# Patient Record
Sex: Female | Born: 1990 | Race: Black or African American | Hispanic: No | Marital: Single | Smoking: Never smoker
Health system: Southern US, Community
[De-identification: ages and names within clinical notes are randomized; demographics above are authoritative.]

## PROBLEM LIST (undated history)

## (undated) ENCOUNTER — Ambulatory Visit

## (undated) DIAGNOSIS — Z5189 Encounter for other specified aftercare: Secondary | ICD-10-CM

## (undated) DIAGNOSIS — R112 Nausea with vomiting, unspecified: Secondary | ICD-10-CM

## (undated) DIAGNOSIS — R251 Tremor, unspecified: Secondary | ICD-10-CM

## (undated) DIAGNOSIS — K56609 Unspecified intestinal obstruction, unspecified as to partial versus complete obstruction: Secondary | ICD-10-CM

## (undated) DIAGNOSIS — B3781 Candidal esophagitis: Secondary | ICD-10-CM

## (undated) DIAGNOSIS — I10 Essential (primary) hypertension: Secondary | ICD-10-CM

## (undated) DIAGNOSIS — N179 Acute kidney failure, unspecified: Secondary | ICD-10-CM

## (undated) DIAGNOSIS — B009 Herpesviral infection, unspecified: Secondary | ICD-10-CM

## (undated) DIAGNOSIS — K219 Gastro-esophageal reflux disease without esophagitis: Secondary | ICD-10-CM

## (undated) DIAGNOSIS — K3184 Gastroparesis: Secondary | ICD-10-CM

## (undated) DIAGNOSIS — D649 Anemia, unspecified: Secondary | ICD-10-CM

## (undated) DIAGNOSIS — Z9889 Other specified postprocedural states: Secondary | ICD-10-CM

## (undated) DIAGNOSIS — K5090 Crohn's disease, unspecified, without complications: Secondary | ICD-10-CM

## (undated) DIAGNOSIS — K611 Rectal abscess: Secondary | ICD-10-CM

## (undated) DIAGNOSIS — I471 Supraventricular tachycardia: Secondary | ICD-10-CM

## (undated) DIAGNOSIS — F419 Anxiety disorder, unspecified: Secondary | ICD-10-CM

## (undated) DIAGNOSIS — K603 Anal fistula, unspecified: Secondary | ICD-10-CM

## (undated) DIAGNOSIS — Z9289 Personal history of other medical treatment: Secondary | ICD-10-CM

## (undated) DIAGNOSIS — A4902 Methicillin resistant Staphylococcus aureus infection, unspecified site: Secondary | ICD-10-CM

## (undated) DIAGNOSIS — F329 Major depressive disorder, single episode, unspecified: Secondary | ICD-10-CM

## (undated) DIAGNOSIS — N83202 Unspecified ovarian cyst, left side: Secondary | ICD-10-CM

## (undated) DIAGNOSIS — T7840XA Allergy, unspecified, initial encounter: Secondary | ICD-10-CM

## (undated) DIAGNOSIS — D689 Coagulation defect, unspecified: Secondary | ICD-10-CM

## (undated) DIAGNOSIS — IMO0001 Reserved for inherently not codable concepts without codable children: Secondary | ICD-10-CM

## (undated) DIAGNOSIS — I82409 Acute embolism and thrombosis of unspecified deep veins of unspecified lower extremity: Secondary | ICD-10-CM

## (undated) HISTORY — DX: Encounter for other specified aftercare: Z51.89

## (undated) HISTORY — DX: Gastroparesis: K31.84

## (undated) HISTORY — DX: Anal fistula, unspecified: K60.30

## (undated) HISTORY — DX: Unspecified ovarian cyst, left side: N83.202

## (undated) HISTORY — DX: Coagulation defect, unspecified: D68.9

## (undated) HISTORY — DX: Anal fistula: K60.3

## (undated) HISTORY — DX: Unspecified intestinal obstruction, unspecified as to partial versus complete obstruction: K56.609

## (undated) HISTORY — DX: Allergy, unspecified, initial encounter: T78.40XA

## (undated) HISTORY — DX: Supraventricular tachycardia: I47.1

## (undated) HISTORY — DX: Candidal esophagitis: B37.81

## (undated) HISTORY — DX: Gastro-esophageal reflux disease without esophagitis: K21.9

## (undated) HISTORY — DX: Major depressive disorder, single episode, unspecified: F32.9

## (undated) HISTORY — DX: Reserved for inherently not codable concepts without codable children: IMO0001

## (undated) HISTORY — DX: Acute embolism and thrombosis of unspecified deep veins of unspecified lower extremity: I82.409

## (undated) HISTORY — PX: APPENDECTOMY: SHX54

## (undated) HISTORY — DX: Acute kidney failure, unspecified: N17.9

## (undated) HISTORY — DX: Anxiety disorder, unspecified: F41.9

## (undated) HISTORY — DX: Essential (primary) hypertension: I10

## (undated) HISTORY — DX: Herpesviral infection, unspecified: B00.9

## (undated) HISTORY — PX: UPPER GASTROINTESTINAL ENDOSCOPY: SHX188

## (undated) HISTORY — DX: Methicillin resistant Staphylococcus aureus infection, unspecified site: A49.02

## (undated) HISTORY — DX: Tremor, unspecified: R25.1

## (undated) HISTORY — DX: Anemia, unspecified: D64.9

---
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
## 2008-01-05 ENCOUNTER — Encounter: Payer: Self-pay | Admitting: Internal Medicine

## 2008-01-17 ENCOUNTER — Encounter: Payer: Self-pay | Admitting: Internal Medicine

## 2008-01-17 ENCOUNTER — Emergency Department (HOSPITAL_COMMUNITY): Admission: EM | Admit: 2008-01-17 | Discharge: 2008-01-17 | Payer: Self-pay | Admitting: Emergency Medicine

## 2008-01-17 ENCOUNTER — Telehealth: Payer: Self-pay | Admitting: Internal Medicine

## 2008-01-18 ENCOUNTER — Ambulatory Visit: Payer: Self-pay | Admitting: Cardiovascular Disease

## 2008-01-19 ENCOUNTER — Telehealth: Payer: Self-pay | Admitting: Internal Medicine

## 2008-01-25 DIAGNOSIS — N390 Urinary tract infection, site not specified: Secondary | ICD-10-CM

## 2008-01-26 ENCOUNTER — Ambulatory Visit: Payer: Self-pay | Admitting: Internal Medicine

## 2008-01-27 ENCOUNTER — Telehealth: Payer: Self-pay | Admitting: Internal Medicine

## 2008-01-27 ENCOUNTER — Encounter: Payer: Self-pay | Admitting: Internal Medicine

## 2008-01-31 ENCOUNTER — Encounter: Payer: Self-pay | Admitting: Internal Medicine

## 2008-01-31 ENCOUNTER — Ambulatory Visit: Payer: Self-pay | Admitting: Internal Medicine

## 2008-02-02 ENCOUNTER — Encounter: Payer: Self-pay | Admitting: Internal Medicine

## 2008-02-09 ENCOUNTER — Encounter (INDEPENDENT_AMBULATORY_CARE_PROVIDER_SITE_OTHER): Payer: Self-pay | Admitting: *Deleted

## 2008-02-09 ENCOUNTER — Encounter: Payer: Self-pay | Admitting: Internal Medicine

## 2008-02-09 ENCOUNTER — Inpatient Hospital Stay (HOSPITAL_COMMUNITY): Admission: EM | Admit: 2008-02-09 | Discharge: 2008-02-11 | Payer: Self-pay | Admitting: Emergency Medicine

## 2008-02-10 ENCOUNTER — Encounter: Payer: Self-pay | Admitting: Internal Medicine

## 2008-02-10 ENCOUNTER — Encounter (INDEPENDENT_AMBULATORY_CARE_PROVIDER_SITE_OTHER): Payer: Self-pay | Admitting: General Surgery

## 2008-02-11 ENCOUNTER — Encounter (INDEPENDENT_AMBULATORY_CARE_PROVIDER_SITE_OTHER): Payer: Self-pay | Admitting: *Deleted

## 2008-02-15 ENCOUNTER — Ambulatory Visit: Payer: Self-pay | Admitting: Internal Medicine

## 2008-02-21 ENCOUNTER — Telehealth: Payer: Self-pay | Admitting: Internal Medicine

## 2008-03-15 ENCOUNTER — Encounter: Payer: Self-pay | Admitting: Internal Medicine

## 2008-03-17 ENCOUNTER — Ambulatory Visit: Payer: Self-pay | Admitting: Internal Medicine

## 2008-03-27 ENCOUNTER — Ambulatory Visit: Payer: Self-pay | Admitting: Internal Medicine

## 2008-03-31 ENCOUNTER — Telehealth: Payer: Self-pay | Admitting: Internal Medicine

## 2008-04-13 ENCOUNTER — Telehealth: Payer: Self-pay | Admitting: Internal Medicine

## 2008-04-18 ENCOUNTER — Telehealth: Payer: Self-pay | Admitting: Internal Medicine

## 2008-05-18 ENCOUNTER — Telehealth: Payer: Self-pay | Admitting: Internal Medicine

## 2008-05-22 ENCOUNTER — Telehealth: Payer: Self-pay | Admitting: Internal Medicine

## 2008-05-22 ENCOUNTER — Ambulatory Visit: Payer: Self-pay | Admitting: Internal Medicine

## 2008-05-23 ENCOUNTER — Telehealth: Payer: Self-pay | Admitting: Internal Medicine

## 2008-05-23 LAB — CONVERTED CEMR LAB
Albumin: 3.9 g/dL (ref 3.5–5.2)
Alkaline Phosphatase: 51 units/L (ref 39–117)
BUN: 6 mg/dL (ref 6–23)
CO2: 27 meq/L (ref 19–32)
Eosinophils Relative: 0.9 % (ref 0.0–5.0)
GFR calc Af Amer: 139 mL/min
GFR calc non Af Amer: 115 mL/min
Glucose, Bld: 92 mg/dL (ref 70–99)
HCT: 36.7 % (ref 36.0–46.0)
Hemoglobin: 12.4 g/dL (ref 12.0–15.0)
Lymphocytes Relative: 39 % (ref 12.0–46.0)
Monocytes Absolute: 0.3 10*3/uL (ref 0.1–1.0)
Monocytes Relative: 6.2 % (ref 3.0–12.0)
Neutro Abs: 3 10*3/uL (ref 1.4–7.7)
Platelets: 287 10*3/uL (ref 150–400)
Potassium: 4.4 meq/L (ref 3.5–5.1)
Sed Rate: 29 mm/hr — ABNORMAL HIGH (ref 0–22)
Sodium: 145 meq/L (ref 135–145)
Total Protein: 7.1 g/dL (ref 6.0–8.3)
WBC: 5.4 10*3/uL (ref 4.5–10.5)

## 2008-05-31 ENCOUNTER — Telehealth: Payer: Self-pay | Admitting: Internal Medicine

## 2008-06-01 ENCOUNTER — Encounter (INDEPENDENT_AMBULATORY_CARE_PROVIDER_SITE_OTHER): Payer: Self-pay | Admitting: *Deleted

## 2008-06-01 ENCOUNTER — Emergency Department (HOSPITAL_COMMUNITY): Admission: EM | Admit: 2008-06-01 | Discharge: 2008-06-01 | Payer: Self-pay | Admitting: Emergency Medicine

## 2008-06-23 ENCOUNTER — Telehealth: Payer: Self-pay | Admitting: Internal Medicine

## 2008-06-23 ENCOUNTER — Encounter (INDEPENDENT_AMBULATORY_CARE_PROVIDER_SITE_OTHER): Payer: Self-pay | Admitting: *Deleted

## 2008-06-26 ENCOUNTER — Ambulatory Visit: Payer: Self-pay | Admitting: Internal Medicine

## 2008-08-07 ENCOUNTER — Telehealth: Payer: Self-pay | Admitting: Internal Medicine

## 2008-08-25 ENCOUNTER — Telehealth: Payer: Self-pay | Admitting: Internal Medicine

## 2008-09-01 ENCOUNTER — Ambulatory Visit: Payer: Self-pay | Admitting: Internal Medicine

## 2008-09-20 ENCOUNTER — Telehealth: Payer: Self-pay | Admitting: Internal Medicine

## 2008-09-20 ENCOUNTER — Encounter: Payer: Self-pay | Admitting: Internal Medicine

## 2008-10-15 ENCOUNTER — Emergency Department (HOSPITAL_COMMUNITY): Admission: EM | Admit: 2008-10-15 | Discharge: 2008-10-15 | Payer: Self-pay | Admitting: Emergency Medicine

## 2008-11-01 ENCOUNTER — Emergency Department (HOSPITAL_COMMUNITY): Admission: EM | Admit: 2008-11-01 | Discharge: 2008-11-01 | Payer: Self-pay | Admitting: Internal Medicine

## 2008-11-07 ENCOUNTER — Telehealth: Payer: Self-pay | Admitting: Internal Medicine

## 2008-12-06 ENCOUNTER — Telehealth: Payer: Self-pay | Admitting: Internal Medicine

## 2008-12-19 ENCOUNTER — Ambulatory Visit: Payer: Self-pay | Admitting: Internal Medicine

## 2008-12-19 DIAGNOSIS — A6000 Herpesviral infection of urogenital system, unspecified: Secondary | ICD-10-CM | POA: Insufficient documentation

## 2008-12-20 ENCOUNTER — Ambulatory Visit: Payer: Self-pay | Admitting: Internal Medicine

## 2008-12-20 ENCOUNTER — Encounter: Payer: Self-pay | Admitting: Internal Medicine

## 2008-12-20 LAB — CONVERTED CEMR LAB
ALT: 14 units/L (ref 0–35)
AST: 20 units/L (ref 0–37)
Albumin: 3.4 g/dL — ABNORMAL LOW (ref 3.5–5.2)
Basophils Absolute: 0 10*3/uL (ref 0.0–0.1)
CO2: 29 meq/L (ref 19–32)
Calcium: 8.8 mg/dL (ref 8.4–10.5)
Chloride: 111 meq/L (ref 96–112)
Creatinine, Ser: 0.6 mg/dL (ref 0.4–1.2)
Eosinophils Absolute: 0 10*3/uL (ref 0.0–0.7)
GFR calc non Af Amer: 164.12 mL/min (ref >60)
Hemoglobin: 12.1 g/dL (ref 12.0–15.0)
Iron: 22 ug/dL — ABNORMAL LOW (ref 42–145)
Lymphocytes Relative: 29.4 % (ref 12.0–46.0)
MCHC: 34.1 g/dL (ref 30.0–36.0)
Monocytes Relative: 10 % (ref 3.0–12.0)
Neutrophils Relative %: 59.6 % (ref 43.0–77.0)
Platelets: 343 10*3/uL (ref 150.0–400.0)
Potassium: 3.7 meq/L (ref 3.5–5.1)
RDW: 13.9 % (ref 11.5–14.6)
Sed Rate: 31 mm/hr — ABNORMAL HIGH (ref 0–22)
Sodium: 142 meq/L (ref 135–145)
Total Protein: 6.8 g/dL (ref 6.0–8.3)

## 2008-12-22 ENCOUNTER — Encounter: Payer: Self-pay | Admitting: Internal Medicine

## 2009-02-09 ENCOUNTER — Ambulatory Visit: Payer: Self-pay | Admitting: Internal Medicine

## 2009-02-19 ENCOUNTER — Encounter: Payer: Self-pay | Admitting: Internal Medicine

## 2009-03-16 ENCOUNTER — Telehealth: Payer: Self-pay | Admitting: Internal Medicine

## 2009-03-20 ENCOUNTER — Telehealth: Payer: Self-pay | Admitting: Internal Medicine

## 2009-03-22 ENCOUNTER — Ambulatory Visit: Payer: Self-pay | Admitting: Internal Medicine

## 2009-03-22 ENCOUNTER — Encounter: Payer: Self-pay | Admitting: Internal Medicine

## 2009-03-22 LAB — CONVERTED CEMR LAB
ALT: 15 units/L (ref 0–35)
AST: 18 units/L (ref 0–37)
BUN: 8 mg/dL (ref 6–23)
Basophils Relative: 1.4 % (ref 0.0–3.0)
Bilirubin, Direct: 0.1 mg/dL (ref 0.0–0.3)
Calcium: 8.8 mg/dL (ref 8.4–10.5)
Eosinophils Relative: 0.8 % (ref 0.0–5.0)
GFR calc non Af Amer: 137.02 mL/min (ref >60)
Glucose, Bld: 95 mg/dL (ref 70–99)
HCT: 34.4 % — ABNORMAL LOW (ref 36.0–46.0)
Lymphs Abs: 1.4 10*3/uL (ref 0.7–4.0)
Monocytes Relative: 7.5 % (ref 3.0–12.0)
Neutrophils Relative %: 67 % (ref 43.0–77.0)
Platelets: 354 10*3/uL (ref 150.0–400.0)
RBC: 3.91 M/uL (ref 3.87–5.11)
Total Bilirubin: 0.5 mg/dL (ref 0.3–1.2)
Total Protein: 6.6 g/dL (ref 6.0–8.3)
WBC: 6.2 10*3/uL (ref 4.5–10.5)

## 2009-07-14 DIAGNOSIS — I471 Supraventricular tachycardia, unspecified: Secondary | ICD-10-CM

## 2009-07-14 DIAGNOSIS — Z9289 Personal history of other medical treatment: Secondary | ICD-10-CM

## 2009-07-14 DIAGNOSIS — F419 Anxiety disorder, unspecified: Secondary | ICD-10-CM

## 2009-07-14 DIAGNOSIS — I10 Essential (primary) hypertension: Secondary | ICD-10-CM

## 2009-07-14 DIAGNOSIS — F32A Depression, unspecified: Secondary | ICD-10-CM

## 2009-07-14 DIAGNOSIS — K56609 Unspecified intestinal obstruction, unspecified as to partial versus complete obstruction: Secondary | ICD-10-CM

## 2009-07-14 HISTORY — DX: Supraventricular tachycardia: I47.1

## 2009-07-14 HISTORY — DX: Depression, unspecified: F32.A

## 2009-07-14 HISTORY — DX: Personal history of other medical treatment: Z92.89

## 2009-07-14 HISTORY — DX: Anxiety disorder, unspecified: F41.9

## 2009-07-14 HISTORY — DX: Essential (primary) hypertension: I10

## 2009-07-14 HISTORY — DX: Supraventricular tachycardia, unspecified: I47.10

## 2009-07-14 HISTORY — DX: Unspecified intestinal obstruction, unspecified as to partial versus complete obstruction: K56.609

## 2009-07-14 HISTORY — PX: OTHER SURGICAL HISTORY: SHX169

## 2009-09-19 ENCOUNTER — Ambulatory Visit: Payer: Self-pay | Admitting: Internal Medicine

## 2009-09-19 ENCOUNTER — Telehealth: Payer: Self-pay | Admitting: Internal Medicine

## 2009-09-19 LAB — CONVERTED CEMR LAB
ALT: 16 units/L (ref 0–35)
Basophils Absolute: 0 10*3/uL (ref 0.0–0.1)
CO2: 30 meq/L (ref 19–32)
Calcium: 9 mg/dL (ref 8.4–10.5)
Chloride: 109 meq/L (ref 96–112)
GFR calc non Af Amer: 162.89 mL/min (ref >60)
Lymphocytes Relative: 14.5 % (ref 12.0–46.0)
Monocytes Relative: 6.6 % (ref 3.0–12.0)
Neutrophils Relative %: 77.6 % — ABNORMAL HIGH (ref 43.0–77.0)
Platelets: 411 10*3/uL — ABNORMAL HIGH (ref 150.0–400.0)
Potassium: 3.7 meq/L (ref 3.5–5.1)
RDW: 15.4 % — ABNORMAL HIGH (ref 11.5–14.6)
Sed Rate: 60 mm/hr — ABNORMAL HIGH (ref 0–22)
Sodium: 141 meq/L (ref 135–145)
Total Bilirubin: 0.2 mg/dL — ABNORMAL LOW (ref 0.3–1.2)
Total Protein: 7.3 g/dL (ref 6.0–8.3)

## 2009-09-21 ENCOUNTER — Ambulatory Visit: Payer: Self-pay | Admitting: Oncology

## 2009-10-12 ENCOUNTER — Encounter: Payer: Self-pay | Admitting: Internal Medicine

## 2009-10-12 LAB — CBC WITH DIFFERENTIAL/PLATELET
BASO%: 0.3 % (ref 0.0–2.0)
EOS%: 3.8 % (ref 0.0–7.0)
HCT: 35.3 % (ref 34.8–46.6)
MCH: 26.7 pg (ref 25.1–34.0)
MCHC: 32.5 g/dL (ref 31.5–36.0)
NEUT%: 67.7 % (ref 38.4–76.8)
RBC: 4.28 10*6/uL (ref 3.70–5.45)
RDW: 16.4 % — ABNORMAL HIGH (ref 11.2–14.5)
WBC: 5.4 10*3/uL (ref 3.9–10.3)
lymph#: 1.1 10*3/uL (ref 0.9–3.3)

## 2009-10-12 LAB — CHCC SMEAR

## 2009-10-14 LAB — TRANSFERRIN RECEPTOR, SOLUABLE: Transferrin Receptor, Soluble: 45.1 nmol/L

## 2009-10-14 LAB — VITAMIN B12: Vitamin B-12: 499 pg/mL (ref 211–911)

## 2009-10-14 LAB — IRON AND TIBC: Iron: 23 ug/dL — ABNORMAL LOW (ref 42–145)

## 2009-10-26 ENCOUNTER — Telehealth: Payer: Self-pay | Admitting: Internal Medicine

## 2009-11-05 ENCOUNTER — Telehealth: Payer: Self-pay | Admitting: Internal Medicine

## 2009-12-07 ENCOUNTER — Ambulatory Visit: Payer: Self-pay | Admitting: Internal Medicine

## 2009-12-19 ENCOUNTER — Telehealth: Payer: Self-pay | Admitting: Internal Medicine

## 2009-12-20 ENCOUNTER — Ambulatory Visit: Payer: Self-pay | Admitting: Gastroenterology

## 2010-01-04 ENCOUNTER — Telehealth: Payer: Self-pay | Admitting: Internal Medicine

## 2010-01-15 ENCOUNTER — Ambulatory Visit: Payer: Self-pay | Admitting: Oncology

## 2010-01-17 ENCOUNTER — Telehealth: Payer: Self-pay | Admitting: Internal Medicine

## 2010-01-18 ENCOUNTER — Ambulatory Visit: Payer: Self-pay | Admitting: Internal Medicine

## 2010-02-14 ENCOUNTER — Ambulatory Visit: Payer: Self-pay | Admitting: Oncology

## 2010-02-15 ENCOUNTER — Ambulatory Visit: Payer: Self-pay | Admitting: Gastroenterology

## 2010-02-18 ENCOUNTER — Telehealth: Payer: Self-pay | Admitting: Internal Medicine

## 2010-02-18 ENCOUNTER — Encounter: Payer: Self-pay | Admitting: Internal Medicine

## 2010-02-18 LAB — CBC WITH DIFFERENTIAL/PLATELET
BASO%: 0.3 % (ref 0.0–2.0)
EOS%: 1.8 % (ref 0.0–7.0)
HCT: 32.5 % — ABNORMAL LOW (ref 34.8–46.6)
LYMPH%: 18.9 % (ref 14.0–49.7)
MCH: 25.6 pg (ref 25.1–34.0)
MCHC: 32.3 g/dL (ref 31.5–36.0)
MCV: 79.4 fL — ABNORMAL LOW (ref 79.5–101.0)
MONO%: 7.7 % (ref 0.0–14.0)
NEUT%: 71.3 % (ref 38.4–76.8)
Platelets: 451 10*3/uL — ABNORMAL HIGH (ref 145–400)
RBC: 4.09 10*6/uL (ref 3.70–5.45)

## 2010-02-19 ENCOUNTER — Encounter (INDEPENDENT_AMBULATORY_CARE_PROVIDER_SITE_OTHER): Payer: Self-pay | Admitting: *Deleted

## 2010-02-20 ENCOUNTER — Ambulatory Visit: Payer: Self-pay | Admitting: Internal Medicine

## 2010-02-28 ENCOUNTER — Ambulatory Visit: Payer: Self-pay | Admitting: Internal Medicine

## 2010-02-28 LAB — HM SIGMOIDOSCOPY

## 2010-03-01 ENCOUNTER — Encounter: Payer: Self-pay | Admitting: Internal Medicine

## 2010-03-04 ENCOUNTER — Encounter: Payer: Self-pay | Admitting: Internal Medicine

## 2010-04-04 ENCOUNTER — Emergency Department (HOSPITAL_COMMUNITY): Admission: EM | Admit: 2010-04-04 | Discharge: 2010-04-04 | Payer: Self-pay | Admitting: Emergency Medicine

## 2010-04-04 ENCOUNTER — Encounter: Payer: Self-pay | Admitting: Internal Medicine

## 2010-04-05 ENCOUNTER — Telehealth: Payer: Self-pay | Admitting: Internal Medicine

## 2010-04-05 ENCOUNTER — Encounter: Payer: Self-pay | Admitting: Internal Medicine

## 2010-04-12 ENCOUNTER — Ambulatory Visit: Payer: Self-pay | Admitting: Internal Medicine

## 2010-04-12 ENCOUNTER — Inpatient Hospital Stay (HOSPITAL_COMMUNITY): Admission: AD | Admit: 2010-04-12 | Discharge: 2010-04-18 | Payer: Self-pay | Admitting: Internal Medicine

## 2010-04-13 ENCOUNTER — Encounter (INDEPENDENT_AMBULATORY_CARE_PROVIDER_SITE_OTHER): Payer: Self-pay | Admitting: *Deleted

## 2010-04-13 ENCOUNTER — Encounter: Payer: Self-pay | Admitting: Internal Medicine

## 2010-04-13 DIAGNOSIS — A4902 Methicillin resistant Staphylococcus aureus infection, unspecified site: Secondary | ICD-10-CM

## 2010-04-13 HISTORY — DX: Methicillin resistant Staphylococcus aureus infection, unspecified site: A49.02

## 2010-04-18 ENCOUNTER — Encounter: Payer: Self-pay | Admitting: Internal Medicine

## 2010-04-19 ENCOUNTER — Telehealth: Payer: Self-pay | Admitting: Internal Medicine

## 2010-04-19 ENCOUNTER — Encounter: Payer: Self-pay | Admitting: Internal Medicine

## 2010-04-22 ENCOUNTER — Telehealth: Payer: Self-pay | Admitting: Internal Medicine

## 2010-04-22 ENCOUNTER — Inpatient Hospital Stay (HOSPITAL_COMMUNITY)
Admission: AD | Admit: 2010-04-22 | Discharge: 2010-05-28 | Payer: Self-pay | Source: Home / Self Care | Admitting: Internal Medicine

## 2010-04-22 ENCOUNTER — Ambulatory Visit: Payer: Self-pay | Admitting: Internal Medicine

## 2010-04-22 ENCOUNTER — Ambulatory Visit: Payer: Self-pay | Admitting: Cardiovascular Disease

## 2010-04-29 ENCOUNTER — Encounter (INDEPENDENT_AMBULATORY_CARE_PROVIDER_SITE_OTHER): Payer: Self-pay | Admitting: *Deleted

## 2010-04-30 ENCOUNTER — Encounter (INDEPENDENT_AMBULATORY_CARE_PROVIDER_SITE_OTHER): Payer: Self-pay | Admitting: *Deleted

## 2010-05-01 ENCOUNTER — Encounter: Payer: Self-pay | Admitting: Internal Medicine

## 2010-05-02 ENCOUNTER — Encounter (INDEPENDENT_AMBULATORY_CARE_PROVIDER_SITE_OTHER): Payer: Self-pay | Admitting: *Deleted

## 2010-05-02 ENCOUNTER — Encounter (INDEPENDENT_AMBULATORY_CARE_PROVIDER_SITE_OTHER): Payer: Self-pay

## 2010-05-03 ENCOUNTER — Telehealth: Payer: Self-pay | Admitting: Internal Medicine

## 2010-05-05 ENCOUNTER — Encounter (INDEPENDENT_AMBULATORY_CARE_PROVIDER_SITE_OTHER): Payer: Self-pay | Admitting: *Deleted

## 2010-05-06 ENCOUNTER — Encounter (INDEPENDENT_AMBULATORY_CARE_PROVIDER_SITE_OTHER): Payer: Self-pay | Admitting: *Deleted

## 2010-05-06 ENCOUNTER — Encounter: Payer: Self-pay | Admitting: Cardiovascular Disease

## 2010-05-12 ENCOUNTER — Encounter (INDEPENDENT_AMBULATORY_CARE_PROVIDER_SITE_OTHER): Payer: Self-pay | Admitting: *Deleted

## 2010-05-14 ENCOUNTER — Encounter (INDEPENDENT_AMBULATORY_CARE_PROVIDER_SITE_OTHER): Payer: Self-pay | Admitting: *Deleted

## 2010-05-16 ENCOUNTER — Encounter (INDEPENDENT_AMBULATORY_CARE_PROVIDER_SITE_OTHER): Payer: Self-pay | Admitting: *Deleted

## 2010-05-17 ENCOUNTER — Ambulatory Visit: Payer: Self-pay | Admitting: Oncology

## 2010-05-18 ENCOUNTER — Encounter (INDEPENDENT_AMBULATORY_CARE_PROVIDER_SITE_OTHER): Payer: Self-pay | Admitting: *Deleted

## 2010-05-20 ENCOUNTER — Encounter (INDEPENDENT_AMBULATORY_CARE_PROVIDER_SITE_OTHER): Payer: Self-pay | Admitting: *Deleted

## 2010-05-25 ENCOUNTER — Encounter (INDEPENDENT_AMBULATORY_CARE_PROVIDER_SITE_OTHER): Payer: Self-pay | Admitting: *Deleted

## 2010-05-28 ENCOUNTER — Telehealth: Payer: Self-pay | Admitting: Internal Medicine

## 2010-05-29 ENCOUNTER — Telehealth: Payer: Self-pay | Admitting: Internal Medicine

## 2010-05-31 ENCOUNTER — Encounter: Payer: Self-pay | Admitting: Internal Medicine

## 2010-05-31 LAB — CONVERTED CEMR LAB
Hemoglobin: 11.2 g/dL
Platelets: 450 10*3/uL

## 2010-06-04 ENCOUNTER — Telehealth: Payer: Self-pay | Admitting: Internal Medicine

## 2010-06-04 ENCOUNTER — Encounter (INDEPENDENT_AMBULATORY_CARE_PROVIDER_SITE_OTHER): Payer: Self-pay | Admitting: *Deleted

## 2010-06-05 ENCOUNTER — Ambulatory Visit: Payer: Self-pay | Admitting: Psychiatry

## 2010-06-05 ENCOUNTER — Inpatient Hospital Stay (HOSPITAL_COMMUNITY)
Admission: EM | Admit: 2010-06-05 | Discharge: 2010-06-06 | Payer: Self-pay | Source: Home / Self Care | Admitting: Emergency Medicine

## 2010-06-05 ENCOUNTER — Ambulatory Visit: Payer: Self-pay | Admitting: Internal Medicine

## 2010-06-05 ENCOUNTER — Ambulatory Visit: Payer: Self-pay | Admitting: Gastroenterology

## 2010-06-12 ENCOUNTER — Encounter: Payer: Self-pay | Admitting: Internal Medicine

## 2010-06-12 ENCOUNTER — Ambulatory Visit: Payer: Self-pay | Admitting: Cardiovascular Disease

## 2010-06-13 ENCOUNTER — Telehealth: Payer: Self-pay | Admitting: Internal Medicine

## 2010-06-17 ENCOUNTER — Telehealth: Payer: Self-pay | Admitting: Internal Medicine

## 2010-06-19 ENCOUNTER — Encounter: Payer: Self-pay | Admitting: Internal Medicine

## 2010-06-20 ENCOUNTER — Emergency Department (HOSPITAL_COMMUNITY): Admission: EM | Admit: 2010-06-20 | Discharge: 2009-08-12 | Payer: Self-pay | Admitting: Emergency Medicine

## 2010-06-21 ENCOUNTER — Encounter (INDEPENDENT_AMBULATORY_CARE_PROVIDER_SITE_OTHER): Payer: Self-pay | Admitting: *Deleted

## 2010-06-21 ENCOUNTER — Telehealth: Payer: Self-pay | Admitting: Internal Medicine

## 2010-06-21 ENCOUNTER — Emergency Department (HOSPITAL_COMMUNITY)
Admission: EM | Admit: 2010-06-21 | Discharge: 2010-06-22 | Payer: Self-pay | Source: Home / Self Care | Admitting: Emergency Medicine

## 2010-06-22 ENCOUNTER — Encounter (INDEPENDENT_AMBULATORY_CARE_PROVIDER_SITE_OTHER): Payer: Self-pay | Admitting: *Deleted

## 2010-06-26 ENCOUNTER — Telehealth: Payer: Self-pay | Admitting: Internal Medicine

## 2010-06-27 ENCOUNTER — Ambulatory Visit: Payer: Self-pay | Admitting: Internal Medicine

## 2010-06-27 LAB — CONVERTED CEMR LAB
Basophils Relative: 0.2 % (ref 0.0–3.0)
Eosinophils Absolute: 0.3 10*3/uL (ref 0.0–0.7)
Eosinophils Relative: 3.4 % (ref 0.0–5.0)
Hemoglobin: 11.2 g/dL — ABNORMAL LOW (ref 12.0–15.0)
Lymphs Abs: 2.6 10*3/uL (ref 0.7–4.0)
Monocytes Absolute: 0.8 10*3/uL (ref 0.1–1.0)
Neutrophils Relative %: 57.1 % (ref 43.0–77.0)
Platelets: 449 10*3/uL — ABNORMAL HIGH (ref 150.0–400.0)
RDW: 20.3 % — ABNORMAL HIGH (ref 11.5–14.6)
Transferrin: 455.4 mg/dL — ABNORMAL HIGH (ref 212.0–360.0)

## 2010-07-04 ENCOUNTER — Emergency Department (HOSPITAL_COMMUNITY)
Admission: EM | Admit: 2010-07-04 | Discharge: 2010-07-04 | Payer: Self-pay | Source: Home / Self Care | Admitting: Emergency Medicine

## 2010-07-04 ENCOUNTER — Telehealth: Payer: Self-pay | Admitting: Internal Medicine

## 2010-07-12 ENCOUNTER — Encounter: Payer: Self-pay | Admitting: Internal Medicine

## 2010-07-16 ENCOUNTER — Telehealth: Payer: Self-pay | Admitting: Internal Medicine

## 2010-07-16 ENCOUNTER — Ambulatory Visit
Admission: RE | Admit: 2010-07-16 | Discharge: 2010-07-16 | Payer: Self-pay | Source: Home / Self Care | Attending: Internal Medicine | Admitting: Internal Medicine

## 2010-07-17 ENCOUNTER — Encounter: Payer: Self-pay | Admitting: Internal Medicine

## 2010-07-17 ENCOUNTER — Encounter
Admission: RE | Admit: 2010-07-17 | Discharge: 2010-07-17 | Payer: Self-pay | Source: Home / Self Care | Attending: Surgery | Admitting: Surgery

## 2010-07-22 DIAGNOSIS — Z432 Encounter for attention to ileostomy: Secondary | ICD-10-CM

## 2010-07-22 DIAGNOSIS — Z48815 Encounter for surgical aftercare following surgery on the digestive system: Secondary | ICD-10-CM

## 2010-07-22 DIAGNOSIS — Z48814 Encounter for surgical aftercare following surgery on the teeth or oral cavity: Secondary | ICD-10-CM

## 2010-07-22 DIAGNOSIS — E119 Type 2 diabetes mellitus without complications: Secondary | ICD-10-CM

## 2010-07-25 ENCOUNTER — Encounter (INDEPENDENT_AMBULATORY_CARE_PROVIDER_SITE_OTHER): Payer: Self-pay | Admitting: Surgery

## 2010-07-25 ENCOUNTER — Encounter: Payer: Self-pay | Admitting: Internal Medicine

## 2010-07-25 ENCOUNTER — Inpatient Hospital Stay (HOSPITAL_COMMUNITY)
Admission: RE | Admit: 2010-07-25 | Discharge: 2010-08-27 | DRG: 344 | Disposition: A | Discharge status: Home / Self Care | Payer: Self-pay | Attending: Surgery | Admitting: Surgery

## 2010-07-25 DIAGNOSIS — T8183XA Persistent postprocedural fistula, initial encounter: Secondary | ICD-10-CM | POA: Diagnosis not present

## 2010-07-25 DIAGNOSIS — D509 Iron deficiency anemia, unspecified: Secondary | ICD-10-CM | POA: Diagnosis present

## 2010-07-25 DIAGNOSIS — K651 Peritoneal abscess: Secondary | ICD-10-CM | POA: Diagnosis not present

## 2010-07-25 DIAGNOSIS — Y833 Surgical operation with formation of external stoma as the cause of abnormal reaction of the patient, or of later complication, without mention of misadventure at the time of the procedure: Secondary | ICD-10-CM | POA: Diagnosis not present

## 2010-07-25 DIAGNOSIS — K219 Gastro-esophageal reflux disease without esophagitis: Secondary | ICD-10-CM | POA: Diagnosis present

## 2010-07-25 DIAGNOSIS — E43 Unspecified severe protein-calorie malnutrition: Secondary | ICD-10-CM | POA: Diagnosis not present

## 2010-07-25 DIAGNOSIS — Z432 Encounter for attention to ileostomy: Principal | ICD-10-CM

## 2010-07-25 DIAGNOSIS — IMO0002 Reserved for concepts with insufficient information to code with codable children: Secondary | ICD-10-CM

## 2010-07-25 DIAGNOSIS — I498 Other specified cardiac arrhythmias: Secondary | ICD-10-CM | POA: Diagnosis not present

## 2010-07-25 DIAGNOSIS — K5090 Crohn's disease, unspecified, without complications: Secondary | ICD-10-CM | POA: Diagnosis present

## 2010-07-25 DIAGNOSIS — K0530 Chronic periodontitis, unspecified: Secondary | ICD-10-CM | POA: Diagnosis present

## 2010-07-25 HISTORY — DX: Crohn's disease, unspecified, without complications: K50.90

## 2010-07-26 ENCOUNTER — Encounter (INDEPENDENT_AMBULATORY_CARE_PROVIDER_SITE_OTHER): Payer: Self-pay | Admitting: *Deleted

## 2010-07-29 ENCOUNTER — Encounter (INDEPENDENT_AMBULATORY_CARE_PROVIDER_SITE_OTHER): Payer: Self-pay | Admitting: *Deleted

## 2010-07-29 LAB — DIFFERENTIAL
Basophils Absolute: 0 10*3/uL (ref 0.0–0.1)
Basophils Relative: 1 % (ref 0–1)
Eosinophils Absolute: 0.2 10*3/uL (ref 0.0–0.7)
Eosinophils Relative: 4 % (ref 0–5)
Lymphocytes Relative: 28 % (ref 12–46)
Lymphs Abs: 1.7 10*3/uL (ref 0.7–4.0)
Monocytes Absolute: 0.8 10*3/uL (ref 0.1–1.0)
Monocytes Relative: 12 % (ref 3–12)
Neutro Abs: 3.6 10*3/uL (ref 1.7–7.7)
Neutrophils Relative %: 57 % (ref 43–77)

## 2010-07-29 LAB — COMPREHENSIVE METABOLIC PANEL
ALT: 41 U/L — ABNORMAL HIGH (ref 0–35)
ALT: 37 U/L — ABNORMAL HIGH (ref 0–35)
ALT: 32 U/L (ref 0–35)
AST: 29 U/L (ref 0–37)
AST: 33 U/L (ref 0–37)
AST: 27 U/L (ref 0–37)
Albumin: 3.3 g/dL — ABNORMAL LOW (ref 3.5–5.2)
Albumin: 2.5 g/dL — ABNORMAL LOW (ref 3.5–5.2)
Albumin: 2.6 g/dL — ABNORMAL LOW (ref 3.5–5.2)
Alkaline Phosphatase: 66 U/L (ref 39–117)
Alkaline Phosphatase: 43 U/L (ref 39–117)
Alkaline Phosphatase: 51 U/L (ref 39–117)
BUN: 4 mg/dL — ABNORMAL LOW (ref 6–23)
BUN: 2 mg/dL — ABNORMAL LOW (ref 6–23)
BUN: <1 mg/dL — ABNORMAL LOW (ref 6–23)
CO2: 25 mEq/L (ref 19–32)
CO2: 28 mEq/L (ref 19–32)
CO2: 28 mEq/L (ref 19–32)
Calcium: 9.4 mg/dL (ref 8.4–10.5)
Calcium: 8.3 mg/dL — ABNORMAL LOW (ref 8.4–10.5)
Calcium: 8.5 mg/dL (ref 8.4–10.5)
Chloride: 107 mEq/L (ref 96–112)
Chloride: 104 mEq/L (ref 96–112)
Chloride: 103 mEq/L (ref 96–112)
Creatinine, Ser: 0.63 mg/dL (ref 0.4–1.2)
Creatinine, Ser: 0.58 mg/dL (ref 0.4–1.2)
Creatinine, Ser: 0.58 mg/dL (ref 0.4–1.2)
GFR calc Af Amer: >60 mL/min (ref >60)
GFR calc Af Amer: >60 mL/min (ref >60)
GFR calc Af Amer: >60 mL/min (ref >60)
GFR calc non Af Amer: >60 mL/min (ref >60)
GFR calc non Af Amer: >60 mL/min (ref >60)
GFR calc non Af Amer: >60 mL/min (ref >60)
Glucose, Bld: 84 mg/dL (ref 70–99)
Glucose, Bld: 105 mg/dL — ABNORMAL HIGH (ref 70–99)
Glucose, Bld: 94 mg/dL (ref 70–99)
Potassium: 4.2 mEq/L (ref 3.5–5.1)
Potassium: 3.3 mEq/L — ABNORMAL LOW (ref 3.5–5.1)
Potassium: 3.3 mEq/L — ABNORMAL LOW (ref 3.5–5.1)
Sodium: 139 mEq/L (ref 135–145)
Sodium: 140 mEq/L (ref 135–145)
Sodium: 139 mEq/L (ref 135–145)
Total Bilirubin: 0.3 mg/dL (ref 0.3–1.2)
Total Bilirubin: 0.3 mg/dL (ref 0.3–1.2)
Total Bilirubin: 0.3 mg/dL (ref 0.3–1.2)
Total Protein: 6.7 g/dL (ref 6.0–8.3)
Total Protein: 4.8 g/dL — ABNORMAL LOW (ref 6.0–8.3)
Total Protein: 5.2 g/dL — ABNORMAL LOW (ref 6.0–8.3)

## 2010-07-29 LAB — CBC
HCT: 33 % — ABNORMAL LOW (ref 36.0–46.0)
HCT: 26.5 % — ABNORMAL LOW (ref 36.0–46.0)
HCT: 28.7 % — ABNORMAL LOW (ref 36.0–46.0)
HCT: 30.5 % — ABNORMAL LOW (ref 36.0–46.0)
Hemoglobin: 10.1 g/dL — ABNORMAL LOW (ref 12.0–15.0)
Hemoglobin: 7.9 g/dL — ABNORMAL LOW (ref 12.0–15.0)
Hemoglobin: 8.7 g/dL — ABNORMAL LOW (ref 12.0–15.0)
Hemoglobin: 9.2 g/dL — ABNORMAL LOW (ref 12.0–15.0)
MCH: 23.5 pg — ABNORMAL LOW (ref 26.0–34.0)
MCH: 23 pg — ABNORMAL LOW (ref 26.0–34.0)
MCH: 23.6 pg — ABNORMAL LOW (ref 26.0–34.0)
MCH: 23.4 pg — ABNORMAL LOW (ref 26.0–34.0)
MCHC: 30.6 g/dL (ref 30.0–36.0)
MCHC: 29.8 g/dL — ABNORMAL LOW (ref 30.0–36.0)
MCHC: 30.3 g/dL (ref 30.0–36.0)
MCHC: 30.2 g/dL (ref 30.0–36.0)
MCV: 76.9 fL — ABNORMAL LOW (ref 78.0–100.0)
MCV: 77.3 fL — ABNORMAL LOW (ref 78.0–100.0)
MCV: 77.8 fL — ABNORMAL LOW (ref 78.0–100.0)
MCV: 77.6 fL — ABNORMAL LOW (ref 78.0–100.0)
Platelets: 444 10*3/uL — ABNORMAL HIGH (ref 150–400)
Platelets: 356 10*3/uL (ref 150–400)
Platelets: 383 10*3/uL (ref 150–400)
Platelets: 398 10*3/uL (ref 150–400)
RBC: 4.29 MIL/uL (ref 3.87–5.11)
RBC: 3.43 MIL/uL — ABNORMAL LOW (ref 3.87–5.11)
RBC: 3.69 MIL/uL — ABNORMAL LOW (ref 3.87–5.11)
RBC: 3.93 MIL/uL (ref 3.87–5.11)
RDW: 17.6 % — ABNORMAL HIGH (ref 11.5–15.5)
RDW: 17.6 % — ABNORMAL HIGH (ref 11.5–15.5)
RDW: 17.9 % — ABNORMAL HIGH (ref 11.5–15.5)
RDW: 17.5 % — ABNORMAL HIGH (ref 11.5–15.5)
WBC: 6.3 10*3/uL (ref 4.0–10.5)
WBC: 7.7 10*3/uL (ref 4.0–10.5)
WBC: 7.8 10*3/uL (ref 4.0–10.5)
WBC: 11.8 10*3/uL — ABNORMAL HIGH (ref 4.0–10.5)

## 2010-07-29 LAB — BASIC METABOLIC PANEL
BUN: <1 mg/dL — ABNORMAL LOW (ref 6–23)
CO2: 30 mEq/L (ref 19–32)
Calcium: 8.3 mg/dL — ABNORMAL LOW (ref 8.4–10.5)
Chloride: 101 mEq/L (ref 96–112)
Creatinine, Ser: 0.56 mg/dL (ref 0.4–1.2)
GFR calc Af Amer: >60 mL/min (ref >60)
GFR calc non Af Amer: >60 mL/min (ref >60)
Glucose, Bld: 107 mg/dL — ABNORMAL HIGH (ref 70–99)
Potassium: 3.3 mEq/L — ABNORMAL LOW (ref 3.5–5.1)
Sodium: 138 mEq/L (ref 135–145)

## 2010-07-29 LAB — SURGICAL PCR SCREEN
MRSA, PCR: NEGATIVE
Staphylococcus aureus: NEGATIVE

## 2010-07-30 ENCOUNTER — Telehealth: Payer: Self-pay | Admitting: Internal Medicine

## 2010-07-31 LAB — BASIC METABOLIC PANEL
BUN: <1 mg/dL — ABNORMAL LOW (ref 6–23)
BUN: 3 mg/dL — ABNORMAL LOW (ref 6–23)
CO2: 29 mEq/L (ref 19–32)
CO2: 27 mEq/L (ref 19–32)
Calcium: 8.2 mg/dL — ABNORMAL LOW (ref 8.4–10.5)
Calcium: 7.6 mg/dL — ABNORMAL LOW (ref 8.4–10.5)
Chloride: 101 mEq/L (ref 96–112)
Chloride: 101 mEq/L (ref 96–112)
Creatinine, Ser: 0.57 mg/dL (ref 0.4–1.2)
Creatinine, Ser: 0.65 mg/dL (ref 0.4–1.2)
GFR calc Af Amer: >60 mL/min (ref >60)
GFR calc Af Amer: >60 mL/min (ref >60)
GFR calc non Af Amer: >60 mL/min (ref >60)
GFR calc non Af Amer: >60 mL/min (ref >60)
Glucose, Bld: 143 mg/dL — ABNORMAL HIGH (ref 70–99)
Glucose, Bld: 111 mg/dL — ABNORMAL HIGH (ref 70–99)
Potassium: 3.6 mEq/L (ref 3.5–5.1)
Potassium: 4.1 mEq/L (ref 3.5–5.1)
Sodium: 137 mEq/L (ref 135–145)
Sodium: 136 mEq/L (ref 135–145)

## 2010-07-31 LAB — URINALYSIS, ROUTINE W REFLEX MICROSCOPIC
Bilirubin Urine: NEGATIVE
Hgb urine dipstick: NEGATIVE
Ketones, ur: NEGATIVE mg/dL
Nitrite: NEGATIVE
Protein, ur: NEGATIVE mg/dL
Specific Gravity, Urine: 1.015 (ref 1.005–1.030)
Urine Glucose, Fasting: NEGATIVE mg/dL
Urobilinogen, UA: 1 mg/dL (ref 0.0–1.0)
pH: 6 (ref 5.0–8.0)

## 2010-07-31 LAB — CBC
HCT: 31.8 % — ABNORMAL LOW (ref 36.0–46.0)
HCT: 29.9 % — ABNORMAL LOW (ref 36.0–46.0)
Hemoglobin: 9.4 g/dL — ABNORMAL LOW (ref 12.0–15.0)
Hemoglobin: 9.2 g/dL — ABNORMAL LOW (ref 12.0–15.0)
MCH: 23.2 pg — ABNORMAL LOW (ref 26.0–34.0)
MCH: 23.5 pg — ABNORMAL LOW (ref 26.0–34.0)
MCHC: 29.6 g/dL — ABNORMAL LOW (ref 30.0–36.0)
MCHC: 30.8 g/dL (ref 30.0–36.0)
MCV: 78.5 fL (ref 78.0–100.0)
MCV: 76.5 fL — ABNORMAL LOW (ref 78.0–100.0)
Platelets: 483 10*3/uL — ABNORMAL HIGH (ref 150–400)
Platelets: 420 10*3/uL — ABNORMAL HIGH (ref 150–400)
RBC: 4.05 MIL/uL (ref 3.87–5.11)
RBC: 3.91 MIL/uL (ref 3.87–5.11)
RDW: 17.6 % — ABNORMAL HIGH (ref 11.5–15.5)
RDW: 17.2 % — ABNORMAL HIGH (ref 11.5–15.5)
WBC: 11.9 10*3/uL — ABNORMAL HIGH (ref 4.0–10.5)
WBC: 15.2 10*3/uL — ABNORMAL HIGH (ref 4.0–10.5)

## 2010-07-31 LAB — MAGNESIUM: Magnesium: 1.1 mg/dL — ABNORMAL LOW (ref 1.5–2.5)

## 2010-07-31 LAB — PHOSPHORUS: Phosphorus: 2.2 mg/dL — ABNORMAL LOW (ref 2.3–4.6)

## 2010-07-31 LAB — MRSA PCR SCREENING: MRSA by PCR: NEGATIVE

## 2010-08-04 ENCOUNTER — Encounter: Payer: Self-pay | Admitting: Surgery

## 2010-08-05 LAB — CBC
HCT: 24.1 % — ABNORMAL LOW (ref 36.0–46.0)
HCT: 23.5 % — ABNORMAL LOW (ref 36.0–46.0)
Hemoglobin: 8.1 g/dL — ABNORMAL LOW (ref 12.0–15.0)
Hemoglobin: 7.2 g/dL — ABNORMAL LOW (ref 12.0–15.0)
MCH: 22.7 pg — ABNORMAL LOW (ref 26.0–34.0)
MCH: 23.4 pg — ABNORMAL LOW (ref 26.0–34.0)
MCHC: 31.1 g/dL (ref 30.0–36.0)
MCV: 75.4 fL — ABNORMAL LOW (ref 78.0–100.0)
RBC: 3.08 MIL/uL — ABNORMAL LOW (ref 3.87–5.11)
WBC: 13.2 10*3/uL — ABNORMAL HIGH (ref 4.0–10.5)
WBC: 8.2 10*3/uL (ref 4.0–10.5)
WBC: 6.3 10*3/uL (ref 4.0–10.5)

## 2010-08-05 LAB — COMPREHENSIVE METABOLIC PANEL
ALT: 13 U/L (ref 0–35)
ALT: 10 U/L (ref 0–35)
AST: 14 U/L (ref 0–37)
AST: 21 U/L (ref 0–37)
Albumin: 1.9 g/dL — ABNORMAL LOW (ref 3.5–5.2)
Alkaline Phosphatase: 44 U/L (ref 39–117)
BUN: 2 mg/dL — ABNORMAL LOW (ref 6–23)
CO2: 23 mEq/L (ref 19–32)
Calcium: 7.3 mg/dL — ABNORMAL LOW (ref 8.4–10.5)
Chloride: 100 mEq/L (ref 96–112)
Chloride: 105 mEq/L (ref 96–112)
GFR calc Af Amer: >60 mL/min (ref >60)
GFR calc Af Amer: >60 mL/min (ref >60)
GFR calc non Af Amer: >60 mL/min (ref >60)
Glucose, Bld: 114 mg/dL — ABNORMAL HIGH (ref 70–99)
Glucose, Bld: 114 mg/dL — ABNORMAL HIGH (ref 70–99)
Potassium: 3.4 mEq/L — ABNORMAL LOW (ref 3.5–5.1)
Potassium: 3.6 mEq/L (ref 3.5–5.1)
Sodium: 133 mEq/L — ABNORMAL LOW (ref 135–145)
Sodium: 136 mEq/L (ref 135–145)
Sodium: 134 mEq/L — ABNORMAL LOW (ref 135–145)
Total Bilirubin: <0.1 mg/dL — ABNORMAL LOW (ref 0.3–1.2)
Total Bilirubin: 0.3 mg/dL (ref 0.3–1.2)
Total Protein: 4.6 g/dL — ABNORMAL LOW (ref 6.0–8.3)

## 2010-08-05 LAB — GLUCOSE, CAPILLARY
Glucose-Capillary: 131 mg/dL — ABNORMAL HIGH (ref 70–99)
Glucose-Capillary: 89 mg/dL (ref 70–99)
Glucose-Capillary: 105 mg/dL — ABNORMAL HIGH (ref 70–99)

## 2010-08-05 LAB — URINALYSIS, MICROSCOPIC ONLY
Bilirubin Urine: NEGATIVE
Specific Gravity, Urine: 1.013 (ref 1.005–1.030)
Urine Glucose, Fasting: NEGATIVE mg/dL
Urobilinogen, UA: 0.2 mg/dL (ref 0.0–1.0)

## 2010-08-05 LAB — MAGNESIUM: Magnesium: 1.9 mg/dL (ref 1.5–2.5)

## 2010-08-05 LAB — DIFFERENTIAL
Basophils Absolute: 0 10*3/uL (ref 0.0–0.1)
Basophils Relative: 0 % (ref 0–1)
Eosinophils Absolute: 0 10*3/uL (ref 0.0–0.7)
Eosinophils Relative: 0 % (ref 0–5)
Monocytes Absolute: 1 10*3/uL (ref 0.1–1.0)
Monocytes Relative: 8 % (ref 3–12)

## 2010-08-05 LAB — APTT: aPTT: 44 seconds — ABNORMAL HIGH (ref 24–37)

## 2010-08-05 LAB — ABO/RH: ABO/RH(D): AB POS

## 2010-08-05 LAB — PHOSPHORUS: Phosphorus: 2.7 mg/dL (ref 2.3–4.6)

## 2010-08-05 LAB — TRIGLYCERIDES: Triglycerides: 75 mg/dL (ref <150)

## 2010-08-05 LAB — URINE CULTURE
Colony Count: NO GROWTH
Culture: NO GROWTH

## 2010-08-06 LAB — COMPREHENSIVE METABOLIC PANEL
AST: 59 U/L — ABNORMAL HIGH (ref 0–37)
Albumin: 1.9 g/dL — ABNORMAL LOW (ref 3.5–5.2)
BUN: 4 mg/dL — ABNORMAL LOW (ref 6–23)
CO2: 25 mEq/L (ref 19–32)
Calcium: 8.2 mg/dL — ABNORMAL LOW (ref 8.4–10.5)
Chloride: 102 mEq/L (ref 96–112)
Creatinine, Ser: 0.64 mg/dL (ref 0.4–1.2)
Creatinine, Ser: 0.51 mg/dL (ref 0.4–1.2)
GFR calc Af Amer: >60 mL/min (ref >60)
GFR calc non Af Amer: >60 mL/min (ref >60)
GFR calc non Af Amer: >60 mL/min (ref >60)
Total Bilirubin: 0.2 mg/dL — ABNORMAL LOW (ref 0.3–1.2)

## 2010-08-06 LAB — GLUCOSE, CAPILLARY
Glucose-Capillary: 127 mg/dL — ABNORMAL HIGH (ref 70–99)
Glucose-Capillary: 109 mg/dL — ABNORMAL HIGH (ref 70–99)
Glucose-Capillary: 111 mg/dL — ABNORMAL HIGH (ref 70–99)
Glucose-Capillary: 113 mg/dL — ABNORMAL HIGH (ref 70–99)
Glucose-Capillary: 126 mg/dL — ABNORMAL HIGH (ref 70–99)
Glucose-Capillary: 103 mg/dL — ABNORMAL HIGH (ref 70–99)
Glucose-Capillary: 108 mg/dL — ABNORMAL HIGH (ref 70–99)
Glucose-Capillary: 117 mg/dL — ABNORMAL HIGH (ref 70–99)

## 2010-08-06 LAB — CROSSMATCH
ABO/RH(D): AB POS
Unit division: 0

## 2010-08-06 LAB — TRIGLYCERIDES: Triglycerides: 141 mg/dL (ref <150)

## 2010-08-06 LAB — CBC
Hemoglobin: 9.7 g/dL — ABNORMAL LOW (ref 12.0–15.0)
MCH: 24 pg — ABNORMAL LOW (ref 26.0–34.0)
MCHC: 31.4 g/dL (ref 30.0–36.0)
MCV: 78.8 fL (ref 78.0–100.0)
RBC: 4.05 MIL/uL (ref 3.87–5.11)
RDW: 17.4 % — ABNORMAL HIGH (ref 11.5–15.5)

## 2010-08-06 LAB — DIFFERENTIAL
Basophils Absolute: 0.1 10*3/uL (ref 0.0–0.1)
Eosinophils Absolute: 0.3 10*3/uL (ref 0.0–0.7)
Lymphs Abs: 1.7 10*3/uL (ref 0.7–4.0)
Monocytes Absolute: 1 10*3/uL (ref 0.1–1.0)

## 2010-08-06 LAB — CHOLESTEROL, TOTAL: Cholesterol: 75 mg/dL (ref 0–200)

## 2010-08-07 LAB — CBC
Hemoglobin: 9.3 g/dL — ABNORMAL LOW (ref 12.0–15.0)
MCH: 24.3 pg — ABNORMAL LOW (ref 26.0–34.0)
MCHC: 30.9 g/dL (ref 30.0–36.0)
MCV: 78.8 fL (ref 78.0–100.0)

## 2010-08-07 LAB — COMPREHENSIVE METABOLIC PANEL
AST: 37 U/L (ref 0–37)
BUN: 6 mg/dL (ref 6–23)
CO2: 24 mEq/L (ref 19–32)
Calcium: 8.4 mg/dL (ref 8.4–10.5)
Creatinine, Ser: 0.52 mg/dL (ref 0.4–1.2)
GFR calc Af Amer: >60 mL/min (ref >60)
GFR calc non Af Amer: >60 mL/min (ref >60)
Glucose, Bld: 115 mg/dL — ABNORMAL HIGH (ref 70–99)

## 2010-08-07 LAB — GLUCOSE, CAPILLARY
Glucose-Capillary: 109 mg/dL — ABNORMAL HIGH (ref 70–99)
Glucose-Capillary: 126 mg/dL — ABNORMAL HIGH (ref 70–99)
Glucose-Capillary: 109 mg/dL — ABNORMAL HIGH (ref 70–99)
Glucose-Capillary: 122 mg/dL — ABNORMAL HIGH (ref 70–99)
Glucose-Capillary: 105 mg/dL — ABNORMAL HIGH (ref 70–99)
Glucose-Capillary: 105 mg/dL — ABNORMAL HIGH (ref 70–99)
Glucose-Capillary: 115 mg/dL — ABNORMAL HIGH (ref 70–99)

## 2010-08-07 LAB — CULTURE, BLOOD (ROUTINE X 2)
Culture  Setup Time: 201201192133
Culture: NO GROWTH

## 2010-08-07 LAB — CULTURE, ROUTINE-ABSCESS

## 2010-08-08 ENCOUNTER — Encounter (INDEPENDENT_AMBULATORY_CARE_PROVIDER_SITE_OTHER): Payer: Self-pay | Admitting: *Deleted

## 2010-08-08 LAB — COMPREHENSIVE METABOLIC PANEL
Alkaline Phosphatase: 68 U/L (ref 39–117)
BUN: 6 mg/dL (ref 6–23)
CO2: 24 mEq/L (ref 19–32)
Chloride: 102 mEq/L (ref 96–112)
Creatinine, Ser: 0.6 mg/dL (ref 0.4–1.2)
GFR calc non Af Amer: >60 mL/min (ref >60)
Glucose, Bld: 95 mg/dL (ref 70–99)
Total Bilirubin: 0.2 mg/dL — ABNORMAL LOW (ref 0.3–1.2)

## 2010-08-08 LAB — CBC
HCT: 31.8 % — ABNORMAL LOW (ref 36.0–46.0)
Hemoglobin: 9.8 g/dL — ABNORMAL LOW (ref 12.0–15.0)
MCH: 24.3 pg — ABNORMAL LOW (ref 26.0–34.0)
MCV: 78.7 fL (ref 78.0–100.0)
RBC: 4.04 MIL/uL (ref 3.87–5.11)

## 2010-08-08 LAB — GLUCOSE, CAPILLARY
Glucose-Capillary: 112 mg/dL — ABNORMAL HIGH (ref 70–99)
Glucose-Capillary: 124 mg/dL — ABNORMAL HIGH (ref 70–99)
Glucose-Capillary: 121 mg/dL — ABNORMAL HIGH (ref 70–99)

## 2010-08-08 LAB — URINALYSIS, ROUTINE W REFLEX MICROSCOPIC
Bilirubin Urine: NEGATIVE
Hgb urine dipstick: NEGATIVE
Ketones, ur: NEGATIVE mg/dL
Nitrite: NEGATIVE
Urobilinogen, UA: 0.2 mg/dL (ref 0.0–1.0)

## 2010-08-08 LAB — PHOSPHORUS: Phosphorus: 4.8 mg/dL — ABNORMAL HIGH (ref 2.3–4.6)

## 2010-08-09 LAB — CBC
HCT: 31.4 % — ABNORMAL LOW (ref 36.0–46.0)
Hemoglobin: 9.6 g/dL — ABNORMAL LOW (ref 12.0–15.0)
MCH: 24.1 pg — ABNORMAL LOW (ref 26.0–34.0)
MCV: 78.7 fL (ref 78.0–100.0)
RBC: 3.99 MIL/uL (ref 3.87–5.11)
WBC: 8.4 10*3/uL (ref 4.0–10.5)

## 2010-08-09 LAB — COMPREHENSIVE METABOLIC PANEL
ALT: 26 U/L (ref 0–35)
Albumin: 2.1 g/dL — ABNORMAL LOW (ref 3.5–5.2)
Alkaline Phosphatase: 60 U/L (ref 39–117)
BUN: 6 mg/dL (ref 6–23)
Chloride: 106 mEq/L (ref 96–112)
Glucose, Bld: 115 mg/dL — ABNORMAL HIGH (ref 70–99)
Potassium: 4.3 mEq/L (ref 3.5–5.1)
Sodium: 138 mEq/L (ref 135–145)
Total Bilirubin: 0.1 mg/dL — ABNORMAL LOW (ref 0.3–1.2)
Total Protein: 6.2 g/dL (ref 6.0–8.3)

## 2010-08-09 LAB — GLUCOSE, CAPILLARY: Glucose-Capillary: 123 mg/dL — ABNORMAL HIGH (ref 70–99)

## 2010-08-10 LAB — GLUCOSE, CAPILLARY
Glucose-Capillary: 136 mg/dL — ABNORMAL HIGH (ref 70–99)
Glucose-Capillary: 157 mg/dL — ABNORMAL HIGH (ref 70–99)

## 2010-08-11 LAB — CATH TIP CULTURE

## 2010-08-11 LAB — GLUCOSE, CAPILLARY: Glucose-Capillary: 141 mg/dL — ABNORMAL HIGH (ref 70–99)

## 2010-08-12 LAB — GLUCOSE, CAPILLARY: Glucose-Capillary: 147 mg/dL — ABNORMAL HIGH (ref 70–99)

## 2010-08-12 LAB — DIFFERENTIAL
Basophils Absolute: 0 10*3/uL (ref 0.0–0.1)
Basophils Relative: 1 % (ref 0–1)
Eosinophils Absolute: 0.2 10*3/uL (ref 0.0–0.7)
Monocytes Relative: 7 % (ref 3–12)
Neutro Abs: 4.4 10*3/uL (ref 1.7–7.7)
Neutrophils Relative %: 62 % (ref 43–77)

## 2010-08-12 LAB — CHOLESTEROL, TOTAL: Cholesterol: 97 mg/dL (ref 0–200)

## 2010-08-12 LAB — COMPREHENSIVE METABOLIC PANEL
Albumin: 2.3 g/dL — ABNORMAL LOW (ref 3.5–5.2)
BUN: 8 mg/dL (ref 6–23)
CO2: 25 mEq/L (ref 19–32)
Calcium: 8.6 mg/dL (ref 8.4–10.5)
Chloride: 103 mEq/L (ref 96–112)
Creatinine, Ser: 0.49 mg/dL (ref 0.4–1.2)
GFR calc non Af Amer: >60 mL/min (ref >60)
Total Bilirubin: 0.2 mg/dL — ABNORMAL LOW (ref 0.3–1.2)

## 2010-08-12 LAB — CBC
MCH: 24.4 pg — ABNORMAL LOW (ref 26.0–34.0)
Platelets: 562 10*3/uL — ABNORMAL HIGH (ref 150–400)
RBC: 3.9 MIL/uL (ref 3.87–5.11)
WBC: 7.1 10*3/uL (ref 4.0–10.5)

## 2010-08-12 LAB — PHOSPHORUS: Phosphorus: 4.4 mg/dL (ref 2.3–4.6)

## 2010-08-12 LAB — TRIGLYCERIDES: Triglycerides: 129 mg/dL (ref <150)

## 2010-08-13 LAB — GLUCOSE, CAPILLARY: Glucose-Capillary: 117 mg/dL — ABNORMAL HIGH (ref 70–99)

## 2010-08-13 NOTE — Progress Notes (Signed)
Summary: Triage  Phone Note Other Incoming   Caller: Pt Summary of Call: Pt came in today to have her TB skin test read and was complaining of BRB x 2 days.  She also is requesting Tramadol per Dr Ammie Dalton because it should make her less drowsy. Initial call taken by: Christian Mate CMA [name],  February 18, 2010 1:16 PM  Follow-up for Phone Call        Pt saw Dr. Ammie Dalton.  Discussed pain med, Pt needs something that does not make her drowsy.  Dr. Ammie Dalton suggested tramaldol, but thought Dr. [name] should prescribe this if appropriate.  Pt also wanted Dr. [name] to know hat she has noticed a little more blood in stools for the past 2 days.    Follow-up by: Alberteen Spindle RN,  February 18, 2010 3:20 PM  Additional Follow-up for Phone Call Additional follow up Details #1::        OK to use Tramadol 50 mg, # 60, 1 by mouth q6 hrs as needed pain., 2 refills As far as her bleeding is concerned, I would like for her to have a flex sigmoidoscopy ( Fleet's enema prep), last colon 2009. Additional Follow-up by: Lafayette Dragon MD,  February 18, 2010 3:29 PM    Additional Follow-up for Phone Call Additional follow up Details #2::    Lm for pt to call.  Butch Penny Surface RN  February 19, 2010 8:41 AM  LM for pt to call.  Butch Penny Surface RN  February 19, 2010 2:20 PM  Pt notified.  Rx sent .  Previsit and Flex sig sch. Follow-up by: Alberteen Spindle RN,  February 19, 2010 3:38 PM  New/Updated Medications: TRAMADOL HCL 50 MG TABS (TRAMADOL HCL) 1 by mouth q 6 hrs as needed Prescriptions: TRAMADOL HCL 50 MG TABS (TRAMADOL HCL) 1 by mouth q 6 hrs as needed  #60 x 2   Entered by:   Alberteen Spindle RN   Authorized by:   Lafayette Dragon MD   Signed by:   Alberteen Spindle RN on 02/19/2010   Method used:   [facility]        [facility] [address] [phone]* (retail)       [address]       [place], [place]  [address]       Ph: [phone] or [phone]       Fax: [phone]   RxID:    [id number]

## 2010-08-13 NOTE — Progress Notes (Signed)
Summary: Phone Call  Phone Note Call from Patient Call back at Home Phone [phone]   Caller: Patient Call For: Dr. [name] Reason for Call: Talk to Nurse, Referral Summary of Call: Pt. would like you to call. Would not leave any information as to what she needed. Initial call taken by: Darliss Ridgel,  June 21, 2010 8:33 AM  Follow-up for Phone Call        Patient states [facility] wanted her to get a CT abd/pelvis for evaluation of stoma pain. Apparently, they tried to schedule her at [facility] but insurance will not cover it. Her insurance has always covered CT at [facility] but [place] [facility] will only provide CT services to [facility] physicians. Patient wants to know if we can order the CT at [facility]. (I am unsure that this is an insurance coverage problem due to facility location. It may be because Medicaid dropped patient after she turned 19 and she is "Medicaid pending"). Follow-up by: Madlyn Frankel CMA [name],  June 21, 2010 9:01 AM  Additional Follow-up for Phone Call Additional follow up Details #1::        OH, I don/'t know about that. Should we try schedule it at [place] ?And if she is turned down ,we may look into it further. Additional Follow-up by: Lafayette Dragon MD,  June 22, 2010 5:18 PM

## 2010-08-13 NOTE — Assessment & Plan Note (Signed)
Summary: 555.9/all  Nurse Visit   Allergies: No Known Drug Allergies  Immunizations Administered:  PPD Skin Test:    Vaccine Type: PPD    Site: left forearm    Mfr: Sanofi Pasteur    Dose: 0.1 ml    Route: ID    Given by: Madlyn Frankel CMA (AAMA)    Exp. Date: 04/26/2011    Lot #: [id number]  PPD Results    Date of reading: 02/18/2010    Results: < 26m    Interpretation: negative  Orders Added: 1)  TB Skin Test [86580] 2)  Admin 1st Vaccine [[[id number]]PChristian MateCMA (Deborra Medina  February 18, 2010 1:14 PM

## 2010-08-13 NOTE — Procedures (Signed)
Summary: Colonoscopy  Patient: [name] Note: All result statuses are Final unless otherwise noted.  Tests: (1) Colonoscopy (COL)   COL Colonoscopy           DONE (C)     [facility]     [address] [place], [place]  [address]           COLONOSCOPY PROCEDURE REPORT           PATIENT:  [name], [name]  MR#:  [id number]     BIRTHDATE:  February 21, 1991, 19 yrs. old  GENDER:  female     ENDOSCOPIST:  Gatha Mayer, MD, [facility]           PROCEDURE DATE:  05/01/2010     PROCEDURE:  Colonoscopy [phone]     ASA CLASS:  Class II     INDICATIONS:  Crohn's disease planned resection as disease is     refractory to maximal medical therapy     MEDICATIONS:   Fentanyl 75 mcg IV, Versed 7.5 mg           DESCRIPTION OF PROCEDURE:   After the risks benefits and     alternatives of the procedure were thoroughly explained, informed     consent was obtained.  Digital rectal exam was performed and     revealed no abnormalities.   The  endoscope was introduced through     the anus and advanced to the cecum, which was identified by both     the appendix and ileocecal valve, without limitations.  The     quality of the prep was good, using Colyte.  The instrument was     then slowly withdrawn as the colon was fully examined.     <<PROCEDUREIMAGES>>           FINDINGS:  There were inflammatory changes in the ileum consistent     with Crohn's disease, at the ileocecal valve. Stenotic, friable     and ulcerated ileocecal valve, ileal side. Cecum otherwise normal.     This was otherwise a normal examination of the colon.     Retroflexed views in the rectum revealed no abnormalities.    The     scope was then withdrawn from the patient and the procedure     completed.           COMPLICATIONS:  None     ENDOSCOPIC IMPRESSION:     1) Ileitis - Crohn's at the ileocecal valve     2) Otherwise normal examination of colon and rectum, no other     Crohn's seen so ileum only involved by this  and CT     RECOMMENDATIONS:     1) stop Asacol as no colonic disease today or in 2009 and will     need at least an immunomodulator     2) Suspend and likely stop Humira - Dr. [name] and patient to     decide after surgery     3) would continue 6MP and may need higher dose, could have     holiday off this in immediate post-op period if thought necessary           4) Administer influenza vaccine and Pneumovax if not done (due     to immunosuppression)           ADDENDUM: will also taper steroids in post-op setting           REPEAT EXAM:  In for as needed.           Gatha Mayer, MD, [facility] Regal           CC:  [name], MD and Erroll Luna, MD           n.     REVISED:  05/01/2010 11:49 AM     eSIGNED:   Gatha Mayer at 05/01/2010 11:49 AM           Matilde Sprang, [id number]  Note: An exclamation mark (!) indicates a result that was not dispersed into the flowsheet. Document Creation Date: 05/01/2010 11:50 AM _______________________________________________________________________  (1) Order result status: Final Collection or observation date-time: 05/01/2010 11:33 Requested date-time:  Receipt date-time:  Reported date-time:  Referring Physician:   Ordering Physician: Silvano Rusk [phone]) Specimen Source:  Source: Tawanna Cooler Order Number: [id number] Lab site:

## 2010-08-13 NOTE — Miscellaneous (Signed)
Summary: Humira Dosage Change  Per Dr Nichola Sizer verbal orders, patient is to decrease from 80 mg Humira every other week back down to 40 mg every other week due to increased side effects from the medication. She has already advised patient of this and would like a new, updated prescription to be sent. [name] CMA [name]  March 01, 2010 8:13 AM      Prescriptions: HUMIRA PEN 40 MG/0.8ML  KIT (ADALIMUMAB) Inject 1 PEN (28m) [place] every other week.  #2 x 3   Entered by:   DMadlyn FrankelCMA (AAMA)   Authorized by:   DLafayette DragonMD   Signed by:   DMadlyn FrankelCMA ([place] on 03/01/2010   Method used:   [phone]       [address]       [place]  [address]      Ph: [phone] [phone]      Fax: [phone]  RxID:   [id number]

## 2010-08-13 NOTE — Assessment & Plan Note (Signed)
Summary: F/U FROM APPT. WITH PAULA ON 12-20-09.           [place]   History of Present Illness Primary GI MD: [name] MD Primary Provider: Sherrye Payor, MD Requesting Provider: n/a Chief Complaint: Upper right sided abd pain stil going on since last appt with [facility]. Pt states the pain is not a frequent and only lasts for 5 mins at a time. Pt denies N/V. Pt had one episode 2 weeks ago of BRB adter a hard BM. History of Present Illness:   This 20 year old African American female with severe Crohn's disease of the terminal ileum, colon and duodenum. She had a flare up 4 weeks ago and was started on prednisone 30 mg a day for 2 weeks followed by a taper by 5 mg every one week. Prior Prednisone taper was about 2 years ago. She's much better now denying any abdominal pain, fever or  diarrhea. She's currently on prednisone 10 mg daily and is planning to stay on it for one week and then go down to 5 mg for one week and then 5 mg every other day for one week before she stops.   GI Review of Systems    Reports abdominal pain.     Location of  Abdominal pain: upper abdomen.    Denies acid reflux, belching, bloating, chest pain, dysphagia with liquids, dysphagia with solids, heartburn, loss of appetite, nausea, vomiting, vomiting blood, weight loss, and  weight gain.      Reports rectal bleeding.     Denies anal fissure, black tarry stools, change in bowel habit, constipation, diarrhea, diverticulosis, fecal incontinence, heme positive stool, hemorrhoids, irritable bowel syndrome, jaundice, light color stool, liver problems, and  rectal pain.    Current Medications (verified): 1)  Asacol 400 Mg Tbec (Mesalamine) .... Take 3 Tablets Three Times A Day. 2)  Nexium 40 Mg Cpdr (Esomeprazole Magnesium) .... Take 1 Tablet By Mouth Two Times A Day. 3)  Promethazine Hcl 12.5 Mg Tabs (Promethazine Hcl) .... Take 1 Tablet By Mouth At Bedtime As Needed 4)  Humira Pen 40 Mg/0.35m  Kit (Adalimumab) .... Inject 2  Pens (821m [place] Every Other Week. 5)  Gnp Iron 325 (65 Fe) Mg  Tabs (Ferrous Sulfate) .... Please Take Twice Daily. 6)  Mercaptopurine 50 Mg Tabs (Mercaptopurine) .... Take 1 Tablet By Mouth Once Daily. 7)  Entocort Ec 3 Mg Xr24h-Cap (Budesonide) .... Take 3 Capsules By Mouth Once Daily. Must Have Office Visit For Further Refills! 8)  Levbid 0.375 Mg  Tb12 (Hyoscyamine Sulfate) .... Take One By Mouth 2 Times Daily As Needed For Abd. Pain 9)  Zovirax 800 Mg Tabs (Acyclovir) .... Take As Directed 10)  Fluticasone Propionate 50 Mcg/act Susp (Fluticasone Propionate) .... 2 Sprays Each Nostril Once Daily X 10days, Then As Needed For Sinus Symptoms 11)  Zofran 4 Mg Tabs (Ondansetron Hcl) .... Take 1 Tab Every 6 Hours As Needed 12)  Hydrocodone-Acetaminophen 5-325 Mg Tabs (Hydrocodone-Acetaminophen) .... Take 1 Tab Every 6 Hours As Needed For Pain 13)  Prednisone 10 Mg Tabs (Prednisone) .... One Tablet By Mouth Once Daily 14)  Ativan 0.5 Mg Tabs (Lorazepam) .... Take 1 Tab Before Bedtime As Needed  Do Not Drink or Drive With This Medication.  Allergies: No Known Drug Allergies  Past History:  Past Medical History: Reviewed history from 12/07/2009 and no changes required. CROHN'S DISEASE - dx age [age]ynemia, iron defic  MD rooster: GI - [facility] - [facility] - [place] -  [facility] - [facility]  Past Surgical History: Reviewed history from 12/07/2009 and no changes required. Cholecystectomy (2009)  Family History: Reviewed history from 12/07/2009 and no changes required. Family History of Breast Cancer: Maternal Grandmother Family History of Diabetes:  Maternal uncle & cousin No FH of Colon Cancer  Social History: Reviewed history from 12/07/2009 and no changes required. Patient has never smoked.  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  Review of Systems  The patient denies  allergy/sinus, anemia, anxiety-new, arthritis/joint pain, back pain, blood in urine, breast changes/lumps, change in vision, confusion, cough, coughing up blood, depression-new, fainting, fatigue, fever, headaches-new, hearing problems, heart murmur, heart rhythm changes, itching, menstrual pain, muscle pains/cramps, night sweats, nosebleeds, pregnancy symptoms, shortness of breath, skin rash, sleeping problems, sore throat, swelling of feet/legs, swollen lymph glands, thirst - excessive , urination - excessive , urination changes/pain, urine leakage, vision changes, and voice change.         Pertinent positive and negative review of systems were noted in the above HPI. All other ROS was otherwise negative.   Vital Signs:  Patient profile:   20 year old female Height:      64 inches Weight:      239 pounds BMI:     41.17 Pulse rate:   90 / minute Pulse rhythm:   regular BP sitting:   112 / 76  (left arm) Cuff size:   large  Vitals Entered By: Marlon Pel CMA [name] (January 18, 2010 10:53 AM)  Physical Exam  General:  Well developed, well nourished, no acute distress. Eyes:  PERRLA, no icterus. Mouth:  No deformity or lesions, dentition normal. Neck:  Supple; no masses or thyromegaly. Lungs:  Clear throughout to auscultation. Heart:  Regular rate and rhythm; no murmurs, rubs,  or bruits. Abdomen:  obese protuberant abdomen with normoactive bowel sounds and no palpable tenderness Rectal:  note repeated Extremities:  No clubbing, cyanosis, edema or deformities noted. Skin:  Intact without significant lesions or rashes. Psych:  Alert and cooperative. Normal mood and affect.   Impression & Recommendations:  Problem # 1:  CROHN'S DISEASE (ICD-555.9) status post recent flareup responded to prednisone taper. She will continue the prednisone taper. She will continue all other medications including Humira, 6-MP, prednisone and Asacol;  she does not need any refills today. I will see her in  September 2011  Problem # 2:  CHOLECYSTECTOMY, LAPAROSCOPIC, HX OF (ICD-V45.79) Assessment: Comment Only  Problem # 3:  ANEMIA, IRON DEFICIENCY, CHRONIC (ICD-280.9) on iron supplements  Patient Instructions: 1)  she needs a doctor's note to confirm that she has taken  Darvocet and Vicodin for control of pain to explain the positive of drug screen for the above substance. 2)  Continue all medications, 3)  Continue prednisone taper 4)  Office visit 2 months 5)  Copy sent to : DR Asa Lente

## 2010-08-13 NOTE — Miscellaneous (Signed)
Summary: Certification & Plan of Care / [place] of Care / [place] By: Rise Patience 06/14/2010 15:02:45  _____________________________________________________________________  External Attachment:    Type:   Image     Comment:   External Document

## 2010-08-13 NOTE — Letter (Signed)
Summary: [place]   Imported By: Phillis Knack 03/12/2010 08:51:39  _____________________________________________________________________  External Attachment:    Type:   Image     Comment:   External Document

## 2010-08-13 NOTE — Discharge Summary (Signed)
Summary: Cholelithiasis, Crohns  [name], [name]             ACCOUNT NO.:  [id number]      MEDICAL RECORD NO.:  [id number]          PATIENT TYPE:  INP      LOCATION:  5329                         FACILITY[facility]  [facility]      PHYSICIAN:  Lowella Bandy. [name], MD     DATE OF BIRTH:  04/08/1991      DATE OF ADMISSION:  02/09/2008   DATE OF DISCHARGE:  02/11/2008                                  DISCHARGE SUMMARY      DISCHARGE DIAGNOSES:   1. Symptomatic cholelithiasis.   2. History of Crohn's disease.      DISPOSITION:  Home in stable condition.      DISCHARGE MEDICATIONS:  Patient was asked to continue all of her home   medications, including:   1. Asacol 400 mg three tablets p.o. t.i.d.   2. Nexium 40 mg b.i.d.   3. Phenergan p.r.n.   4. Reglan 10 mg h.s.   5. Symax p.r.n.   6. Darvocet N 100 p.r.n.   7. Humira injections.   8. Daily iron supplements.   9. Citrucel.      CONSULTATIONS:  General surgery was consulted for cholecystectomy.      PROCEDURES:  Laparoscopic cholecystectomy with intraoperative   cholangiogram.      HOSPITAL COURSE:  [name] was admitted February 09, 2008, with   epigastric and left upper quadrant abdominal pain, associated with   nausea and vomiting.  An ultrasound on admission showed cholelithiasis,   choledocholithiasis, and extrahepatic biliary ductal dilatation up to 7   mm.  The patient was admitted for IV fluids, IV antibiotics and ERCP   with probable stone extraction.  Surgical consult was placed for   possible cholecystectomy.  During the ERCP, [name] was found to   have a large amount of retained food in her stomach.  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  The   procedure was terminated for possible aspiration of gastric contents.   Chest x-ray followed the ERCP and showed the lungs to be clear, however.   While the patient had mild transaminitis on admission, her AST and ALT   rapidly  normalized.  Surgery saw the patient and decided to proceed with   the laparoscopic cholecystectomy, instead of waiting a re-attempt at   ERCP.  The IOC was negative.  Patient had an   uneventful postoperative course.  She was discharged home on her normal   medications on February 11, 2008.  The patient was asked to follow up with   Dr. [name], her primary gastroenterologist.  On the day of   discharge, the patient's LFTs revealed a total bilirubin of 0.4,   alkaline phosphatase of 48, AST 31, ALT 36.               [name], NP               Lowella Bandy. [name], MD   Electronically Signed         PG/MEDQ  D:  03/22/2008  T:  03/22/2008  Job:  [id number]

## 2010-08-13 NOTE — Progress Notes (Signed)
Summary: Medication  Phone Note Call from Patient Call back at Home Phone [phone]   Caller: Patient Call For: Dr. [name] Reason for Call: Talk to Nurse Summary of Call: Pt. has some questions about her meds Initial call taken by: Webb Laws,  May 28, 2010 12:20 PM  Follow-up for Phone Call        Patient was just discharged from the hospital today and is not clear on what medications she should be taking. She has been under the surgeons care x 3 weeks (per Nicoletta Ba, PA-C) and needs to contact the surgeons office if she has medication questions. She was scheduled for a 2 wk follow up appointment with Dr [name] on 06/13/10 as recommended. Patient verbalizes understanding and states she will call back if she has any problems. Follow-up by: Madlyn Frankel CMA (AAMA),  May 28, 2010 1:12 PM     Appended Document: Medication    Clinical Lists Changes  Medications: Removed medication of HUMIRA PEN 40 MG/0.8ML  KIT (ADALIMUMAB) Inject 1 PEN (71m) [place] every other week.

## 2010-08-13 NOTE — Letter (Signed)
Summary: School excuse  [facility]  [address] [place], [place] [address]   Phone: [phone]  Fax: [phone]    04/19/2010  TO: WHOM IT MAY CONCERN  RE: [name] [address] [id number]       The above named individual is currently under my care needs to be excused from school    FROM: 04/12/10   THROUGH:04/19/10    REASON:illness/hospitalization    MAY RETURN ON: 04/22/10     If you have any further questions or need additional information, please call.     Sincerely,   [name], MD typed by: Barb Merino RN, CGRN

## 2010-08-13 NOTE — Miscellaneous (Signed)
Summary: SN Order / [place] Order / [place]   Imported By: Rise Patience 06/21/2010 15:15:09  _____________________________________________________________________  External Attachment:    Type:   Image     Comment:   External Document

## 2010-08-13 NOTE — Progress Notes (Signed)
Summary: Question for Dottie  Phone Note Call from Patient Call back at Home Phone [phone]   Call For: Dr [name] Reason for Call: Talk to Nurse Summary of Call: Patient called to ask how much prednisone she should be on. She is currently taking 10 mg daily and I have advised her that per Dr Nichola Sizer office note, this is the correct dosage. Patient is scheduled for upcoming office visit on 07/16/2010. She would also like to let Dr [name] know that her stoma is still very painful and the hydrocodone is not helping at all. We did rx lidocaine for her to apply around the stoma but it was too expensive. She is going to call the surgeon's office but wanted to make Dr [name] aware since she told patient to call us about any concerns. Dr [name], anything on our part we need to do???? Initial call taken by: Irwin Brakeman [facility],  June 17, 2010 2:15 PM  Follow-up for Phone Call        There is an Nupricainal oitment OTC, which could be applied to the stoma and is likely much cheaper than the viscous Lidocaine.But she could also ask the surgeon if he knows what to do. Follow-up by: Lafayette Dragon MD,  June 17, 2010 10:27 PM  Additional Follow-up for Phone Call Additional follow up Details #1::        Patient advised. She has an appointment with the surgeon today and will call us back to keep [place] up to date on what she has been told. Additional Follow-up by: Madlyn Frankel CMA (AAMA),  June 18, 2010 9:30 AM

## 2010-08-13 NOTE — Letter (Signed)
Summary: Patient Notice- Colon Biospy Results  [facility]  [address] [place], [place] [address]   Phone: [phone]  Fax: [phone]        March 04, 2010 MRN: [id number]    [name] [place], [place]  [address]    Dear [name],  I am pleased to inform you that the biopsies taken during your recent colonoscopy did not show any evidence of cancer upon pathologic examination.The biopsies from Your colon show normal tissue, no evidence of colitis. Your bleeding was from rectal irritation only  Additional information/recommendations:  __No further action is needed at this time.  Please follow-up with      your primary care physician for your other healthcare needs.  __Please call [phone] to schedule a return visit to review      your condition.  x__Continue with the treatment plan as outlined on the day of your      exam.  __You should have a repeat colonoscopy examination for this problem           in _ years.  Please call us if you are having persistent problems or have questions about your condition that have not been fully answered at this time.  Sincerely,  Lafayette Dragon MD   This letter has been electronically signed by your physician.  Appended Document: Patient Notice- Colon Biospy Results letter mailed

## 2010-08-13 NOTE — Progress Notes (Signed)
Summary: Speak to Dr [name]  Phone Note Call from Patient   Caller: [phone] Coralyn Mark Call For: DR [name] Summary of Call: Wants to know what is going on with her daughter- her abdomen is swollen still after leaving the hospital. Initial call taken by: Irwin Brakeman [facility],  April 22, 2010 8:27 AM  Follow-up for Phone Call        Patient  was discharged from the hospital last week .  Patient 's mother states she is still having severe pain, abdomen is distended, severe nausea, but  no vomiting.  She is having bowel movements but no diarrhea at this time.  Unable to eat and has no appetite.  I have paged Dr [name] to discuss this with her. Follow-up by: Barb Merino RN, [place],  April 22, 2010 8:55 AM  Additional Follow-up for Phone Call Additional follow up Details #1::        Discussed with Dr [name] patient is to be taking 7.5/325 hydrocodone 2 q 4-6 hours, along with phenergan 25 mg .  Patient  is to remain on a liuqid diet for the next 24-48 hours.  I have advised her per Dr [name] she may not return to work or school until Dr [name] advises at her appointment next week 05/01/10 9:00.  I have asked her to call me back if she tries the above and is still having severe pain.  Patient  verblaized understanding. Additional Follow-up by: Barb Merino RN, [place],  April 22, 2010 9:24 AM

## 2010-08-13 NOTE — Progress Notes (Signed)
Summary: Leave of absence note  Phone Note Call from Patient Call back at Home Phone [phone]   Caller: Patient Call For: Dr. 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 Follow-up by: Leone Payor RN,  May 03, 2010 3:19 PM

## 2010-08-13 NOTE — Consult Note (Signed)
Summary: Chest Pain/Tachycardia    NAME:  KAYLAANN, MOUNTZ             ACCOUNT NO.:  [id number]      MEDICAL RECORD NO.:  [id number]          PATIENT TYPE:  INP      LOCATION:  [place]                         FACILITY:  [facility]      PHYSICIAN:  Thayer Headings, M.D. DATE OF BIRTH:  30-May-1991      DATE OF CONSULTATION:  05/06/2010   DATE OF DISCHARGE:                                    CONSULTATION         HISTORY:  Malessa Zartman is a 20 year old female with a history of   Crohn disease.  She has been hospitalized for about a month.  We are   asked to see her today for episodes of chest pain and tachycardia.      Ms. Yono has a long history of Crohn disease that has been resistant   to multiple medications.  Her home medications include Humira,   prednisone, 6-MP, and Asacol.  She has had continued problems and was   admitted to certainly have surgery.  Yesterday, she started having   episodes of tachycardia.  The EKG reveals sinus tachycardia.  Today, she   started having some chest pain.  The chest pain is pleuritic in nature.   It radiates through to her back.  It is on the left side of center.   There is no radiation down her arm.  She has a question of some   shortness of breath.  She has been largely bed-bound, has not had any   syncope or presyncope.  She has had lots of belly pain and so has lots   of trouble getting up around.  She has been given fluid boluses,   although she has not had any p.o. intake because of her surgery.  She   has been getting TPN.  The patient also has complained of some right-   sided weakness.      CURRENT MEDICATIONS:   1. Labetalol as needed.   2. Prednisone.   3. Humira.   4. 6-MP.   5. Asacol.   6. Lovenox 40 mg subcu daily.      ALLERGIES:  No known drug allergies.      PAST MEDICAL HISTORY:  Crohn disease.      SOCIAL HISTORY:  The patient is a nonsmoker and denies any alcohol.      FAMILY HISTORY:  Positive for cardiac disease.       REVIEW OF SYSTEMS:  As per HPI.  All of systems are negative.      PHYSICAL EXAMINATION:  GENERAL:  She is a young female, in no acute   distress.  She has facial changes consistent with a chronic steroid use.   VITAL SIGNS:  Her blood pressure 130/70.  Heart rate was 130.  After 5   mg of IV metoprolol, her heart rate came down to 110.  She is afebrile   with temperature of 98.9.   HEENT:  Carotids 2+.  She has no bruits, no JVD, no thyromegaly.  Her   mucous membranes are  moist.   NECK:  Supple.   LUNGS:  Poor inspiratory effort.  She has relatively clear lung fields.   HEART:  Regular rate, S1 and S2.  She is somewhat tachycardic.  There is   no S3 gallop.   ABDOMEN:  She is tender following surgery.  She has a few bowel sounds.   She has colostomy.   EXTREMITIES:  She has no calf tenderness.  Her pulses are brisk.   NEUROLOGIC:  She is slightly weak on the right side.  Cranial nerves II   through XII are intact.  Her gait was not able to be assessed.      IMAGING:  Her EKG reveals sinus tachycardia.  She has left ventricular   hypertrophy with T-wave inversions.  These T-wave inversions are new   from her admission EKG.      Her CT scan of the head is negative for acute bleed.  Echocardiogram is   being performed and results are pending.  CT angiography of the chest   has been ordered and results are pending.      LABORATORY DATA:  Her white blood cell count is 14.2, hemoglobin is   10.6, hematocrit 32.8.  Her MCV is 77.8.  Sodium is 137, potassium is   4.2, chlorides 102, CO2 is 27, glucose 113, BUN is 13, creatinine is   0.59.  Her alkaline phosphatase is 58, SGOT is 53, her SGPT is 216,   total protein is 6, albumin is 2.4.  Cholesterol is 133.  Her   triglyceride level is 79.      ASSESSMENT AND PLAN:  Mystery presents with episodes of pleuritic chest   pain and some tachycardia.  The tachycardia is a very responsive to IV   metoprolol.  We will continue to treated  with IV metoprolol as needed.   We will get CT angio of the chest to rule out PTE.  She has been on   Lovenox during this whole admission with the exception being right   around surgery.  She also has had PAS hose ordered.  We will get an   echocardiogram to evaluate her left ventricular function.  We will need   to make sure that she is well hydrated.      The gastroenterologist will be seen her for Crohn's and for elevated   liver function test.  Also, the neurologist will be seeing her for this   weakness.  We will check cardiac enzymes, although I do not think that   this chest pain represents coronary artery disease.  All other medical   problems are stable.               Thayer Headings, M.D.               PJN/MEDQ  D:  05/06/2010  T:  05/06/2010  Job:  [id number]      cc:   [facility] Heckler [facility]      [facility]      Darene Lamer. [place]worth, M.D.   [facility]      Electronically Signed by Mertie Moores M.D. on 05/09/2010 04:05:15 PM

## 2010-08-13 NOTE — Miscellaneous (Signed)
  Clinical Lists Changes  Medications: Changed medication from PROMETHAZINE HCL 12.5 MG TABS (PROMETHAZINE HCL) Take 25 mg every 4 hours to PROMETHAZINE HCL 12.5 MG TABS (PROMETHAZINE HCL) Take 25 mg every 4 hours as needed for nausea

## 2010-08-13 NOTE — Progress Notes (Signed)
Summary: Needs note for work & school  Phone Note Call from Patient Call back at [employer] [phone]   Call For: Dr [name] Summary of Call: Needs a note for school and work. Was in the hospital since last friday 04-12-10 until today. Will have someone come & pick up for her.  Initial call taken by: Irwin Brakeman [facility],  April 19, 2010 1:23 PM  Follow-up for Phone Call        Letters at the front desk patient aware Follow-up by: Barb Merino RN, CGRN,  April 19, 2010 1:38 PM

## 2010-08-13 NOTE — Assessment & Plan Note (Signed)
Summary: nausea, hx crohn's scheduled per Dr. Leola Brazil   History of Present Illness Visit Type: Follow-up Visit Primary GI MD: [name] MD Primary Provider: Sherrye Payor, MD Requesting Provider: n/a Chief Complaint: Patient complains of non stop nausea and fatigue. She is very dizzy and light headed and shaking. She states of being extremely hot and at the same time having cold chills. Her ostomy site is red and burning, she states that she had to change her bag 3 times yesterday due to leaking.  History of Present Illness:   This is a 20 year old African American female with Crohn's disease who is status post recent terminal ileum resection, total colectomy and ileostomy for a small bowel obstruction. She was discharged one week ago feeling rather weak and having problems with the ileostomy. There has been some leakage around the stoma which seemed to be retracted. She also has constant nausea. Her Crohn's disease medications were discontinued prior to surgery. Her steroids were stopped. Her Humira was discontinued. She has lost 25 pounds since the admission. She is having severe anxiety and depression. She is not taking any pain medications. She has not been able to stay at home by herself because of anxiety. There has been no fever. She hass had a cardiology evaluation for sinus tachycardia and was put on Metoprolol  25 mg twice a day.   GI Review of Systems      Denies abdominal pain, acid reflux, belching, bloating, chest pain, dysphagia with liquids, dysphagia with solids, heartburn, loss of appetite, nausea, vomiting, vomiting blood, weight loss, and  weight gain.        Denies anal fissure, black tarry stools, change in bowel habit, constipation, diarrhea, diverticulosis, fecal incontinence, heme positive stool, hemorrhoids, irritable bowel syndrome, jaundice, light color stool, liver problems, rectal bleeding, and  rectal pain.    Current Medications (verified): 1)  Nexium  40 Mg Cpdr (Esomeprazole Magnesium) .... Take 1 Tablet By Mouth Once A Day As Needed 2)  Promethazine Hcl 12.5 Mg Tabs (Promethazine Hcl) .... Take 25 Mg Every 4 Hours As Needed For Nausea 3)  Zovirax 800 Mg Tabs (Acyclovir) .... Take As Directed 4)  Hydrocodone-Acetaminophen 5-325 Mg Tabs (Hydrocodone-Acetaminophen) .... Take 1 Tab Every 6 Hours As Needed For Pain 5)  Ativan 0.5 Mg Tabs (Lorazepam) .... Take 1 Tab Before Bedtime As Needed  Do Not Drink or Drive With This Medication. 6)  Wellbutrin 75 Mg Tabs (Bupropion Hcl) .... Take 1 Tablet By Mouth At Bedtime 7)  Levofloxacin 500 Mg Tabs (Levofloxacin) .Marland Kitchen.. 1po Once Daily (Holding) 8)  Prednisone 10 Mg Tabs (Prednisone) .... Take One Tablet Daily 9)  Metoprolol Tartrate 50 Mg Tabs (Metoprolol Tartrate) .... Take .5 Tablet By Mouth Two Times A Day  Allergies (verified): No Known Drug Allergies  Past History:  Past Medical History: Last updated: 12/07/2009 CROHN'S DISEASE - dx age 27y anemia, iron defic  MD rooster: GI - [facility] - [facility] - [place] - [facility]  Past Surgical History: Cholecystectomy (2009) Ostomy   Family History: Reviewed history from 12/07/2009 and no changes required. Family History of Breast Cancer: Maternal Grandmother Family History of Diabetes:  Maternal uncle & cousin No FH of Colon Cancer  Social History: Reviewed history from 12/07/2009 and no changes required. Patient has never smoked.  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  The patient denies anemia, blood in urine, breast changes/lumps,  confusion, cough, coughing up blood, fainting, hearing problems, heart murmur, itching, menstrual pain, muscle pains/cramps, nosebleeds, pregnancy symptoms, sore throat, swelling of feet/legs, swollen lymph glands, thirst - excessive , urination - excessive , urination changes/pain, urine leakage, vision changes, and voice change.         Pertinent positive and negative review of systems were noted in the above HPI. All other ROS was otherwise negative.   Vital Signs:  Patient profile:   20 year old female Height:      64 inches Weight:      217.4 pounds BMI:     37.45 Pulse rate:   112 / minute Pulse rhythm:   regular BP sitting:   136 / 8  (left arm) Cuff size:   large  Vitals Entered By: Bernita Buffy CMA [name] (June 05, 2010 10:46 AM)  Physical Exam  General:  ill-appearing, alert and oriented. Eyes:  nonicteric. Mouth:  normal mucosa. Neck:  Supple; no masses or thyromegaly. Lungs:  Clear throughout to auscultation. Heart:  rapid S1-S2. Abdomen:  soft nontender abdomen with normoactive bowel sounds. Ileostomy in the left middle quadrant. Liquid output does not smell like stool. It is Hemoccult positive. Msk:  Symmetrical with no gross deformities. Normal posture. Psych:  Appears depressed.   Impression & Recommendations:  Problem # 1:  CROHN'S DISEASE (ICD-555.9) Patient is status post subtotal colectomy and ileostomy. She seems to be somewhat dehydrated. I have encouraged her to drink Gatorade and liquids. We will check her chemistries every 2 weeks. She just had a normal set of the electrolytes. She has a home nurse to come twice a week. Also, an ileostomy nurse will be coming this week to help with the appliances. She had Crohn's disease of the duodenum before and we have to watch for that so I would keep her on prednisone 10 mg a day for the moment. r/o SBO as per last CT scan 1 week ago.she may have to be reevaluated for small bowel obstruction  Problem # 2:  UTI'S,  HX OF (ICD-V13.00) on Levaquin 500 mg daily  Problem # 3:  CHOLECYSTECTOMY, LAPAROSCOPIC, HX OF (ICD-V45.79) Assessment: Comment Only  Patient Instructions: 1)  Please pick up your prescriptions at the pharmacy. Electronic prescription(s) has already been sent for Ativan 1 mg three times daily as well as for lidocaine cream which you may apply around the stoma site every 2 hours as needed. 2)  You will need to go to the basement floor of our office around 06/19/10 for labs including a CBC, CMET. 3)  Finish Antibiotics for your UTI. 4)  Please schedule a follow-up appointment in 4 weeks.  5)  Copy sent to : Dr Rowan Blase, Dr Donella Stade 6)  continue prednisone 10 mg daily 7)  Decrease Metoprolol to  25 mg daily 8)  The medication list was reviewed and reconciled.  All changed / newly prescribed medications were explained.  A complete medication list was provided to the patient / caregiver. Prescriptions: PROMETHAZINE HCL 25 MG TABS (PROMETHAZINE HCL) Take 1 tablet by mouth every 4-6 hour s as needed for nausea  #30 x 0   Entered by:   Madlyn Frankel CMA (AAMA)   Authorized by:   Lafayette Dragon  MD   Signed by:   Madlyn Frankel CMA (AAMA) on 06/05/2010   Method used:   Electronically to        [facility] [address] [phone]* (retail)       [address]       [place], [place]  [address]       Ph: [phone] or [phone]       Fax: [phone]   RxID:   [id number] ATIVAN 1 MG TABS (LORAZEPAM) Take 1 tablet by mouth three times a day  #90 x 1   Entered by:   Madlyn Frankel CMA ([place])   Authorized by:   Lafayette Dragon MD   Signed by:   [place] ([place]) on 06/05/2010   Method used:   Printed then faxed to ...       [facility] [address] [phone]* (retail)       [address]       [place], [place]  [address]       Ph: [phone] or [phone]       Fax: [phone]   RxID:   [id number] LIDOCREAM 4 % CREA  (LIDOCAINE) Apply to stoma every 2 hours as needed for burning  #60 grams x 0   Entered by:   Madlyn Frankel CMA ([place])   Authorized by:   Lafayette Dragon MD   Signed by:   Madlyn Frankel CMA ([place]) on 06/05/2010   Method used:   [facility]ically [facility]        [facility] [address] [phone]* (retail)       [address]       [place], [place]  [address]       Ph: [phone] or [phone]       Fax: [phone]   RxID:   [id number]

## 2010-08-13 NOTE — Progress Notes (Signed)
Summary: Hematology Appt. Scheduled  Phone Note Outgoing Call   Call placed by: Vivia Ewing LPN,  September 20, 347 4:11 PM Call placed to: Patient Summary of Call: See MD order on labs dated 09-19-09. Records and appt. request faxed to [facility] at [place] office. Pt. is aware I will call her with appt. info. as soon as I get it. Initial call taken by: Vivia Ewing LPN,  September 19, 1789 4:12 PM  Follow-up for Phone Call        Left a message for Renee at [place] office, to please call me with pt. appt. information. Follow-up by: Vivia Ewing LPN,  September 22, 5054 3:11 PM  Additional Follow-up for Phone Call Additional follow up Details #1::        Pt. is scheduled to see Dr.Sherrill on 10-12-09 at 1:30pm. Appt. info. reviewed with Janett Billow. She will call back as needed. Additional Follow-up by: Vivia Ewing LPN,  September 24, 9792 11:34 AM

## 2010-08-13 NOTE — Progress Notes (Signed)
Summary: resend rx   Phone Note Call from Patient Call back at Home Phone [phone]   Caller: Patient Call For: [name] Reason for Call: Talk to Nurse Summary of Call: Patient states that she is at the pharmacy now and they are telling her that they did not receive any refills for her meds, can you please resend it. Initial call taken by: Ronalee Red,  November 05, 2009 10:52 AM  Follow-up for Phone Call        Pharmacy states that they did not get our electronic script sent this morning. I have given them verbal orders for nexium and acyclovir so patient should now be able to get those prescriptions. Follow-up by: Madlyn Frankel CMA [name],  November 05, 2009 11:32 AM

## 2010-08-13 NOTE — Consult Note (Signed)
Summary: Crohns Disease    NAME:  [name], [name]             ACCOUNT NO.:  [id number]      MEDICAL RECORD NO.:  [id number]          PATIENT TYPE:  INP      LOCATION:  14                         FACILITY:  [facility]      PHYSICIAN:  Joyice Faster. Cornett, M.D.DATE OF BIRTH:  03-15-1991      DATE OF CONSULTATION:  04/30/2010   DATE OF DISCHARGE:                                    CONSULTATION         REFERRING PHYSICIAN:  Gatha Mayer, MD, FACG      REASON FOR CONSULTATION:  Crohn disease, failure of medical management,   chronic pain, nausea, vomiting.      HISTORY OF PRESENT ILLNESS:  The patient is a pleasant 20 year old   female with a long-standing history of Crohn disease.  She was diagnosed   when she was 18 and is followed by Dr. [name].  She was admitted on   April 22, 2010, after an exacerbation of her Crohn's.  She was   discharged on April 18, 2010, for the same problem after being in the   hospital for over a week.  She has been on Solu-Medrol, Humira, and 6-   mercaptopurine at least for the last week and her symptoms are not   improving.  She continues to have right lower quadrant pain.  This is   requiring narcotics for control.  She has nausea and vomiting and poor   appetite.  She has a white count of 17,000, and was on Cipro as well.   Despite maximum medical management, she is not improving at this point.   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  Dr. Carlean Purl consulted me   today to discuss surgical options, as she has basically failed all   medical attempts to control her Crohn's.  She has had no previous   operations.      PAST MEDICAL HISTORY:   1. Crohn disease.   2. Anemia.      PAST SURGICAL HISTORY:  Laparoscopic cholecystectomy.      CURRENT MEDICATIONS:  Solu-Medrol, Asacol, 6-mercaptopurine, Cipro,   Dilaudid, TNA, and the Humira was held today.      ALLERGIES TO  MEDICINES:  None.      FAMILY HISTORY:  Noncontributory.      SOCIAL HISTORY:  Denies tobacco or alcohol abuse.      REVIEW OF SYSTEMS:  As above, otherwise negative x15.      PHYSICAL EXAMINATION:  VITAL SIGNS:  Temperature 97, pulse 110, blood   pressure 145/85.   GENERAL APPEARANCE:  A pleasant female in no apparent stress.   HEENT:  No jaundice.  Oropharynx is moist.   NECK:  Supple, nontender.  Trachea midline.   PULMONARY:  Lung sounds are clear.   CHEST:  Wall motion normal.   CARDIOVASCULAR:  Regular rate and rhythm without rub, murmur, or gallop.   EXTREMITIES:  Warm, well perfused.   ABDOMEN:  Tender right lower quadrant  with fullness and mass noted in   the right lower quadrant.  No hernia.  No diffuse peritonitis.   Tenderness to palpation noted with mild guarding.   EXTREMITIES:  No clubbing, cyanosis, or edema.  PICC line in the right   upper extremity.  No evidence of  swelling. NEURO:  Glasgow coma scale   is 15.  Motor and sensory functions are grossly intact.      DIAGNOSTIC STUDIES:  Her capsule endoscopy was reviewed, which shows   terminal ileitis.  By x-ray, it appears to at least be in the colon, it   looks like to me, if not more distal.  CT scan from April 22, 2010,   was reviewed.  She has focal disease, it appears to be involving the   terminal ileum and cecum.  The remainder of small bowel looks relatively   good as well as her colon.  She has had no recent colonoscopy.  There is   no free fluid.  There are some inflammatory changes adjacent to the   cecum and appendix, external to the bowel itself.      White count is 17,000 with left shift.  Electrolytes are within normal   limits.  She is on TNA.  Prealbumin is 37, which is within normal   limits.      IMPRESSION:   1. Chronic Crohn disease, nonresponsive to medical management.   2. Anemia.      DISCUSSION:  I had a long talk with the patient and her mother today.   She has failed medical  management despite maximum medical therapy and   continues to have pain and problems from her Crohn's, requiring multiple   hospitalizations.  I discussed surgical options, which include resection   of this area with potential for anastomosis versus ileostomy depending   on how much inflammation is present.  She has been on numerous   immunomodulators and immunosuppressants, therefore, she is quite   immunocompromised I think  at this point in time.  I do have   reservations about anastomosis, but the tissue appears healthy and I   think it is a possibility.  I talked to them about an ileostomy, which   will be temporary, to allow her to get off some of these medications,   her body to heal, and then bring her back in about 3-6 months to close   her ileostomy.  Both options were viable, but I think this will be an   intraoperative decision with risks of anastomotic leakage weighed   against having an ileostomy in a second procedure.  She is relatively   healthy and relatively young and a lot of this will depend on how   inflamed and if there is any evidence of microperforation, which could   be a possibility looking at her CT scan by my eyes today.  I discussed   both resectional therapy as the best means since this seems to be a   small limited segment with possibility of an ileostomy versus   anastomosis.  They understand the risk of anastomosis in this setting is   anastomotic leakage, sepsis, potentially death, bleeding, infection,   other possible problems, poor wound healing, and the need for further   surgery.  Both the patient and mother voiced understanding the above   potential risks and understand the need for an ileostomy if this is   indeed necessary and this will be temporary, which I explained them.  They agreed to surgical resection since she is quite miserable at this   point and has failed multiple medical attempts.  We will get a   colonoscopy tomorrow and try Thursday  on surgery for exploratory   laparotomy with ileocecectomy and possible ileostomy.  I will follow   along with you.      Thank you for this consultation.               Thomas A. Cornett, M.D.               TAC/MEDQ  D:  04/30/2010  T:  05/01/2010  Job:  [id number]      cc:   Gatha Mayer, MD,FACG   [facility]   [place], [place] [address]      [address] [name], [place] [address]      Electronically Signed by Erroll Luna M.D. on 05/03/2010 01:21:37 PM

## 2010-08-13 NOTE — Progress Notes (Signed)
Summary: Verbal  Phone Note Other Incoming   CallerJenny Reichmann [facility] [phone] Summary of Call: HHRN called requesting verbal for additional visit to monitor pt's multiple ostomies.  Initial call taken by: Crissie Sickles, CMA,  June 13, 2010 2:08 PM     Appended Document: Verbal yes - verbal ok - thanks [name]  June 14, 2010 1:56 PM   [place] notified.  Crissie Sickles, CMA  June 14, 2010 2:44 PM   Clinical Lists Changes

## 2010-08-13 NOTE — Progress Notes (Signed)
Summary: f/u appt?  Phone Note Call from Patient Call back at Home Phone [phone]   Caller: Patient Call For: Dr. [name] Reason for Call: Talk to Nurse Summary of Call: would like to know if Dr. [name] wants to see her after she finishes her Prednisone Initial call taken by: Lucien Mons,  January 17, 2010 12:31 PM  Follow-up for Phone Call        DR.BRODIE-Pt. seen 12-20-09 by [name] NP. When does she need a f/u with you? Follow-up by: Vivia Ewing LPN,  January 17, 6949 72:25 PM  Additional Follow-up for Phone Call Additional follow up Details #1::        I could see her tomorrow if she can get off work, otherwise before her school starts again in August. Additional Follow-up by: Lafayette Dragon MD,  January 17, 2010 12:39 PM    Additional Follow-up for Phone Call Additional follow up Details #2::    Pt. will see Dr.Brodie on 01-18-10 at 10:30am.  Follow-up by: Vivia Ewing LPN,  January 18, 7504 18:33 PM

## 2010-08-13 NOTE — Progress Notes (Signed)
Summary: med request  Phone Note Call from Patient Call back at [phone]   Caller: mother, Terri Call For: Dr. [name] Reason for Call: Talk to Nurse Summary of Call: would like pt to have a med called in for nausea... [facility] Initial call taken by: Lucien Mons,  June 04, 2010 1:34 PM  Follow-up for Phone Call        Patient reports worse nausea today than usual. States"I can't stand up today because I have so much nausea. "  Denies vomiting or fever. Patient states her bowels are working okay. She has taken Phenergan without relief. She states Zofran has not helped her in the past. Patients is drinking fluids but has not eaten solids today. Her mother states she has a UTI also and her mother went to primary care to p/u an rx for anitibiotic for this.(Levofloxacin 500 mg daily per EMR) Please, advise. Follow-up by: Leone Payor RN,  June 04, 2010 3:40 PM  Additional Follow-up for Phone Call Additional follow up Details #1::        Recommendations per Dr. [name]: Prednisone 10 mg by mouth daily, Phenergan 25 mg by mouth every 4 hours, Ativan  up to 81m sublingual x one today , stay on liquids. Scheduled patient on 06/05/10 at 10:30 AM with Dr. BOlevia Perches Patient aware. Additional Follow-up by: RLeone PayorRN,  June 04, 2010 4:13 PM

## 2010-08-13 NOTE — Assessment & Plan Note (Signed)
Summary: post ER visit/sheri   History of Present Illness Primary GI MD: [name] MD Primary Provider: Sherrye Payor, MD Requesting Provider: n/a Chief Complaint: f/u ER visit for upper and RUQ abd pain with diarrhea alternating constipation with hard stools and BRB. Pt still having a lot of abd pain.  History of Present Illness:   This is a 20 year old African American female with complicated Crohn's disease of the terminal ileum, colon and duodenum. She developed a flareup of 2 weeks ago with increasing abdominal pain following reduction of her Humira from 80 to 40 mg every 2 weeks because of side effects.  She is having diarrhea and constipation. She has had rectal bleeding and had a flexible sigmoidoscopy several weeks ago with findings of nonspecific anal rectal irritation. She was seen in the emergency room last week with abdominal pain. A CT scan of the abdomen showed inflammatory changes of the terminal ileum. She has not been able to eat. We increased her prednisone 40 mg a day one week ago and gave her Cipro 500 mg twice a day in addition to her 6 MP 50 mg a day and Asacol as well as Entocort 9 mg daily. There has been no fever.   GI Review of Systems    Reports abdominal pain, loss of appetite, and  nausea.     Location of  Abdominal pain: upper abdomen and RUQ.    Denies acid reflux, belching, bloating, chest pain, dysphagia with liquids, dysphagia with solids, heartburn, vomiting, vomiting blood, weight loss, and  weight gain.      Reports constipation, diarrhea, and  rectal bleeding.     Denies anal fissure, black tarry stools, change in bowel habit, diverticulosis, fecal incontinence, heme positive stool, hemorrhoids, irritable bowel syndrome, jaundice, light color stool, liver problems, and  rectal pain.    Current Medications (verified): 1)  Asacol 400 Mg Tbec (Mesalamine) .... 6 Tablet By Mouth Once Daily 2)  Nexium 40 Mg Cpdr (Esomeprazole Magnesium) .... Take 1 Tablet  By Mouth Two Times A Day. 3)  Promethazine Hcl 12.5 Mg Tabs (Promethazine Hcl) .... Take 1 Tablet By Mouth At Bedtime As Needed 4)  Humira Pen 40 Mg/0.30m  Kit (Adalimumab) .... Inject 1 Pen (481m [place] Every Other Week. 5)  Gnp Iron 325 (65 Fe) Mg  Tabs (Ferrous Sulfate) .... Please Take Twice Daily. 6)  Mercaptopurine 50 Mg Tabs (Mercaptopurine) .... Take 1 Tablet By Mouth Once Daily. 7)  Entocort Ec 3 Mg Xr24h-Cap (Budesonide) .... Take 3 Capsules By Mouth Once Daily. Must Have Office Visit For Further Refills! 8)  Levbid 0.375 Mg  Tb12 (Hyoscyamine Sulfate) .... Take One By Mouth 2 Times Daily As Needed For Abd. Pain 9)  Zovirax 800 Mg Tabs (Acyclovir) .... Take As Directed 10)  Fluticasone Propionate 50 Mcg/act Susp (Fluticasone Propionate) .... 2 Sprays Each Nostril Once Daily X 10days, Then As Needed For Sinus Symptoms 11)  Zofran 4 Mg Tabs (Ondansetron Hcl) .... Take 1 Tab Every 6 Hours As Needed 12)  Hydrocodone-Acetaminophen 5-325 Mg Tabs (Hydrocodone-Acetaminophen) .... Take 1 Tab Every 6 Hours As Needed For Pain 13)  Prednisone 10 Mg Tabs (Prednisone) .... One Tablet By Mouth Once Daily 14)  Ativan 0.5 Mg Tabs (Lorazepam) .... Take 1 Tab Before Bedtime As Needed  Do Not Drink or Drive With This Medication. 15)  Tramadol Hcl 50 Mg Tabs (Tramadol Hcl) ...Marland Kitchen 1 By Mouth Q 6 Hrs As Needed 16)  Ciloxan 0.3 % Soln (Ciprofloxacin Hcl) 17)  Cipro 500 Mg Tabs (Ciprofloxacin Hcl) .... Take 1 Tablet By Mouth Two Times A Day X 7 Days  Allergies (verified): No Known Drug Allergies  Past History:  Past Medical History: Reviewed history from 12/07/2009 and no changes required. CROHN'S DISEASE - dx age 55y anemia, iron defic  MD rooster: GI - [facility] - [facility] - [facility] - [facility]  Past Surgical History: Reviewed history from 12/07/2009 and no changes required. Cholecystectomy (2009)  Family History: Reviewed history from 12/07/2009 and no changes  required. Family History of Breast Cancer: Maternal Grandmother Family History of Diabetes:  Maternal uncle & cousin No FH of Colon Cancer  Social History: Reviewed history from 12/07/2009 and no changes required. Patient has never smoked.  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  Review of Systems  The patient denies allergy/sinus, anemia, anxiety-new, arthritis/joint pain, back pain, blood in urine, breast changes/lumps, change in vision, confusion, cough, coughing up blood, depression-new, fainting, fatigue, fever, headaches-new, hearing problems, heart murmur, heart rhythm changes, itching, menstrual pain, muscle pains/cramps, night sweats, nosebleeds, pregnancy symptoms, shortness of breath, skin rash, sleeping problems, sore throat, swelling of feet/legs, swollen lymph glands, thirst - excessive , urination - excessive , urination changes/pain, urine leakage, vision changes, and voice change.         Pertinent positive and negative review of systems were noted in the above HPI. All other ROS was otherwise negative.   Vital Signs:  Patient profile:   20 year old female Height:      64 inches Weight:      236 pounds BMI:     40.66 Pulse rate:   92 / minute Pulse rhythm:   regular BP sitting:   128 / 72  (right arm) Cuff size:   large  Vitals Entered By: Marlon Pel CMA [name] (April 12, 2010 9:13 AM)  Physical Exam  General:  overweight, alert and oriented. Eyes:  nonicteric. Mouth:  no aphthous ulcers. Neck:  Supple; no masses or thyromegaly. Lungs:  Clear throughout to auscultation. Heart:  Regular rate and rhythm; no murmurs, rubs,  or bruits. Abdomen:  obese abdomen, tender in right upper quadrant, right lower quadrant and epigastrium. There is no rebound. Bowel sounds are normal active. Postcholecystectomy laparoscopic scars. No CVA tenderness. Rectal:  soft Hemoccult  negative stool. Extremities:  no edema. Skin:  hyperpigmented macular spots throughout the extremities and upper body. Psych:  Alert and cooperative. Normal mood and affect.   Impression & Recommendations:  Problem # 1:  CROHN'S DISEASE (ICD-555.9) Patient has a flare up of Crohn's disease of the terminal ileum  and possibly  active Crohn's disease of the duodenum as well. Based on her CT scan findings and lack of response to outpatient therapy, patient will be admitted for intravenous steroids, bowel rest and further diagnostic evaluation. We have decreased her Humira from 80 to 40 mg every 2 weeks because of side effects. This may have to be increased again if she does not response to IV steroids. We will proceed with an upper endoscopy to assess the activity in the duodenum.  Problem # 2:  CHOLECYSTECTOMY, LAPAROSCOPIC, HX OF (ICD-V45.79) This is not an active problem.  Problem # 3:  PUD, HX OF (ICD-V12.71) Patient has a history of a duodenal ulcer secondary to Crohn's. She is on Protonix 40 mg twice a day.  Problem # 4:  ANEMIA,  IRON DEFICIENCY, CHRONIC (ICD-280.9) Patient has chronic iron deficiency anemia for which she is followed by a hematologist. She is intolerant to intravenous iron dextran and may need hematology consultation while in the hospital.  Patient Instructions: 1)  admits to a regular bed with orders written. Case discussed with the PA. 2)  Copy sent to : DR V.Asa Lente

## 2010-08-13 NOTE — Progress Notes (Signed)
Summary: Questions about meds  Phone Note Call from Patient Call back at Home Phone [phone]   Caller: Patient Call For: Dr. [name] Reason for Call: Talk to Nurse Summary of Call: Has some questions about her meds. Was released from [facility] yesterday and does not know what she is suppose to be taken Initial call taken by: Webb Laws,  May 29, 2010 11:29 AM  Follow-up for Phone Call        Amy Trellis Paganini, PA-C talked to patient regarding her discharge medications. We will have her to continue on metoprolol 25 mg-1/2 tablet two times a day, continue Ativan as needed and Hydrocodone as needed for pain. She may also continue Nexium which at this time she is only taking once daily. She is to DISCONTINUE Asacol, ferrous sulfate, Humira (which she has already been off of) and prednisone. She will discontinue 64m but may eventually have to go back on the medications pending Dr BNichola Sizerrecommendations at patient's office visit on 06/13/10. Medication list has been updated according to ANicoletta Ba PA-C recommendations. Per Amy, she would like patient to have refills on ativan and wellbutrin at this time as well. Follow-up by: DMadlyn FrankelCMA (Deborra Medina,  May 29, 2010 4:07 PM    New/Updated Medications: NEXIUM 40 MG CPDR (ESOMEPRAZOLE MAGNESIUM) Take 1 tablet by mouth once a day. ATIVAN 0.5 MG TABS (LORAZEPAM) Take 1 tab before bedtime as needed  Do not drink or drive with this medication. WELLBUTRIN 75 MG TABS (BUPROPION HCL) Take 1 tablet by mouth at bedtime Prescriptions: ATIVAN 0.5 MG TABS (LORAZEPAM) Take 1 tab before bedtime as needed  Do not drink or drive with this medication.  #30 x 2   Entered by:   DMadlyn FrankelCMA (AI[place]   Authorized by:   DLafayette DragonMD   Signed by:   DLewiston(AHarman on 05/29/2010   Method used:   [facility] ...       [phone]       [address]       [place]  [id number]      Ph: [phone] [phone]      Fax: [phone]  RxID:   [id number] 75 MG TABS (BUPROPION HCL) Take 1 tablet by mouth at bedtime  #30 x 2   Entered by:   DMadlyn FrankelCMA (ADam[place]   Authorized by:   DLafayette DragonMD   Signed by:   DMadlyn FrankelCMA (ATexas on 05/29/2010   Method used:   [phone]       [address]       [place]  [address]      Ph: [phone] [phone]      Fax: [phone]  RxID:   [id number]

## 2010-08-13 NOTE — Miscellaneous (Signed)
  Clinical Lists Changes  Medications: Changed medication from PROMETHAZINE HCL 12.5 MG TABS (PROMETHAZINE HCL) Take 1 tablet by mouth at bedtime as needed to PROMETHAZINE HCL 12.5 MG TABS (PROMETHAZINE HCL) Take 25 mg every 4 hours Added new medication of PREDNISONE 10 MG TABS (PREDNISONE) take one tablet daily

## 2010-08-13 NOTE — Assessment & Plan Note (Signed)
Summary: NEW / MEDICAAID / # / CD   Vital Signs:  Patient profile:   20 year old female Height:      64 inches (162.56 cm) Weight:      235.8 pounds (107.18 kg) O2 Sat:      96 % on Room air Temp:     99.2 degrees F (37.33 degrees C) oral Pulse rate:   102 / minute BP sitting:   112 / 78  (left arm) Cuff size:   large  Vitals Entered By: Tomma Lightning (Dec 07, 2009 9:37 AM)  O2 Flow:  Room air CC: New patient/ Pt ? sinus infection, URI symptoms Is Patient Diabetic? No Pain Assessment Patient in pain? no        Primary Care Provider:  [name]  CC:  New patient/ Pt ? sinus infection and URI symptoms.  History of Present Illness: new pt to our division, here to est care  URI Symptoms      This is a 20 year old woman who presents with URI symptoms.  The symptoms began 3 weeks ago.  The severity is described as moderate.  started 3 weeks ago with chest congestion and cough, progressed to sore throat and headache in frontal region. taking OTC sudafed, tylenol cough and cold without relief.  The patient reports nasal congestion, sore throat, earache, and sick contacts, but denies productive cough.  Associated symptoms include low-grade fever (<100.5 degrees) and wheezing.  The patient denies stiff neck, dyspnea, rash, vomiting, and diarrhea.  The patient also reports itchy throat, headache, and severe fatigue.  The patient denies seasonal symptoms and response to antihistamine.  Risk factors for Strep sinusitis include unilateral facial pain.  The patient denies the following risk factors for Strep sinusitis: tooth pain and Strep exposure.    Preventive Screening-Counseling & Management  Alcohol-Tobacco     Alcohol drinks/day: <1     Alcohol Counseling: not indicated; use of alcohol is not excessive or problematic     Smoking Status: never     Tobacco Counseling: not indicated; no tobacco use  Caffeine-Diet-Exercise     Does Patient Exercise: yes     Times/week: 4    Exercise Counseling: not indicated; exercise is adequate     Depression Counseling: not indicated; screening negative for depression  Safety-Violence-Falls     Seat Belt Counseling: not indicated; patient wears seat belts     Helmet Counseling: not indicated; patient wears helmet when riding bicycle/motocycle     Firearms in the Home: no firearms in the home     Firearm Counseling: not applicable     Smoke Detector Counseling: no     Violence Counseling: not indicated; no violence risk noted     Fall Risk Counseling: not indicated; no significant falls noted  Clinical Review Panels:  Prevention   Last Pap Smear:  Interpretation/Result:Negative for intraepithelial Lesion or Malignancy.    (05/14/2008)   Last Colonoscopy:  Location:  [facility].  Results: Normal. (12/20/2008)  Immunizations   Last PPD Date Read:  02/12/2009 (02/12/2009)   Last PPD Result:  negative (02/12/2009)  CBC   WBC:  7.7 (09/19/2009)   RBC:  4.12 (09/19/2009)   Hgb:  10.9 (09/19/2009)   Hct:  34.2 (09/19/2009)   Platelets:  411.0 (09/19/2009)   MCV  83.0 (09/19/2009)   MCHC  31.9 (09/19/2009)   RDW  15.4 (09/19/2009)   PMN:  77.6 (09/19/2009)   Lymphs:  14.5 (09/19/2009)  Monos:  6.6 (09/19/2009)   Eosinophils:  0.8 (09/19/2009)   Basophil:  0.5 (09/19/2009)  Complete Metabolic Panel   Glucose:  95 (09/19/2009)   Sodium:  141 (09/19/2009)   Potassium:  3.7 (09/19/2009)   Chloride:  109 (09/19/2009)   CO2:  30 (09/19/2009)   BUN:  3 (09/19/2009)   Creatinine:  0.6 (09/19/2009)   Albumin:  3.2 (09/19/2009)   Total Protein:  7.3 (09/19/2009)   Calcium:  9.0 (09/19/2009)   Total Bili:  0.2 (09/19/2009)   Alk Phos:  57 (09/19/2009)   SGPT (ALT):  16 (09/19/2009)   SGOT (AST):  21 (09/19/2009)   Current Medications (verified): 1)  Asacol 400 Mg Tbec (Mesalamine) .... Take 3 Tablets Three Times A Day. 2)  Nexium 40 Mg Cpdr (Esomeprazole Magnesium) .... Take 1 Tablet By Mouth  Two Times A Day. 3)  Promethazine Hcl 12.5 Mg Tabs (Promethazine Hcl) .... Take 1 Tablet By Mouth At Bedtime As Needed 4)  Humira Pen 40 Mg/0.93m  Kit (Adalimumab) .... Inject 2 Pens (848m [place] Every Other Week. 5)  Gnp Iron 325 (65 Fe) Mg  Tabs (Ferrous Sulfate) .... Please Take Twice Daily. 6)  Mercaptopurine 50 Mg Tabs (Mercaptopurine) .... Take 1 Tablet By Mouth Once Daily. 7)  Entocort Ec 3 Mg Xr24h-Cap (Budesonide) .... Take 3  Tablets (74m81mBy Mouth Once Daily. 8)  Levbid 0.375 Mg  Tb12 (Hyoscyamine Sulfate) .... Take One By Mouth 2 Times Daily Prn 9)  Zovirax 800 Mg Tabs (Acyclovir) .... Take As Directed  Allergies (verified): No Known Drug Allergies  Past History:  Past Medical History: CROHN'S DISEASE - dx age [age]yemia, iron defic  MD rooster: GI - [facility] heme - [facility] gyn - [facility] -  [facility] opth[facility] - [facility]  Past Surgical History: Cholecystectomy (2009)  Family History: Family History of Breast Cancer: Maternal Grandmother Family History of Diabetes:  Maternal uncle & cousin No FH of Colon Cancer  Social History: Patient has never smoked.  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 I have reviewed all other systems and they were negative.   Physical Exam  General:  overweight-appearing.  alert, well-developed, well-nourished, and cooperative to examination.    Head:  +frontal>maxillary pressure R>L to palpation Eyes:  vision grossly intact; pupils equal, round and reactive to light.  conjunctiva and lids normal.    Ears:  normal pinnae bilaterally, without erythema, swelling, or tenderness to palpation. TMs clear, without effusion, or cerumen impaction. Hearing grossly normal bilaterally  Mouth:  teeth and gums in good repair; mucous membranes moist, without lesions or ulcers. oropharynx clear  without exudate, moderythema. mildly enlarged tonsils bilaterally with yellow PND Neck:  supple, full ROM, no masses, no thyromegaly; no thyroid nodules or tenderness. no JVD or carotid bruits.   Lungs:  normal respiratory effort, no intercostal retractions or use of accessory muscles; normal breath sounds bilaterally - no crackles and no wheezes.    Heart:  normal rate, regular rhythm, no murmur, and no rub. BLE without edema. Abdomen:  obese, soft, non-tender, normal bowel sounds, no distention; no masses and no appreciable hepatomegaly or splenomegaly.   Msk:  No deformity or scoliosis noted of thoracic or lumbar spine.  no gross deformities Neurologic:  alert & oriented X3 and cranial nerves II-XII symetrically intact.  strength normal in all extremities, sensation intact to light touch, and gait normal. speech fluent without dysarthria or aphasia; follows commands with good comprehension.  Skin:  no rashes, vesicles, ulcers, or erythema. No nodules or irregularity to palpation.  Psych:  Oriented X3, memory intact for recent and remote, normally interactive, good eye contact, not anxious appearing, not depressed appearing, and not agitated.      Impression & Recommendations:  Problem # 1:  SINUSITIS, ACUTE (ICD-461.9)  The following medications were removed from the medication list:    Zithromax Z-pak 250 Mg Tabs (Azithromycin) .Marland Kitchen... Take as directed. Her updated medication list for this problem includes:    Augmentin 875-125 Mg Tabs (Amoxicillin-pot clavulanate) .Marland Kitchen... 1 by mouth two times a day x 7 days    Fluticasone Propionate 50 Mcg/act Susp (Fluticasone propionate) .Marland Kitchen... 2 sprays each nostril once daily x 10days, then as needed for sinus symptoms  Instructed on treatment. Call if symptoms persist or worsen.   Orders: Prescription Created Electronically [phone])  Problem # 2:  ANEMIA, IRON DEFICIENCY, CHRONIC (ICD-280.9)  intol of IV iron- follows with heme (sherril) for same cont  oral supp Her updated medication list for this problem includes:    Gnp Iron 325 (65 Fe) Mg Tabs (Ferrous sulfate) .Marland Kitchen... Please take twice daily.  Hgb: 10.9 (09/19/2009)   Hct: 34.2 (09/19/2009)   Platelets: 411.0 (09/19/2009) RBC: 4.12 (09/19/2009)   RDW: 15.4 (09/19/2009)   WBC: 7.7 (09/19/2009) MCV: 83.0 (09/19/2009)   MCHC: 31.9 (09/19/2009) Iron: 12 (09/19/2009)   % Sat: 2.8 (09/19/2009) B12: 541 (10/18/2007)     Problem # 3:  CROHN'S DISEASE (ICD-555.9) per GI: Crohn's disease of the duodenum and terminal ileum cons the in remission. Continue Humira 80 mg every 2 weeks and all other medications. Decreased Entocort to 6 mg daily for 4 weeks then to 30 mg daily.  cont to follow for specialty care as ongoing  Complete Medication List: 1)  Asacol 400 Mg Tbec (Mesalamine) .... Take 3 tablets three times a day. 2)  Nexium 40 Mg Cpdr (Esomeprazole magnesium) .... Take 1 tablet by mouth two times a day. 3)  Promethazine Hcl 12.5 Mg Tabs (Promethazine hcl) .... Take 1 tablet by mouth at bedtime as needed 4)  Humira Pen 40 Mg/0.44m Kit (Adalimumab) .... Inject 2 pens (832m  every other week. 5)  Gnp Iron 325 (65 Fe) Mg Tabs (Ferrous sulfate) .... Please take twice daily. 6)  Mercaptopurine 50 Mg Tabs (Mercaptopurine) .... Take 1 tablet by mouth once daily. 7)  Entocort Ec 3 Mg Xr24h-cap (Budesonide) .... Take 3  tablets (60m30mby mouth once daily. 8)  Levbid 0.375 Mg Tb12 (Hyoscyamine sulfate) .... Take one by mouth 2 times daily prn 9)  Zovirax 800 Mg Tabs (Acyclovir) .... Take as directed 10)  Augmentin 875-125 Mg Tabs (Amoxicillin-pot clavulanate) ....Marland Kitchen1 by mouth two times a day x 7 days 11)  Fluconazole 150 Mg Tabs (Fluconazole) ....Marland Kitchen1 by mouth once daily as needed for yeast 12)  Fluticasone Propionate 50 Mcg/act Susp (Fluticasone propionate) .... 2 sprays each nostril once daily x 10days, then as needed for sinus symptoms  Patient Instructions: 1)  it was good to see you today.  2)   for your sinus symptoms, take augmentin antibiotics, nose spray as discussed - these + yeast medication prescriptions have been electronically submitted to your pharmacy. Please take as directed. Contact our office if you believe you're having problems with the medication(s).  3)  continue tylenol  cough and cold and sudafed as you are doing - 4)  Please schedule a follow-up appointment in 3 months for review, call sooner if problems.  Prescriptions: FLUTICASONE PROPIONATE 50 MCG/ACT SUSP (FLUTICASONE PROPIONATE) 2 sprays each nostril once daily x 10days, then as needed for sinus symptoms  #1 x 1   Entered and Authorized by:   [name]   Signed by:   [name] on 12/07/2009   Method used:   [facility]        [facility] [address] [phone]* (retail)       [address]       [place], [place]  [address]       Ph: [phone] or [phone]       Fax: [phone]   RxID:   [id number] FLUCONAZOLE 150 MG TABS (FLUCONAZOLE) 1 by mouth once daily as needed for yeast  #3 x 0   Entered and Authorized by:   [name]   Signed by:   [name] on 12/07/2009   Method used:   [facility]        [facility] [address] [phone]* (retail)       [address]       [place], [place]  [address]       Ph: [phone] or [phone]       Fax: [phone]   RxID:   [id number] AUGMENTIN 875-125 MG TABS (AMOXICILLIN-POT CLAVULANATE) 1 by mouth two times a day x 7 days  #14 x 0   Entered and Authorized by:   [name]   Signed by:   [name] on 12/07/2009   Method used:   [facility]        [facility] [address] [phone]* (retail)       [address]       [place], [place]  [address]       Ph: [phone] or [phone]       Fax: [phone]   RxID:   [id number]    Pap Smear  Procedure date:  05/14/2008  Findings:       Interpretation/Result:Negative for intraepithelial Lesion or Malignancy.     Colonoscopy  Procedure date:  12/20/2008  Findings:      Location:  [facility].  Results: Normal.   MISC. Report  Procedure date:  02/09/2008  Findings:      Type of Report: ERCP Impression: 1. attempted ERCP, terminated due to respiratory difficulties 2. Retained gastric contents 3. Mild distal duodenal stricture, most likely Crohn's disease 4. Normal main pancreatic duct

## 2010-08-13 NOTE — Assessment & Plan Note (Signed)
Summary: f/u--ch.   History of Present Illness Visit Type: Follow-up Visit Primary GI MD: [name] MD Primary Provider: n/a Requesting Provider: n/a Chief Complaint: Crohn's f/u. Pt denies any GI complaints  History of Present Illness:   This is a 20 year old African American female with severe Crohn's disease of the duodenum and terminal ileum diagnosed about 8 years ago. Her last appointment with [place] was in September 2010. 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 She has no complaints today other than occasional nausea and a recent staph infection of her skin. She is also having an upper respiratory infection. 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 Her last colonoscopy completed 2 years ago showed a normal colon. She has chronic iron deficiency anemia and is intolerant to iron infusions. Patient's medications include Humira 80 mg every 2 weeks, Entocort 9 mg a day, Asacol 3.6 g a day and 6-MP 50 mg a day. She is also on Nexium 40 mg twice a day.   GI Review of Systems      Denies abdominal pain, acid reflux, belching, bloating, chest pain, dysphagia with liquids, dysphagia with solids, heartburn, loss of appetite, nausea, vomiting, vomiting blood, weight loss, and  weight gain.        Denies anal fissure, black tarry stools, change in bowel habit, constipation, diarrhea, diverticulosis, fecal incontinence, heme positive stool, hemorrhoids, irritable bowel syndrome, jaundice, light color stool, liver problems, rectal bleeding, and  rectal pain.    Current Medications (verified): 1)  Asacol 400 Mg Tbec (Mesalamine) .... Take 3 Tablets Three Times A Day. 2)  Nexium 40 Mg Cpdr (Esomeprazole Magnesium) .... Take 1 Tablet By Mouth Two Times A Day. 3)  Promethazine Hcl 12.5 Mg Tabs (Promethazine Hcl) .... Take 1 Tablet By Mouth At Bedtime As Needed 4)  Humira Pen 40 Mg/0.67m  Kit (Adalimumab) .... Inject 2 Pens (866m [place] Every Other Week. 5)  Gnp Iron 325 (65 Fe) Mg  Tabs (Ferrous  Sulfate) .... Please Take Twice Daily. 6)  Mercaptopurine 50 Mg Tabs (Mercaptopurine) .... Take 1 Tablet By Mouth Once Daily. 7)  Entocort Ec 3 Mg Xr24h-Cap (Budesonide) .... Take 3  Tablets (2m27mBy Mouth Once Daily. 8)  Levbid 0.375 Mg  Tb12 (Hyoscyamine Sulfate) .... Take One By Mouth 2 Times Daily. 9)  Vicodin 5-500 Mg Tabs (Hydrocodone-Acetaminophen) .... 1/2 To 1 By Mouth Every 6 Hours As Needed 10)  Prilosec 20 Mg Cpdr (Omeprazole) ....Marland Kitchen1 By Mouth Once Daily 11)  Zovirax 800 Mg Tabs (Acyclovir) .... Take As Directed  Allergies (verified): No Known Drug Allergies  Past History:  Past Medical History: Reviewed history from 09/17/2007 and no changes required. Current Problems:  MENORRHAGIA (ICD-626.2) ANEMIA, IRON DEFICIENCY, CHRONIC (ICD-280.9) Hx of ACUT DUOD ULCER W/O MENTION HEMORR PERF/OBST (ICD-532.30) CROHN'S DISEASE (ICD-555.9)  Past Surgical History: Reviewed history from 03/22/2009 and no changes required. Cholecystectomy  Family History: Reviewed history from 06/26/2008 and no changes required. Family History of Breast Cancer: Maternal Grandmother Family History of Diabetes:  Maternal uncle & cousin No FH of Colon Cancer:  Social History: Reviewed history from 01/25/2008 and no changes required. Patient has never smoked.  Alcohol Use - no Illicit Drug Use - no Occupation: Student @ [employer]  Review of Systems       The patient complains of allergy/sinus and cough.  The patient denies anemia, anxiety-new, arthritis/joint pain, back pain, blood in urine, breast changes/lumps, change in vision, confusion, coughing up blood, depression-new, fainting, fatigue, fever,  headaches-new, hearing problems, heart murmur, heart rhythm changes, itching, menstrual pain, muscle pains/cramps, night sweats, nosebleeds, pregnancy symptoms, shortness of breath, skin rash, sleeping problems, sore throat, swelling of feet/legs, swollen lymph glands, thirst - excessive , urination -  excessive , urination changes/pain, urine leakage, vision changes, and voice change.         Pertinent positive and negative review of systems were noted in the above HPI. All other ROS was otherwise negative.   Vital Signs:  Patient profile:   20 year old female Height:      64 inches Weight:      233 pounds BMI:     40.14 BSA:     2.09 Pulse rate:   72 / minute Pulse rhythm:   regular BP sitting:   122 / 80  (left arm) Cuff size:   large  Vitals Entered By: Hope Pigeon CMA (September 19, 2009 10:37 AM)  Physical Exam  General:  overweight, alert and oriented, slightly cushingoid. Eyes:  nonicteric. Mouth:  no exudate. Neck:  slight tenderness submandibular nodes Lungs:  Clear throughout to auscultation. Heart:  Regular rate and rhythm; no murmurs, rubs,  or bruits. Abdomen:  obese soft with normal active bowel sounds no tenderness. Skin:  healed skin lesions over the buttocks from prior staph infection. No active lesions Psych:  Alert and cooperative. Normal mood and affect.   Impression & Recommendations:  Problem # 1:  CROHN'S DISEASE (ICD-555.9) Patient has Crohn's disease of the duodenum and terminal ileum which is currently in remission. She is to continue Humira 80 mg every 2 weeks and all other medications. She can decreas her Entocort to 6 mg daily for 4 weeks then to 3 mg daily. We will check a CBC today. Orders: TLB-CBC Platelet - w/Differential (85025-CBCD) TLB-CMP (Comprehensive Metabolic Pnl) (19417-EYCX) TLB-IBC Pnl (Iron/FE;Transferrin) (83550-IBC) TLB-Sedimentation Rate (ESR) (85652-ESR)  Problem # 2:  CHOLECYSTECTOMY, LAPAROSCOPIC, HX OF (ICD-V45.79) Assessment: Comment Only no Sx's  Problem # 3:  ANEMIA, IRON DEFICIENCY, CHRONIC (ICD-280.9) We will check a CBC and have patient to continue iron supplements. Orders: TLB-CBC Platelet - w/Differential (85025-CBCD) TLB-CMP (Comprehensive Metabolic Pnl) (44818-HUDJ) TLB-IBC Pnl (Iron/FE;Transferrin)  (83550-IBC) TLB-Sedimentation Rate (ESR) (85652-ESR)  Patient Instructions: 1)  refill on promethazine. 2)  Decrease Entocort to 6 mg daily for 4 weeks then 3 mg daily. 3)  Continue all other medications. 4)  CBC, sedimentation rate, metabolic panel, iron studies. 5)  Z-Pak for upper respiratory infection. 6)  The medication list was reviewed and reconciled.  All changed / newly prescribed medications were explained.  A complete medication list was provided to the patient / caregiver. 7)  I have dicteted a letter to Dr Julieanne Manson  with request for consultation. Prescriptions: ZITHROMAX Z-PAK 250 MG TABS (AZITHROMYCIN) Take as directed.  #1 pak x 0   Entered by:   Awilda Bill CMA (AAMA)   Authorized by:   Lafayette Dragon MD   Signed by:   Awilda Bill CMA ([place]) on 09/19/2009   Method used:   [facility]        [facility] [address] [phone]* (retail)       [address]       [place], [place]  [address]       Ph: [phone] or [phone]       Fax: [phone]   RxID:   [id number] PROMETHAZINE HCL 12.5 MG TABS (PROMETHAZINE HCL) Take 1 tablet by  mouth at bedtime as needed  #30 x 1   Entered by:   Awilda Bill CMA ([place])   Authorized by:   Lafayette Dragon MD   Signed by:   Awilda Bill CMA ([place]) on 09/19/2009   Method used:   Electronically to        [facility] [address] [phone]* (retail)       [address]       [place], [place]  [address]       Ph: [phone] or [phone]       Fax: [phone]   RxID:   [id number]

## 2010-08-13 NOTE — Progress Notes (Signed)
Summary: Triage  Phone Note Call from Patient Call back at [phone]   Caller: Patient Call For: Dr. [name] Reason for Call: Talk to Nurse Summary of Call: pt is broken out in hives...wants to know if she can take Zyrtec Initial call taken by: Webb Laws,  October 26, 2009 3:14 PM  Follow-up for Phone Call        Pt. c/o 2 days of raised,red itchy areas, began  on her arms and has now moved to her legs. Can she take Zyrtec?  Per Nicoletta Ba PAC:  1) Zyrtec or Benadryl O.K. to take 2) if rash gets worse see PCP ASAP 3) Pt. instructed to call back as needed.  Message left for pt. with above PA instructions.  Follow-up by: Vivia Ewing LPN,  October 27, 5187 3:21 PM

## 2010-08-13 NOTE — Procedures (Signed)
Summary: Flexible Sigmoidoscopy  Patient: [name] Note: All result statuses are Final unless otherwise noted.  Tests: (1) Flexible Sigmoidoscopy (FLX)  FLX Flexible Sigmoidoscopy                             DONE     [facility]     [place], [place]  [address]           FLEXIBLE SIGMOIDOSCOPY PROCEDURE REPORT           PATIENT:  [name], [name]  MR#:  [id number]     BIRTHDATE:  1990/09/04, 19 yrs. old  GENDER:  female           ENDOSCOPIST:  Lowella Bandy. [name], MD     Referred by:           PROCEDURE DATE:  02/28/2010     PROCEDURE:  Flexible Sigmoidoscopy with biopsy     ASA CLASS:  Class II     INDICATIONS:  hematochezia complicated Crohn's disease, on maximum     medical therapy, recent hematochezia while on Prenisone taper           MEDICATIONS:   Versed 9 mg, Fentanyl 75 mcg           DESCRIPTION OF PROCEDURE:   After the risks benefits and     alternatives of the procedure were thoroughly explained, informed     consent was obtained.  Digital rectal exam was performed and     revealed no rectal masses.   The [id number] endoscope     was introduced through the anus and advanced to the descending     colon, without limitations.  The quality of the prep was good.     The instrument was then slowly withdrawn as the mucosa was fully     examined.     <<PROCEDUREIMAGES>>           normal sigmoid. bleeding from anal canal, nonspecific erosions,     fissures, no fistula With standard forceps, biopsy was obtained     and sent to pathology (see image1, image2, image3, image4, image5,     and image6).   Retroflexed views in the rectum revealed no     abnormalities.    The scope was then withdrawn from the patient     and the procedure terminated.           COMPLICATIONS:  None           ENDOSCOPIC IMPRESSION:     1) Normal sigmoid     anorectal source of bleeding, no evidence of acute colitis     RECOMMENDATIONS:     1) await  biopsy results     Canasa supp 1040m 1 hs, samples x 15 days           REPEAT EXAM:  In 0 year(s) for.           ______________________________     DLowella Bandy BOlevia Perches MD           CC:           n.     eSIGNED:   DLowella Bandy Brodie at 02/28/2010 03:59 PM           JMatilde Sprang [id number] Note: An exclamation mark (!) indicates a result that was not dispersed into the flowsheet. Document Creation Date: 02/28/2010 4:01 PM _______________________________________________________________________  (Marland Kitchen  1) Order result status: Final Collection or observation date-time: 02/28/2010 15:44 Requested date-time:  Receipt date-time:  Reported date-time:  Referring Physician:   Ordering Physician: [name] [phone]) Specimen Source:  Source: Tawanna Cooler Order Number: [id number] Lab site:

## 2010-08-13 NOTE — Letter (Signed)
Summary: Pain Control  [facility]  [place], [place] [address]   Phone: [phone]  Fax: [phone]             January 18, 2010 MRN: [id number]    RE: LUKISHA PROCIDA     [address]     [place], [place]  [address]    To Whom It May Concern:  Ms Gennie Eisinger, DOB [birth year] is a patient who has been under my care for several years due to a complicated, rather severe case of Crohn's disease which she was diagnosed with in March 2005. At times, Ms. Loiselle experiences severe abdominal pain associated with this disease which requires her to take medications for pain control. We have given her multiple analgesics and pain medications including Darvocet and hydrocodone in the past and have encouraged her to take those as needed for severe episodes of pain. Unfortunately, Ms.Delamar will probably need to continue taking pain medications as her disease is not likely to improve over time. Although Saidy does periodically take pain medication, I do not forsee this causing her any difficulty in performing required job duties.   I appreciate your prompt attention and consideration to this situation. Should you have any questions or concerns, please feel free to contact me at my [phone].  Sincerely,     Lowella Bandy. [name], MD    Appended Document: Pain Control Letter faxed to Dr Heber Keizer @ fax # [phone] as per patient request. I have also mailed a copy of the coversheet and letter sent to Dr Heber Marcus Hook to the patient in addition to the confirmation sheet stating that the fax has gone through.

## 2010-08-13 NOTE — Letter (Signed)
Summary: [facility]  [place], [place] [address]   Phone: [phone]  Fax: [phone]       [name]    09-14-1990    MRN: [id number]        Procedure Day [name]:  [name]  02/28/10     Arrival Time: 2:00pm     Procedure Time:  3:00pm     Location of Procedure:                    _ X_  [facility] (4th Floor)   [place]  Prior to the day before your procedure, purchase one 8 oz. bottle of Magnesium Citrate and one Fleet Enema from the laxative section of your drugstore.  _________________________________________________________________________________________________  THE DAY BEFORE YOUR PROCEDURE             DATE: 08/17      DAY: WEDNESDAY  1.   Have a clear liquid dinner the night before your procedure.  2.   Do not drink anything colored red or purple.  Avoid juices with pulp.  No orange juice.              CLEAR LIQUIDS INCLUDE: Water Jello Ice Popsicles Tea (sugar ok, no milk/cream) Powdered fruit flavored drinks Coffee (sugar ok, no milk/cream) Gatorade Juice: apple, white grape, white cranberry  Lemonade Clear bullion, consomm, broth Carbonated beverages (any kind) Strained chicken noodle soup Hard Candy   3.   At 7:00 pm the night before your procedure, drink one bottle of Magnesium Citrate over ice.  4.   Drink at least 3 more glasses of clear liquids before bedtime (preferably juices).  5.   Results are expected usually within 1 to 6 hours after taking the Magnesium Citrate.  ___________________________________________________________________________________________________  THE DAY OF YOUR PROCEDURE            DATE: 08/18     DAY: THURSDAY  1.   Use Fleet Enema one hour prior to coming for procedure.  2.   You may drink clear liquids until  1:00pm  (2 hours before exam)       MEDICATION INSTRUCTIONS  Unless  otherwise instructed, you should take regular prescription medications with a small sip of water as early as possible the morning of your procedure.    Additional medication instructions:  Stop taking Iron 5 days before procedure.         OTHER INSTRUCTIONS  You will need a responsible adult at least 22 years of age to accompany you and drive you home.   This person must remain in the waiting room during your procedure.  Wear loose fitting clothing that is easily removed.  Leave jewelry and other valuables at home.  However, you may wish to bring a book to read or an iPod/MP3 player to listen to music as you wait for your procedure to start.  Remove all body piercing jewelry and leave at home.  Total time from sign-in until discharge is approximately 2-3 hours.  You should go home directly after your procedure and rest.  You can resume normal activities the day after your procedure.  The day of your procedure you should not:   Drive   Make legal decisions   Operate machinery   Drink alcohol   Return to work  You will receive specific instructions about eating, activities and medications before you leave.   The above  instructions have been reviewed and explained to me by   Ulice Dash RN  February 20, 2010 5:03 PM    I fully understand and can verbalize these instructions _____________________________ Date _________

## 2010-08-13 NOTE — Progress Notes (Signed)
Summary: triage  Phone Note Call from Patient Call back at Home Phone [phone] Call back at Work Phone [phone]   Caller: Patient Call For: Dr. [name] Reason for Call: Talk to Nurse Summary of Call: recent ER visit for abd pain... was advised to f/u with Dr. [name]... doesnt want to wait until next available Initial call taken by: Lucien Mons,  April 05, 2010 11:46 AM  Follow-up for Phone Call        Patient  was seen in the ER yesterday for a cron's flare.  They did a CT scan.  I have pasted in the ER report and CT scan that was done yesterday.  She was started on prednisone 50 mg once daily for 7 days.  She is schedueld for a follow up with you next Friday.  That will be her last day of prednisone.  I have asked her to call me back if she has any worsening symptoms prior to her appointment next week. Dr [name] please advise if needs additional orders. Follow-up by: Barb Merino RN, CGRN,  April 05, 2010 11:59 AM  Additional Follow-up for Phone Call Additional follow up Details #1::        agree with the above. I have spoken to the pt. In addition to the above, please: send Cipro 551m by mouth two times a day, #14 write excuse for school ([facility] [employer] [facility] for Mon 9/19, for th 9/22, Fri 9/23 and Mon 9/26.due to flare up of Crohn's disease and medical test and treatments including ER visit on 04/04/2010. Somebody will pick it up or she will call [place] a fax number.  Additional Follow-up by: DLafayette DragonMD,  April 05, 2010 1:33 PM    Additional Follow-up for Phone Call Additional follow up Details #2::    Prescription for cipro sent and school excuse put at front desk for patient pick up. Follow-up by: DMadlyn FrankelCMA (Deborra Medina,  April 05, 2010 4:16 PM  New/Updated Medications: CILOXAN 0.3 % SOLN (CIPROFLOXACIN HCL)  CIPRO 500 MG TABS (CIPROFLOXACIN HCL) Take 1 tablet by mouth two times a day x 7 days Prescriptions: CIPRO 500 MG TABS (CIPROFLOXACIN HCL)  Take 1 tablet by mouth two times a day x 7 days  #14 x 0   Entered by:   DMadlyn FrankelCMA (AAMA)   Authorized by:   DLafayette DragonMD   Signed by:   DMadlyn FrankelCMA (AWaldron on 04/05/2010   Method used:   [phone]       [address]       [place]  [address]      Ph: [phone]      Fax: [phone]  RxID:   [id number]

## 2010-08-13 NOTE — Letter (Signed)
Summary: [place]   Imported By: Phillis Knack 05/16/2010 07:58:23  _____________________________________________________________________  External Attachment:    Type:   Image     Comment:   External Document

## 2010-08-13 NOTE — Progress Notes (Signed)
Summary: Abd Pain & nausea  Phone Note Call from Patient Call back at Home Phone [phone]   Caller: Patient Call For: Dr [name] Summary of Call: In alot of abd pain and nausea thinks her ulcer is acting up. Initial call taken by: Irwin Brakeman [facility],  December 19, 2009 2:49 PM  Follow-up for Phone Call        C/O mid. abd/epigastric pain for 5 days, pain is getting worse. Severe nausea and dizziness, also. Diarrhea since taking antibiotocs for a sinus infection, stools are loose to watery.  Having cold chills. Denies blood in stools. Using Phenergan for nausea. Pt. is at work.   1) See [name] NP on 12-20-09 at 10am 2) Today-go home-rest and get plenty of fluids 3) Continue Phenergan as needed 3) Use your levbid  4) If symptoms become worse call back immediately or go to ER.  Follow-up by: Vivia Ewing LPN,  December 20, 6267 4:85 PM  Additional Follow-up for Phone Call Additional follow up Details #1::        OK I was going to put her on  Flagyl till she sees me but it's better if Nevin Bloodgood sees her. Additional Follow-up by: Lafayette Dragon MD,  December 19, 2009 5:43 PM    New/Updated Medications: LEVBID 0.375 MG  TB12 (HYOSCYAMINE SULFATE) Take one by mouth 2 times daily as needed for abd. pain Prescriptions: LEVBID 0.375 MG  TB12 (HYOSCYAMINE SULFATE) Take one by mouth 2 times daily as needed for abd. pain  #30 x 1   Entered by:   Vivia Ewing LPN   Authorized by:   Lafayette Dragon MD   Signed by:   Vivia Ewing LPN on 46/27/0350   Method used:   Electronically to        [facility] [address] [phone]* (retail)       [address]       [place], [place]  [address]       Ph: [phone] or [phone]       Fax: [phone]   RxID:   [id number]

## 2010-08-13 NOTE — Assessment & Plan Note (Signed)
Summary: 5 DAYS OF ABD.PAIN, NAUSEA AND DIARRHEA   (DR.BRODIE PT.)   D...   History of Present Illness Visit Type: Follow-up Visit Primary GI MD: [name] MD Primary Provider: Sherrye Payor, MD Requesting Provider: n/a Chief Complaint: Patient c/o 4 days nausea and periumbilcal stabbing abdominal pain which apparently becomes so bad patient feels faint. Patient also complains of 4-5 diarrheal episodes daily although she does not see any blood. Patient notes a low grade fever as well. She was recently on amoxicillin x 7 days for sinus infection. History of Present Illness:   [name] is a 20 year old female followed by Dr. [name] for severe Crohn's disease of the duodenum and terminal ileum diagnosed in 2005.  Last seen in March 2011, Crohns' felt to be in remission. Four days ago developed mid upper abdominal pain, nausea, diarrhea, low grade fevers.  Pain almost constant, worse if she tries to eat. Took amoxicillin for sinus infection recently and developed severe diarrhea. Since completing the antibiotics her BMs have improved,  now down to 5-6 loose stools a day. Still not back to normal bowel habits of formed BM about twice daily. She is having low grade fevers. Patient feels she is having Crohn's flare which she hasn't had in years.  Her next Humira injection is tomorrow.     GI Review of Systems    Reports abdominal pain, bloating, loss of appetite, and  nausea.     Location of  Abdominal pain: righ mid.    Denies acid reflux, belching, chest pain, dysphagia with liquids, dysphagia with solids, heartburn, vomiting, vomiting blood, weight loss, and  weight gain.      Reports diarrhea and  rectal pain.     Denies anal fissure, black tarry stools, change in bowel habit, constipation, diverticulosis, fecal incontinence, heme positive stool, hemorrhoids, irritable bowel syndrome, jaundice, light color stool, liver problems, and  rectal bleeding.    Current Medications (verified): 1)   Asacol 400 Mg Tbec (Mesalamine) .... Take 3 Tablets Three Times A Day. 2)  Nexium 40 Mg Cpdr (Esomeprazole Magnesium) .... Take 1 Tablet By Mouth Two Times A Day. 3)  Promethazine Hcl 12.5 Mg Tabs (Promethazine Hcl) .... Take 1 Tablet By Mouth At Bedtime As Needed 4)  Humira Pen 40 Mg/0.51m  Kit (Adalimumab) .... Inject 2 Pens (812m [place] Every Other Week. 5)  Gnp Iron 325 (65 Fe) Mg  Tabs (Ferrous Sulfate) .... Please Take Twice Daily. 6)  Mercaptopurine 50 Mg Tabs (Mercaptopurine) .... Take 1 Tablet By Mouth Once Daily. 7)  Entocort Ec 3 Mg Xr24h-Cap (Budesonide) .... One Capsule By Mouth Once Daily 8)  Levbid 0.375 Mg  Tb12 (Hyoscyamine Sulfate) .... Take One By Mouth 2 Times Daily As Needed For Abd. Pain 9)  Zovirax 800 Mg Tabs (Acyclovir) .... Take As Directed 10)  Fluticasone Propionate 50 Mcg/act Susp (Fluticasone Propionate) .... 2 Sprays Each Nostril Once Daily X 10days, Then As Needed For Sinus Symptoms  Allergies (verified): No Known Drug Allergies  Past History:  Past Medical History: Reviewed history from 12/07/2009 and no changes required. CROHN'S DISEASE - dx age [age]ynemia, iron defic  MD rooster: GI - [facility] - [facility] - [facility] - [facility] - [facility]  Past Surgical History: Reviewed history from 12/07/2009 and no changes required. Cholecystectomy (2009)  Family History: Reviewed history from 12/07/2009 and no changes required. Family History of Breast Cancer: Maternal Grandmother Family History of Diabetes:  Maternal uncle & cousin No FH  of Colon Cancer  Social History: Reviewed history from 12/07/2009 and no changes required. Patient has never smoked.  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  The patient denies anxiety-new, back pain, blood in urine, breast changes/lumps, confusion, coughing up blood, depression-new, fainting, fever, hearing problems, heart murmur, heart rhythm changes, night sweats, nosebleeds, pregnancy symptoms, shortness of breath, sleeping problems, swollen lymph glands, urine leakage, vision changes, and voice change.    Vital Signs:  Patient profile:   20 year old female Height:      64 inches Weight:      235.25 pounds BMI:     40.53 BSA:     2.10 Pulse rate:   88 / minute Pulse rhythm:   regular BP sitting:   126 / 80  (left arm)  Vitals Entered By: [name] CMA [name] (December 20, 2009 10:17 AM)  Physical Exam  General:  Well developed, well nourished, no acute distress. Head:  Normocephalic and atraumatic. Eyes:  Conjunctiva pink, no icterus.  Lungs:  Clear throughout to auscultation. Heart:  Regular rate and rhythm; no murmurs, rubs,  or bruits. Abdomen:  Soft, obese, moderate tenderness to distal aspect of RUQ. She cried when area palpated. No peritoneal signs.  Psych:  Alert and cooperative. Normal mood and affect.   Impression & Recommendations:  Problem # 1:  CROHN'S DISEASE (ICD-555.9) Severe Crohn's disease of the duodenum and terminal ileum diagnosed in 2005. On multiple Crohn's meds, she has been in remission for the last few years. 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 Her last colonoscopy completed 2 years ago showed a normal colon. Now with four day history of nausea, vomiting, diarrhea, low grade temps and right mid to upper abdominal pain. Patient feels she is in a flare. Diarrhea much worse while on Amoxicillin several days ago, now improved but not to baseline. I don't suspect infectious etiology but rather Crohn's flare. Will increase her  Entocort back to 12m daily and start Prednisone taper.  Will give her something for pain and nausea. She will return in 2-3 weeks to see me or Dr. BOlevia Perches    Addendum: Dr. BOlevia Percheswas in clinic today and I spoke with her about JWillenesince she knows patient well. Dr. BOlevia Perchessaw, examined and provided input into plan of care.  Patient Instructions: 1)  We sent perscription for Zofran to your pharmacy. 2)  We faxed the Vicodin prescription to the pharmacy. 3)  Directions for Prednisone 10 MG tablets:  4)  Take 3 tab x 14 days. 5)  Take 2 and 1/2 tab x 7 days. 6)  Take 2 tab x 7 days. 7)  Take 1 tab x 7 days. 8)  Take 1/2 tab x 7 days. 9)  Take 1/2 tab every other day for 3 doses. 10)  Copy sent to : VSherrye Payor MD 11)  The medication list was reviewed and reconciled.  All changed / newly prescribed medications were explained.  A complete medication list was provided to the patient / caregiver. Prescriptions: PREDNISONE 10 MG TABS (PREDNISONE) Take as directed, Directions given to pt.  #100 x  0   Entered by:   Marisue Humble NCMA   Authorized by:   [name] NP   Signed by:   Marisue Humble NCMA on 12/20/2009   Method used:   Electronically to        [facility] [address] [phone]* (retail)       [address]       [place], [place]  [address]       Ph: [phone] or [phone]       Fax: [phone]   RxID:   [id number] HYDROCODONE-ACETAMINOPHEN 5-325 MG TABS (HYDROCODONE-ACETAMINOPHEN) Take 1 tab every 6 hours as needed for pain  #30 x 0   Entered by:   Marisue Humble NCMA   Authorized by:   [name] NP   Signed by:   Marisue Humble NCMA on 12/20/2009   Method used:   Printed then faxed to ...       [facility]  [facility] [address] [phone]* (retail)       [address]       [place], [place]  [address]       Ph: [phone] or [phone]       Fax: [phone]   RxID:   [id number] ZOFRAN 4 MG TABS (ONDANSETRON HCL) Take 1  tab every 6 hours as needed  #40 x 0   Entered by:   Marisue Humble NCMA   Authorized by:   [name] NP   Signed by:   Marisue Humble NCMA on 12/20/2009   Method used:   Electronically to        [facility]  [facility] [address] [phone]* (retail)       [address]       [place], [place]  [address]       Ph: [phone] or [phone]       Fax: [phone]   RxID:   [id number]

## 2010-08-13 NOTE — Progress Notes (Signed)
Summary: Unable to sleep   Phone Note Call from Patient Call back at Home Phone [phone]   Call For: DR Jewelle Whitner Reason for Call: Talk to Nurse Summary of Call: Has not been able to sleep x3 days wonders if it could be the Prednizone. Initial call taken by: Irwin Brakeman [facility],  January 04, 2010 11:32 AM  Follow-up for Phone Call        Lm for pt to call.  Butch Penny Surface RN  January 04, 2010 11:43 AM  See OV note of 12/20/09.  Pt will be on 25 mg of prednisone starting today.  Has not slept the past 3 nights.  Asking for advise. Follow-up by: Alberteen Spindle RN,  January 04, 2010 12:19 PM  Additional Follow-up for Phone Call Additional follow up Details #1::        Per Nevin Bloodgood, we are prescribing ativan lowest dose # 20. Take 1 tab at bedtime.  Do not drink or drive with this medicaiton.  It is definitely the Prednisone keeping her awake. We faxed the signed prescription to your pharmacy. Additional Follow-up by: Sharol Roussel,  January 04, 2010 3:51 PM    Additional Follow-up for Phone Call Additional follow up Details #2::    Pt notified.  Follow-up by: Alberteen Spindle RN,  January 04, 2010 4:08 PM  New/Updated Medications: ATIVAN 0.5 MG TABS (LORAZEPAM) Take 1 tab before bedtime Do not drink or drive with this medication. Prescriptions: ATIVAN 0.5 MG TABS (LORAZEPAM) Take 1 tab before bedtime Do not drink or drive with this medication.  #20 x 0   Entered by:   Marisue Humble NCMA   Authorized by:   [name] NP   Signed by:   Marisue Humble NCMA on 01/04/2010   Method used:   Printed then faxed to ...       [facility]  [facility] [address] [phone]* (retail)       [address]       [place], [place]  [address]       Ph: [phone] or [phone]       Fax: [phone]   RxID:   [id number]

## 2010-08-13 NOTE — Letter (Signed)
Summary: Out of [facility]  [place], [place] [address]   Phone: [phone]  Fax: [phone]                 April 05, 2010   Student:  Joice Lofts    To Whom It May Concern:   For Medical reasons, please excuse the above named student from school for the following dates:  Monday 04/01/10, Thursday 04/04/10, Friday 04/05/10, Monday 04/08/10  If you need additional information, please feel free to contact our office.   Sincerely,    Lowella Bandy. [name], MD

## 2010-08-13 NOTE — Miscellaneous (Signed)
Summary: [facility]  Clinical Lists Changes  Observations: Added new observation of NKA: T (02/20/2010 16:31)

## 2010-08-13 NOTE — Consult Note (Signed)
Summary: [place]   Imported By: Bubba Hales 11/01/2009 13:54:19  _____________________________________________________________________  External Attachment:    Type:   Image     Comment:   External Document

## 2010-08-13 NOTE — Progress Notes (Signed)
Summary: Triage  Phone Note Call from Patient Call back at Home Phone [phone]   Caller: Patient Call For: Dr. [name] Reason for Call: Talk to Nurse Summary of Call: having severe pain, vomiting...was told to call back if symptoms worsen Initial call taken by: Webb Laws,  April 22, 2010 2:15 PM  Follow-up for Phone Call        Patient is taking the max on painmeds and nausea medications.  Still with severe pain and vomiting that started this afternoon.  I have Dr [name] paged to discuss. Follow-up by: Barb Merino RN, [place],  April 22, 2010 2:35 PM  Additional Follow-up for Phone Call Additional follow up Details #1::        per Dr [name] patient to go to [facility] ER and inform Amy [place] PA , Amy Esterwood PA has asked me to get the patient a bed at [facility] for direct admit.  Patient is advised to go to [facility] admitting.  Amy Esterwood PA notified of bed. Additional Follow-up by: Barb Merino RN, [place],  April 22, 2010 2:50 PM

## 2010-08-14 DIAGNOSIS — I82409 Acute embolism and thrombosis of unspecified deep veins of unspecified lower extremity: Secondary | ICD-10-CM

## 2010-08-14 HISTORY — PX: OTHER SURGICAL HISTORY: SHX169

## 2010-08-14 HISTORY — DX: Acute embolism and thrombosis of unspecified deep veins of unspecified lower extremity: I82.409

## 2010-08-14 LAB — CULTURE, BLOOD (ROUTINE X 2)
Culture  Setup Time: 201201261514
Culture  Setup Time: 201201261514
Culture: NO GROWTH

## 2010-08-15 ENCOUNTER — Telehealth (INDEPENDENT_AMBULATORY_CARE_PROVIDER_SITE_OTHER): Payer: Self-pay | Admitting: *Deleted

## 2010-08-15 LAB — COMPREHENSIVE METABOLIC PANEL
ALT: 26 U/L (ref 0–35)
AST: 23 U/L (ref 0–37)
Alkaline Phosphatase: 53 U/L (ref 39–117)
CO2: 26 mEq/L (ref 19–32)
Chloride: 105 mEq/L (ref 96–112)
GFR calc Af Amer: >60 mL/min (ref >60)
GFR calc non Af Amer: >60 mL/min (ref >60)
Glucose, Bld: 112 mg/dL — ABNORMAL HIGH (ref 70–99)
Sodium: 139 mEq/L (ref 135–145)
Total Bilirubin: 0.2 mg/dL — ABNORMAL LOW (ref 0.3–1.2)

## 2010-08-15 LAB — MAGNESIUM: Magnesium: 1.8 mg/dL (ref 1.5–2.5)

## 2010-08-15 NOTE — Miscellaneous (Signed)
Summary: Certification & Plan of Care / [facility] of Care / [place] By: Rise Patience 07/19/2010 14:52:53  _____________________________________________________________________  External Attachment:    Type:   Image     Comment:   External Document

## 2010-08-15 NOTE — Progress Notes (Signed)
Summary: Triage  Phone Note Call from Patient Call back at Home Phone [phone]   Caller: Patient Call For: Dr. [name] Reason for Call: Talk to Nurse Summary of Call: Alot of abd pain and nausea Initial call taken by: Webb Laws,  July 04, 2010 2:56 PM  Follow-up for Phone Call        Spoke with patient. Nausea and abdominal pain started at 5:00 AM today. Abdominal pain is at the bottom of her stomach and it is a sharp pain. Patient is having nausea. Denies vomiting.  Patient states she is having dark brown stool in her bag  and mucous mixed with light brown stool from rectum. Patient is on Prednisone 10 mg. She took Promethazine for nausea without relief. Temperature 99. Patient instructed to call surgeon with above information also.  Feels so bad she would go to hospital if that is what she should do. Reviewed information with Nicoletta Ba, PA. Patient instructed to go to [facility] ER as per Nicoletta Ba, PA Follow-up by: Leone Payor RN,  July 04, 2010 3:50 PM  Additional Follow-up for Phone Call Additional follow up Details #1::        OK, I agree. Consider restarting Humira Additional Follow-up by: Lafayette Dragon MD,  July 05, 2010 6:36 PM

## 2010-08-15 NOTE — Assessment & Plan Note (Signed)
Summary: crohns f/u/dns   History of Present Illness Visit Type: Follow-up Visit Primary GI MD: [name] MD Primary Provider: Sherrye Payor, MD Requesting Provider: n/a Chief Complaint: Crohn's, , Nausea & vomiting that is every day History of Present Illness:   This is a 20 year old African American female with complicated Crohn's disease of the duodenum, colon and terminal ileum. 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 A CT scan of the abdomen in December showed a 5 cm right adnexal fluid collection. She stopped her Humira prior to surgery and has tapered her prednisone to 10 mg daily. Her ileostomy is functioning well but she has daily abdominal pain for which she takes oxycodone given by Dr. Brantley Stage. She has had Crohn's disease for 6 years. She is currently having  problems with her insurance trying to obtain total disability and medicid  to finance her surgery. She is planning to have her ileostomy reversed this month or next month.   GI Review of Systems    Reports abdominal pain, nausea, and  vomiting.     Location of  Abdominal pain: left side.    Denies acid reflux, belching, bloating, chest pain, dysphagia with liquids, dysphagia with solids, heartburn, loss of appetite, vomiting blood, weight loss, and  weight gain.      Reports diarrhea.     Denies anal fissure, black tarry stools, change in bowel habit, constipation, diverticulosis, fecal incontinence, heme positive stool, hemorrhoids, irritable bowel syndrome, jaundice, light color stool, liver problems, rectal bleeding, and  rectal pain.    Current Medications (verified): 1)  Nexium 40 Mg Cpdr (Esomeprazole Magnesium) .... Take 1 Tablet By Mouth Once A Day As Needed 2)  Promethazine Hcl 25 Mg Tabs (Promethazine Hcl) .... Take 1 Tablet By Mouth Every 4-6 Hour S As Needed For Nausea 3)  Zovirax 800 Mg Tabs (Acyclovir) .... Take As Directed 4)  Hydrocodone-Acetaminophen 5-325 Mg Tabs  (Hydrocodone-Acetaminophen) .... Take 1 Tab Every 6 Hours As Needed For Pain 5)  Ativan 1 Mg Tabs (Lorazepam) .... Take 1 Tablet By Mouth Three Times A Day 6)  Wellbutrin 75 Mg Tabs (Bupropion Hcl) .... Take 1 Tablet By Mouth At Bedtime 7)  Prednisone 10 Mg Tabs (Prednisone) .... Take One Tablet Daily 8)  Metoprolol Tartrate 50 Mg Tabs (Metoprolol Tartrate) .... Take .5 Tablet By Mouth Two Times A Day  Allergies (verified): No Known Drug Allergies  Past History:  Past Medical History: Reviewed history from 12/07/2009 and no changes required. CROHN'S DISEASE - dx age 10y anemia, iron defic  MD rooster: [facility] - [facility] - [facility]  Past Surgical History: Reviewed history from 06/05/2010 and no changes required. Cholecystectomy (2009) Ostomy   Family History: Reviewed history from 12/07/2009 and no changes required. Family History of Breast Cancer: Maternal Grandmother Family History of Diabetes:  Maternal uncle & cousin No FH of Colon Cancer  Social History: Reviewed history from 12/07/2009 and no changes required. Patient has never smoked.  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  Review of Systems  The patient denies allergy/sinus, anemia, anxiety-new, arthritis/joint pain, back pain, blood in urine, breast changes/lumps, change in vision, confusion, cough, coughing up blood, depression-new, fainting, fatigue, fever, headaches-new, hearing problems, heart murmur, heart rhythm changes, itching, menstrual pain, muscle pains/cramps, night sweats, nosebleeds, pregnancy symptoms, shortness of breath, skin rash,  sleeping problems, sore throat, swelling of feet/legs, swollen lymph glands, thirst - excessive , urination - excessive , urination changes/pain, urine leakage, vision changes, and voice change.         Pertinent positive and negative  review of systems were noted in the above HPI. All other ROS was otherwise negative.   Vital Signs:  Patient profile:   20 year old female Height:      64 inches Weight:      221.38 pounds BMI:     38.14 Pulse rate:   120 / minute Pulse rhythm:   regular BP sitting:   120 / 90  (left arm) Cuff size:   regular  Vitals Entered By: June McMurray [place] [name] (July 16, 2010 9:03 AM)  Physical Exam  General:  Well developed, well nourished, no acute distress. Eyes:  PERRLA, no icterus. Mouth:  No deformity or lesions, dentition normal. Neck:  Supple; no masses or thyromegaly. Lungs:  Clear throughout to auscultation. Heart:  Regular rate and rhythm; no murmurs, rubs,  or bruits. Abdomen:  healthy-appearing ileostomy, somewhat retracted. Stool in an appliance bag is heme positive. There's mild tenderness around the stoma but no distention. Bowel sounds are normal. Extremities:  No clubbing, cyanosis, edema or deformities noted. Skin:  acne. Psych:  Alert and cooperative. Normal mood and affect.   Impression & Recommendations:  Problem # 1:  PUD, HX OF (ICD-V12.71) Patient had Crohn's disease of the duodenum in the past but not on the last endoscopy. She is on Nexium 40 mg daily.  Problem # 2:  CHOLECYSTECTOMY, LAPAROSCOPIC, HX OF (ICD-V45.79) Assessment: Comment Only  Problem # 3:  CROHN'S DISEASE (ICD-555.9) Patient is status post diverting ileostomy with plans for correction next 2 months. We will taper down her prednisone to 9 mg a day and go down by 1 mg every week. We will give her refills of Phenergan.She has had an aggressive disease and will in the fuure need biologicals again  Patient Instructions: 1)  Please pick up your prescriptions at the pharmacy. Electronic prescription(s) has already been sent for promethazine and prednisone. 2)  restart iron supplements daily 3)  Please schedule a follow-up appointment in 4 weeks.  4)  Copy sent to : Dr Rowan Blase 5)  The  medication list was reviewed and reconciled.  All changed / newly prescribed medications were explained.  A complete medication list was provided to the patient / caregiver. Prescriptions: PROMETHAZINE HCL 25 MG TABS (PROMETHAZINE HCL) Take 1 tablet by mouth every 4-6 hour s as needed for nausea  #30 x 0   Entered by:   Madlyn Frankel CMA (AAMA)   Authorized by:   Lafayette Dragon MD   Signed by:   Madlyn Frankel CMA ([place]) on 07/16/2010   Method used:   [facility]        [facility] [address] [phone][facility] (retail)       [address]       [place], [place]  [address]       Ph: [phone] or [phone]       Fax: [phone]   RxID:   [id number] PREDNISONE 1 MG TABS (PREDNISONE) Take as directed  #100 x 0   Entered by:   Madlyn Frankel CMA ([place])   Authorized by:   Lafayette Dragon MD   Signed by:   Madlyn Frankel CMA ([place]) on 07/16/2010   Method used:   [facility]  [facility]        [facility] [address] [phone]* (retail)       [address]       [place], [place]  [address]       Ph: [phone] or [phone]       Fax: [phone]   RxID:   [id number]

## 2010-08-15 NOTE — Progress Notes (Signed)
Summary: Wonders about getting help from [place] to get Medicaid  Phone Note Call from Patient   Caller: Mom Coralyn Mark [phone] Call For: Dr [name] Summary of Call: Has a question about  getting help from [place] to be able to get Medicaid for [place]. Initial call taken by: Irwin Brakeman [facility],  July 30, 2010 12:50 PM  Follow-up for Phone Call        Spoke with patient's mother. She wanted Dr. [name] to know Mahlani is inpt at [facility]. Per patient's mother she will probably be there for awhile. Her heart rate is not good. Mother was asking about Medicaid and getting letters for this. Explained to her once they file, Medicaid will contact us for any information they need.  Follow-up by: Leone Payor RN,  July 30, 2010 3:35 PM  Additional Follow-up for Phone Call Additional follow up Details #1::        OK, I don't know anything about it. I defer it to the appropriate  office .I think she needs to be on Medicaid. Additional Follow-up by: Lafayette Dragon MD,  July 30, 2010 5:13 PM

## 2010-08-15 NOTE — Letter (Signed)
Summary: Turner Daniels MD  Turner Daniels MD   Imported By: Bubba Hales 07/30/2010 07:40:49  _____________________________________________________________________  External Attachment:    Type:   Image     Comment:   External Document

## 2010-08-15 NOTE — Progress Notes (Signed)
Summary: Question  Phone Note Call from Patient Call back at Home Phone [phone]   Call For: Dr [name] Reason for Call: Talk to Nurse Summary of Call: Patient wanted to make Dr [name] aware that her surgery for reversal of ileostomy is scheduled on 07/25/10 @ 2 pm, [facility]. Initial call taken by: Irwin Brakeman [facility],  July 16, 2010 1:06 PM  Follow-up for Phone Call        discussed with the pt. Follow-up by: Lafayette Dragon MD,  July 16, 2010 2:20 PM

## 2010-08-15 NOTE — Progress Notes (Signed)
Summary: results request  Phone Note Call from Patient Call back at Home Phone [phone]   Caller: Patient Call For: Dr. [name] Reason for Call: Talk to Nurse Summary of Call: pt would like CT results and would also like to have stool culture  ((message taken by Martinique Appelhans, EMR down)) Initial call taken by: Lucien Mons,  June 26, 2010 1:29 PM  Follow-up for Phone Call        Spoke with patient she wanted to be sure Dr. [name] has seen her CT results from 06/21/10. Did not see these results in EMR. Placed results in EMR. Patient also states her home health nurse thought she needed a stool culture because of the stoma pain she has been having. Please, advise. Follow-up by: Leone Payor RN,  June 26, 2010 2:20 PM  Additional Follow-up for Phone Call Additional follow up Details #1::        I have looked at the CY scan report, the area around the stoma appears  normal, no abcess or obstruction. New small area right overy, could be a cyst or fluid collection, probably not significant.  Will follow up with  pelvic ultrasoud after I see her on 07/2010. Additional Follow-up by: Lafayette Dragon MD,  June 26, 2010 11:18 PM    Additional Follow-up for Phone Call Additional follow up Details #2::    Message left for patient to call back.Leone Payor RN  June 27, 2010 9:02 AM Patient notified of Dr. Nichola Sizer recommendations. Follow-up by: Leone Payor RN,  June 27, 2010 1:20 PM

## 2010-08-16 LAB — BASIC METABOLIC PANEL
BUN: 10 mg/dL (ref 6–23)
CO2: 24 mEq/L (ref 19–32)
Calcium: 8.5 mg/dL (ref 8.4–10.5)
Creatinine, Ser: 0.45 mg/dL (ref 0.4–1.2)
GFR calc non Af Amer: >60 mL/min (ref >60)
Glucose, Bld: 202 mg/dL — ABNORMAL HIGH (ref 70–99)

## 2010-08-17 LAB — BASIC METABOLIC PANEL
BUN: 9 mg/dL (ref 6–23)
CO2: 25 mEq/L (ref 19–32)
Chloride: 105 mEq/L (ref 96–112)
Creatinine, Ser: 0.44 mg/dL (ref 0.4–1.2)
GFR calc Af Amer: >60 mL/min (ref >60)
Glucose, Bld: 128 mg/dL — ABNORMAL HIGH (ref 70–99)

## 2010-08-19 ENCOUNTER — Encounter (HOSPITAL_COMMUNITY): Payer: Self-pay | Admitting: Surgery

## 2010-08-19 ENCOUNTER — Inpatient Hospital Stay (HOSPITAL_COMMUNITY): Payer: Self-pay

## 2010-08-19 ENCOUNTER — Encounter (INDEPENDENT_AMBULATORY_CARE_PROVIDER_SITE_OTHER): Payer: Self-pay | Admitting: *Deleted

## 2010-08-19 LAB — CBC
Hemoglobin: 10.1 g/dL — ABNORMAL LOW (ref 12.0–15.0)
MCH: 24.2 pg — ABNORMAL LOW (ref 26.0–34.0)
MCHC: 30.1 g/dL (ref 30.0–36.0)
MCV: 80.1 fL (ref 78.0–100.0)
Platelets: 447 10*3/uL — ABNORMAL HIGH (ref 150–400)
RBC: 4.18 MIL/uL (ref 3.87–5.11)

## 2010-08-19 LAB — DIFFERENTIAL
Eosinophils Absolute: 0.1 10*3/uL (ref 0.0–0.7)
Lymphs Abs: 2 10*3/uL (ref 0.7–4.0)
Monocytes Absolute: 0.9 10*3/uL (ref 0.1–1.0)
Monocytes Relative: 9 % (ref 3–12)
Neutrophils Relative %: 69 % (ref 43–77)

## 2010-08-19 LAB — COMPREHENSIVE METABOLIC PANEL
Alkaline Phosphatase: 55 U/L (ref 39–117)
BUN: 9 mg/dL (ref 6–23)
Chloride: 105 mEq/L (ref 96–112)
Glucose, Bld: 104 mg/dL — ABNORMAL HIGH (ref 70–99)
Potassium: 4.2 mEq/L (ref 3.5–5.1)
Total Bilirubin: 0.2 mg/dL — ABNORMAL LOW (ref 0.3–1.2)

## 2010-08-19 LAB — PHOSPHORUS: Phosphorus: 3.7 mg/dL (ref 2.3–4.6)

## 2010-08-19 LAB — PREALBUMIN: Prealbumin: 17.1 mg/dL — ABNORMAL LOW (ref 17.0–34.0)

## 2010-08-19 LAB — MAGNESIUM: Magnesium: 1.8 mg/dL (ref 1.5–2.5)

## 2010-08-19 MED ORDER — IOHEXOL 300 MG/ML  SOLN: 100.0000 mL | Freq: Once | INTRAMUSCULAR | Status: AC | PRN

## 2010-08-21 NOTE — Progress Notes (Signed)
  Phone Note Other Incoming   Request: Send information Summary of Call: Request for records received from [facility]. Request forwarded to [facility].  03/14/2009 through present-[place]

## 2010-08-22 ENCOUNTER — Telehealth: Payer: Self-pay | Admitting: Internal Medicine

## 2010-08-22 LAB — PHOSPHORUS: Phosphorus: 4.5 mg/dL (ref 2.3–4.6)

## 2010-08-22 LAB — COMPREHENSIVE METABOLIC PANEL
Albumin: 2.4 g/dL — ABNORMAL LOW (ref 3.5–5.2)
BUN: 8 mg/dL (ref 6–23)
Calcium: 8.8 mg/dL (ref 8.4–10.5)
Creatinine, Ser: 0.6 mg/dL (ref 0.4–1.2)
GFR calc Af Amer: >60 mL/min (ref >60)
Total Bilirubin: 0.3 mg/dL (ref 0.3–1.2)
Total Protein: 6.2 g/dL (ref 6.0–8.3)

## 2010-08-22 LAB — URINALYSIS, ROUTINE W REFLEX MICROSCOPIC
Ketones, ur: NEGATIVE mg/dL
Nitrite: NEGATIVE
Specific Gravity, Urine: 1.008 (ref 1.005–1.030)
pH: 6 (ref 5.0–8.0)

## 2010-08-22 NOTE — Consult Note (Signed)
[name], [name]             ACCOUNT NO.:  [id number]  MEDICAL RECORD NO.:  [id number]          PATIENT TYPE:  INP  LOCATION:  2608                         FACILITY:  [facility]  PHYSICIAN:  Loralie Champagne, MD      DATE OF BIRTH:  1991/06/05  DATE OF CONSULTATION:  07/29/2010 DATE OF DISCHARGE:                                CONSULTATION   PRIMARY CARDIOLOGIST:  Thayer Headings, MD  HISTORY OF PRESENT ILLNESS:  This is a 20 year old with history of Crohn disease status post an ileocecectomy with diverting loop ileostomy in October 2011 who is now status post closure of ileostomy this admission. The patient was seen in consultation in October 2011 postoperatively by Dr. Acie Fredrickson for sinus tachycardia in the setting of pain and anxiety. Her echocardiogram at that time was unremarkable.  With treatment of her pain and anxiety, the sinus tachycardia subsided.  She did see Dr. Acie Fredrickson once in followup and seemed be doing well.  Currently, the patient is postop and has lot of abdominal pain which seems to be poorly controlled.  Her heart rate is in the 100s-140s based on the vitals that have been taken so far today.  She is not on telemetry.  The patient is quite anxious.  EKG today was reviewed and shows a tachycardia at a rate of 148.  There are clear P-waves that are same as the patient's sinus T- waves, so I suspect that this is sinus tachycardia.  The patient's blood pressure is preserved.  MEDICATIONS: 1. Wellbutrin. 2. Lovenox 40. 3. Lopressor 5 mg IV q.4 h. 4. Prednisone 4 mg daily. 5. Normal saline at 125 mL an hour.  PAST MEDICAL HISTORY: 1. Crohn disease.  The patient status post ileocecectomy and diverting     loop ileostomy after an episode of small bowel obstruction back in     the fall.  Currently, she is in the hospital status post closure of     the ileostomy. 2. History of UTI. 3. History of sinus tachycardia. 4. History of Candidal esophagitis. 5.  Iron-deficiency anemia. 6. Echocardiogram in October 2011 showed EF of 60-65% with mild left     ventricular hypertrophy and no significant valvular problems.  SOCIAL HISTORY:  The patient lives in [place].  She is a Development worker, community.  Her mother is here with her today.  She is single.  She does not smoke or drink alcohol, or use any drugs.  FAMILY HISTORY:  Significant for breast cancer, colon cancer, and diabetes.  REVIEW OF SYSTEMS:  All systems were reviewed and were negative except as noted history of present illness.  PHYSICAL EXAMINATION:  VITAL SIGNS:  Temperature 100.1, pulses in the 130s-140s.  There is one documentation of her heart rate getting up to 250, however, there is no EKG of this and the only EKG available shows sinus tachycardia at 148.  Blood pressure 125-138/80s-90s and oxygen saturation 97% on room air. GENERAL:  This is an uncomfortable-appearing, obese female. HEENT:  Normal exam. ABDOMEN:  The patient's abdomen is diffusely tender.  There is no rebound, no guarding.  It is soft. NECK:  There is  no thyromegaly or thyroid nodule.  No JVD. CARDIOVASCULAR:  Tachycardic and regular S1 and S2.  No murmur.  No gallop.  There is no peripheral edema.  No carotid bruit. EXTREMITIES:  No clubbing or cyanosis. SKIN:  Normal exam. MUSCULOSKELETAL:  Normal exam. LUNGS:  Clear to auscultation bilaterally with normal respiratory effort. NEUROLOGIC:  Alert and orient x3.  RADIOLOGY:  Chest x-ray shows no congestive heart failure or pneumonia. EKG shows sinus tachycardia at 148.  Prior EKG from October was reviewed.  The P-waves are similar to date to the EKG in October, at which time, the patient's heart rate was 100.  I do not think this is an ectopic atrial rhythm, I think this is sinus rhythm.  LABORATORY DATA:  White count 11.9, hematocrit 31.8, and platelets 483. Potassium 3.6 and creatinine 0.57.  IMPRESSION:  This is a 20 year old with sinus tachycardia in  the setting of severe pain and anxiety.  She had a similar episode postoperatively in October 2011.  PLAN:  Plan will be to hydrate her.  She has normal saline running at 125 mL an hour currently.  Continue IV Lopressor at current dose.  I think in this situation pain control will be key.  The patient says that her pain is not controlled and is very severe.  I  think it is reasonable to transfer her to a bed where she can be monitored by telemetry.  She also will need her anxiety controlled and I note that Ativan has been added back to her medications.     Loralie Champagne, MD     DM/MEDQ  D:  07/29/2010  T:  07/30/2010  Job:  [id number]  Electronically Signed by Loralie Champagne MD on 08/22/2010 08:51:03 AM

## 2010-08-23 LAB — URINE CULTURE: Culture  Setup Time: 201202091251

## 2010-08-25 LAB — BASIC METABOLIC PANEL
BUN: 8 mg/dL (ref 6–23)
Chloride: 103 mEq/L (ref 96–112)
Glucose, Bld: 106 mg/dL — ABNORMAL HIGH (ref 70–99)
Potassium: 3.8 mEq/L (ref 3.5–5.1)

## 2010-08-25 LAB — CBC
HCT: 29.4 % — ABNORMAL LOW (ref 36.0–46.0)
MCV: 78.6 fL (ref 78.0–100.0)
RBC: 3.74 MIL/uL — ABNORMAL LOW (ref 3.87–5.11)
RDW: 17.5 % — ABNORMAL HIGH (ref 11.5–15.5)
WBC: 7.5 10*3/uL (ref 4.0–10.5)

## 2010-08-26 ENCOUNTER — Ambulatory Visit: Payer: Self-pay | Admitting: Internal Medicine

## 2010-08-26 ENCOUNTER — Inpatient Hospital Stay (HOSPITAL_COMMUNITY): Payer: Self-pay

## 2010-08-26 DIAGNOSIS — K006 Disturbances in tooth eruption: Secondary | ICD-10-CM

## 2010-08-26 DIAGNOSIS — K036 Deposits [accretions] on teeth: Secondary | ICD-10-CM

## 2010-08-26 DIAGNOSIS — K0530 Chronic periodontitis, unspecified: Secondary | ICD-10-CM

## 2010-08-26 LAB — DIFFERENTIAL
Eosinophils Absolute: 0.4 10*3/uL (ref 0.0–0.7)
Eosinophils Relative: 5 % (ref 0–5)
Lymphs Abs: 1.8 10*3/uL (ref 0.7–4.0)
Monocytes Relative: 8 % (ref 3–12)

## 2010-08-26 LAB — COMPREHENSIVE METABOLIC PANEL
Albumin: 2.5 g/dL — ABNORMAL LOW (ref 3.5–5.2)
BUN: 6 mg/dL (ref 6–23)
Creatinine, Ser: 0.67 mg/dL (ref 0.4–1.2)
Total Bilirubin: 0.4 mg/dL (ref 0.3–1.2)
Total Protein: 6.6 g/dL (ref 6.0–8.3)

## 2010-08-26 LAB — CBC
MCH: 24.1 pg — ABNORMAL LOW (ref 26.0–34.0)
MCV: 78 fL (ref 78.0–100.0)
Platelets: 343 10*3/uL (ref 150–400)
RDW: 17.4 % — ABNORMAL HIGH (ref 11.5–15.5)

## 2010-08-29 NOTE — Progress Notes (Signed)
Summary: speak to nurse  Phone Note Call from Patient Call back at Home Phone [phone]   Caller: Patient Call For: Dr [name] Reason for Call: Talk to Nurse Summary of Call: Patient wants to speak to nurse regarding her appt on Monday. Patient is currently in the hosp. Initial call taken by: Ronalee Red,  August 22, 2010 3:12 PM  Follow-up for Phone Call        Patient calling to cancel Monday's appointment because she is still in the hospital. She will call us when she is d/c'ed from the hospital to r/s. Follow-up by: Leone Payor RN,  August 22, 2010 4:05 PM  Additional Follow-up for Phone Call Additional follow up Details #1::        OK Additional Follow-up by: Lafayette Dragon MD,  August 22, 2010 10:00 PM

## 2010-08-29 NOTE — Op Note (Signed)
Summary: Closure of Loop Ileostomy    NAME:  [name], [name]             ACCOUNT NO.:  [id number]      MEDICAL RECORD NO.:  [id number]          PATIENT TYPE:  INP      LOCATION:  5156                         FACILITY:  [facility]      PHYSICIAN:  Joyice Faster. Cornett, M.D.DATE OF BIRTH:  1990-09-24      DATE OF PROCEDURE:  07/25/2010   DATE OF DISCHARGE:                                  OPERATIVE REPORT         PREOPERATIVE DIAGNOSIS:  History of Crohn disease, status post   ileocecectomy and diverting loop ileostomy.      POSTOPERATIVE DIAGNOSIS:  History of Crohn disease, status post   ileocecectomy and diverting loop ileostomy.      PROCEDURE:  Closure of loop ileostomy.      SURGEON:  Marcello Moores A. Cornett, MD      ANESTHESIA:  General endotracheal anesthesia.      ASSISTANT:  Dickey Gave, MD      ESTIMATED BLOOD LOSS:  100 mL.      SPECIMEN:  Loop ileostomy to Pathology.      DRAINS:  None.      INDICATIONS FOR PROCEDURE:  The patient is a 20 year old female with   Crohn disease.  In mid October, she underwent a resection of her   terminal ileum and cecum, but was on high-dose immunosuppression and her   anastomosis was diverted to let it heal.  She has been weaned off and is   on a very low dose of prednisone 5 mg a day at the most and has actually   at times not taken her prednisone, she tells me.  In any event, we felt   it was time to close her ileostomy after it demonstrated her anastomosis   was patent and no evidence of leak.  She presents today for the above.   Risks, benefits, and alternative therapies were discussed.  Risk of   bleeding, infection, bowel injury, enterocutaneous fistula, exacerbation   of her Crohn's, anastomotic breakdown leading to pelvic sepsis and intra-   abdominal sepsis and potentially death are all potential issues here.   She understands the risk of doing this but would like to proceed.  Also   discussed wound problems and potential  hernia.      DESCRIPTION OF PROCEDURE:  The patient was brought to the operating   room.  After the induction of general anesthesia, the left lower   quadrant ileostomy was prepped and draped in sterile fashion.   Curvilinear incision was made above and below the ileostomy, and we   dissected down into the subcutaneous fat.  We went all the way down to   the fascia, but the ileostomy was densely adherent to the fascia and in   the process of mobilizing the ileostomy off the fascia, small serosal   tears were made both at the afferent and the efferent limbs.  We did not   violate the mucosa in its entirety.  This was extremely difficult due to   the very densed  adhesions she had.  We slowly were able to mobilize   ileostomy away from the fascia and then get a finger in the abdominal   cavity.  She had extensive intra-abdominal adhesions and was quite   stuck.  We were able to mobilize each afferent and efferent limbs far   enough that we could have enough bowel to create anastomosis.  She had   serosal tear on the afferent limb and I was able to repair this with 3-0   Vicryl pop-offs and fibrin glue was applied on top of this.  She also   had a much longer tear of the serosa in the efferent limb, but it was in   a position where anastomoses could be created through.  We elected to go   ahead and do a stable anastomosis.  The serosal edge of the efferent   limb was then secured to the afferent limb using 3-0 Vicryl pop-offs.   We then performed enterotomies in the loop and placed a GIA 75 stapling   device down it and then fired it where the mucosal defect was with the   suture line buttressing the posterior wall.  Her bowel was extremely   thin though.  Once this was created, we oversewed some small bleeders in   the posterior wall with 3-0 Vicryl.  We then went ahead and amputated   the ileostomy using GIA 75 with two loads.  The mesentery was taken down   with 3-0 Vicryl ties.  After  this was done, we closed the common   enterotomy with TA-60.  I went ahead and then oversewed the serosal   defect on the lateral edge of the efferent limb over to the afferent   limb to close the mucosal area that was exposed.  This was done with 3-0   Vicryl.  Fibrin glue was then placed across this anastomosis as well and   allowed to dry.  It was then put back in the abdominal cavity.   Irrigation was used to suction it out.  Hemostasis was achieved.  I   closed the abdominal wall defect with #1 running PDS x2.  I irrigated   out the wound and then loosely approximated the skin with staples and   then packed the wound open with 1-inch Iodoform packing.  All final   counts of sponge, needle, and instrument were found to be correct at   this portion of the case.  The patient was then awoken, extubated, and   taken to the recovery in satisfactory condition.  All final counts were   found to be correct.               Thomas A. Cornett, M.D.               TAC/MEDQ  D:  07/25/2010  T:  07/26/2010  Job:  [id number]      cc:   Lowella Bandy. [name], MD      Electronically Signed by Erroll Luna M.D. on 08/01/2010 11:17:08 AM

## 2010-08-29 NOTE — Consult Note (Signed)
[name], [name]             ACCOUNT NO.:  [id number]  MEDICAL RECORD NO.:  [id number]           PATIENT TYPE: INP  LOCATION: [facility]                               FACILITY: [facility]  PHYSICIAN:  Lenn Cal, D.D.S.DATE OF BIRTH:  Dec 07, 1990  DATE OF CONSULTATION:  08/26/2010 DATE OF DISCHARGE:                                CONSULTATION   [name] is a 20 year old female referred by Dr. Erroll Luna for a dental consultation.  The patient has been in the hospital since mid January, status post closure of an ileostomy.  The patient subsequently developed tooth pain coming from lower left wisdom tooth. Dental consultation requested to evaluate and provide treatment as indicated.  MEDICAL HISTORY: 1. History of Crohn disease with diagnosis at age of 51.     a. Status post ileocecetomy with diverticular loop ileostomy in        October 2011     b. Status post closure of the ileostomy with Dr.Cornett on July 25, 2010.     c. Protected hospitalization course as per chart review. 2. History of urinary tract infection. 3. History of sinus tachycardia. 4. History of candidal esophagitis. 5. Iron deficiency anemia. 6. Anxiety-Depressive disorder. 7. Status post cholecystectomy.  ALLERGIES/ADVERSE DRUG REACTIONS:  IRON DEXTRAN COMPLEX causes anaphylactic reaction.  MEDICATIONS: 1. Biotene rinses twice daily. 2. Wellbutrin 75 mg at bedtime. 3. Chlorhexidine rinses twice daily. 4. Boost 3 times daily. 5. Ativan 1 mg IV 3 times daily. 6. Nystatin 30 mL every 6 hours swish and swallow.  SOCIAL HISTORY:  The patient lives in [place].  The patient is an Financial controller.  The patient is single.  The patient is a nonsmoker, nondrinker.  The patient denies other illicit drug use.  FAMILY HISTORY:  Positive for breast cancer, colon cancer, and diabetes mellitus.  FUNCTIONAL ASSESSMENT:  The patient was independent for ADLs prior to this  admission.  REVIEW OF SYSTEMS:  This was reviewed from the chart for this admission.  DENTAL HISTORY:  Chief Complaint:  Dental consultation requested to evaluate tooth pain from the lower left wisdom tooth.  HISTORY OF PRESENT ILLNESS:  The patient gives a history of having tooth pain coming from the lower left wisdom tooth.  The patient points tooth #17 as the offending area.  The patient indicates that occasionally she gets a sharp pain which lasts until she can place Ambesol in the area. The patient states that restarts once the Ambesol "wears off."  The patient indicates that pain is reached an intensity of 9/10, but is currently 0/10 in intensity today.  The patient indicates that she last saw a dentist in 2010 and have her teeth cleaned.  The patient states that she saw Dr. Marina Gravel in [place], [place].  The patient does not seek regular dental care however.  The patient indicates that Dr. Mindi Junker has some family relation to her.  DENTAL EXAM:  GENERAL:  Patient is a well-developed, well-nourished female in no acute distress. VITAL SIGNS:  Blood pressure is 104/68, pulse rate 109, respirations are 22, temperature is 99.4. HEAD AND NECK EXAM:  The patient with no obvious submandibular lymphadenopathy.  The patient with some tenderness to palpation in the area of tooth #17.  The patient denies acute TMJ symptoms at this time however. INTRAORAL EXAM:  The patient with normal saliva.  I do not see any evidence of abscess formation within the mouth.  The patient does have partially erupted wisdom teeth.  The patient had some food debris in the area of the partially erupted wisdom tooth #17 today.  I do not see any evidence of purulence in the mouth however. DENTITION:  The patient has an intact dentition with all 4 remaining wisdom teeth.  The tooth #1, #16, #17 and #32 are impacted to some degree. DENTAL CARIES.  There are no obvious dental caries noted.  Would need  a full series of dental radiographs to rule out other incipient dental caries. PERIODONTAL: Patient with chronic periodontitis with plaque and calculus accumulations, no significant gingival recession and no significant tooth mobility is noted. ENDODONTIC:  The patient does not appear to be having acute pulpitis symptoms.  There is no evidence of periapical pathology, although there may be some incipient darkening around the roots of #32. CROWN OR BRIDGE:  There are no crown or bridge restorations. PROSTHODONTIC:  No need for partial dentures. OCCLUSION:  The patient with a generally intact dentition and stable occlusion at this time.Marland Kitchen  RADIOGRAPHIC INTERPRETATION:  A panoramic x-ray was taken by the department of Radiology.  There are no missing teeth.  There are impacted tooth #1, 16, #17 and #32.  There may be some incipient periapical radiolucency at the apex of tooth #32, but the patient denies acute symptoms in this area.  There are no obvious dental caries noted.  There are no other obvious areas of periapical pathology.  There are multiple diastemas noted.  ASSESSMENT: 1. The patient with some pain coming from the lower left wisdom tooth.     This most likely represents pericoronitis related to the partially     erupted third molar.  The patient with some food debris in this     area and it was recommended that the patient irrigate this area     with chlorhexidine rinses 3 times daily.  This will be changed in her     medication administration report today.  I do not see any evidence     of abscess formation and due to her significant problems with     gastrointestinal problems, I do not feel that prescription of     Augmentin 875 mg twice daily for 7 days is warranted at this time.     If the symptoms worsen, however, the patient could be placed on     antibiotic therapy for week's course of therapy as indicated. 2. Impacted wisdom tooth #1, #16, #17, and #32.  Suggested  the patient     to follow up with oral surgeon of her choice for evaluation for     extraction of these teeth either all at one time or perhaps the     left side then followed by the right side as time and     economics allow.  Dr. Tamela Oddi provided as a name to follow up     with who does have access to the radiology records from [facility] and     could provide a consultation for this treatment as indicated.  The     patient may contact her primary dentist with assistance to referral  to an Chief Financial Officer as well. 3. Chronic periodontitis with plaque and calculus.  Suggested the     patient to follow up with her primary dentist for an exam and     cleaning and routine care on an every 52-monthbasis, dental     radiographs can be obtained and a further evaluation for dental     caries at that time as well..Marland Kitchen PLAN/RECOMMENDATIONS:    1. I discussed the risks, benefits, complication of various treatment    options with the patient in relationship to her medical and dental conditions.      In light of the lack of evidence of periapical pathology involving #17     as well as lack of abscess formation noted in the mouth today, I would     recommend that the patient rinse with chlorhexidine rinses 3 times daily for     the remainder of this admission and approximately 1-2 months.  In that time,    the patient hopefully will schedule an appointment either with her    general dentist to be referred to an oral surgeon or may contact      Dr. CTamela Oddifor evaluation for extractions in his office as    per their policy. 2.  Discussion of findings with Dr. TErroll Luna  We discussed     current findings and the initial use of chlorhexidine rinses 3     times daily to aid disinfection of the oral cavity.  If the     swelling worsens or persists consideration could be given to once a     week's worth of Augmentin therapy until the patient does indeed     follow up with a dentist  or oral surgeon of her choice for     evaluation for extraction of the wisdom teeth. 3. Discussion with primary dentist as indicated if the patient so     desires.     RLenn Cal D.D.S.     RFK/MEDQ  D:  08/26/2010  T:  08/27/2010  Job:  [id number] cc:    Thomas A. Cornett, M.D.  CPlato DDS  KMarina Gravel DDS, PA  Electronically Signed by RTeena DunkD.D.S. on 08/29/2010 07:58:44 AM

## 2010-08-29 NOTE — Consult Note (Signed)
Summary: Sinus Tachycardia  NAME:  [name], [name]             ACCOUNT NO.:  [id number]      MEDICAL RECORD NO.:  [id number]          PATIENT TYPE:  INP      LOCATION:  2608                         FACILITY:  [facility]      PHYSICIAN:  Loralie Champagne, MD      DATE OF BIRTH:  [birth year]      DATE OF CONSULTATION:  07/29/2010   DATE OF DISCHARGE:                                    CONSULTATION         PRIMARY CARDIOLOGIST:  Thayer Headings, MD      HISTORY OF PRESENT ILLNESS:  This is a 20 year old with history of Crohn   disease status post an ileocecectomy with diverting loop ileostomy in   October 2011 who is now status post closure of ileostomy this admission.   The patient was seen in consultation in October 2011 postoperatively by   Dr. Acie Fredrickson for sinus tachycardia in the setting of pain and anxiety.   Her echocardiogram at that time was unremarkable.  With treatment of her   pain and anxiety, the sinus tachycardia subsided.  She did see Dr.   Acie Fredrickson once in followup and seemed be doing well.  Currently, the   patient is postop and has lot of abdominal pain which seems to be poorly   controlled.  Her heart rate is in the 100s-140s based on the vitals that   have been taken so far today.  She is not on telemetry.  The patient is   quite anxious.  EKG today was reviewed and shows a tachycardia at a rate   of 148.  There are clear P-waves that are same as the patient's sinus T-   waves, so I suspect that this is sinus tachycardia.  The patient's blood   pressure is preserved.      MEDICATIONS:   1. Wellbutrin.   2. Lovenox 40.   3. Lopressor 5 mg IV q.4 h.   4. Prednisone 4 mg daily.   5. Normal saline at 125 mL an hour.      PAST MEDICAL HISTORY:   1. Crohn disease.  The patient status post ileocecectomy and diverting       loop ileostomy after an episode of small bowel obstruction back in       the fall.  Currently, she is in the hospital status post closure of       the  ileostomy.   2. History of UTI.   3. History of sinus tachycardia.   4. History of Candidal esophagitis.   5. Iron-deficiency anemia.   6. Echocardiogram in October 2011 showed EF of 60-65% with mild left       ventricular hypertrophy and no significant valvular problems.      SOCIAL HISTORY:  The patient lives in [place].  She is a Forensic scientist.  Her mother is here with her today.  She is single.  She does   not smoke or drink alcohol, or use any drugs.      FAMILY HISTORY:  Significant for  breast cancer, colon cancer, and   diabetes.      REVIEW OF SYSTEMS:  All systems were reviewed and were negative except   as noted history of present illness.      PHYSICAL EXAMINATION:  VITAL SIGNS:  Temperature 100.1, pulses in the   130s-140s.  There is one documentation of her heart rate getting up to   250, however, there is no EKG of this and the only EKG available shows   sinus tachycardia at 148.  Blood pressure 125-138/80s-90s and oxygen   saturation 97% on room air.   GENERAL:  This is an uncomfortable-appearing, obese female.   HEENT:  Normal exam.   ABDOMEN:  The patient's abdomen is diffusely tender.  There is no   rebound, no guarding.  It is soft.   NECK:  There is no thyromegaly or thyroid nodule.  No JVD.   CARDIOVASCULAR:  Tachycardic and regular S1 and S2.  No murmur.  No   gallop.  There is no peripheral edema.  No carotid bruit.   EXTREMITIES:  No clubbing or cyanosis.   SKIN:  Normal exam.   MUSCULOSKELETAL:  Normal exam.   LUNGS:  Clear to auscultation bilaterally with normal respiratory   effort.   NEUROLOGIC:  Alert and orient x3.      RADIOLOGY:  Chest x-ray shows no congestive heart failure or pneumonia.   EKG shows sinus tachycardia at 148.  Prior EKG from October was   reviewed.  The P-waves are similar to date to the EKG in October, at   which time, the patient's heart rate was 100.  I do not think this is an   ectopic atrial rhythm, I think this is  sinus rhythm.      LABORATORY DATA:  White count 11.9, hematocrit 31.8, and platelets 483.   Potassium 3.6 and creatinine 0.57.      IMPRESSION:  This is a 20 year old with sinus tachycardia in the setting   of severe pain and anxiety.  She had a similar episode postoperatively   in October 2011.      PLAN:  Plan will be to hydrate her.  She has normal saline running at   125 mL an hour currently.  Continue IV Lopressor at current dose.  I   think in this situation pain control will be key.  The patient says that   her pain is not controlled and is very severe.  I  think it is   reasonable to transfer her to a bed where she can be monitored by   telemetry.  She also will need her anxiety controlled and I note that   Ativan has been added back to her medications.               Loralie Champagne, MD               DM/MEDQ  D:  07/29/2010  T:  07/30/2010  Job:  [id number]

## 2010-08-30 NOTE — Discharge Summary (Signed)
[name], [name]             ACCOUNT NO.:  [id number]  MEDICAL RECORD NO.:  [id number]           PATIENT TYPE:  I  LOCATION:  7412                         FACILITY:  [facility]  PHYSICIAN:  Joyice Faster. Female Iafrate, M.D.DATE OF BIRTH:  1991-05-27  DATE OF ADMISSION:  07/25/2010 DATE OF DISCHARGE:  08/27/2010                              DISCHARGE SUMMARY   ADMITTING DIAGNOSES:  History of Crohn disease with ileocecectomy and diverting loop ileostomy.  DISCHARGE DIAGNOSES: 1. History of Crohn disease with ileocecectomy and diverting loop     ileostomy. 2. History of supraventricular tachycardia. 3. Enterocutaneous fistula, resolved. 4. Chronic abdominal pain. 5. Chronic nausea.  PROCEDURES PERFORMED: 1. Closure of loop ileostomy. 2. Percutaneous intraabdominal fluid collection by Interventional     Radiology.  BRIEF HISTORY:  The patient is a 20 year old female, back in October underwent an ileocecectomy due to Crohn disease secondary to obstruction.  She had a prolonged hospital stay after that procedure and required a diverting loop ileostomy due to the high-dose immunosuppression she was on at that time.  She was seen back in the office, evaluated, and found her anastomosis had healed, presented on July 25, 2010, for closure of her loop ileostomy.  HOSPITAL COURSE:  Her hospital course was complicated by enterocutaneous fistula.  She developed fever.  Had some issues with her supraventricular tachycardia postop.  She was found to have on postop day #5 some drainage from the old ileostomy site, which looked like succus entericus.  She was placed on bowel rest and had a CT scan which showed intraabdominal fluid collection.  This was subsequently percutaneously drained.  She was placed on bowel rest and TNA at this point in time and was on Invanz for a total of 14 days while she was here.  Nothing grew out in fluid collection.  Her drainage from the enterocutaneous fistula  closed and slowed down rapidly.  She was kept on TNA for poor p.o. intake and protein-calorie malnutrition with a prealbumin being under 16 during her stay.  Once completing antibiotics, we slowly advanced her diet to liquids slowly.  It took about 2 weeks for her to get her up to full speed and eat enough to maintain herself. She was just on TNAs without all her wounds healed  She was on prednisone, this was stopped and this was low-dose prednisone anyway. She developed some pain at her wisdom teeth and Dr. Enrique Sack saw her for me and made arrangements for her to have a dental followup.  She was on chlorhexidine mouth wash for that.  At this point in time, her diet was advanced.  Her bowels were moving and she had an Eakin's pouch on the wound and that was taken off due to minimal drainage and it was changed to wet-to-dry dressing changes.  Overall, at this point in time, she is discharged to home, off TNA, tolerating a soft mechanical diet, having bowel movements, and no fever or chills.  White count was normal. Electrolytes normalized as well.  DISCHARGE INSTRUCTIONS:  She will back to see me in 7 to 10 days.  She will go home on chlorhexidine mouth  wash 0.12% three times a day, Ativan 1 mg daily as needed t.i.d., metoprolol 25 mg by mouth b.i.d., Nexium 40 mg daily, oxycodone and acetaminophen 7.5/325, one to two q.4 p.r.n. for pain, Phenergan 1 tablet by mouth three times a day as needed for nausea, Wellbutrin 75 mg daily.  She will eat a soft mechanical diet. She may shower and she may drive if need be.  Wound care will be wet-to-dry dressing change to left lower quadrant done by patient.  CONDITION ON DISCHARGE:  Improved.     [name], M.D.     TAC/MEDQ  D:  08/27/2010  T:  08/27/2010  Job:  [id number]  cc:   Lowella Bandy. [name], MD  Electronically Signed by Erroll Luna M.D. on 08/30/2010 06:44:24 PM

## 2010-08-31 ENCOUNTER — Inpatient Hospital Stay (HOSPITAL_COMMUNITY)
Admission: EM | Admit: 2010-08-31 | Discharge: 2010-09-04 | DRG: 300 | Disposition: A | Discharge status: Home / Self Care | Payer: Self-pay | Attending: Internal Medicine | Admitting: Internal Medicine

## 2010-08-31 ENCOUNTER — Encounter: Payer: Self-pay | Admitting: Internal Medicine

## 2010-08-31 ENCOUNTER — Emergency Department (HOSPITAL_COMMUNITY): Payer: Self-pay

## 2010-08-31 DIAGNOSIS — I498 Other specified cardiac arrhythmias: Secondary | ICD-10-CM | POA: Diagnosis present

## 2010-08-31 DIAGNOSIS — I808 Phlebitis and thrombophlebitis of other sites: Principal | ICD-10-CM | POA: Diagnosis present

## 2010-08-31 DIAGNOSIS — K219 Gastro-esophageal reflux disease without esophagitis: Secondary | ICD-10-CM | POA: Diagnosis present

## 2010-08-31 DIAGNOSIS — E876 Hypokalemia: Secondary | ICD-10-CM | POA: Diagnosis present

## 2010-08-31 DIAGNOSIS — B373 Candidiasis of vulva and vagina: Secondary | ICD-10-CM | POA: Diagnosis present

## 2010-08-31 DIAGNOSIS — K5090 Crohn's disease, unspecified, without complications: Secondary | ICD-10-CM | POA: Diagnosis present

## 2010-08-31 DIAGNOSIS — Z9049 Acquired absence of other specified parts of digestive tract: Secondary | ICD-10-CM

## 2010-08-31 DIAGNOSIS — I82C19 Acute embolism and thrombosis of unspecified internal jugular vein: Secondary | ICD-10-CM | POA: Insufficient documentation

## 2010-08-31 DIAGNOSIS — F341 Dysthymic disorder: Secondary | ICD-10-CM | POA: Diagnosis present

## 2010-08-31 DIAGNOSIS — D649 Anemia, unspecified: Secondary | ICD-10-CM | POA: Diagnosis present

## 2010-08-31 DIAGNOSIS — B3731 Acute candidiasis of vulva and vagina: Secondary | ICD-10-CM | POA: Diagnosis present

## 2010-08-31 LAB — BASIC METABOLIC PANEL
CO2: 24 mEq/L (ref 19–32)
Chloride: 105 mEq/L (ref 96–112)
GFR calc Af Amer: >60 mL/min (ref >60)
Potassium: 3.1 mEq/L — ABNORMAL LOW (ref 3.5–5.1)
Sodium: 138 mEq/L (ref 135–145)

## 2010-08-31 LAB — DIFFERENTIAL
Basophils Relative: 0 % (ref 0–1)
Eosinophils Absolute: 0.2 10*3/uL (ref 0.0–0.7)
Eosinophils Relative: 2 % (ref 0–5)
Lymphocytes Relative: 18 % (ref 12–46)
Lymphs Abs: 2.1 10*3/uL (ref 0.7–4.0)
Monocytes Absolute: 0.8 10*3/uL (ref 0.1–1.0)
Monocytes Relative: 7 % (ref 3–12)

## 2010-08-31 LAB — URINE MICROSCOPIC-ADD ON

## 2010-08-31 LAB — URINALYSIS, ROUTINE W REFLEX MICROSCOPIC
Nitrite: NEGATIVE
Protein, ur: NEGATIVE mg/dL
Urine Glucose, Fasting: NEGATIVE mg/dL
pH: 5.5 (ref 5.0–8.0)

## 2010-08-31 LAB — CBC
Hemoglobin: 10.7 g/dL — ABNORMAL LOW (ref 12.0–15.0)
Platelets: 500 10*3/uL — ABNORMAL HIGH (ref 150–400)
RBC: 4.35 MIL/uL (ref 3.87–5.11)
WBC: 11.3 10*3/uL — ABNORMAL HIGH (ref 4.0–10.5)

## 2010-08-31 LAB — CONVERTED CEMR LAB
CO2: 24 meq/L
Chloride: 105 meq/L
Hemoglobin: 10.7 g/dL
MCV: 79.8 fL
WBC: 11.3 10*3/uL

## 2010-08-31 MED ORDER — IOHEXOL 300 MG/ML  SOLN
100.0000 mL | Freq: Once | INTRAMUSCULAR | Status: AC | PRN
  Administered 2010-08-31: 100 mL via INTRAVENOUS

## 2010-09-01 LAB — COMPREHENSIVE METABOLIC PANEL
ALT: 10 U/L (ref 0–35)
AST: 14 U/L (ref 0–37)
Alkaline Phosphatase: 55 U/L (ref 39–117)
CO2: 23 mEq/L (ref 19–32)
Calcium: 8.9 mg/dL (ref 8.4–10.5)
Chloride: 109 mEq/L (ref 96–112)
GFR calc Af Amer: >60 mL/min (ref >60)
GFR calc non Af Amer: >60 mL/min (ref >60)
Potassium: 3.5 mEq/L (ref 3.5–5.1)
Sodium: 139 mEq/L (ref 135–145)

## 2010-09-01 LAB — PROTIME-INR
INR: 1.1 (ref 0.00–1.49)
Prothrombin Time: 14.4 seconds (ref 11.6–15.2)

## 2010-09-01 LAB — CBC
Hemoglobin: 9.6 g/dL — ABNORMAL LOW (ref 12.0–15.0)
MCHC: 31.1 g/dL (ref 30.0–36.0)
RBC: 3.89 MIL/uL (ref 3.87–5.11)
WBC: 11.7 10*3/uL — ABNORMAL HIGH (ref 4.0–10.5)

## 2010-09-02 ENCOUNTER — Telehealth: Payer: Self-pay | Admitting: Internal Medicine

## 2010-09-02 LAB — BASIC METABOLIC PANEL
BUN: 3 mg/dL — ABNORMAL LOW (ref 6–23)
GFR calc non Af Amer: >60 mL/min (ref >60)
Glucose, Bld: 96 mg/dL (ref 70–99)
Potassium: 3.4 mEq/L — ABNORMAL LOW (ref 3.5–5.1)

## 2010-09-02 LAB — CBC
Hemoglobin: 8.3 g/dL — ABNORMAL LOW (ref 12.0–15.0)
MCH: 23.9 pg — ABNORMAL LOW (ref 26.0–34.0)
MCHC: 30 g/dL (ref 30.0–36.0)
Platelets: 422 10*3/uL — ABNORMAL HIGH (ref 150–400)

## 2010-09-02 LAB — PROTIME-INR: Prothrombin Time: 19 seconds — ABNORMAL HIGH (ref 11.6–15.2)

## 2010-09-03 LAB — CBC
Hemoglobin: 8.6 g/dL — ABNORMAL LOW (ref 12.0–15.0)
MCH: 24.1 pg — ABNORMAL LOW (ref 26.0–34.0)
RBC: 3.57 MIL/uL — ABNORMAL LOW (ref 3.87–5.11)

## 2010-09-03 LAB — BASIC METABOLIC PANEL
BUN: <1 mg/dL — ABNORMAL LOW (ref 6–23)
CO2: 26 mEq/L (ref 19–32)
Calcium: 8.3 mg/dL — ABNORMAL LOW (ref 8.4–10.5)
Chloride: 109 mEq/L (ref 96–112)
Creatinine, Ser: 0.58 mg/dL (ref 0.4–1.2)
GFR calc Af Amer: >60 mL/min (ref >60)

## 2010-09-03 LAB — PROTIME-INR: Prothrombin Time: 18.1 seconds — ABNORMAL HIGH (ref 11.6–15.2)

## 2010-09-04 ENCOUNTER — Ambulatory Visit: Payer: Self-pay | Admitting: Internal Medicine

## 2010-09-04 ENCOUNTER — Encounter: Payer: Self-pay | Admitting: Internal Medicine

## 2010-09-04 LAB — CBC
HCT: 31.3 % — ABNORMAL LOW (ref 36.0–46.0)
Hemoglobin: 9.6 g/dL — ABNORMAL LOW (ref 12.0–15.0)
MCH: 24.3 pg — ABNORMAL LOW (ref 26.0–34.0)
RBC: 3.95 MIL/uL (ref 3.87–5.11)

## 2010-09-04 LAB — CONVERTED CEMR LAB
CO2: 27 meq/L
Chloride: 105 meq/L
Creatinine, Ser: 0.86 mg/dL
HCT: 31.3 %
Hemoglobin: 9.6 g/dL
Potassium: 3.4 meq/L
Sodium: 139 meq/L

## 2010-09-04 LAB — PROTIME-INR
INR: 1.66 — ABNORMAL HIGH (ref 0.00–1.49)
Prothrombin Time: 19.8 seconds — ABNORMAL HIGH (ref 11.6–15.2)

## 2010-09-04 LAB — BASIC METABOLIC PANEL
CO2: 27 mEq/L (ref 19–32)
Chloride: 105 mEq/L (ref 96–112)
GFR calc Af Amer: >60 mL/min (ref >60)
Sodium: 139 mEq/L (ref 135–145)

## 2010-09-04 NOTE — H&P (Signed)
[name], [name]             ACCOUNT NO.:  [id number]  MEDICAL RECORD NO.:  [id number]           PATIENT TYPE:  I  LOCATION:  1856                         FACILITY:  [facility]  PHYSICIAN:  Leta Baptist, MD            DATE OF BIRTH:  1991-06-28  DATE OF ADMISSION:  08/31/2010 DATE OF DISCHARGE:                             HISTORY & PHYSICAL   CHIEF COMPLAINT:  Neck pain and sore throat.  HISTORY OF PRESENT ILLNESS:  The patient is a 20 year old female who presents to the [facility] Emergency Room on August 31, 2010, complaining of sore throat for 2 weeks.  According to the patient, her sore throat has significantly worsened over the past 2 days.  She also complains of severe left-sided neck pain, preventing her from moving her neck and head.  It should be noted that the patient was recently discharged from [facility] after she underwent ileocolectomy and diverting loop ileostomy for Crohn disease.  She was discharged home on August 28, 2010.  She was hospitalized for almost a month.  Since her discharge, the patient complains of worsening of her sore throat. With the significant neck pain, the patient decided to seek treatment at the [facility] Emergency Room.  A CT scan was performed at the emergency room.  The CT showed thrombosis of the mid and inferior segments of the left internal jugular vein.  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  The patient was started on IV Zosyn and placed on anticoagulation.  PAST MEDICAL HISTORY: 1. Crohn disease. 2. Small-bowel obstruction. 3. Supraventricular tachycardia. 4. Chronic abdominal pain. 5. Gastroesophageal reflux disease. 6. Depression and anxiety. 7. History of recurrent urinary tract infection. 8. Iron-deficiency anemia. 9. History of candidal esophagitis.  PAST SURGICAL HISTORY: 1. Ileocolectomy with diverting loop ileostomy. 2. Cholecystectomy. 3. Appendectomy.  HOME MEDICATIONS: 1. Lopressor. 2.  Ativan. 3. Wellbutrin. 4. Percocet. 5. Phenergan p.r.n.  ALLERGIES: 1. DEXAMETHASONE 2. IRON 3. DEXTRAN COMPLEX.  SOCIAL HISTORY:  The patient is single and lives in [place].  She is a Ship broker.  She denies any alcohol, tobacco, or illegal drug abuse.  FAMILY HISTORY:  Positive for breast cancer and colon cancer.  PHYSICAL EXAMINATION:  VITAL SIGNS:  Temperature 98.2, pulse 96, respiration 18, and blood pressure 119/85. GENERAL:  The patient is a well-nourished and well-developed 20 year old [place] female, in no acute distress.  She is alert and oriented x3. HEENT:  Her pupils are equal, round, and reactive to light.  Extraocular motion is intact.  Examination of the ears shows normal auricles and external auditory canals.  Moderate amount of cerumen is noted.  Nasal examination shows normal mucosa, septum, and turbinates.  Oral cavity examination shows normal lips, gums, tongue, oral cavity, and oropharyngeal mucosa.  No significant tonsillar erythema or edema is noted.  The patient's floor of mouth and base of tongue soft to palpation. NECK:  Palpation of the neck reveals tender area around the left jugular vein.  No skin erythema or drainage is noted.  The patient's cervical motion is restricted secondary to left neck tenderness. NEUROLOGIC:  Cranial nerves II-XII are grossly intact.  PROCEDURE PERFORMED:  Fiberoptic laryngoscopy.  ANESTHESIA:  Topical Xylocaine and Neo-Synephrine.  DESCRIPTION:  The patient was placed upright in the hospital bed.  Both nasal cavities sprayed with topical Xylocaine and Neo-Synephrine.  A 4 mm flexible Olympus scope was used for examination.  The scope was advanced past the right nostril into the right nasal cavity.  Nasal mucosa, septum, and turbinates are all normal.  The scope was advanced past the choanal opening into the pharynx.  The nasopharynx, oropharynx, and hypopharynx are all within normal limits.  No asymmetry or  mucosal abnormality is noted.  The supraglottis and vocal cords are also normal. Both vocal cords are mobile and symmetric.  No erythema is noted.  No suspicious mass or lesion is noted.  The scope was withdrawn.  The patient tolerated the procedure well without difficulty.  IMPRESSION:  Left internal jugular vein thrombophlebitis.  There is no evidence of acute pharyngitis or tonsillitis at this time.  The patient does have impacted molars bilaterally.  However, they do not appear to be acutely infected.  RECOMMENDATIONS: 1. Agree with IV antibiotics and anticoagulation. 2. No acute ENT intervention is needed at this time. 3. I will continue to follow the patient while she is in-house.     Leta Baptist, MD    ST/MEDQ  D:  09/01/2010  T:  09/01/2010  Job:  [id number]  Electronically Signed by Leta Baptist MD on 09/04/2010 11:36:19 AM

## 2010-09-05 ENCOUNTER — Encounter (INDEPENDENT_AMBULATORY_CARE_PROVIDER_SITE_OTHER): Payer: Self-pay

## 2010-09-05 ENCOUNTER — Encounter: Payer: Self-pay | Admitting: Internal Medicine

## 2010-09-05 DIAGNOSIS — I82C19 Acute embolism and thrombosis of unspecified internal jugular vein: Secondary | ICD-10-CM

## 2010-09-05 DIAGNOSIS — Z7901 Long term (current) use of anticoagulants: Secondary | ICD-10-CM

## 2010-09-05 LAB — CONVERTED CEMR LAB

## 2010-09-06 ENCOUNTER — Ambulatory Visit (INDEPENDENT_AMBULATORY_CARE_PROVIDER_SITE_OTHER): Payer: Self-pay | Admitting: Internal Medicine

## 2010-09-06 ENCOUNTER — Encounter: Payer: Self-pay | Admitting: Internal Medicine

## 2010-09-06 ENCOUNTER — Telehealth: Payer: Self-pay | Admitting: Internal Medicine

## 2010-09-06 DIAGNOSIS — R110 Nausea: Secondary | ICD-10-CM | POA: Insufficient documentation

## 2010-09-06 DIAGNOSIS — K5090 Crohn's disease, unspecified, without complications: Secondary | ICD-10-CM

## 2010-09-06 DIAGNOSIS — I82C19 Acute embolism and thrombosis of unspecified internal jugular vein: Secondary | ICD-10-CM

## 2010-09-07 LAB — CULTURE, BLOOD (ROUTINE X 2): Culture: NO GROWTH

## 2010-09-09 ENCOUNTER — Encounter: Payer: Self-pay | Admitting: Cardiology

## 2010-09-09 ENCOUNTER — Encounter (INDEPENDENT_AMBULATORY_CARE_PROVIDER_SITE_OTHER): Payer: Self-pay

## 2010-09-09 ENCOUNTER — Telehealth: Payer: Self-pay | Admitting: Internal Medicine

## 2010-09-09 ENCOUNTER — Other Ambulatory Visit: Payer: Self-pay | Admitting: Internal Medicine

## 2010-09-09 DIAGNOSIS — R209 Unspecified disturbances of skin sensation: Secondary | ICD-10-CM

## 2010-09-09 DIAGNOSIS — R079 Chest pain, unspecified: Secondary | ICD-10-CM | POA: Insufficient documentation

## 2010-09-09 DIAGNOSIS — I749 Embolism and thrombosis of unspecified artery: Secondary | ICD-10-CM

## 2010-09-09 DIAGNOSIS — Z7901 Long term (current) use of anticoagulants: Secondary | ICD-10-CM

## 2010-09-09 LAB — CONVERTED CEMR LAB
INR: 1.8
POC INR: 1.8

## 2010-09-09 NOTE — H&P (Addendum)
NAMEOTHELLO, SGROI             ACCOUNT NO.:  [id number]  MEDICAL RECORD NO.:  [id number]           PATIENT TYPE:  I  LOCATION:  7412                         FACILITY:  [facility]  PHYSICIAN:  Arlyss Repress, MD        DATE OF BIRTH:  1991/05/19  DATE OF ADMISSION:  08/31/2010 DATE OF DISCHARGE:                             HISTORY & PHYSICAL   PRIMARY CARE PHYSICIAN:  Dr. Teena Dunk.  GI DOCTOR:  Lowella Bandy. [name], MD  SURGEON:  Joyice Faster. Cornett, M.D., [facility].  CHIEF COMPLAINT:  Sore throat for 1 week.  HISTORY OF PRESENT ILLNESS:  The patient is a 20 year old female who was just discharged from [facility] on February 15th for history of Crohn disease with ileocolectomy and a diverting loop ileostomy. The patient reports she was in hospital approximately about a month.  During the end of her hospital course, she did have some sore throat and left-sided facial swelling and it appears that a dental consultation was obtained before her discharge.  It was noted that the patient did have some pain from her lower left wisdom tooth representing a peritonitis relating to partially erupted 3rd molar.  The patient also had impacted wisdom tooth 1, 16, 17, and 32, and I have suggested that the patient followup with oral surgeon to evaluate for extraction of her teeth.  Also, the patient had chronic peritonitis with plaque and calculus, and she also was recommended to follow up with primary dentist for exam and cleaning; however, after speaking with the patient in detail they were not aware of these findings and they did not make any appointments with any oral surgeon or dentist.  The patient returns to [facility] with the above complaints.  The patient states "I could not lift my head up.  It hurts when I talk, breath, or swallow and this has been going on for approximately 1 day."  She also reports she had a fever of 102.  She took her p.r.n. pain  meds oxycodone, which at that point she said she went to sleep, did not recheck her temperature. The patient also reports her neck hurts.  She reports her pain is 10/10. During her hospitalization, the patient did have approximately 2 PICC lines in both upper extremities.  She also reports symptoms of chills and sweats, nasal discharge and sore throat during her hospitalization. She also complained of shortness of breath.  The patient denies any chest pain, any coughing, any wheezing, any urinary frequency, weakness, numbness, any nausea or vomiting, hematemesis, heat or cold intolerances.  PAST MEDICAL HISTORY: 1. Crohn disease. 2. Small-bowel obstruction. 3. Supraventricular tachycardia 4. Intracutaneous fistula. 5. Chronic abdominal pain. 6. Chronic nausea. 7. Chronic peritonitis with plaque and calculus. 8. Protein-calorie malnutrition. 9. GERD. 10.Depression and anxiety. 11.Urinary tract infection. 12.Iron-deficiency anemia. 13.Candidal esophagitis.  PAST SURGICAL HISTORY: 1. Percutaneous intraabdominal fluid collection by arm. 2. Ileocolectomy and diverting loop ileostomy. 3. Closure of loop ileostomy. 4. Cholecystectomy. 5. Appendectomy.  CURRENT MEDICATIONS: 1. Lopressor 25 mg twice daily. 2. Ativan 1 mg 3 times a day. 3. Wellbutrin 75 mg  once a day. 4. Oxycodone/acetaminophen as needed. 5. Phenergan as needed.  ALLERGIES: 1. DEXAMETHASONE causes vaginal itching after given via IV meds. 2. IRON. 3. DEXTRAN COMPLEX causes anaphylactic reaction.  SOCIAL HISTORY:  She lives in [place].  She is a Ship broker.  She is single, no children.  She denies any alcohol, tobacco, or drug abuse.  FAMILY HISTORY:  Positive for breast cancer in maternal grandmother, diabetes in maternal uncle and cousin.  Family history of colon cancer. She has 1 sibling.  CODE STATUS:  Full code.  REVIEW OF SYSTEMS:  All systems have been reviewed and are negative otherwise stated in  HPI.  PHYSICAL EXAMINATION:  VITAL SIGNS:  Temperature is 99.3, pulse 120, respirations 24, blood pressure is 153/100, currently room air at 100%. GENERAL:  A 20 year old female lying in bed.  Mother is at the bedside complaining of neck pain and difficulty swallowing. HEENT/NECK:  Conjunctivae are pink.  Tongue is moist what appears to be candidiasis.  Neck is tender to touch on the left side.  Left side of the face appears to be slightly swollen. HEART/LUNGS:  Heart is tachy.  No heaves or thrills noted.  Lungs are clear to auscultation bilaterally. ABDOMEN:  Obese, soft.  Does have a midline incision.  Does have a 5.5 x 2.5 cm wound, which appears to be beefy red.  No foul odor noted.  No drainage noted. SKIN/LYMPH:  Skin is warm and dry.  No rashes, no lesions, no lymphadenopathy noted. EXTREMITIES:  +2 pulses.  Movement of extremities x4.  No edema noted. NEUROLOGIC/PSYCHIATRIC:  Alert and oriented x3.  Cranial nerves appear to be intact.  LABORATORY DATA:  Head CT; acute left internal jugular vein thrombosis. 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  PT is 14.4, INR is 1.10. CBC; WBC is 11.3, hemoglobin is 10.7, hematocrit of 34.7, platelets 500. Sodium is 138, potassium of 3.1, chloride 105, CO2 is 24, glucose is 88, BUN is 4, creatinine 0.7.  Calcium is 9.3.  Urine microscopy; many squamous epithelial cells.  WBC is 11-20.  Bacteria is few.  Urinalysis is negative.  ASSESSMENT/PLAN: 34. 20 year old female presented with deep venous thrombosis and a     right IJ with neck pain and difficulty swallowing.  The patient was     started on Lovenox and Coumadin.  Pharmacy will dose.  We will     check a daily PT and INR.  The patient will be also admitted to     [facility] with telemetry.  We will obtain blood cultures.     The patient will be empirically started on Zosyn IV.  We will give     the patient p.r.n. pain meds.  The patient will be made n.p.o. and     will  start normal saline at 100 mL an hour. 2. Hypokalemia.  The patient's potassium is 3.1.  We will give 4 units     of potassium, 10 mEq IV.  We will continue to monitor via labs. 3. Status post closure of ileostomy with a 5.5 x 2.5 cm abdominal     wound.  We will consult Wound Care for treatment. 4. Esophageal candidiasis.  We will order nystatin oral solution. 5. History of supraventricular tachycardia.  The patient is currently     on beta blockers.  We will change her to 5 mg IV Lopressor q.6     h./p.r.n. for treatment.  However, we will hold if heart rate is  less than 60 or systolic blood pressure is less than 110 mmHg. 6. Anxiety and depression.  The patient will be given Ativan 1 mg IV     t.i.d./p.r.n. 7. Prophylactically, the patient will be placed on Lovenox and PPI. 8. Spoke with Dr. Jani Gravel regarding his physical assessment and     plan.  Dr. Jani Gravel has also examined the patient and these are     his above findings.  The above interventions are embarked upon we will let the hospital course determine further action.    ______________________________ Raelyn Number, NP   ______________________________ Arlyss Repress, MD    IB/MEDQ  D:  09/01/2010  T:  09/01/2010  Job:  [id number]  Electronically Signed by Raelyn Number  on 09/09/2010 07:31:32 PM Electronically Signed by Jani Gravel MD on 10/02/2010 10:29:55 PM

## 2010-09-10 ENCOUNTER — Ambulatory Visit (INDEPENDENT_AMBULATORY_CARE_PROVIDER_SITE_OTHER): Payer: Self-pay | Admitting: Nurse Practitioner

## 2010-09-10 ENCOUNTER — Encounter: Payer: Self-pay | Admitting: Nurse Practitioner

## 2010-09-10 DIAGNOSIS — K5090 Crohn's disease, unspecified, without complications: Secondary | ICD-10-CM

## 2010-09-10 NOTE — Assessment & Plan Note (Signed)
Summary: PER MARI[facility]TE/CASE MANAGEMENT--POST HOSP---STC   Vital Signs:  Patient profile:   20 year old female Weight:      219.0 pounds (99.55 kg) O2 Sat:      93 % on Room air Temp:     99.4 degrees F (37.44 degrees C) oral Pulse rate:   108 / minute BP sitting:   112 / 78  (left arm) Cuff size:   large  Vitals Entered By: Tomma Lightning RMA (September 06, 2010 11:40 AM)  O2 Flow:  Room air CC: Hosp follow-up Is Patient Diabetic? No Pain Assessment Patient in pain? no        Primary Care Provider:  Gwendolyn Grant, MD  CC:  Hosp follow-up.  History of Present Illness: here for hosp f/u - at [facility] 2/18[facility]22 for thrombosis L IJ vein - now in CC to manage same still with residual left neck pain but improved  prior to that at [facility] 1/06/14/13 for closure of ileocolectomy loop per DC summary 1. History of Crohn disease with ileocecectomy and diverting loop     ileostomy. 2. History of supraventricular tachycardia. 3. Enterocutaneous fistula, resolved. 4. Chronic abdominal pain. 5. Chronic nausea. reports increase in usual nausea since home - not improved with phenergan - ?other med - has done well with reglan in past - no fever, no vomitting - no change in bowels - denies freq use of narcotics  missed f/u with GI and usrg due to repeat hosp - needs to resched each  also requests substitution for yeast med rx'd at hosp dc for vaginal itch symptoms due to $ -   Preventive Screening-Counseling & Management  Alcohol-Tobacco     Alcohol drinks/day: <1     Alcohol Counseling: not indicated; use of alcohol is not excessive or problematic     Smoking Status: never     Tobacco Counseling: not indicated; no tobacco use  Caffeine-Diet-Exercise     Does Patient Exercise: yes     Times/week: 4     Exercise Counseling: not indicated; exercise is adequate     Depression Counseling: not indicated; screening negative for depression  Clinical Review Panels:  Immunizations   Last PPD Date  Read:  02/18/2010 (02/15/2010)   Last PPD Result:  negative (02/15/2010)  CBC   WBC:  8.3 (09/04/2010)   RBC:  3.95 (09/04/2010)   Hgb:  9.6 (09/04/2010)   Hct:  31.3 (09/04/2010)   Platelets:  498 (09/04/2010)   MCV  79.2 (09/04/2010)   MCHC  32.8 (06/27/2010)   RDW  16.8 (09/04/2010)   PMN:  57.1 (06/27/2010)   Lymphs:  30.5 (06/27/2010)   Monos:  8.8 (06/27/2010)   Eosinophils:  3.4 (06/27/2010)   Basophil:  0.2 (06/27/2010)  Complete Metabolic Panel   Glucose:  90 (09/04/2010)   Sodium:  139 (09/04/2010)   Potassium:  3.4 (09/04/2010)   Chloride:  105 (09/04/2010)   CO2:  27 (09/04/2010)   BUN:  1 (09/04/2010)   Creatinine:  0.86 (09/04/2010)   Albumin:  3.2 (09/19/2009)   Total Protein:  7.3 (09/19/2009)   Calcium:  8.8 (09/04/2010)   Total Bili:  0.2 (09/19/2009)   Alk Phos:  57 (09/19/2009)   SGPT (ALT):  16 (09/19/2009)   SGOT (AST):  21 (09/19/2009)   Current Medications (verified): 1)  Nexium 40 Mg Cpdr (Esomeprazole Magnesium) .... Take 1 Tablet By Mouth Once A Day As Needed 2)  Promethazine Hcl 25 Mg Tabs (Promethazine Hcl) .... Take 1 Tablet By  Mouth Every 4-6 Hour S As Needed For Nausea 3)  Zovirax 800 Mg Tabs (Acyclovir) .... Take As Directed 4)  Ativan 1 Mg Tabs (Lorazepam) .... Take 1 Tablet By Mouth Three Times A Day 5)  Wellbutrin 75 Mg Tabs (Bupropion Hcl) .... Take 1 Tablet By Mouth At Bedtime 6)  Augmentin 875-125 Mg Tabs (Amoxicillin-Pot Clavulanate) .... Take 1 Two Times A Day 7)  Coumadin 5 Mg Tabs (Warfarin Sodium) .... Sunday - 0.5 Tab, Monday - 0.5 Tab, Tuesday - 0.5 Tab, Wednesday - 0.5 Tab, Thursday - 0.5 Tab, Friday - 0.5 Tab, Saturday - 0.5 Tab 8)  Oxycodone-Acetaminophen 7.5-325 Mg Tabs (Oxycodone-Acetaminophen) .... Take 1-2 By Mouth Every 4 Hours As Needed 9)  Metoprolol Tartrate 25 Mg Tabs (Metoprolol Tartrate) .... Take 1 Two Times A Day  Allergies (verified): 1)  ! Iron 2)  ! 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  The patient denies fever, syncope, peripheral edema, headaches, melena, and hematochezia.    Physical Exam  General:  overweight-appearing.  alert, well-developed, well-nourished, and cooperative to examination.   niece at side Head:  Normocephalic and atraumatic without obvious abnormalities. No apparent alopecia or balding. Eyes:  vision grossly intact; pupils equal, round and reactive to light.  conjunctiva and lids normal.    Neck:  supple, full ROM, no masses, no thyromegaly; no thyroid nodules or tenderness. no JVD or carotid bruits.   Lungs:  normal respiratory effort, no intercostal retractions or use of accessory muscles; normal breath sounds bilaterally - no crackles and no wheezes.    Heart:  normal rate, regular rhythm, no murmur, and no rub. BLE without edema. Abdomen:  obese, soft, non-tender, normal bowel sounds, no distention; no masses and no appreciable hepatomegaly or splenomegaly.  healing midline scar and dsg LLQ c/d/i   Impression & Recommendations:  Problem # 1:  NAUSEA, CHRONIC (ICD-787.02)  add reglan to promethazine - erx done - exam benign  exam bening - 2 recent hosp and each dc summary reviewed arrange/resched GI f/u for next week or next  available  Discussed symptom control.   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 [name] 07/2010: She has had an aggressive disease and will in the future need biologicals again  Orders: Gastroenterology Referral (GI)  Complete Medication List: 1)  Nexium 40 Mg Cpdr (Esomeprazole magnesium) .... Take 1 tablet by mouth once a day  as needed 2)  Promethazine Hcl 25 Mg Tabs (Promethazine hcl) .... Take 1 tablet by mouth every 4-6 hour s as needed for nausea 3)  Zovirax 800 Mg Tabs (Acyclovir) .... Take as directed 4)  Ativan 1 Mg Tabs (Lorazepam) .... Take 1 tablet by mouth three times a day 5)  Wellbutrin 75 Mg Tabs (Bupropion hcl) .... Take 1 tablet by mouth at bedtime 6)  Augmentin 875-125 Mg Tabs (Amoxicillin-pot clavulanate) .... Take 1 two times a day 7)  Coumadin 5 Mg Tabs (Warfarin sodium) .... Sunday - 0.5 tab, monday - 0.5 tab, tuesday - 0.5 tab, wednesday - 0.5 tab, thursday - 0.5 tab, friday - 0.5 tab, saturday - 0.5 tab 8)  Oxycodone-acetaminophen 7.5-325 Mg Tabs (Oxycodone-acetaminophen) .... Take 1-2 by mouth every 4 hours as needed 9)  Metoprolol Tartrate 25 Mg Tabs (Metoprolol tartrate) .... Take 1 two times a day 10)  Sm Miconazole 7 100 Mg Supp (Miconazole nitrate) .... Apply pv once daily x 14d 11)  Reglan 5 Mg Tabs (Metoclopramide hcl) .... 1 by mouth four times a day for nausea before meals  Patient Instructions: 1)  it was good to see you today. 2)  hospitalization reviewed -  3)  use generic reglan for nausea and vaginal cream for yeast - your prescriptions have been electronically submitted to your pharmacy. Please  take as directed. Contact our office if you believe you're having problems with the medication(s).  4)  no other medication changes today 5)  we'll make referral to Dr. Brodie for followup next week. Our office will contact you regarding this appointment once made.  6)  Please schedule a follow-up appointment in 6-12 weeks to continue review, call sooner if problems.  Prescriptions: REGLAN 5 MG TABS (METOCLOPRAMIDE HCL) 1 by mouth four times a day for nausea before meals  #60 x 1   Entered and Authorized by:   Valerie A Leschber MD   Signed by:   Valerie A Leschber MD on 09/06/2010   Method used:   Electronically to        [facility] [address] [id number]       [address]       [place]       [place], [place]  [address]       Ph: [phone] or [phone]       Fax: [phone]   RxID:   [id number] SM MICONAZOLE 7 100 MG SUPP (MICONAZOLE NITRATE) apply PV once daily x 14d  #14 x 0   Entered and Authorized by:   Valerie A Leschber MD   Signed by:   Valerie A Leschber MD on 09/06/2010   Method used:   [facility]ically to        [facility] [id number]       [address]       [place], [place]  [address]       Ph: [phone] or [phone]       Fax: [phone]   RxID:   [id number]    Orders Added: 1)  Est. Patient Level IV [78676] 2)  Prescription Created Electronically [[id number]] 3)  Gastroenterology Referral [GI]

## 2010-09-10 NOTE — Progress Notes (Signed)
Summary: Triage  Phone Note From Other Clinic   Caller: Debra @ Dr. Asa Lente [phone] Call For: Dr. [name] Summary of Call: Requesting pt. be seen next week for Crohn's disease Initial call taken by: Webb Laws,  September 06, 2010 3:20 PM  Follow-up for Phone Call        Scheduled patient with [name], RNP on 09/10/2010 at 9:30 AM. Left a message for Hilda Blades to call me. Leone Payor, RN 09/06/10 3:55 PM  Additional Follow-up for Phone Call Additional follow up Details #1::        Hilda Blades given the appointment date and time for patient. Additional Follow-up by: Leone Payor RN,  September 06, 2010 4:18 PM

## 2010-09-10 NOTE — Medication Information (Signed)
Summary: New to coumadin for jugular vein thrombosis/kh  Anticoagulant Therapy  Managed by: Doran Stabler, PharmD PCP: Sherrye Payor, MD Supervising MD: Lovena Le MD, Carleene Overlie Indication 1: Jugular Vein Thrombosis  Lab Used: [facility] Site: [place] INR POC 2.9 INR RANGE 2-3  Dietary changes: yes    Health status changes: yes       Details: new dx of Lemierre's syndrome  Bleeding/hemorrhagic complications: no    Recent/future hospitalizations: yes       Details: Patient just discharged from the hospital with jugular vein thrombosis. Has had previous recent prolonged hospital stays, closure of loop ileostomy  Any changes in medication regimen? no    Recent/future dental: no  Any missed doses?: no       Is patient compliant with meds? yes      Comments: Patient just started on coumadin, was just discharged from the hospital yesterday with jugular vein thrombosis. In the hospital the patient received 2 doses of 5 mg and 1 dose of 7.5 mg. Today is day 5/5 of overlap with lovenox.  INR trend in hopital 1.1 to 1.57 to 1.48 to 1.66 yesterday.   Current Medications (verified): 1)  Nexium 40 Mg Cpdr (Esomeprazole Magnesium) .... Take 1 Tablet By Mouth Once A Day As Needed 2)  Promethazine Hcl 25 Mg Tabs (Promethazine Hcl) .... Take 1 Tablet By Mouth Every 4-6 Hour S As Needed For Nausea 3)  Zovirax 800 Mg Tabs (Acyclovir) .... Take As Directed 4)  Hydrocodone-Acetaminophen 5-325 Mg Tabs (Hydrocodone-Acetaminophen) .... Take 1 Tab Every 6 Hours As Needed For Pain 5)  Ativan 1 Mg Tabs (Lorazepam) .... Take 1 Tablet By Mouth Three Times A Day 6)  Wellbutrin 75 Mg Tabs (Bupropion Hcl) .... Take 1 Tablet By Mouth At Bedtime 7)  Prednisone 10 Mg Tabs (Prednisone) .... Take One Tablet Daily 8)  Metoprolol Tartrate 50 Mg Tabs (Metoprolol Tartrate) .... Take .5 Tablet By Mouth Two Times A Day 9)  Oxycodone-Acetaminophen 5-500 Mg Caps (Oxycodone-Acetaminophen) ....  Take 2 Capsule By Mouth Every 4-6 Hours As Needed For Pain 10)  Prednisone 1 Mg Tabs (Prednisone) .... Take As Directed 11)  Augmentin 875-125 Mg Tabs (Amoxicillin-Pot Clavulanate) .... Take 1 Two Times A Day 12)  Lovenox 100 Mg/ml Soln (Enoxaparin Sodium) .... Use Sub-Q Two Times A Day  Allergies (verified): 1)  ! Iron 2)  ! Steroids  Anticoagulation Management History:      The patient comes in today for her initial visit for anticoagulation therapy.  Negative risk factors for bleeding include an age less than 59 years old.  The bleeding index is 'low risk'.  Negative CHADS2 values include Age > 51 years old.  Anticoagulation responsible provider: Lovena Le MD, Carleene Overlie.  INR POC: 2.9.  Cuvette Lot#: [id number].    Anticoagulation Management Assessment/Plan:      The patient's current anticoagulation dose is Coumadin 5 mg tabs: Sunday - 0.5 tab, Monday - 0.5 tab, Tuesday - 0.5 tab, Wednesday - 0.5 tab, Thursday - 0.5 tab, Friday - 0.5 tab, Saturday - 0.5 tab.  The target INR is 2.0-3.0.  The next INR is due 09/09/2010.  Results were reviewed/authorized by Doran Stabler, PharmD.  She was notified by Doran Stabler PharmD.         Current Anticoagulation Instructions: INR 2.9  Take Lovenox shots for today. Skip today's dose of Coumadin. Starting tomorrow begin taking 1/2 tablet everyday. Will recheck INR on 09/09/2010.

## 2010-09-10 NOTE — Progress Notes (Signed)
Summary: speak to nurse  Phone Note Call from Patient Call back at Home Phone [phone]   Caller: Patient Call For: Dr [name] Reason for Call: Talk to Nurse Summary of Call: Patient states that she's in the hosp and wants to let Dr [name] know of certain meds that they want to put her on. Initial call taken by: Ronalee Red,  September 02, 2010 4:08 PM  Follow-up for Phone Call        Patient was seen in the ER over the weekend for a sore throat and  swelling in her nexk.  She is still inpatient and will be for a few more days.  She was diagnosed with Lemierre syndrome and is being started on lovenox then to coumadin for 5 months.  She is hoping to go home by Wed.  Follow-up by: Barb Merino RN, [place],  September 02, 2010 4:38 PM  Additional Follow-up for Phone Call Additional follow up Details #1::        I will call her Additional Follow-up by: Lafayette Dragon MD,  September 02, 2010 9:49 PM

## 2010-09-11 ENCOUNTER — Encounter: Payer: Self-pay | Admitting: Internal Medicine

## 2010-09-12 ENCOUNTER — Ambulatory Visit (INDEPENDENT_AMBULATORY_CARE_PROVIDER_SITE_OTHER)
Admission: RE | Admit: 2010-09-12 | Discharge: 2010-09-12 | Disposition: A | Discharge status: Home / Self Care | Payer: Self-pay | Source: Ambulatory Visit | Attending: Internal Medicine | Admitting: Internal Medicine

## 2010-09-12 DIAGNOSIS — R079 Chest pain, unspecified: Secondary | ICD-10-CM

## 2010-09-12 MED ORDER — IOHEXOL 350 MG/ML SOLN
80.0000 mL | Freq: Once | INTRAVENOUS | Status: AC | PRN
  Administered 2010-09-12: 80 mL via INTRAVENOUS

## 2010-09-13 ENCOUNTER — Inpatient Hospital Stay (HOSPITAL_COMMUNITY)
Admission: EM | Admit: 2010-09-13 | Discharge: 2010-09-16 | DRG: 386 | Disposition: A | Discharge status: Home / Self Care | Payer: Self-pay | Attending: Internal Medicine | Admitting: Internal Medicine

## 2010-09-13 ENCOUNTER — Telehealth: Payer: Self-pay | Admitting: Nurse Practitioner

## 2010-09-13 ENCOUNTER — Emergency Department (HOSPITAL_COMMUNITY): Payer: Self-pay

## 2010-09-13 DIAGNOSIS — N390 Urinary tract infection, site not specified: Secondary | ICD-10-CM | POA: Diagnosis present

## 2010-09-13 DIAGNOSIS — K625 Hemorrhage of anus and rectum: Secondary | ICD-10-CM | POA: Diagnosis present

## 2010-09-13 DIAGNOSIS — Z86718 Personal history of other venous thrombosis and embolism: Secondary | ICD-10-CM

## 2010-09-13 DIAGNOSIS — E876 Hypokalemia: Secondary | ICD-10-CM | POA: Diagnosis not present

## 2010-09-13 DIAGNOSIS — D473 Essential (hemorrhagic) thrombocythemia: Secondary | ICD-10-CM | POA: Diagnosis present

## 2010-09-13 DIAGNOSIS — K5090 Crohn's disease, unspecified, without complications: Principal | ICD-10-CM | POA: Diagnosis present

## 2010-09-13 DIAGNOSIS — D509 Iron deficiency anemia, unspecified: Secondary | ICD-10-CM | POA: Diagnosis present

## 2010-09-13 DIAGNOSIS — I10 Essential (primary) hypertension: Secondary | ICD-10-CM | POA: Diagnosis present

## 2010-09-13 DIAGNOSIS — Z7901 Long term (current) use of anticoagulants: Secondary | ICD-10-CM

## 2010-09-13 DIAGNOSIS — F341 Dysthymic disorder: Secondary | ICD-10-CM | POA: Diagnosis present

## 2010-09-13 DIAGNOSIS — Z432 Encounter for attention to ileostomy: Secondary | ICD-10-CM

## 2010-09-13 LAB — PROTIME-INR: INR: 1.89 — ABNORMAL HIGH (ref 0.00–1.49)

## 2010-09-13 LAB — CBC
MCH: 23.7 pg — ABNORMAL LOW (ref 26.0–34.0)
MCV: 78.9 fL (ref 78.0–100.0)
Platelets: 614 10*3/uL — ABNORMAL HIGH (ref 150–400)
RBC: 3.8 MIL/uL — ABNORMAL LOW (ref 3.87–5.11)
RDW: 17 % — ABNORMAL HIGH (ref 11.5–15.5)

## 2010-09-13 LAB — APTT: aPTT: 61 seconds — ABNORMAL HIGH (ref 24–37)

## 2010-09-13 LAB — URINALYSIS, ROUTINE W REFLEX MICROSCOPIC
Bilirubin Urine: NEGATIVE
Nitrite: NEGATIVE
Specific Gravity, Urine: 1.007 (ref 1.005–1.030)
Urobilinogen, UA: 0.2 mg/dL (ref 0.0–1.0)
pH: 6.5 (ref 5.0–8.0)

## 2010-09-13 LAB — DIFFERENTIAL
Eosinophils Absolute: 0.2 10*3/uL (ref 0.0–0.7)
Lymphocytes Relative: 18 % (ref 12–46)
Lymphs Abs: 1.5 10*3/uL (ref 0.7–4.0)
Neutrophils Relative %: 73 % (ref 43–77)

## 2010-09-13 LAB — OCCULT BLOOD, POC DEVICE: Fecal Occult Bld: POSITIVE

## 2010-09-13 LAB — URINE MICROSCOPIC-ADD ON

## 2010-09-13 LAB — COMPREHENSIVE METABOLIC PANEL
AST: 14 U/L (ref 0–37)
Albumin: 2.8 g/dL — ABNORMAL LOW (ref 3.5–5.2)
BUN: 3 mg/dL — ABNORMAL LOW (ref 6–23)
Calcium: 9.1 mg/dL (ref 8.4–10.5)
Creatinine, Ser: 0.95 mg/dL (ref 0.4–1.2)
GFR calc Af Amer: >60 mL/min (ref >60)
Total Protein: 7.8 g/dL (ref 6.0–8.3)

## 2010-09-13 LAB — SAMPLE TO BLOOD BANK

## 2010-09-13 LAB — LACTIC ACID, PLASMA: Lactic Acid, Venous: 1.3 mmol/L (ref 0.5–2.2)

## 2010-09-14 ENCOUNTER — Emergency Department (HOSPITAL_COMMUNITY): Payer: Self-pay

## 2010-09-14 DIAGNOSIS — K625 Hemorrhage of anus and rectum: Secondary | ICD-10-CM

## 2010-09-14 DIAGNOSIS — K5000 Crohn's disease of small intestine without complications: Secondary | ICD-10-CM

## 2010-09-14 DIAGNOSIS — R933 Abnormal findings on diagnostic imaging of other parts of digestive tract: Secondary | ICD-10-CM

## 2010-09-14 DIAGNOSIS — R109 Unspecified abdominal pain: Secondary | ICD-10-CM

## 2010-09-14 LAB — COMPREHENSIVE METABOLIC PANEL
CO2: 26 mEq/L (ref 19–32)
Calcium: 8.5 mg/dL (ref 8.4–10.5)
Creatinine, Ser: 0.83 mg/dL (ref 0.4–1.2)
GFR calc non Af Amer: >60 mL/min (ref >60)
Glucose, Bld: 77 mg/dL (ref 70–99)

## 2010-09-14 LAB — DIFFERENTIAL
Basophils Absolute: 0 10*3/uL (ref 0.0–0.1)
Basophils Relative: 0 % (ref 0–1)
Eosinophils Absolute: 0.2 10*3/uL (ref 0.0–0.7)
Monocytes Relative: 6 % (ref 3–12)
Neutrophils Relative %: 65 % (ref 43–77)

## 2010-09-14 LAB — HEMOGLOBIN AND HEMATOCRIT, BLOOD
HCT: 28.5 % — ABNORMAL LOW (ref 36.0–46.0)
Hemoglobin: 8.4 g/dL — ABNORMAL LOW (ref 12.0–15.0)

## 2010-09-14 LAB — AMYLASE: Amylase: 38 U/L (ref 0–105)

## 2010-09-14 LAB — CBC
MCH: 23.7 pg — ABNORMAL LOW (ref 26.0–34.0)
MCHC: 29.9 g/dL — ABNORMAL LOW (ref 30.0–36.0)
Platelets: 580 10*3/uL — ABNORMAL HIGH (ref 150–400)
RBC: 3.37 MIL/uL — ABNORMAL LOW (ref 3.87–5.11)

## 2010-09-14 LAB — PROTIME-INR: Prothrombin Time: 24.5 seconds — ABNORMAL HIGH (ref 11.6–15.2)

## 2010-09-14 MED ORDER — IOHEXOL 300 MG/ML  SOLN
125.0000 mL | Freq: Once | INTRAMUSCULAR | Status: AC | PRN
  Administered 2010-09-14: 125 mL via INTRAVENOUS

## 2010-09-15 LAB — BASIC METABOLIC PANEL
Chloride: 110 mEq/L (ref 96–112)
Creatinine, Ser: 0.65 mg/dL (ref 0.4–1.2)
GFR calc Af Amer: >60 mL/min (ref >60)
Potassium: 3.8 mEq/L (ref 3.5–5.1)

## 2010-09-15 LAB — IRON AND TIBC
Saturation Ratios: 9 % — ABNORMAL LOW (ref 20–55)
TIBC: 277 ug/dL (ref 250–470)
UIBC: 252 ug/dL

## 2010-09-15 LAB — CLOSTRIDIUM DIFFICILE BY PCR: Toxigenic C. Difficile by PCR: NEGATIVE

## 2010-09-15 LAB — CBC
HCT: 29.4 % — ABNORMAL LOW (ref 36.0–46.0)
Hemoglobin: 8.7 g/dL — ABNORMAL LOW (ref 12.0–15.0)
MCH: 23.6 pg — ABNORMAL LOW (ref 26.0–34.0)
MCHC: 29.6 g/dL — ABNORMAL LOW (ref 30.0–36.0)
MCV: 79.9 fL (ref 78.0–100.0)
Platelets: 650 10*3/uL — ABNORMAL HIGH (ref 150–400)
RBC: 3.68 MIL/uL — ABNORMAL LOW (ref 3.87–5.11)
RDW: 17.2 % — ABNORMAL HIGH (ref 11.5–15.5)
WBC: 4.9 10*3/uL (ref 4.0–10.5)

## 2010-09-15 LAB — VITAMIN B12: Vitamin B-12: 329 pg/mL (ref 211–911)

## 2010-09-15 LAB — SEDIMENTATION RATE: Sed Rate: 97 mm/hr — ABNORMAL HIGH (ref 0–22)

## 2010-09-15 NOTE — Consult Note (Signed)
[name], [name]             ACCOUNT NO.:  [id number]  MEDICAL RECORD NO.:  [id number]           PATIENT TYPE:  I  LOCATION:  1194                         FACILITY:  [facility]  PHYSICIAN:  Fenton Malling. Lucia Gaskins, M.D.  DATE OF BIRTH:  April 27, 1991  DATE OF CONSULTATION: 13 September 2010                                CONSULTATION   REASON FOR CONSULTATION:  Crohn's patient with vomiting, abdominal pain and blood in stool.  HISTORY OF PRESENT ILLNESS:  [name] is a 20 year old black female who sees Dr. Gwendolyn Grant as her primary medical doctor, Dr. [name] for long-standing Crohn disease.  Her history is complex with her Crohn disease, but  she never had surgery until May 03, 2011, when she underwent an ileocecectomy and diverting proximal loop ileostomy by Dr. Erroll Luna for Crohn disease involving the terminal ileum with small bowel obstruction.  She did have a reversal of her ileostomy on July 25, 2010.  She required a readmission on August 31, 2010, for a thrombosis of her left internal jugular vein, for which she has been placed on Coumadin. She was home, spoke with Dr. Nichola Sizer office because of abdominal pain of 2 days, rectal bleeding for 1 day, and some vomiting.  She was encouraged by Dr. [name] to return to the emergency room tonight.  What she describes being an abdominal pain and vomiting, she does not look  very sick at all to me.  She has had no history of stomach disease, liver disease, or pancreatic disease.  Her last hospitalization and discharge diagnoses included: 1. Left internal jugular vein thrombosis. 2. Chronic anemia. 3. History of supraventricular tachycardia. 4. History of Crohn disease, which has been long-standing since she     was age 22.  She has been on and off steroids, but has not been on     steroids for at least a couple of months. 5. History of chronic abdominal pain and nausea, which is entirely     worked out from a  diagnostic standpoint.  DISCHARGE MEDICATIONS: 1. Lovenox, which I think they tapered off. 2. She is on Coumadin 5 mg Monday and Friday, 2.5 mg the other days of     the week. 3. Oxycodone for pain. 4. Ativan for anxiety. 5. Metoprolol. 6. Nexium. 7. Promethazine. 8. Wellbutrin.  Her mother is in the room with her when I examine and talk to her.  PHYSICAL EXAMINATION:  VITAL SIGNS:  Her temperature is 98.5, blood pressure 124/81, pulse is 92. GENERAL:  She is a well-nourished if not obese black female.  She is alert and cooperative, again does not look like she is in extremis, though she says she is having chronic belly pain. LUNGS:  Clear to auscultation. HEART:  Regular rate and rhythm without murmur or rub. ABDOMEN:  Soft.  She has no guarding or rebound.  She has normal bowel sounds.  She has a healing wound in her left mid abdomen.  I really do not see much in the way of a surgical abdomen. EXTREMITIES:  She has good strength in all 4 extremities. NEUROLOGIC:  Grossly intact.  LABORATORY  DATA:  Her labs that I have show a white blood count of 8200, hemoglobin of 9, hematocrit 30, platelet count of 640,000.  Her sodium is 140, potassium 3.0, chloride of 105, CO2 of 27, glucose of 89, BUN of 3, creatinine of 0.95.  Her albumin is 2.8.   Her PT is 21.9, INR is 1.89.  Her PTT is 61.   Her urinalysis was negative.  Stool shows occult blood.  IMPRESSION: 1. Abdominal pain with vomiting, and rectal bleeding.  She has an     unimpressive physical exam, and I do not think she has a surgical issue. I     am seeing her for the first time, but feel if her symptoms do not     resolve, consider a CT scan.  She does not look sick and her wounds are     healing well. 2. Crohn disease and a history of resection.  Would involve Dr. [name]     and her group to follow trying to have a better hand on what her GI     symptoms are. 3. Open wound in left abdomen, which is clean. 4. History  of left internal jugular thrombosis, on anticoagulation for     this. 5. History of on Coumadin. 6. History of anemia. 7. History of chronic abdominal pain. 8. Hyperkalemia on her initial labs here.   Fenton Malling. Lucia Gaskins, M.D., FACS   DHN/MEDQ  D:  09/13/2010  T:  09/14/2010  Job:  [id number]  cc:   Arlyss Repress, MD Fax: [phone]  Lowella Bandy. [name], [place] [address]  Valerie A. Asa Lente, [place], [place] [address]  Joyice Faster. Cornett, M.D. [place] [address]  Electronically Signed by [name] M.D. on 09/15/2010 11:24:48 AM

## 2010-09-16 ENCOUNTER — Encounter: Payer: Self-pay | Admitting: Gastroenterology

## 2010-09-16 DIAGNOSIS — K5090 Crohn's disease, unspecified, without complications: Secondary | ICD-10-CM

## 2010-09-16 DIAGNOSIS — R109 Unspecified abdominal pain: Secondary | ICD-10-CM

## 2010-09-16 LAB — CBC
Hemoglobin: 8 g/dL — ABNORMAL LOW (ref 12.0–15.0)
RBC: 3.43 MIL/uL — ABNORMAL LOW (ref 3.87–5.11)

## 2010-09-16 LAB — BASIC METABOLIC PANEL
Calcium: 8.4 mg/dL (ref 8.4–10.5)
GFR calc Af Amer: >60 mL/min (ref >60)
GFR calc non Af Amer: >60 mL/min (ref >60)
Sodium: 140 mEq/L (ref 135–145)

## 2010-09-16 LAB — HM COLONOSCOPY

## 2010-09-19 ENCOUNTER — Encounter: Payer: Self-pay | Admitting: Internal Medicine

## 2010-09-19 ENCOUNTER — Ambulatory Visit (HOSPITAL_COMMUNITY)
Admission: RE | Admit: 2010-09-19 | Discharge: 2010-09-19 | Disposition: A | Discharge status: Home / Self Care | Payer: Self-pay | Source: Ambulatory Visit | Attending: Internal Medicine | Admitting: Internal Medicine

## 2010-09-19 ENCOUNTER — Encounter (INDEPENDENT_AMBULATORY_CARE_PROVIDER_SITE_OTHER): Payer: Self-pay

## 2010-09-19 DIAGNOSIS — R209 Unspecified disturbances of skin sensation: Secondary | ICD-10-CM | POA: Insufficient documentation

## 2010-09-19 DIAGNOSIS — Z7901 Long term (current) use of anticoagulants: Secondary | ICD-10-CM

## 2010-09-19 DIAGNOSIS — M62838 Other muscle spasm: Secondary | ICD-10-CM | POA: Insufficient documentation

## 2010-09-19 DIAGNOSIS — E236 Other disorders of pituitary gland: Secondary | ICD-10-CM | POA: Insufficient documentation

## 2010-09-19 DIAGNOSIS — R5381 Other malaise: Secondary | ICD-10-CM | POA: Insufficient documentation

## 2010-09-19 DIAGNOSIS — I749 Embolism and thrombosis of unspecified artery: Secondary | ICD-10-CM

## 2010-09-19 DIAGNOSIS — Z86718 Personal history of other venous thrombosis and embolism: Secondary | ICD-10-CM | POA: Insufficient documentation

## 2010-09-19 MED ORDER — GADOBENATE DIMEGLUMINE 529 MG/ML IV SOLN
20.0000 mL | Freq: Once | INTRAVENOUS | Status: AC | PRN
  Administered 2010-09-19: 20 mL via INTRAVENOUS

## 2010-09-19 NOTE — Medication Information (Signed)
Summary: rov/kh  Anticoagulant Therapy  Managed by: Javier Glazier, PharmD PCP: Gwendolyn Grant, MD Supervising MD: Stanford Breed MD, Aaron Edelman Indication 1: Jugular Vein Thrombosis  Lab Used: [facility] [place] Site: [place] INR POC 1.8 INR RANGE 2-3  Dietary changes: no    Health status changes: no    Bleeding/hemorrhagic complications: no    Recent/future hospitalizations: no    Any changes in medication regimen? no    Recent/future dental: no  Any missed doses?: no       Is patient compliant with meds? yes       Allergies: 1)  ! Iron 2)  ! Steroids  Anticoagulation Management History:      The patient is taking warfarin and comes in today for a routine follow up visit.  Negative risk factors for bleeding include an age less than 26 years old.  The bleeding index is 'low risk'.  Negative CHADS2 values include Age > 54 years old.  Today's INR is 1.8.  Anticoagulation responsible provider: Stanford Breed MD, Aaron Edelman.  INR POC: 1.8.  Cuvette Lot#: [id number].    Anticoagulation Management Assessment/Plan:      The patient's current anticoagulation dose is Coumadin 5 mg tabs: Sunday - 0.5 tab, Monday - 0.5 tab, Tuesday - 0.5 tab, Wednesday - 0.5 tab, Thursday - 0.5 tab, Friday - 0.5 tab, Saturday - 0.5 tab.  The target INR is 2.0-3.0.  The next INR is due 09/16/2010.  Results were reviewed/authorized by Javier Glazier, PharmD.  She was notified by Javier Glazier PharmD.         Prior Anticoagulation Instructions: INR 2.9  Take Lovenox shots for today. Skip today's dose of Coumadin. Starting tomorrow begin taking 1/2 tablet everyday. Will recheck INR on 09/09/2010.   Current Anticoagulation Instructions: INR 1.8 (goal 2-3)  Take 1 full tablet today (2/27), then take 1/2 tablet everyday except take 1 tablet on Friday.  Recheck INR on Monday.

## 2010-09-19 NOTE — Letter (Addendum)
Summary: Disability Letter  [facility]  [place], [place] [address]   Phone: [phone]  Fax: [phone]        09/11/2010  RE: [name]     [address]     [place], [place]  [address]  [place]     To Whom It May Concern:  I am writing this letter on behalf of my patient, [name], date of birth 1990/08/27. Ms. Loudin has severe Crohn's disease which was initially diagnosed in 2005 by colonoscopy. She has been under the care of myself since that time. Her disease is quite extensive and involves multiple areas including the duodenum, colon and terminal ileum.  In fact, due to the severity of her Crohn's, she has undergone several surgeries including cecostomy and diverting proximal ileostomy, and most recently closure of the ileostomy. Her disease has been complicated by an enterocutaneous fistula as well as thrombosis which has required prolonged hospitalization. We have tried her on multiple steroids and immunomodulators, all of which she has been unresponsive to. Up until her surgery, she was on an injectable biological medication called Humira which she was given every two weeks. Ms.Channell's disease will continue to require the use of Humira in the future. The medications that Ms. Johal is required to take are immunosuppresents and cause her to be much more suseptible to infection. Ms. Esperanza often experiences severe diarrhea, abdominal pain, nausea and vomiting, all of which would render her unable to be productive in a work environment.   Unfortunately, Ms. Bin will most certainly need long term diagnostic testing. There is also a possibility that she may need to be hospitalized multiple times for Crohn's exacerbations as they arise. Due to the severity of Ms.Swofford's disease and her continued symptoms as well as her continued need for testing and close follow up, I do not feel that she will be able to successfully continue working. I ask  that you please consider her case for full disability benefits. I appreciate your attention to this matter and will be happy to assist you in any way if needed. You can contact me at my office, [phone].  Sincerely,     Lowella Bandy. [name], MD   Appended Document: Disability Letter patient called to request letter be sent to [facility] @ Disability. Fax number [phone]. Letter faxed to the above number while on phone with patient.

## 2010-09-19 NOTE — Discharge Summary (Signed)
NAMEBLESS, LISENBY             ACCOUNT NO.:  [id number]  MEDICAL RECORD NO.:  [id number]           PATIENT TYPE:  I  LOCATION:  5462                         FACILITY:  [facility]  PHYSICIAN:  Jacquelynn Cree, M.D.   DATE OF BIRTH:  1990/11/20  DATE OF ADMISSION:  09/13/2010 DATE OF DISCHARGE:  09/16/2010                              DISCHARGE SUMMARY   PRIMARY CARE PHYSICIAN:  Mateo Flow A. Asa Lente, M.D.  PRIMARY GASTROENTEROLOGIST:  Lowella Bandy. [name], M.D.  PRIMARY SURGEON:  Marcello Moores A. Cornett, M.D.  DISCHARGE DIAGNOSES: 1. Crohn flare. 2. Hypokalemia. 3. Multifactorial normocytic anemia including iron deficiency. 4. Thrombocytosis. 5. History of deep venous thrombosis, on chronic anticoagulation. 6. Chronic left abdominal wound. 7. Depression. 8. Anxiety. 9. Urinary tract infection. 10.Hypertension.  DISCHARGE MEDICATIONS: 1. Iron sulfate 325 mg p.o. b.i.d. 2. Ativan 1 mg p.o. t.i.d. 3. Mercaptopurine 100 mg p.o. nightly. 4. Metoprolol tartrate 25 mg p.o. b.i.d. 5. Nexium 40 mg p.o. b.i.d. p.r.n. acid reflux. 6. Percocet 5/325 mg 1 tablet p.o. q.4 h. p.r.n. pain. 7. Peridex oral rinse 0.12% 1 tablespoon p.o. t.i.d. swish and spit. 8. Promethazine 25 mg p.o. t.i.d. p.r.n. 9. Warfarin 5 mg daily every Monday and Friday and 2.5 mg on other     days. 10.Wellbutrin 75 mg p.o. nightly.  CONSULTATIONS: 1. Gatha Mayer, MD, [facility], of Gastroenterology. 2. Fenton Malling. Lucia Gaskins, M.D., of General Surgery.  BRIEF ADMISSION HPI:  The patient is a 20 year old female with a past medical history of Crohn disease and ileocecectomy with diverting loop ileostomy status post closure in January who presented to the hospital with a chief complaint of abdominal pain and bloody stool.  Because of her past medical history of Crohn disease, she was thought to have a Crohn flare and subsequently referred to the hospitalist service for further evaluation and treatment.  For full details, please see  the dictated report done by Dr. Maudie Mercury.  PROCEDURES/DIAGNOSTIC STUDIES: 1. Acute abdominal series on September 13, 2010, showed chronic bibasilar     atelectasis versus scarring and no acute abdominal findings. 2. CT scan of the abdomen and pelvis on September 14, 2010, showed no CT     findings for recurrent pelvic abscess.  Small amount of residual     fluid and inflammation involving the small bowel mesentery with     associated scattered small lymph nodes.  Scattered small bowel     loops with mild wall thickening, which may suggest     inflammation/active Crohn disease.  No small bowel obstruction or     leaking oral contrast.  Small amount of free pelvic fluid.     Enlarging left ovarian cyst. 3. Colonoscopy on September 15, 2010, showed a normal colon and ileum.  DISCHARGE LABORATORY VALUES:  White blood cell count 5, hemoglobin 8, hematocrit 27.2, platelets 625.  Sodium was 140, potassium 3.9, chloride 109, bicarb 26, BUN less than 1, creatinine 0.77, glucose 78, calcium 8.4.  Iron was 25, total iron binding capacity 277, 9% saturation, urine iron-binding capacity 252.  Ferritin was 100.  Vitamin B12 was 329. Clostridium difficile PCR studies were negative.  ESR was  [place] BY PROBLEMS: 1. Crohn flare with rectal bleeding:  The patient was admitted and     treated symptomatically.  GI consultation as well as surgical     consultation was requested and kindly provided by the consultants     as noted above.  Because the patient had rapid resolution of her     symptoms, no surgical indication was felt to be needed.  The     patient did undergo colonoscopy with normal findings.  At this     point, she is being continued on her usual dose of mercaptopurine     and proton pump inhibitors therapy.  Her diet has been advanced and     she can follow up with her usual gastroenterologist postdischarge.     Her ileocecectomy and loop ileostomy were closed back in January     and are healing  well per the surgeons report. 2. Hypokalemia:  The patient's potassium was fully repleted. 3. Normocytic anemia/iron deficiency:  The patient's hemoglobin and     hematocrit have been stable throughout her hospital stay.  Her     anemia is likely due to acute as well as chronic GI as well as     menstrual losses.  The patient was put on iron supplementation     therapy. 4. Thrombocytosis:  Likely reactive due to her underlying Crohn     disease. 5. History of deep venous thrombosis:  The patient was put on     therapeutic dose Lovenox and her Coumadin was held while in the     hospital.  She can resume her usual dose of Coumadin on post     discharge. 6. Chronic left abdominal wound:  The patient was seen by the wound     care nurse and continues to receive calcium alginate dressing     changes every other day. 7. Depression/anxiety:  The patient was continued on her usual dose of     Wellbutrin. 8. Urinary tract infection:  Cultures not sent.  She has completed 3     days of empiric therapy with Rocephin, which should be adequate. 9. Hypertension:  The patient's blood pressure was well controlled on     metoprolol.  DISPOSITION:  The patient is medically stable and be discharged home.  CONDITION AT DISCHARGE:  Improved.  DISCHARGE DIET:  Heart healthy.  FOLLOWUP:  The patient is to follow up with Dr. Asa Lente as needed and with her gastroenterologist as needed as well.  Time spent in coordinating care for discharge and discharge instructions including face-to-face time of 25 minutes.     Jacquelynn Cree, M.D.     CR/MEDQ  D:  09/16/2010  T:  09/17/2010  Job:  [id number]  cc:   Mateo Flow A. Asa Lente, [place], [place] [address]  Lowella Bandy. [name], [place] [address]  Thomas A. Cornett, M.D. [place] [address]  Electronically Signed by Jacquelynn Cree M.D. on 09/17/2010 07:43:20 PM

## 2010-09-19 NOTE — Progress Notes (Signed)
Summary: speak to nurse  Phone Note Call from Patient Call back at Home Phone [phone]   Caller: Patient Call For: Dr [name] Reason for Call: Talk to Nurse Summary of Call: patient wants to speak directly to nurse. Initial call taken by: Ronalee Red,  September 09, 2010 12:52 PM  Follow-up for Phone Call        Patient calling to see who she is seeing tomorrow. Told her she is seeing [name], RNP Follow-up by: Leone Payor RN,  September 09, 2010 2:22 PM

## 2010-09-19 NOTE — Progress Notes (Signed)
Summary: Numbness CP?  Phone Note Call from Patient Call back at Home Phone [phone] Call back at [phone]/cell   Call For: [name] Reason for Call: Talk to Doctor Summary of Call: Pt requesting to speak with md. Forgot to tell her about muscle spasm that she been having on her (R) side. ? Spasm coming from DVT, or should she be spacing her meds out Initial call taken by: Tomma Lightning RMA,  September 06, 2010 12:10 PM  Follow-up for Phone Call        i called both home and cell #s listed - no answer so [facility] requesting return call - i am unable to advise as i do not have enough data- pain and spasm on right side of what - neck, abd, back, leg.? and space out which meds? - please gather more info and i will then address - thanks Follow-up by: [name],  September 06, 2010 1:59 PM  Additional Follow-up for Phone Call Additional follow up Details #1::        Pt states that she has been experiencing numbness of her entire right side. Pt says she had this happen in the hospital but was not told what is was or what to do. Numbness is more frequent now, 2-3 times a week. Pt also says she has CP, heaviness in her chest that she also satrted to experience in the hospital but now is causing SOB. Pt says she was advised by VAL that once neck pain stopped CP would too but her neck pain has been gone for a few days with CP continuing. Please advise. Additional Follow-up by: Crissie Sickles, [place],  September 09, 2010 2:11 PM  New Problems: CHEST PAIN (ICD-786.50) NUMBNESS (ICD-782.0)   Additional Follow-up for Phone Call Additional follow up Details #2::    MRI brain ordered for R body numbness and CT chest angio ordered for CP - r/o PE (already on anticoag for l neck IJ thrombosis); pcc will call once each test arranged -  Follow-up by: [name],  September 09, 2010 2:25 PM  Additional Follow-up for Phone Call Additional follow up Details #3:: Details for  Additional Follow-up Action Taken: Pt advised and will expect call from [facility] with appt info Additional Follow-up by: Crissie Sickles, [place],  September 09, 2010 3:26 PM  New Problems: CHEST PAIN (ICD-786.50) NUMBNESS (ICD-782.0)

## 2010-09-19 NOTE — Assessment & Plan Note (Addendum)
Summary: crohn's referred by Dr. Shirlean Mylar), hx [name] no show, co...   Vital Signs:  Patient profile:   20 year old female LMP:     08/17/2010 Height:      64 inches Weight:      217.13 pounds BMI:     37.40 Pulse (ortho):   72 / minute Pulse rhythm:   regular BP sitting:   118 / 80  (left arm) History of Present Illness:    [name] is a 20 year old black female followed by Dr. [name] for history of complicated Crohn's disease. Patient had a daily a ileocecectomy and diverting loop ileostomy in October 2011. On July 25, 2010 she was readmitted for closure of the loop ileostomy. Her hospital stay was quite prolonged secondary to postoperative fevers, some problems with supraventricular tachycardia and development of an intra-abdominal fluid collection for which she underwent percutaneous drainage. She required TNA and 14 days of IV antibiotics. Fluid culture was negative. Bebe was discharged home on 2/14/ 12 and she has been followed by surgery as an outpatient. Her abdominal wound has been slow to heal.  Following hospital discharge Loistine went back to emergency department for left neck pain. 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 Asa Lente. As far as her Crohn's disease, [name] is currently not on any therapy. She is here to discuss the next step.  Overall she looks and feels well. She has had some abdominal pain, mainly upper abdomen but feels that is just part of the healing process. Her appetite is fine. No nausea. Bowel movements are normal. No rectal bleeding.   GI Review of Systems    Reports abdominal pain.     Location of  Abdominal pain: upper abdomen.    Denies acid reflux, belching, bloating, chest pain, dysphagia with liquids, dysphagia with solids, heartburn, loss of appetite, nausea, vomiting, vomiting blood, weight loss, and  weight gain.         Denies anal fissure, black tarry stools, change in bowel habit, constipation, diarrhea, diverticulosis, fecal incontinence, heme positive stool, hemorrhoids, irritable bowel syndrome, jaundice, light color stool, liver problems, rectal bleeding, and  rectal pain.  Current Medications (verified): 1)  Nexium 40 Mg Cpdr (Esomeprazole Magnesium) .... Take 1 Tablet By Mouth Once A Day As Needed 2)  Promethazine Hcl 25 Mg Tabs (Promethazine Hcl) .... Take 1 Tablet By Mouth Every 4-6 Hour S As Needed For Nausea 3)  Ativan 1 Mg Tabs (Lorazepam) .... Take 1 Tablet By Mouth Three Times A Day 4)  Wellbutrin 75 Mg Tabs (Bupropion Hcl) .... Take 1 Tablet By Mouth At Bedtime 5)  Coumadin 5 Mg Tabs (Warfarin Sodium) .... Sunday - 0.5 Tab, Monday - 0.5 Tab, Tuesday - 0.5 Tab, Wednesday - 0.5 Tab, Thursday - 0.5 Tab, Friday - 0.5 Tab, Saturday - 0.5 Tab 6)  Oxycodone-Acetaminophen 7.5-325 Mg Tabs (Oxycodone-Acetaminophen) .... Take 1-2 By Mouth Every 4 Hours As Needed 7)  Metoprolol Tartrate 25 Mg Tabs (Metoprolol Tartrate) .... Take 1 Two Times A Day 8)  Reglan 5 Mg Tabs (Metoclopramide Hcl) .Marland Kitchen.. 1 By Mouth Four Times A Day For Nausea Before Meals  Allergies (verified): 1)  ! Iron 2)  ! 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 Cholecystectomy (2009) closure of ileostomy/loop 08/2010  Family History: Reviewed history from 09/06/2010 and no changes required. Family History of Breast Cancer: Maternal Grandmother Family History of Diabetes:  Maternal uncle & cousin No FH of Colon Cancer   Social History: Reviewed history from 09/06/2010  and no changes required. 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   Review of Systems  The patient denies allergy/sinus, anemia, anxiety-new, arthritis/joint pain, back pain, blood in urine, breast changes/lumps, change in vision, confusion, cough, coughing up blood, depression-new, fainting, fatigue, fever, headaches-new, hearing problems, heart murmur, heart rhythm changes, itching, menstrual pain, muscle pains/cramps, night sweats, nosebleeds, pregnancy symptoms, shortness of breath, skin rash, sleeping problems, sore throat, swelling of feet/legs, swollen lymph glands, thirst - excessive , urination - excessive , urination changes/pain, urine leakage, vision changes, and voice change.    Physical Exam  General:  Well developed, well nourished, no acute distress. Head:  Normocephalic and atraumatic. Eyes:  Conjunctiva pink, no icterus.  Neck:  no obvious masses  Lungs:  Clear throughout to auscultation. Heart:  Regular rate and rhythm; no murmurs, rubs,  or bruits. Abdomen:  Soft, nondistended, nontender. Her surgical wound is approx. 2cm long, maybe 0.5inch in depth at deepest point. Wound is pink, moist, nondraining.  Msk:  Symmetrical with no gross deformities. Normal posture. Extremities:  No palmar erythema, no edema.  Neurologic:  Alert and  oriented x4;  grossly normal neurologically. Skin:  Intact without significant lesions or rashes. Cervical Nodes:  No significant cervical adenopathy. Psych:  Alert and cooperative. Normal mood and affect.   Impression & Recommendations:  Problem # 1:  CROHN'S DISEASE (ICD-555.9) Assessment Improved Her loop ileostomy was closed mid January but hospitalization was prolonged secondary to fevers, intraabdominal fluid collection (?fistula), and supraventricular tachycardia.   We need to restart 14m as her Crohn's will likely  become active again.  Of course there is concern about starting 648min the setting of delayed wound healing but the wound looks clean / healthy. She will resume 10027mf 6mp27mily with labs in two weeks. Follow up with Dr. BrodOlevia Perchesa few weeks.   Problem # 2:  ACUTE VENOUS EMBO & THROMB INTRL JUGULAR VEINS (ICD-453.86) Assessment: Comment Only On Warfarin now.  Patient Instructions: 1)  Your Ativan prescription will be refilled.on both medications. 2)  Restart 6mp 65mmg 57my 3)  We made you  an appointment  to see Dr. BrodieOlevia Perches4-2012. 4)  We made  you a lab appointment for the LAB on 09-24-2010.  5)  Copy sent to : Dr. ValariSherrye Payorhe medication list was reviewed and reconciled.  All changed / newly prescribed medications were explained.  A complete medication list was provided to the patient / caregiver.

## 2010-09-19 NOTE — Progress Notes (Signed)
Summary: Speak to [facility]  Phone Note Call from Patient Call back at Home Phone [phone]   Caller: Patient Call For: [name] Reason for Call: Talk to Nurse Summary of Call: Patient is returning a call to [facility] about her medication list Initial call taken by: Martinique Gatliff,  September 13, 2010 9:23 AM  Follow-up for Phone Call        Talked to pt and advised her to just take the 6MP, 100 mg.  IF tab 50 mg take 2. She said the tabs are 50 mg.  She understood not to take the Imuran.  Follow-up by: Sharol Roussel,  September 13, 2010 10:40 AM  Additional Follow-up for Phone Call Additional follow up Details #1::        Pt. returned call and said she was having alot of rectal bleeding. [phone] Additional Follow-up by: Webb Laws,  September 13, 2010 3:52 PM    Additional Follow-up for Phone Call Additional follow up Details #2::    Call taken @ 5:01 pm. Patient again called back in audible distress. Hard to get a lot of details as to what is wrong with patient because she is crying so hard. Patient can tell me that she is having severe mid abdominal pain and rates it on a scale of 10 out of 10. Apparently also having rectal bleeding as per recent call earlier today. Patient notes nausea. I have spoken to [name], NP (she is still in office) and Nevin Bloodgood states that due to time of call and inability to see her in office, patient should go to ER for evaluation. Nevin Bloodgood states that she will talk to Nicoletta Ba, PA-C to discuss case with her. Patient verbalizes understanding that she should go to the ER. I have advised that she have someone to take her to ER right away.  Follow-up by: Madlyn Frankel CMA [name],  September 13, 2010 5:10 PM  Additional Follow-up for Phone Call Additional follow up Details #3:: Details for Additional Follow-up Action Taken: agee. Additional Follow-up by: [name] NP,  September 13, 2010 10:19 PM

## 2010-09-23 LAB — DIFFERENTIAL
Basophils Relative: 0 % (ref 0–1)
Basophils Relative: 0 % (ref 0–1)
Eosinophils Absolute: 0.1 10*3/uL (ref 0.0–0.7)
Eosinophils Relative: 1 % (ref 0–5)
Lymphs Abs: 1.3 10*3/uL (ref 0.7–4.0)
Monocytes Relative: 8 % (ref 3–12)
Monocytes Relative: 5 % (ref 3–12)
Neutro Abs: 10.5 10*3/uL — ABNORMAL HIGH (ref 1.7–7.7)
Neutrophils Relative %: 72 % (ref 43–77)
Neutrophils Relative %: 81 % — ABNORMAL HIGH (ref 43–77)

## 2010-09-23 LAB — OCCULT BLOOD, POC DEVICE: Fecal Occult Bld: NEGATIVE

## 2010-09-23 LAB — URINALYSIS, ROUTINE W REFLEX MICROSCOPIC
Bilirubin Urine: NEGATIVE
Glucose, UA: NEGATIVE mg/dL
Hgb urine dipstick: NEGATIVE
Ketones, ur: NEGATIVE mg/dL
Leukocytes, UA: NEGATIVE
Nitrite: NEGATIVE
Protein, ur: 30 mg/dL — AB
Specific Gravity, Urine: 1.035 — ABNORMAL HIGH (ref 1.005–1.030)
Urobilinogen, UA: 0.2 mg/dL (ref 0.0–1.0)
pH: 5.5 (ref 5.0–8.0)

## 2010-09-23 LAB — COMPREHENSIVE METABOLIC PANEL
ALT: 38 U/L — ABNORMAL HIGH (ref 0–35)
AST: 35 U/L (ref 0–37)
Alkaline Phosphatase: 50 U/L (ref 39–117)
CO2: 26 mEq/L (ref 19–32)
Calcium: 8.8 mg/dL (ref 8.4–10.5)
GFR calc Af Amer: >60 mL/min (ref >60)
GFR calc non Af Amer: >60 mL/min (ref >60)
Glucose, Bld: 103 mg/dL — ABNORMAL HIGH (ref 70–99)
Potassium: 4.3 mEq/L (ref 3.5–5.1)
Sodium: 141 mEq/L (ref 135–145)
Total Protein: 5.9 g/dL — ABNORMAL LOW (ref 6.0–8.3)

## 2010-09-23 LAB — URINE MICROSCOPIC-ADD ON

## 2010-09-23 LAB — BASIC METABOLIC PANEL
Calcium: 9 mg/dL (ref 8.4–10.5)
Creatinine, Ser: 0.61 mg/dL (ref 0.4–1.2)
GFR calc Af Amer: >60 mL/min (ref >60)
GFR calc non Af Amer: >60 mL/min (ref >60)
Glucose, Bld: 84 mg/dL (ref 70–99)
Sodium: 135 mEq/L (ref 135–145)

## 2010-09-23 LAB — CBC
HCT: 30.2 % — ABNORMAL LOW (ref 36.0–46.0)
Hemoglobin: 10 g/dL — ABNORMAL LOW (ref 12.0–15.0)
Hemoglobin: 9.4 g/dL — ABNORMAL LOW (ref 12.0–15.0)
MCHC: 30.4 g/dL (ref 30.0–36.0)
Platelets: 482 10*3/uL — ABNORMAL HIGH (ref 150–400)
RBC: 4.2 MIL/uL (ref 3.87–5.11)
WBC: 10.9 10*3/uL — ABNORMAL HIGH (ref 4.0–10.5)

## 2010-09-23 LAB — POCT PREGNANCY, URINE: Preg Test, Ur: NEGATIVE

## 2010-09-24 ENCOUNTER — Other Ambulatory Visit: Payer: Self-pay

## 2010-09-24 LAB — COMPREHENSIVE METABOLIC PANEL
ALT: 34 U/L (ref 0–35)
ALT: 66 U/L — ABNORMAL HIGH (ref 0–35)
AST: 22 U/L (ref 0–37)
AST: 26 U/L (ref 0–37)
Albumin: 2.7 g/dL — ABNORMAL LOW (ref 3.5–5.2)
Alkaline Phosphatase: 69 U/L (ref 39–117)
Alkaline Phosphatase: 71 U/L (ref 39–117)
BUN: 10 mg/dL (ref 6–23)
BUN: 12 mg/dL (ref 6–23)
BUN: 4 mg/dL — ABNORMAL LOW (ref 6–23)
CO2: 25 mEq/L (ref 19–32)
CO2: 30 mEq/L (ref 19–32)
Calcium: 9.1 mg/dL (ref 8.4–10.5)
Chloride: 102 mEq/L (ref 96–112)
Chloride: 103 mEq/L (ref 96–112)
Chloride: 106 mEq/L (ref 96–112)
Creatinine, Ser: 0.55 mg/dL (ref 0.4–1.2)
Creatinine, Ser: 0.72 mg/dL (ref 0.4–1.2)
GFR calc Af Amer: >60 mL/min (ref >60)
GFR calc Af Amer: >60 mL/min (ref >60)
GFR calc non Af Amer: >60 mL/min (ref >60)
GFR calc non Af Amer: >60 mL/min (ref >60)
Glucose, Bld: 103 mg/dL — ABNORMAL HIGH (ref 70–99)
Glucose, Bld: 107 mg/dL — ABNORMAL HIGH (ref 70–99)
Glucose, Bld: 81 mg/dL (ref 70–99)
Glucose, Bld: 90 mg/dL (ref 70–99)
Potassium: 3.3 mEq/L — ABNORMAL LOW (ref 3.5–5.1)
Potassium: 3.6 mEq/L (ref 3.5–5.1)
Sodium: 138 mEq/L (ref 135–145)
Sodium: 139 mEq/L (ref 135–145)
Total Bilirubin: 0.1 mg/dL — ABNORMAL LOW (ref 0.3–1.2)
Total Bilirubin: 0.2 mg/dL — ABNORMAL LOW (ref 0.3–1.2)
Total Bilirubin: 0.3 mg/dL (ref 0.3–1.2)
Total Protein: 5.8 g/dL — ABNORMAL LOW (ref 6.0–8.3)

## 2010-09-24 LAB — BASIC METABOLIC PANEL
BUN: 10 mg/dL (ref 6–23)
BUN: 12 mg/dL (ref 6–23)
BUN: 12 mg/dL (ref 6–23)
BUN: 12 mg/dL (ref 6–23)
BUN: 14 mg/dL (ref 6–23)
BUN: 9 mg/dL (ref 6–23)
CO2: 25 mEq/L (ref 19–32)
CO2: 25 mEq/L (ref 19–32)
CO2: 28 mEq/L (ref 19–32)
CO2: 29 mEq/L (ref 19–32)
CO2: 30 mEq/L (ref 19–32)
Calcium: 8.5 mg/dL (ref 8.4–10.5)
Calcium: 8.6 mg/dL (ref 8.4–10.5)
Calcium: 8.6 mg/dL (ref 8.4–10.5)
Calcium: 9.9 mg/dL (ref 8.4–10.5)
Chloride: 104 mEq/L (ref 96–112)
Chloride: 105 mEq/L (ref 96–112)
Chloride: 105 mEq/L (ref 96–112)
Creatinine, Ser: 0.51 mg/dL (ref 0.4–1.2)
Creatinine, Ser: 0.6 mg/dL (ref 0.4–1.2)
Creatinine, Ser: 0.63 mg/dL (ref 0.4–1.2)
Creatinine, Ser: 1.49 mg/dL — ABNORMAL HIGH (ref 0.4–1.2)
GFR calc Af Amer: 53 mL/min — ABNORMAL LOW (ref >60)
GFR calc Af Amer: >60 mL/min (ref >60)
GFR calc Af Amer: >60 mL/min (ref >60)
GFR calc Af Amer: >60 mL/min (ref >60)
GFR calc non Af Amer: >60 mL/min (ref >60)
Glucose, Bld: 100 mg/dL — ABNORMAL HIGH (ref 70–99)
Glucose, Bld: 90 mg/dL (ref 70–99)
Potassium: 3.4 mEq/L — ABNORMAL LOW (ref 3.5–5.1)
Potassium: 3.7 mEq/L (ref 3.5–5.1)
Potassium: 4.4 mEq/L (ref 3.5–5.1)
Sodium: 138 mEq/L (ref 135–145)

## 2010-09-24 LAB — CHOLESTEROL, TOTAL
Cholesterol: 117 mg/dL (ref 0–200)
Cholesterol: 99 mg/dL (ref 0–200)

## 2010-09-24 LAB — URINALYSIS, ROUTINE W REFLEX MICROSCOPIC
Ketones, ur: NEGATIVE mg/dL
Leukocytes, UA: NEGATIVE
Protein, ur: NEGATIVE mg/dL
Urobilinogen, UA: 0.2 mg/dL (ref 0.0–1.0)

## 2010-09-24 LAB — GLUCOSE, CAPILLARY
Glucose-Capillary: 106 mg/dL — ABNORMAL HIGH (ref 70–99)
Glucose-Capillary: 108 mg/dL — ABNORMAL HIGH (ref 70–99)
Glucose-Capillary: 109 mg/dL — ABNORMAL HIGH (ref 70–99)
Glucose-Capillary: 111 mg/dL — ABNORMAL HIGH (ref 70–99)
Glucose-Capillary: 111 mg/dL — ABNORMAL HIGH (ref 70–99)
Glucose-Capillary: 114 mg/dL — ABNORMAL HIGH (ref 70–99)
Glucose-Capillary: 115 mg/dL — ABNORMAL HIGH (ref 70–99)
Glucose-Capillary: 117 mg/dL — ABNORMAL HIGH (ref 70–99)
Glucose-Capillary: 122 mg/dL — ABNORMAL HIGH (ref 70–99)
Glucose-Capillary: 135 mg/dL — ABNORMAL HIGH (ref 70–99)
Glucose-Capillary: 138 mg/dL — ABNORMAL HIGH (ref 70–99)
Glucose-Capillary: 81 mg/dL (ref 70–99)

## 2010-09-24 LAB — CBC
HCT: 26.4 % — ABNORMAL LOW (ref 36.0–46.0)
HCT: 26.7 % — ABNORMAL LOW (ref 36.0–46.0)
HCT: 27.3 % — ABNORMAL LOW (ref 36.0–46.0)
HCT: 27.7 % — ABNORMAL LOW (ref 36.0–46.0)
Hemoglobin: 8.4 g/dL — ABNORMAL LOW (ref 12.0–15.0)
Hemoglobin: 8.8 g/dL — ABNORMAL LOW (ref 12.0–15.0)
Hemoglobin: 9 g/dL — ABNORMAL LOW (ref 12.0–15.0)
MCH: 25.1 pg — ABNORMAL LOW (ref 26.0–34.0)
MCH: 25.4 pg — ABNORMAL LOW (ref 26.0–34.0)
MCH: 25.6 pg — ABNORMAL LOW (ref 26.0–34.0)
MCH: 25.8 pg — ABNORMAL LOW (ref 26.0–34.0)
MCHC: 31.8 g/dL (ref 30.0–36.0)
MCHC: 32.2 g/dL (ref 30.0–36.0)
MCHC: 32.4 g/dL (ref 30.0–36.0)
MCHC: 32.6 g/dL (ref 30.0–36.0)
MCV: 78.5 fL (ref 78.0–100.0)
MCV: 79.3 fL (ref 78.0–100.0)
Platelets: 384 10*3/uL (ref 150–400)
RBC: 3.43 MIL/uL — ABNORMAL LOW (ref 3.87–5.11)
RBC: 3.53 MIL/uL — ABNORMAL LOW (ref 3.87–5.11)
RDW: 20 % — ABNORMAL HIGH (ref 11.5–15.5)
RDW: 20.2 % — ABNORMAL HIGH (ref 11.5–15.5)
WBC: 11.4 10*3/uL — ABNORMAL HIGH (ref 4.0–10.5)

## 2010-09-24 LAB — DIFFERENTIAL
Basophils Absolute: 0.1 10*3/uL (ref 0.0–0.1)
Eosinophils Relative: 0 % (ref 0–5)
Lymphocytes Relative: 13 % (ref 12–46)
Lymphocytes Relative: 28 % (ref 12–46)
Lymphs Abs: 2.9 10*3/uL (ref 0.7–4.0)
Monocytes Relative: 8 % (ref 3–12)
Neutro Abs: 6.4 10*3/uL (ref 1.7–7.7)
Neutrophils Relative %: 62 % (ref 43–77)
Neutrophils Relative %: 80 % — ABNORMAL HIGH (ref 43–77)

## 2010-09-24 LAB — PREALBUMIN
Prealbumin: 26.8 mg/dL (ref 18.0–45.0)
Prealbumin: 32.1 mg/dL (ref 18.0–45.0)

## 2010-09-24 LAB — PHOSPHORUS
Phosphorus: 3.3 mg/dL (ref 2.3–4.6)
Phosphorus: 4.4 mg/dL (ref 2.3–4.6)
Phosphorus: 4.7 mg/dL — ABNORMAL HIGH (ref 2.3–4.6)

## 2010-09-24 LAB — URINE MICROSCOPIC-ADD ON

## 2010-09-24 LAB — MAGNESIUM
Magnesium: 1.8 mg/dL (ref 1.5–2.5)
Magnesium: 1.9 mg/dL (ref 1.5–2.5)
Magnesium: 1.9 mg/dL (ref 1.5–2.5)
Magnesium: 2 mg/dL (ref 1.5–2.5)
Magnesium: 2.2 mg/dL (ref 1.5–2.5)

## 2010-09-24 LAB — TRIGLYCERIDES: Triglycerides: 141 mg/dL (ref <150)

## 2010-09-24 NOTE — Medication Information (Signed)
Summary: rov/ewj pt's mom rs having mri same day.mt  Anticoagulant Therapy  Managed by: Javier Glazier, PharmD PCP: Gwendolyn Grant, MD Supervising MD: Stanford Breed MD, Aaron Edelman Indication 1: Jugular Vein Thrombosis  Lab Used: [facility] Site: [place] INR POC 1.5 INR RANGE 2-3  Dietary changes: no    Health status changes: no    Bleeding/hemorrhagic complications: yes       Details: Went to hospital Friday 09/13/10 blood in stool.  Had CT scan and colonoscopy.    Recent/future hospitalizations: no    Any changes in medication regimen? no    Recent/future dental: no  Any missed doses?: yes     Details: Resumed Coumadin at discharge on Monday 09/16/10  Is patient compliant with meds? no       Allergies: 1)  ! Iron 2)  ! Steroids  Anticoagulation Management History:      The patient is taking warfarin and comes in today for a routine follow up visit.  Negative risk factors for bleeding include an age less than 74 years old.  The bleeding index is 'low risk'.  Negative CHADS2 values include Age > 25 years old.  Her last INR was 1.8.  Anticoagulation responsible provider: Stanford Breed MD, Aaron Edelman.  INR POC: 1.5.    Anticoagulation Management Assessment/Plan:      The patient's current anticoagulation dose is Coumadin 5 mg tabs: Sunday - 0.5 tab, Monday - 0.5 tab, Tuesday - 0.5 tab, Wednesday - 0.5 tab, Thursday - 0.5 tab, Friday - 0.5 tab, Saturday - 0.5 tab.  The target INR is 2.0-3.0.  The next INR is due 09/26/2010.  Results were reviewed/authorized by Javier Glazier, PharmD.  She was notified by Freddrick March RN.         Prior Anticoagulation Instructions: INR 1.8 (goal 2-3)  Take 1 full tablet today (2/27), then take 1/2 tablet everyday except take 1 tablet on Friday.  Recheck INR on Monday.  Current Anticoagulation Instructions: INR 1.5  Take 1 tablet today, then resume same dosage 1/2 tablet daily except 1 tablet on Mondays and Fridays.  Recheck in 1  week.

## 2010-09-24 NOTE — Procedures (Signed)
Summary: Colonoscopy  Patient: [name] Note: All result statuses are Final unless otherwise noted.  Tests: (1) Colonoscopy (COL)   COL Colonoscopy           [facility]     [place], [place]  [id number]          COLONOSCOPY PROCEDURE REPORT          PATIENT:  [name], [name]  MR#:  [id number]     BIRTHDATE:  21-Nov-1990, 19 yrs. old  GENDER:  female     ENDOSCOPIST:  Sandy Salaam. Deatra Ina, MD     REF. BY:  Lowella Bandy. [name], M.D.     PROCEDURE DATE:  09/16/2010     PROCEDURE:  Diagnostic Colonoscopy     ASA CLASS:  Class II     INDICATIONS:  Abdominal pain h/o Crohn's disease     MEDICATIONS:   Fentanyl 100 mcg, Versed 12 mg, Benadryl 50 mg          DESCRIPTION OF PROCEDURE:   After the risks benefits and     alternatives of the procedure were thoroughly explained, informed     consent was obtained.  Digital rectal exam was performed and     revealed no abnormalities.   The [id number]) endoscope was     introduced through the anus and advanced to the ileum, without     limitations.  The quality of the prep was excellent, using     MoviPrep.  The instrument was then slowly withdrawn as the colon     was fully examined.     <<PROCEDUREIMAGES>>          FINDINGS:  The terminal ileum appeared normal (see image2 and     image3).  There was a normal ileo-colonic anastomosis (see     image4).  A normal appearing cecum, ileocecal valve, and     appendiceal orifice were identified. The ascending, hepatic     flexure, transverse, splenic flexure, descending, sigmoid colon,     and rectum appeared unremarkable (see image5, image6, image9,     image10, image12, and image13).   Retroflexed views in the rectum     revealed no abnormalities.    The scope was then withdrawn from     the patient and the procedure completed.          COMPLICATIONS:  None     ENDOSCOPIC IMPRESSION:     1) Normal terminal ileum     2) Normal anastomosis,  ileo-col     3) Normal colon     RECOMMENDATIONS:     1) Sedation with MAC for future procedures     2) Continue current medications     REPEAT EXAM:  No          ______________________________     Sandy Salaam. Deatra Ina, MD          CC:          n.     eSIGNED:   Sandy Salaam. Shaley Leavens at 09/16/2010 11:55 AM          Matilde Sprang, [id number]  Note: An exclamation mark (!) indicates a result that was not dispersed into the flowsheet. Document Creation Date: 09/16/2010 11:56 AM _______________________________________________________________________  (1) Order result status: Final Collection or observation date-time: 09/16/2010 11:44 Requested date-time:  Receipt date-time:  Reported date-time:  Referring Physician:   Ordering Physician: Erskine Emery [phone]) Specimen Source:  Source: Tawanna Cooler Order Number: [id number] Lab site:

## 2010-09-25 LAB — DIFFERENTIAL
Basophils Absolute: 0 10*3/uL (ref 0.0–0.1)
Basophils Absolute: 0 10*3/uL (ref 0.0–0.1)
Basophils Absolute: 0 10*3/uL (ref 0.0–0.1)
Basophils Absolute: 0 10*3/uL (ref 0.0–0.1)
Basophils Absolute: 0 10*3/uL (ref 0.0–0.1)
Basophils Absolute: 0 10*3/uL (ref 0.0–0.1)
Basophils Absolute: 0.1 10*3/uL (ref 0.0–0.1)
Basophils Relative: 0 % (ref 0–1)
Basophils Relative: 0 % (ref 0–1)
Basophils Relative: 0 % (ref 0–1)
Basophils Relative: 0 % (ref 0–1)
Basophils Relative: 0 % (ref 0–1)
Eosinophils Absolute: 0 10*3/uL (ref 0.0–0.7)
Eosinophils Absolute: 0 10*3/uL (ref 0.0–0.7)
Eosinophils Absolute: 0 10*3/uL (ref 0.0–0.7)
Eosinophils Relative: 0 % (ref 0–5)
Eosinophils Relative: 0 % (ref 0–5)
Eosinophils Relative: 0 % (ref 0–5)
Eosinophils Relative: 1 % (ref 0–5)
Lymphocytes Relative: 16 % (ref 12–46)
Lymphocytes Relative: 17 % (ref 12–46)
Lymphocytes Relative: 6 % — ABNORMAL LOW (ref 12–46)
Lymphocytes Relative: 6 % — ABNORMAL LOW (ref 12–46)
Lymphocytes Relative: 6 % — ABNORMAL LOW (ref 12–46)
Lymphocytes Relative: 8 % — ABNORMAL LOW (ref 12–46)
Lymphs Abs: 2.2 10*3/uL (ref 0.7–4.0)
Lymphs Abs: 2.4 10*3/uL (ref 0.7–4.0)
Lymphs Abs: 3 10*3/uL (ref 0.7–4.0)
Monocytes Absolute: 0.5 10*3/uL (ref 0.1–1.0)
Monocytes Absolute: 0.8 10*3/uL (ref 0.1–1.0)
Monocytes Absolute: 1.5 10*3/uL — ABNORMAL HIGH (ref 0.1–1.0)
Monocytes Relative: 6 % (ref 3–12)
Neutro Abs: 10.6 10*3/uL — ABNORMAL HIGH (ref 1.7–7.7)
Neutro Abs: 11.2 10*3/uL — ABNORMAL HIGH (ref 1.7–7.7)
Neutro Abs: 15.5 10*3/uL — ABNORMAL HIGH (ref 1.7–7.7)
Neutro Abs: 16.8 10*3/uL — ABNORMAL HIGH (ref 1.7–7.7)
Neutro Abs: 20.3 10*3/uL — ABNORMAL HIGH (ref 1.7–7.7)
Neutro Abs: 21.1 10*3/uL — ABNORMAL HIGH (ref 1.7–7.7)
Neutrophils Relative %: 78 % — ABNORMAL HIGH (ref 43–77)
Neutrophils Relative %: 79 % — ABNORMAL HIGH (ref 43–77)
Neutrophils Relative %: 86 % — ABNORMAL HIGH (ref 43–77)
Neutrophils Relative %: 95 % — ABNORMAL HIGH (ref 43–77)

## 2010-09-25 LAB — CBC
HCT: 28.3 % — ABNORMAL LOW (ref 36.0–46.0)
HCT: 30.2 % — ABNORMAL LOW (ref 36.0–46.0)
HCT: 30.3 % — ABNORMAL LOW (ref 36.0–46.0)
HCT: 30.7 % — ABNORMAL LOW (ref 36.0–46.0)
HCT: 31.5 % — ABNORMAL LOW (ref 36.0–46.0)
HCT: 32.8 % — ABNORMAL LOW (ref 36.0–46.0)
HCT: 34 % — ABNORMAL LOW (ref 36.0–46.0)
HCT: 36 % (ref 36.0–46.0)
Hemoglobin: 10.4 g/dL — ABNORMAL LOW (ref 12.0–15.0)
Hemoglobin: 10.5 g/dL — ABNORMAL LOW (ref 12.0–15.0)
Hemoglobin: 10.5 g/dL — ABNORMAL LOW (ref 12.0–15.0)
Hemoglobin: 10.6 g/dL — ABNORMAL LOW (ref 12.0–15.0)
Hemoglobin: 10.7 g/dL — ABNORMAL LOW (ref 12.0–15.0)
Hemoglobin: 10.9 g/dL — ABNORMAL LOW (ref 12.0–15.0)
Hemoglobin: 8.5 g/dL — ABNORMAL LOW (ref 12.0–15.0)
Hemoglobin: 9.8 g/dL — ABNORMAL LOW (ref 12.0–15.0)
MCH: 25 pg — ABNORMAL LOW (ref 26.0–34.0)
MCH: 25.1 pg — ABNORMAL LOW (ref 26.0–34.0)
MCH: 25.1 pg — ABNORMAL LOW (ref 26.0–34.0)
MCH: 25.3 pg — ABNORMAL LOW (ref 26.0–34.0)
MCH: 25.5 pg — ABNORMAL LOW (ref 26.0–34.0)
MCH: 25.6 pg — ABNORMAL LOW (ref 26.0–34.0)
MCH: 25.6 pg — ABNORMAL LOW (ref 26.0–34.0)
MCH: 25.8 pg — ABNORMAL LOW (ref 26.0–34.0)
MCHC: 32 g/dL (ref 30.0–36.0)
MCHC: 32 g/dL (ref 30.0–36.0)
MCHC: 32.1 g/dL (ref 30.0–36.0)
MCHC: 32.2 g/dL (ref 30.0–36.0)
MCHC: 32.2 g/dL (ref 30.0–36.0)
MCHC: 32.2 g/dL (ref 30.0–36.0)
MCHC: 32.3 g/dL (ref 30.0–36.0)
MCHC: 32.4 g/dL (ref 30.0–36.0)
MCHC: 32.6 g/dL (ref 30.0–36.0)
MCHC: 32.7 g/dL (ref 30.0–36.0)
MCV: 76.7 fL — ABNORMAL LOW (ref 78.0–100.0)
MCV: 77.8 fL — ABNORMAL LOW (ref 78.0–100.0)
MCV: 78.2 fL (ref 78.0–100.0)
MCV: 78.4 fL (ref 78.0–100.0)
MCV: 78.7 fL (ref 78.0–100.0)
MCV: 78.7 fL (ref 78.0–100.0)
MCV: 79 fL (ref 78.0–100.0)
MCV: 79.1 fL (ref 78.0–100.0)
MCV: 79.3 fL (ref 78.0–100.0)
MCV: 79.6 fL (ref 78.0–100.0)
Platelets: 345 10*3/uL (ref 150–400)
Platelets: 358 10*3/uL (ref 150–400)
Platelets: 394 10*3/uL (ref 150–400)
Platelets: 403 10*3/uL — ABNORMAL HIGH (ref 150–400)
Platelets: 426 10*3/uL — ABNORMAL HIGH (ref 150–400)
Platelets: 498 10*3/uL — ABNORMAL HIGH (ref 150–400)
Platelets: 527 10*3/uL — ABNORMAL HIGH (ref 150–400)
Platelets: 529 10*3/uL — ABNORMAL HIGH (ref 150–400)
RBC: 4.11 MIL/uL (ref 3.87–5.11)
RBC: 4.14 MIL/uL (ref 3.87–5.11)
RBC: 4.25 MIL/uL (ref 3.87–5.11)
RBC: 4.26 MIL/uL (ref 3.87–5.11)
RBC: 4.3 MIL/uL (ref 3.87–5.11)
RDW: 18.1 % — ABNORMAL HIGH (ref 11.5–15.5)
RDW: 18.3 % — ABNORMAL HIGH (ref 11.5–15.5)
RDW: 18.7 % — ABNORMAL HIGH (ref 11.5–15.5)
RDW: 18.7 % — ABNORMAL HIGH (ref 11.5–15.5)
RDW: 18.8 % — ABNORMAL HIGH (ref 11.5–15.5)
RDW: 19 % — ABNORMAL HIGH (ref 11.5–15.5)
RDW: 19.6 % — ABNORMAL HIGH (ref 11.5–15.5)
RDW: 19.7 % — ABNORMAL HIGH (ref 11.5–15.5)
RDW: 19.8 % — ABNORMAL HIGH (ref 11.5–15.5)
RDW: 19.9 % — ABNORMAL HIGH (ref 11.5–15.5)
RDW: 20 % — ABNORMAL HIGH (ref 11.5–15.5)
WBC: 13.9 10*3/uL — ABNORMAL HIGH (ref 4.0–10.5)
WBC: 14.2 10*3/uL — ABNORMAL HIGH (ref 4.0–10.5)
WBC: 14.5 10*3/uL — ABNORMAL HIGH (ref 4.0–10.5)
WBC: 14.9 10*3/uL — ABNORMAL HIGH (ref 4.0–10.5)
WBC: 15.9 10*3/uL — ABNORMAL HIGH (ref 4.0–10.5)
WBC: 19.1 10*3/uL — ABNORMAL HIGH (ref 4.0–10.5)
WBC: 19.5 10*3/uL — ABNORMAL HIGH (ref 4.0–10.5)
WBC: 19.9 10*3/uL — ABNORMAL HIGH (ref 4.0–10.5)
WBC: 22.5 10*3/uL — ABNORMAL HIGH (ref 4.0–10.5)
WBC: 23.6 10*3/uL — ABNORMAL HIGH (ref 4.0–10.5)

## 2010-09-25 LAB — BASIC METABOLIC PANEL
BUN: 13 mg/dL (ref 6–23)
BUN: 15 mg/dL (ref 6–23)
BUN: 6 mg/dL (ref 6–23)
BUN: 9 mg/dL (ref 6–23)
CO2: 26 mEq/L (ref 19–32)
CO2: 28 mEq/L (ref 19–32)
CO2: 29 mEq/L (ref 19–32)
Calcium: 8.3 mg/dL — ABNORMAL LOW (ref 8.4–10.5)
Calcium: 8.5 mg/dL (ref 8.4–10.5)
Calcium: 8.7 mg/dL (ref 8.4–10.5)
Calcium: 8.8 mg/dL (ref 8.4–10.5)
Chloride: 102 mEq/L (ref 96–112)
Chloride: 104 mEq/L (ref 96–112)
Chloride: 104 mEq/L (ref 96–112)
Chloride: 104 mEq/L (ref 96–112)
Creatinine, Ser: 0.51 mg/dL (ref 0.4–1.2)
Creatinine, Ser: 0.53 mg/dL (ref 0.4–1.2)
Creatinine, Ser: 0.62 mg/dL (ref 0.4–1.2)
GFR calc Af Amer: >60 mL/min (ref >60)
GFR calc Af Amer: >60 mL/min (ref >60)
GFR calc Af Amer: >60 mL/min (ref >60)
GFR calc Af Amer: >60 mL/min (ref >60)
GFR calc non Af Amer: >60 mL/min (ref >60)
GFR calc non Af Amer: >60 mL/min (ref >60)
GFR calc non Af Amer: >60 mL/min (ref >60)
Glucose, Bld: 124 mg/dL — ABNORMAL HIGH (ref 70–99)
Glucose, Bld: 132 mg/dL — ABNORMAL HIGH (ref 70–99)
Glucose, Bld: 86 mg/dL (ref 70–99)
Potassium: 3.5 mEq/L (ref 3.5–5.1)
Potassium: 3.9 mEq/L (ref 3.5–5.1)
Potassium: 4.1 mEq/L (ref 3.5–5.1)
Potassium: 4.2 mEq/L (ref 3.5–5.1)
Sodium: 133 mEq/L — ABNORMAL LOW (ref 135–145)
Sodium: 135 mEq/L (ref 135–145)

## 2010-09-25 LAB — GLUCOSE, CAPILLARY
Glucose-Capillary: 105 mg/dL — ABNORMAL HIGH (ref 70–99)
Glucose-Capillary: 106 mg/dL — ABNORMAL HIGH (ref 70–99)
Glucose-Capillary: 110 mg/dL — ABNORMAL HIGH (ref 70–99)
Glucose-Capillary: 110 mg/dL — ABNORMAL HIGH (ref 70–99)
Glucose-Capillary: 110 mg/dL — ABNORMAL HIGH (ref 70–99)
Glucose-Capillary: 114 mg/dL — ABNORMAL HIGH (ref 70–99)
Glucose-Capillary: 115 mg/dL — ABNORMAL HIGH (ref 70–99)
Glucose-Capillary: 115 mg/dL — ABNORMAL HIGH (ref 70–99)
Glucose-Capillary: 118 mg/dL — ABNORMAL HIGH (ref 70–99)
Glucose-Capillary: 119 mg/dL — ABNORMAL HIGH (ref 70–99)
Glucose-Capillary: 120 mg/dL — ABNORMAL HIGH (ref 70–99)
Glucose-Capillary: 120 mg/dL — ABNORMAL HIGH (ref 70–99)
Glucose-Capillary: 121 mg/dL — ABNORMAL HIGH (ref 70–99)
Glucose-Capillary: 122 mg/dL — ABNORMAL HIGH (ref 70–99)
Glucose-Capillary: 123 mg/dL — ABNORMAL HIGH (ref 70–99)
Glucose-Capillary: 124 mg/dL — ABNORMAL HIGH (ref 70–99)
Glucose-Capillary: 125 mg/dL — ABNORMAL HIGH (ref 70–99)
Glucose-Capillary: 125 mg/dL — ABNORMAL HIGH (ref 70–99)
Glucose-Capillary: 126 mg/dL — ABNORMAL HIGH (ref 70–99)
Glucose-Capillary: 126 mg/dL — ABNORMAL HIGH (ref 70–99)
Glucose-Capillary: 128 mg/dL — ABNORMAL HIGH (ref 70–99)
Glucose-Capillary: 134 mg/dL — ABNORMAL HIGH (ref 70–99)
Glucose-Capillary: 134 mg/dL — ABNORMAL HIGH (ref 70–99)
Glucose-Capillary: 134 mg/dL — ABNORMAL HIGH (ref 70–99)
Glucose-Capillary: 137 mg/dL — ABNORMAL HIGH (ref 70–99)
Glucose-Capillary: 138 mg/dL — ABNORMAL HIGH (ref 70–99)
Glucose-Capillary: 167 mg/dL — ABNORMAL HIGH (ref 70–99)
Glucose-Capillary: 172 mg/dL — ABNORMAL HIGH (ref 70–99)
Glucose-Capillary: 94 mg/dL (ref 70–99)
Glucose-Capillary: 97 mg/dL (ref 70–99)

## 2010-09-25 LAB — COMPREHENSIVE METABOLIC PANEL
ALT: 131 U/L — ABNORMAL HIGH (ref 0–35)
ALT: 17 U/L (ref 0–35)
ALT: 32 U/L (ref 0–35)
ALT: 33 U/L (ref 0–35)
ALT: 46 U/L — ABNORMAL HIGH (ref 0–35)
AST: 15 U/L (ref 0–37)
AST: 18 U/L (ref 0–37)
AST: 19 U/L (ref 0–37)
AST: 21 U/L (ref 0–37)
AST: 27 U/L (ref 0–37)
AST: 38 U/L — ABNORMAL HIGH (ref 0–37)
AST: 53 U/L — ABNORMAL HIGH (ref 0–37)
Albumin: 2.2 g/dL — ABNORMAL LOW (ref 3.5–5.2)
Albumin: 2.4 g/dL — ABNORMAL LOW (ref 3.5–5.2)
Albumin: 2.5 g/dL — ABNORMAL LOW (ref 3.5–5.2)
Albumin: 2.5 g/dL — ABNORMAL LOW (ref 3.5–5.2)
Albumin: 2.6 g/dL — ABNORMAL LOW (ref 3.5–5.2)
Albumin: 2.9 g/dL — ABNORMAL LOW (ref 3.5–5.2)
Albumin: 3.1 g/dL — ABNORMAL LOW (ref 3.5–5.2)
Alkaline Phosphatase: 33 U/L — ABNORMAL LOW (ref 39–117)
Alkaline Phosphatase: 35 U/L — ABNORMAL LOW (ref 39–117)
Alkaline Phosphatase: 40 U/L (ref 39–117)
Alkaline Phosphatase: 45 U/L (ref 39–117)
Alkaline Phosphatase: 58 U/L (ref 39–117)
BUN: 13 mg/dL (ref 6–23)
BUN: 14 mg/dL (ref 6–23)
BUN: 15 mg/dL (ref 6–23)
BUN: 15 mg/dL (ref 6–23)
BUN: 18 mg/dL (ref 6–23)
BUN: 18 mg/dL (ref 6–23)
BUN: 7 mg/dL (ref 6–23)
BUN: 7 mg/dL (ref 6–23)
CO2: 26 mEq/L (ref 19–32)
CO2: 27 mEq/L (ref 19–32)
CO2: 27 mEq/L (ref 19–32)
CO2: 31 mEq/L (ref 19–32)
CO2: 32 mEq/L (ref 19–32)
Calcium: 7.9 mg/dL — ABNORMAL LOW (ref 8.4–10.5)
Calcium: 8.5 mg/dL (ref 8.4–10.5)
Calcium: 8.6 mg/dL (ref 8.4–10.5)
Calcium: 8.7 mg/dL (ref 8.4–10.5)
Calcium: 8.9 mg/dL (ref 8.4–10.5)
Chloride: 101 mEq/L (ref 96–112)
Chloride: 101 mEq/L (ref 96–112)
Chloride: 102 mEq/L (ref 96–112)
Chloride: 98 mEq/L (ref 96–112)
Creatinine, Ser: 0.48 mg/dL (ref 0.4–1.2)
Creatinine, Ser: 0.54 mg/dL (ref 0.4–1.2)
Creatinine, Ser: 0.54 mg/dL (ref 0.4–1.2)
Creatinine, Ser: 0.56 mg/dL (ref 0.4–1.2)
Creatinine, Ser: 0.57 mg/dL (ref 0.4–1.2)
Creatinine, Ser: 0.57 mg/dL (ref 0.4–1.2)
Creatinine, Ser: 0.59 mg/dL (ref 0.4–1.2)
Creatinine, Ser: 0.7 mg/dL (ref 0.4–1.2)
Creatinine, Ser: 0.89 mg/dL (ref 0.4–1.2)
GFR calc Af Amer: >60 mL/min (ref >60)
GFR calc Af Amer: >60 mL/min (ref >60)
GFR calc Af Amer: >60 mL/min (ref >60)
GFR calc Af Amer: >60 mL/min (ref >60)
GFR calc Af Amer: >60 mL/min (ref >60)
GFR calc Af Amer: >60 mL/min (ref >60)
GFR calc non Af Amer: >60 mL/min (ref >60)
GFR calc non Af Amer: >60 mL/min (ref >60)
GFR calc non Af Amer: >60 mL/min (ref >60)
GFR calc non Af Amer: >60 mL/min (ref >60)
GFR calc non Af Amer: >60 mL/min (ref >60)
Glucose, Bld: 109 mg/dL — ABNORMAL HIGH (ref 70–99)
Glucose, Bld: 113 mg/dL — ABNORMAL HIGH (ref 70–99)
Glucose, Bld: 121 mg/dL — ABNORMAL HIGH (ref 70–99)
Glucose, Bld: 138 mg/dL — ABNORMAL HIGH (ref 70–99)
Glucose, Bld: 93 mg/dL (ref 70–99)
Potassium: 4.1 mEq/L (ref 3.5–5.1)
Potassium: 4.3 mEq/L (ref 3.5–5.1)
Potassium: 4.6 mEq/L (ref 3.5–5.1)
Sodium: 132 mEq/L — ABNORMAL LOW (ref 135–145)
Sodium: 138 mEq/L (ref 135–145)
Sodium: 138 mEq/L (ref 135–145)
Sodium: 139 mEq/L (ref 135–145)
Total Bilirubin: 0.1 mg/dL — ABNORMAL LOW (ref 0.3–1.2)
Total Bilirubin: 0.3 mg/dL (ref 0.3–1.2)
Total Bilirubin: 0.3 mg/dL (ref 0.3–1.2)
Total Bilirubin: 0.5 mg/dL (ref 0.3–1.2)
Total Bilirubin: 0.6 mg/dL (ref 0.3–1.2)
Total Protein: 5.3 g/dL — ABNORMAL LOW (ref 6.0–8.3)
Total Protein: 6.1 g/dL (ref 6.0–8.3)
Total Protein: 6.1 g/dL (ref 6.0–8.3)
Total Protein: 6.4 g/dL (ref 6.0–8.3)
Total Protein: 6.6 g/dL (ref 6.0–8.3)

## 2010-09-25 LAB — CARDIAC PANEL(CRET KIN+CKTOT+MB+TROPI)
Relative Index: 0.9 (ref 0.0–2.5)
Relative Index: INVALID (ref 0.0–2.5)
Total CK: 119 U/L (ref 7–177)
Total CK: 88 U/L (ref 7–177)
Troponin I: 0.01 ng/mL (ref 0.00–0.06)

## 2010-09-25 LAB — URINALYSIS, MICROSCOPIC ONLY
Bilirubin Urine: NEGATIVE
Glucose, UA: NEGATIVE mg/dL
Hgb urine dipstick: NEGATIVE
Leukocytes, UA: NEGATIVE
Nitrite: NEGATIVE
Nitrite: NEGATIVE
Protein, ur: NEGATIVE mg/dL
Specific Gravity, Urine: 1.018 (ref 1.005–1.030)
Specific Gravity, Urine: 1.029 (ref 1.005–1.030)
Urobilinogen, UA: 0.2 mg/dL (ref 0.0–1.0)
pH: 6.5 (ref 5.0–8.0)

## 2010-09-25 LAB — MRSA CULTURE

## 2010-09-25 LAB — PREGNANCY, URINE: Preg Test, Ur: NEGATIVE

## 2010-09-25 LAB — MAGNESIUM
Magnesium: 2.1 mg/dL (ref 1.5–2.5)
Magnesium: 2.1 mg/dL (ref 1.5–2.5)
Magnesium: 2.2 mg/dL (ref 1.5–2.5)
Magnesium: 2.3 mg/dL (ref 1.5–2.5)
Magnesium: 2.5 mg/dL (ref 1.5–2.5)

## 2010-09-25 LAB — PROTIME-INR
INR: 1.06 (ref 0.00–1.49)
Prothrombin Time: 14 seconds (ref 11.6–15.2)

## 2010-09-25 LAB — CHOLESTEROL, TOTAL
Cholesterol: 133 mg/dL (ref 0–200)
Cholesterol: 151 mg/dL (ref 0–200)
Cholesterol: 171 mg/dL (ref 0–200)
Cholesterol: 95 mg/dL (ref 0–200)

## 2010-09-25 LAB — TRIGLYCERIDES
Triglycerides: 79 mg/dL (ref <150)
Triglycerides: 94 mg/dL (ref <150)

## 2010-09-25 LAB — LIPID PANEL
Cholesterol: 123 mg/dL (ref 0–200)
HDL: 47 mg/dL (ref >39)
LDL Cholesterol: 56 mg/dL (ref 0–99)
Total CHOL/HDL Ratio: 2.6 RATIO
Triglycerides: 98 mg/dL (ref <150)

## 2010-09-25 LAB — HEMOGLOBIN A1C
Mean Plasma Glucose: 126 mg/dL — ABNORMAL HIGH (ref <117)
Mean Plasma Glucose: 128 mg/dL — ABNORMAL HIGH (ref <117)

## 2010-09-25 LAB — URINE CULTURE: Special Requests: POSITIVE

## 2010-09-25 LAB — C-REACTIVE PROTEIN: CRP: 2.4 mg/dL — ABNORMAL HIGH (ref <0.6)

## 2010-09-25 LAB — PHOSPHORUS
Phosphorus: 4.3 mg/dL (ref 2.3–4.6)
Phosphorus: 4.3 mg/dL (ref 2.3–4.6)
Phosphorus: 4.5 mg/dL (ref 2.3–4.6)
Phosphorus: 4.9 mg/dL — ABNORMAL HIGH (ref 2.3–4.6)

## 2010-09-25 LAB — VITAMIN B12: Vitamin B-12: 445 pg/mL (ref 211–911)

## 2010-09-25 LAB — D-DIMER, QUANTITATIVE: D-Dimer, Quant: 11.48 ug/mL-FEU — ABNORMAL HIGH (ref 0.00–0.48)

## 2010-09-25 LAB — HIV ANTIBODY (ROUTINE TESTING W REFLEX): HIV: NONREACTIVE

## 2010-09-25 LAB — TYPE AND SCREEN: ABO/RH(D): AB POS

## 2010-09-25 LAB — PREALBUMIN
Prealbumin: 28.4 mg/dL (ref 18.0–45.0)
Prealbumin: 32 mg/dL (ref 18.0–45.0)

## 2010-09-25 LAB — APTT: aPTT: 31 seconds (ref 24–37)

## 2010-09-26 ENCOUNTER — Encounter (INDEPENDENT_AMBULATORY_CARE_PROVIDER_SITE_OTHER): Payer: Self-pay

## 2010-09-26 ENCOUNTER — Encounter: Payer: Self-pay | Admitting: Internal Medicine

## 2010-09-26 DIAGNOSIS — Z7901 Long term (current) use of anticoagulants: Secondary | ICD-10-CM

## 2010-09-26 DIAGNOSIS — I82C19 Acute embolism and thrombosis of unspecified internal jugular vein: Secondary | ICD-10-CM

## 2010-09-26 LAB — COMPREHENSIVE METABOLIC PANEL
AST: 15 U/L (ref 0–37)
Albumin: 2.9 g/dL — ABNORMAL LOW (ref 3.5–5.2)
Chloride: 104 mEq/L (ref 96–112)
Creatinine, Ser: 0.62 mg/dL (ref 0.4–1.2)
GFR calc Af Amer: >60 mL/min (ref >60)
Total Bilirubin: 0.3 mg/dL (ref 0.3–1.2)

## 2010-09-26 LAB — DIFFERENTIAL
Basophils Absolute: 0.1 10*3/uL (ref 0.0–0.1)
Eosinophils Relative: 2 % (ref 0–5)
Lymphocytes Relative: 19 % (ref 12–46)
Lymphs Abs: 1.3 10*3/uL (ref 0.7–4.0)
Lymphs Abs: 1.7 10*3/uL (ref 0.7–4.0)
Monocytes Absolute: 0.9 10*3/uL (ref 0.1–1.0)
Monocytes Absolute: 1 10*3/uL (ref 0.1–1.0)
Monocytes Relative: 11 % (ref 3–12)
Neutro Abs: 6.4 10*3/uL (ref 1.7–7.7)
Neutrophils Relative %: 73 % (ref 43–77)

## 2010-09-26 LAB — CBC
HCT: 34.6 % — ABNORMAL LOW (ref 36.0–46.0)
Hemoglobin: 11.2 g/dL — ABNORMAL LOW (ref 12.0–15.0)
MCH: 25.1 pg — ABNORMAL LOW (ref 26.0–34.0)
MCV: 77.3 fL — ABNORMAL LOW (ref 78.0–100.0)
Platelets: 458 10*3/uL — ABNORMAL HIGH (ref 150–400)
RBC: 4.47 MIL/uL (ref 3.87–5.11)
RDW: 18.1 % — ABNORMAL HIGH (ref 11.5–15.5)
WBC: 9.1 10*3/uL (ref 4.0–10.5)

## 2010-09-26 LAB — URINALYSIS, ROUTINE W REFLEX MICROSCOPIC
Glucose, UA: NEGATIVE mg/dL
Hgb urine dipstick: NEGATIVE
Nitrite: NEGATIVE
Specific Gravity, Urine: 1.011 (ref 1.005–1.030)
Specific Gravity, Urine: 1.024 (ref 1.005–1.030)
Urobilinogen, UA: 0.2 mg/dL (ref 0.0–1.0)
pH: 6 (ref 5.0–8.0)
pH: 7.5 (ref 5.0–8.0)

## 2010-09-26 LAB — BASIC METABOLIC PANEL
CO2: 25 mEq/L (ref 19–32)
Calcium: 9 mg/dL (ref 8.4–10.5)
GFR calc Af Amer: >60 mL/min (ref >60)
GFR calc non Af Amer: >60 mL/min (ref >60)
Glucose, Bld: 109 mg/dL — ABNORMAL HIGH (ref 70–99)
Potassium: 3.4 mEq/L — ABNORMAL LOW (ref 3.5–5.1)
Sodium: 138 mEq/L (ref 135–145)

## 2010-09-26 LAB — URINE CULTURE

## 2010-09-26 LAB — LIPASE, BLOOD: Lipase: 22 U/L (ref 11–59)

## 2010-09-26 LAB — HEPATIC FUNCTION PANEL
ALT: 13 U/L (ref 0–35)
Bilirubin, Direct: <0.1 mg/dL (ref 0.0–0.3)

## 2010-09-26 LAB — URINE MICROSCOPIC-ADD ON

## 2010-09-26 LAB — IRON AND TIBC: UIBC: 343 ug/dL

## 2010-09-26 LAB — POCT PREGNANCY, URINE: Preg Test, Ur: NEGATIVE

## 2010-09-26 LAB — CONVERTED CEMR LAB: POC INR: 1.3

## 2010-09-26 LAB — TSH: TSH: 1.358 u[IU]/mL (ref 0.350–4.500)

## 2010-09-27 ENCOUNTER — Encounter: Payer: Self-pay | Admitting: Internal Medicine

## 2010-09-27 DIAGNOSIS — I82C19 Acute embolism and thrombosis of unspecified internal jugular vein: Secondary | ICD-10-CM

## 2010-09-29 NOTE — Discharge Summary (Signed)
[name], [name]             ACCOUNT NO.:  [id number]  MEDICAL RECORD NO.:  [id number]           PATIENT TYPE:  I  LOCATION:  1440                         FACILITY:  [facility]  PHYSICIAN:  Vernell Leep, MD     DATE OF BIRTH:  1991-04-19  DATE OF ADMISSION:  08/31/2010 DATE OF DISCHARGE:  09/04/2010                        DISCHARGE SUMMARY - REFERRING   PRIMARY CARE PHYSICIAN:  Mateo Flow A. Asa Lente, MD  SURGEON:  Joyice Faster. Cornett, MD  DISCHARGE DIAGNOSES: 1. Left internal jugular vein thrombosis/thrombophlebitis. 2. Hypokalemia. 3. Chronic anemia. 4. History of supraventricular tachycardia. 5. History of Crohn disease with ileocecectomy and diverting loop     ileostomy and closure of loop ileostomy. 6. History of enterocutaneous fistula, resolved. 7. History of chronic abdominal pain and nausea.  DISCHARGE MEDICATIONS: 1. Augmentin 875 mg p.o. b.i.d. for an additional 4 days to complete a     week's course of antibiotics. 2. Lovenox 100 mg subcutaneously b.i.d. 3. Nystatin vaginal 100,000 units tablets, 1 tablet vaginally nightly. 4. Potassium chloride 40 mEq p.o. daily. 5. Warfarin 7.5 mg p.o. at 6 p.m. daily. 6. Oxycodone/acetaminophen, 7.5/325 mg tablet, 1 tablet p.o. q.4     hourly p.r.n. for pain. 7. Ativan 1 mg p.o. t.i.d. 8. Chlorhexidine 0.12% liquid 15 mL rinse 3 times a day. 9. Metoprolol 25 mg p.o. b.i.d. 10.Nexium 40 mg p.o. b.i.d. p.r.n. for heartburns. 11.Promethazine 25 mg p.o. t.i.d. p.r.n. for nausea. 12.Wellbutrin 75 mg p.o. nightly.  PROCEDURES AND IMAGING:  CT of the neck with contrast on February 18th - impression:  Acute left internal jugular vein thrombosis.  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  LABORATORY DATA:  INR today 1.66.  Basic metabolic panel only significant for potassium of 3.4.  BUN is 1, creatinine 0.86.  Magnesium is 2.  CBC, hemoglobin 9.6, hematocrit 31, white blood cell 8.3, platelets 498, MCV 79.2.  Blood cultures  from August 31, 2010, x1 is no growth to date.  MRSA PCR screening negative.  Hepatic panel on February 19th only remarkable for albumin of 2.8.  Urinalysis showed 11- 20 white blood cells and few bacteria.  CONSULTATIONS: 1. ENT, Leta Baptist, MD 2. Surgery, [place] Cornett, MD  DIET:  Regular diet.  ACTIVITIES:  Ad lib.  COMPLAINTS TODAY:  Decreasing pain in the left side of the neck which is down to 3/10 at this time.  The patient has no sore throat or difficulty or painful swallowing.  PHYSICAL EXAMINATION:  GENERAL:  The patient is in no obvious distress. VITAL SIGNS:  Temperature 97.5 degrees Fahrenheit, pulse 78 per minute regular, respiration 18 per minute, blood pressure 111/77 mmHg, and saturating at 96% on room air. NECK:  The patient has a cord like induration and tenderness in the left side of the base of the neck, but no erythema or fluctuation or warmth. RESPIRATORY SYSTEM:  Clear. CARDIOVASCULAR SYSTEM:  First and second heart sounds heard, regular. ABDOMEN:  Soft, nontender, nondistended.  Bowel sounds normally heard. The surgical site dressing is clean, dry, and intact. CENTRAL NERVOUS SYSTEM:  The patient is awake, alert, oriented x3 with no focal neurological deficits.  HOSPITAL COURSE:  Ms. Bhatt is a 20 year old African American female patient who was discharged from the [facility] after a prolonged stay.  At that time, she underwent closure of the loop ileostomy and percutaneous intra-abdominal fluid drainage by Interventional Radiology. She presented with pain in the left side of her neck, pain on swallowing, fever of 102 degrees Fahrenheit.  She had history of 2 PICC lines in the upper extremity.  Evaluation in the emergency room with a CT scan showed thrombosis of the mid and inferior segments of the left internal jugular vein with associated thrombophlebitis.  The [facility] hospitalists were requested to admit for further management.  1. Left  internal jugular vein thrombosis and thrombophlebitis.  The patient     was admitted to the hospital.  She was empirically started on full-     dose Lovenox and warfarin anticoagulation and IV antibiotics     including Zosyn and vancomycin.  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  Dr. Benjamine Mola kindly provided the consultation.  He     performed a flexible laryngoscopy and indicated that there was     normal pharynx and larynx with no acute tonsillitis or pharyngitis.     He recommended continuing IV antibiotics and anticoagulation and no     other acute ENT intervention at this time.  The patient has     progressively done well with improvement in her pain.  She has not     been febrile.  She should complete at least a 80-monthcourse of     anticoagulation.  I discussed with Dr. TBenjamine Molaand agreed that she     should completes a week's course of antibiotics in total.  The     patient is day 4/5 Lovenox bridging today.  She will complete the     minimum required 5 days Lovenox bridging tomorrow and then she     should continue Lovenox until her INR is greater than 2 for 24     hours before discontinuing Lovenox.  Lovenox arrangement has been     made for her and she is being provided with 10 injections.     Appointments are made for her to be followed up at the Coumadin     clinic tomorrow for INR checks and the Coumadin dosage can be     adjusted appropriately.  The patient's mother has been educated to     provide the Lovenox shot and she has demonstrated that well. 2. Hypokalemia.  The patient has received multiple doses, but     continues to have low potassium.  She will continue with potassium     supplements for another 5 days and recommend repeating her BMET in     5-7 days as an outpatient. 3. Anemia which is chronic and stable. 4. History of supraventricular tachycardia.  The patient is continued     on the metoprolol. 5. History of recent surgery, as indicated above.   The patient is to     continue wound care per the surgeons previous directions.  She     missed her appointment with the surgeons which she had for today.     She is advised to make up a new followup appointment with them. 6. Vaginal candidiasis.  The patient complains of some pruritus and     will be provided with a 2-week course of nystatin vaginal     pessaries.  DISPOSITION:  The patient is discharged home in  stable condition.  The patient was seen by Dr. Brantley Stage on February 19th and indicated that there were no other general surgical issues and was okay to discharge from his standpoint.  FOLLOWUP RECOMMENDATIONS: 1. With the [facility] on [address], [place], on     February 23rd at 9:30 a.m. with PT and INR. 2. With Dr. Gwendolyn Grant on February 24th at 11:15 a.m. 3. With Dr. Erroll Luna.  The patient is to call for an     appointment. 4. With Dr. Leta Baptist.  The patient is to call for an appointment as     needed. 5. Recommend repeating the patient's basic metabolic panel in 5-7     days' time.  Time taken in coordinating this discharge is 45 minutes.     Vernell Leep, MD     AH/MEDQ  D:  09/04/2010  T:  09/04/2010  Job:  [id number]  cc:   Mateo Flow A. Asa Lente, [place], [place] [address]  Joyice Faster. Cornett, M.D. [place] [address] [place] [address]  Leta Baptist, MD Fax: [phone]  Electronically Signed by Vernell Leep MD on 09/29/2010 10:45:38 PM

## 2010-10-01 NOTE — Medication Information (Signed)
Summary: rov/ewj  Anticoagulant Therapy  Managed by: Danella Penton, RN PCP: Gwendolyn Grant, MD Supervising MD: Rayann Heman MD, Jeneen Rinks Indication 1: Jugular Vein Thrombosis  Lab Used: [facility] [place] Site: [place] INR POC 1.3 INR RANGE 2-3  Dietary changes: no    Health status changes: no    Bleeding/hemorrhagic complications: no    Recent/future hospitalizations: no    Any changes in medication regimen? no    Recent/future dental: no  Any missed doses?: no       Is patient compliant with meds? yes       Allergies: 1)  ! Iron 2)  ! Steroids  Anticoagulation Management History:      The patient is taking warfarin and comes in today for a routine follow up visit.  Negative risk factors for bleeding include an age less than 71 years old.  The bleeding index is 'low risk'.  Negative CHADS2 values include Age > 61 years old.  Her last INR was 1.8.  Anticoagulation responsible provider: Shany Marinez MD, Jeneen Rinks.  INR POC: 1.3.  Cuvette Lot#: [id number].  Exp: 09/2011.    Anticoagulation Management Assessment/Plan:      The patient's current anticoagulation dose is Coumadin 5 mg tabs: Sunday - 0.5 tab, Monday - 0.5 tab, Tuesday - 0.5 tab, Wednesday - 0.5 tab, Thursday - 0.5 tab, Friday - 0.5 tab, Saturday - 0.5 tab.  The target INR is 2.0-3.0.  The next INR is due 10/03/2010.  Results were reviewed/authorized by Danella Penton, RN.  She was notified by Danella Penton, RN.         Prior Anticoagulation Instructions: INR 1.5  Take 1 tablet today, then resume same dosage 1/2 tablet daily except 1 tablet on Mondays and Fridays.  Recheck in 1 week.    Current Anticoagulation Instructions: INR 1.3 Take 1 tablet today.  Then, begin taking 1/2 tablet every day, except take 1 tablet on Mondays, Wednesdays, and Fridays. Recheck in 1 week.

## 2010-10-03 ENCOUNTER — Ambulatory Visit (INDEPENDENT_AMBULATORY_CARE_PROVIDER_SITE_OTHER): Payer: Self-pay | Admitting: *Deleted

## 2010-10-03 DIAGNOSIS — Z7901 Long term (current) use of anticoagulants: Secondary | ICD-10-CM

## 2010-10-03 DIAGNOSIS — I82C19 Acute embolism and thrombosis of unspecified internal jugular vein: Secondary | ICD-10-CM

## 2010-10-03 LAB — POCT INR: INR: 1.1

## 2010-10-03 NOTE — Patient Instructions (Signed)
INR 1.1  Start NEW dosing schedule of 1 tablet (5 mg) daily.  Return to clinic in 1 week .

## 2010-10-10 ENCOUNTER — Ambulatory Visit (INDEPENDENT_AMBULATORY_CARE_PROVIDER_SITE_OTHER): Payer: Self-pay | Admitting: *Deleted

## 2010-10-10 DIAGNOSIS — I82C19 Acute embolism and thrombosis of unspecified internal jugular vein: Secondary | ICD-10-CM

## 2010-10-10 DIAGNOSIS — Z7901 Long term (current) use of anticoagulants: Secondary | ICD-10-CM

## 2010-10-10 NOTE — Patient Instructions (Signed)
Start taking 1.5 tablets daily except 1 tablet on Mondays, Wednesdays, and Fridays.  Recheck in 1 week.

## 2010-10-16 ENCOUNTER — Ambulatory Visit (INDEPENDENT_AMBULATORY_CARE_PROVIDER_SITE_OTHER): Payer: Self-pay | Admitting: Internal Medicine

## 2010-10-16 ENCOUNTER — Telehealth: Payer: Self-pay | Admitting: Internal Medicine

## 2010-10-16 ENCOUNTER — Other Ambulatory Visit (INDEPENDENT_AMBULATORY_CARE_PROVIDER_SITE_OTHER): Payer: Self-pay

## 2010-10-16 ENCOUNTER — Encounter: Payer: Self-pay | Admitting: Internal Medicine

## 2010-10-16 DIAGNOSIS — T8189XA Other complications of procedures, not elsewhere classified, initial encounter: Secondary | ICD-10-CM

## 2010-10-16 DIAGNOSIS — D509 Iron deficiency anemia, unspecified: Secondary | ICD-10-CM

## 2010-10-16 DIAGNOSIS — K5090 Crohn's disease, unspecified, without complications: Secondary | ICD-10-CM

## 2010-10-16 LAB — COMPREHENSIVE METABOLIC PANEL
ALT: 25 U/L (ref 0–35)
AST: 21 U/L (ref 0–37)
Albumin: 3.5 g/dL (ref 3.5–5.2)
Calcium: 9.4 mg/dL (ref 8.4–10.5)
Chloride: 104 mEq/L (ref 96–112)
Creatinine, Ser: 0.5 mg/dL (ref 0.4–1.2)
Potassium: 4.2 mEq/L (ref 3.5–5.1)

## 2010-10-16 LAB — CBC WITH DIFFERENTIAL/PLATELET
Basophils Absolute: 0 10*3/uL (ref 0.0–0.1)
Eosinophils Absolute: 0.2 10*3/uL (ref 0.0–0.7)
Lymphocytes Relative: 19.5 % (ref 12.0–46.0)
MCHC: 32.1 g/dL (ref 30.0–36.0)
Neutrophils Relative %: 73.3 % (ref 43.0–77.0)
RDW: 22.7 % — ABNORMAL HIGH (ref 11.5–14.6)

## 2010-10-16 MED ORDER — ESOMEPRAZOLE MAGNESIUM 40 MG PO CPDR: 40.0000 mg | DELAYED_RELEASE_CAPSULE | Freq: Every day | ORAL | Status: DC | PRN

## 2010-10-16 MED ORDER — MERCAPTOPURINE 50 MG PO TABS: ORAL_TABLET | ORAL | Status: DC

## 2010-10-16 NOTE — Patient Instructions (Addendum)
Your physician has requested that you go to the basement for the following lab work before leaving today: CBC, CMET We have given you samples of Nexium to take 1 capsule daily. We have sent refills of 49m to your pharmacy. Contact [facility] patient assistance. They should give paperwork to be filled out and we will go from there.

## 2010-10-16 NOTE — Progress Notes (Signed)
[name] February 04, 1991 MRN [id number]    History of Present Illness:  This is a 20 year old [place] American female with Crohn's disease since the age 1. She is status post ileo- cecectomy with diverting  loop ileostomy. She is status post closure of the ileostomy in January 2012 by Dr Malachi Paradise. She is doing well being back on 6-MP 100 mg daily. She lost her insurance from  [employer] were she was a full time student , so she had to discontinue  Humira  40 mg every 2 weeks. Her last dose of Humira was in October 2011. He is having diarrheal stools after each meal which causes  rectal irritation. There is no rectal drainage or bleeding. There has been no fever. She has crampy abdominal pain.   Past Medical History  Diagnosis Date  . Crohn's disease   . Depression   . Anxiety   . Hypertension   . Anemia     iron def  . SVT (supraventricular tachycardia)   . Candida esophagitis   . Recurrent UTI   . Ovarian cyst, left    Past Surgical History  Procedure Date  . Cholecystectomy 2009  . Closure of ileostomy/loop 08/2010  . Colectomy with diverting loop ileostomy   . Appendectomy     reports that she has never smoked. She has never used smokeless tobacco. She reports that she does not drink alcohol or use illicit drugs. family history includes Breast cancer in her maternal grandmother and Diabetes in her cousin and maternal uncle.  There is no history of Colon cancer. Allergies  Allergen Reactions  . Iron     REACTION: IV iron dextran  . Iron Dextran         Review of Systems: Negative for chest pain dysphagia, positive for abdominal pain diarrhea and rectal irritation  The remainder of the 10  point ROS is negative except as outlined in H&P   Physical Exam: General appearance  Well developed, in no distress. Eyes- non icteric. HEENT nontraumatic, normocephalic. Mouth no lesions, tongue papillated, no cheilosis. Neck supple without adenopathy, thyroid not enlarged, no carotid  bruits, no JVD. Lungs Clear to auscultation bilaterally. Cor normal S1 normal S2, regular rhythm , no murmur,  quiet precordium. Abdomen soft tender in right lower quadrant. Normoactive bowel sounds. No distention. Well healed surgical scars. Liver edge at costal margin. Rectal: Normal perianal area. Normal rectal sphincter tone. Mild tenderness of the anal canal. Stool is Hemoccult negative. Extremities no pedal edema. Skin no lesions. Neurological alert and oriented x 3. Psychological normal mood and affect.  Assessment and Plan:  Problems #1 Crohn's disease of the terminal ileum, colon and duodenum. Patient is status post terminal ileal resection,  diverting ileostomy and takedown of the ileostomy. She is doing well but she is at high risk for recurrent disease. For that reason, she needs to go back on Humira 40 mg every 2 weeks. She  still has several doses of Humira left at home and I asked her to start it without a loading dose. Her duodenal Crohn's is doing well. She takes Nexium only on an as needed basis. I gave her samples of Nexium as well as samples of Analpram cream. We will apply for financial assistance for Humira. We will do some lab work today and see her in 8 weeks.   Problem #2 severe iron deficiency anemia. We will recheck a CBC today. She is to continue iron supplements. Samples of Integra have been given. She is allergic to  iron dextran infusion.  Problem #3 depression. She is on Wellbutrin and is doing well. She is hoping to go back to school full time in the fall of 2012. 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 She is on Coumadin followed at Coumadin clinic; next prothrombin time tomorrow   10/16/2010 [name]

## 2010-10-16 NOTE — Telephone Encounter (Signed)
Improved H/H discussed with the pt, she will continue her Iron supplement, also albumin is up to 3.5.

## 2010-10-17 ENCOUNTER — Ambulatory Visit (INDEPENDENT_AMBULATORY_CARE_PROVIDER_SITE_OTHER): Payer: Self-pay | Admitting: *Deleted

## 2010-10-17 DIAGNOSIS — I82C19 Acute embolism and thrombosis of unspecified internal jugular vein: Secondary | ICD-10-CM

## 2010-10-17 DIAGNOSIS — Z7901 Long term (current) use of anticoagulants: Secondary | ICD-10-CM

## 2010-10-17 NOTE — Patient Instructions (Signed)
Take 2 tablets today then start taking 1.5 tablets daily. Recheck in 1 week.

## 2010-10-24 ENCOUNTER — Encounter: Payer: Self-pay | Admitting: Internal Medicine

## 2010-10-24 ENCOUNTER — Ambulatory Visit (INDEPENDENT_AMBULATORY_CARE_PROVIDER_SITE_OTHER): Payer: Self-pay | Admitting: *Deleted

## 2010-10-24 DIAGNOSIS — I82C19 Acute embolism and thrombosis of unspecified internal jugular vein: Secondary | ICD-10-CM

## 2010-10-24 NOTE — H&P (Signed)
NAMEABIHA, LUKEHART             ACCOUNT NO.:  [id number]  MEDICAL RECORD NO.:  [id number]           PATIENT TYPE:  E  LOCATION:  WLED                         FACILITY:  [facility]  PHYSICIAN:  Arlyss Repress, MD        DATE OF BIRTH:  1991/07/04  DATE OF ADMISSION:  09/13/2010 DATE OF DISCHARGE:                             HISTORY & PHYSICAL   CHIEF COMPLAINT:  Abdominal pain.  HISTORY OF PRESENT ILLNESS:  A 19 year old female with a history of Crohn's apparently status post ileostomy, status post reversal complicated by neck DVT, complains of bright red blood per rectum beginning 2 days ago.  She then experienced diarrhea which she describes as loose stool and then sharp left upper quadrant pain starting tonight. This is what prompted her to come to the emergency room.  Pain is constant.  She has also noted some nausea and vomiting but no bloody emesis.  The patient denies any fever, chills, cough.  The patient had a CTA of the chest yesterday negative for acute PE or thoracic aortic dissection but did show linear bibasilar atelectasis.  An acute abdominal series today showed chronic bibasilar atelectasis but no acute abdominal findings.  The patient was rectalized and stool was heme positive.  Urinalysis showed evidence of UTI.  PTT was prolonged, INR 1.89.  Potassium was low.  Liver function was within normal limits and white count was normal.  She was mildly anemic with hemoglobin of 9.0. The patient will be admitted for workup of abdominal pain, diarrhea, bright red blood per rectum, anemia, UTI.  PAST MEDICAL HISTORY: 1. Crohn disease. 2. Small-bowel obstruction. 3. SVT. 4. Chronic abdominal pain. 5. GERD. 6. Depression/anxiety. 7. History of recurrent UTI. 8. Iron deficiency anemia. 9. History of candidal esophagitis. 10.Enterocutaneous fistula, resolved.  PAST SURGICAL HISTORY: 1. Ileal colectomy with diverting loop ileostomy. 2. Cholecystectomy. 3.  Appendectomy.  SOCIAL HISTORY:  The patient does not smoke or drink.  She lives at home.  She is a Ship broker.  She is single, no children.  FAMILY HISTORY:  Positive for breast cancer in maternal grandmother, diabetes in maternal uncle and cousin and a family history of colon cancer.  She has 1 sibling.  CODE STATUS:  Full code.  ALLERGIES:  DEXAMETHASONE.  MEDICATIONS: 1. Nexium 40 mg p.o. daily. 2. Promethazine 25 mg 1 p.o. q. 4-6 hours p.r.n. nausea. 3. Ativan 1 mg p.o. t.i.d. 4. Wellbutrin 75 mg p.o. q.h.s. 5. Coumadin 5 mg, 0.5 mg on Monday, Tuesday, Wednesday, Thursday,     Friday, Saturday. 6. Percocet 7.5/325 one to two p.o. q.4 hours p.r.n. 7. Metoprolol tartrate 25 mg p.o. b.i.d. 8. Reglan 5 mg p.o. q.i.d. p.r.n. nausea.  REVIEW OF SYSTEMS:  Negative for all 10 organ systems except for pertinent positives stated above.  PHYSICAL EXAMINATION:  VITAL SIGNS:  Temperature 98.5, pulse 111, blood pressure 137/90, pulse ox 100% on room air. HEENT:  Anicteric. NECK:  No JVD, no bruit, no thyromegaly, no adenopathy. HEART:  Regular rate and rhythm.  S1, S2.  No murmurs, gallops or rubs. LUNGS:  Clear to auscultation bilaterally. ABDOMEN:  Soft, nontender,  nondistended.  Positive bowel sounds. EXTREMITIES:  No cyanosis, clubbing or edema. SKIN:  No rashes.  A 1-cm abdominal wound which is healing. LYMPH NODES:  No adenopathy.  LABORATORY DATA:  Stool is Hemoccult positive.  WBC 8.2, hemoglobin 9.0, platelet count 614.  Sodium 140, potassium 3.0 (low), BUN 3, creatinine 0.95, AST 14, ALT 14, INR 1.89, PTT 61.  Urinalysis, WBCs 11-20.  Abdominal x-ray, chronic bibasilar atelectasis versus scarring, no acute abdominal findings.  ASSESSMENT AND PLAN: 1. Abdominal pain:  We will consult with Surgery regarding this.  We     will add amylase, lipase, lactic acid to her lab work.  We will     consider further imaging with a CT scan of the abdomen and pelvis. 2. Bright red blood  per rectum:  We will involve GI in the morning and     ask them whether she needs to have a scope.  For now, we will keep     her n.p.o. 3. Diarrhea:  We will check stool studies for fecal leukocytes,     culture, C diff. 4. Nausea, vomiting.  Treat with Phenergan, Zofran symptomatically.     Also, treat with Protonix for GI prophylaxis. 5. Hypokalemia:  Replete with potassium. 6. Anemia:  Check iron studies, S17, folic acid, ESR.  Consider     SPEP/UPEP in the future.  We will repeat CBC     in the a.m..  If bright red blood per rectum continues, then we     will consider reversal of anticoagulation.  For now, we will hold     Coumadin and use Lovenox. 7. Deep venous thrombosis prophylaxis, Lovenox.     Arlyss Repress, MD     JYK/MEDQ  D:  09/13/2010  T:  09/13/2010  Job:  [id number]  cc:   Mateo Flow A. Asa Lente, [place], [place] [address]  Lowella Bandy. [name], [place] [address]  Thomas A. Cornett, M.D. [place] [address]  Electronically Signed by Jani Gravel MD on 10/24/2010 12:57:33 AM

## 2010-10-26 ENCOUNTER — Other Ambulatory Visit: Payer: Self-pay | Admitting: Internal Medicine

## 2010-10-31 ENCOUNTER — Ambulatory Visit (INDEPENDENT_AMBULATORY_CARE_PROVIDER_SITE_OTHER): Payer: Self-pay | Admitting: *Deleted

## 2010-10-31 DIAGNOSIS — I82C19 Acute embolism and thrombosis of unspecified internal jugular vein: Secondary | ICD-10-CM

## 2010-10-31 LAB — POCT INR: INR: 1.3

## 2010-10-31 MED ORDER — WARFARIN SODIUM 5 MG PO TABS: 5.0000 mg | ORAL_TABLET | ORAL | Status: DC

## 2010-11-01 ENCOUNTER — Telehealth: Payer: Self-pay | Admitting: *Deleted

## 2010-11-01 NOTE — Telephone Encounter (Signed)
We have gotten paperwork from [employer] Patient Assistance for patient's Humira. Dr [name] has filled out her portion and this has been faxed back to [employer] @ Fax [phone]. Patient has been advised of this and will come pick up the paperwork so she can fill out her part as well.

## 2010-11-07 ENCOUNTER — Ambulatory Visit (INDEPENDENT_AMBULATORY_CARE_PROVIDER_SITE_OTHER): Payer: Self-pay | Admitting: *Deleted

## 2010-11-07 DIAGNOSIS — I82C19 Acute embolism and thrombosis of unspecified internal jugular vein: Secondary | ICD-10-CM

## 2010-11-12 ENCOUNTER — Telehealth: Payer: Self-pay | Admitting: Internal Medicine

## 2010-11-12 NOTE — Telephone Encounter (Signed)
Question answered.

## 2010-11-14 ENCOUNTER — Ambulatory Visit (INDEPENDENT_AMBULATORY_CARE_PROVIDER_SITE_OTHER): Payer: Self-pay | Admitting: *Deleted

## 2010-11-14 DIAGNOSIS — I82C19 Acute embolism and thrombosis of unspecified internal jugular vein: Secondary | ICD-10-CM

## 2010-11-14 LAB — POCT INR: INR: 1.7

## 2010-11-18 ENCOUNTER — Encounter: Payer: Self-pay | Admitting: Internal Medicine

## 2010-11-19 ENCOUNTER — Encounter: Payer: Self-pay | Admitting: Internal Medicine

## 2010-11-19 ENCOUNTER — Ambulatory Visit (INDEPENDENT_AMBULATORY_CARE_PROVIDER_SITE_OTHER): Payer: Self-pay | Admitting: Internal Medicine

## 2010-11-19 VITALS — BP 112/72 | HR 71 | Temp 98.1°F | Ht 62.0 in | Wt 218.2 lb

## 2010-11-19 DIAGNOSIS — H6590 Unspecified nonsuppurative otitis media, unspecified ear: Secondary | ICD-10-CM

## 2010-11-19 DIAGNOSIS — I82C19 Acute embolism and thrombosis of unspecified internal jugular vein: Secondary | ICD-10-CM

## 2010-11-19 MED ORDER — FLUTICASONE PROPIONATE 50 MCG/ACT NA SUSP: 1.0000 | Freq: Every day | NASAL | Status: DC

## 2010-11-19 MED ORDER — PROMETHAZINE-CODEINE 6.25-10 MG/5ML PO SYRP: 5.0000 mL | ORAL_SOLUTION | ORAL | Status: AC | PRN

## 2010-11-19 MED ORDER — AZITHROMYCIN 250 MG PO TABS: 250.0000 mg | ORAL_TABLET | Freq: Every day | ORAL | Status: AC

## 2010-11-19 NOTE — Patient Instructions (Signed)
It was good to see you today. I will look into how long you need to remain on coumadin and let you as well as the Coumadin clinic know this information Zpak antibiotics for ear infection and flonase nasal spray for sinus and inflammation - also codeine cough syrup as needed - Your prescription(s) have been submitted to your pharmacy. Please take as directed and contact our office if you believe you are having problem(s) with the medication(s). Please keep scheduled followup as planned, call sooner if problems.

## 2010-11-20 ENCOUNTER — Telehealth: Payer: Self-pay | Admitting: Internal Medicine

## 2010-11-20 ENCOUNTER — Encounter: Payer: Self-pay | Admitting: Internal Medicine

## 2010-11-20 NOTE — Progress Notes (Signed)
Subjective:    Patient ID: [name], female    DOB: 16-Jul-1990, 20 y.o.   MRN: [id number]  HPI complains of R ear pain Onset 5 days ago precipitated by URI and allergy/sinus symptoms  Denies drainage - occ "popping" sound with intermittent decreased hearing No fever but aches and malaise Denies water exposure  ?how long to be on coumadin - started 08/27/10 for LIJ dvt following surg  Past Medical History  Diagnosis Date  . 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 Depression   . Anxiety   . SVT (supraventricular tachycardia)   . Candida esophagitis   . Ovarian cyst, left   . Anemia     iron def  . DVT (deep venous thrombosis) 08/2010    Left IJ (postop) anticoag x 3 mo     Review of Systems  Constitutional: Positive for fatigue.  HENT: Positive for congestion, sneezing and postnasal drip. Negative for nosebleeds and tinnitus.   Respiratory: Positive for cough. Negative for shortness of breath.   Neurological: Negative for headaches.       Objective:   Physical Exam BP 112/72  Pulse 71  Temp(Src) 98.1 F (36.7 C) (Oral)  Ht 5' 2"  (1.575 m)  Wt 218 lb 3.2 oz (98.975 kg)  BMI 39.91 kg/m2  SpO2 98% Physical Exam  Constitutional: She is overweight, oriented to person, place, and time. She appears well-developed and well-nourished. No distress.  HENT: Head: Normocephalic and atraumatic. Ears without edema or swelling external and canals clear B, R TM with serous effusion and mild erythema, L TM clear Nose: Nose normal.  Mouth/Throat: Oropharynx is clear and moist. No oropharyngeal exudate.  Eyes: Conjunctivae and EOM are normal. Pupils are equal, round, and reactive to light. No scleral icterus.  Neck: Normal range of motion. Neck supple. No JVD present. No thyromegaly present.  Cardiovascular: Normal rate, regular rhythm and normal heart sounds.  No murmur heard. Pulmonary/Chest: Effort normal and breath sounds normal.  Few rhonchi bilaterally with cough. No respiratory distress. She has no wheezes.  Psychiatric: She has a normal mood and affect. Very pleasant. Her behavior is normal. Judgment and thought content normal.   Lab Results  Component Value Date   WBC 4.7 10/16/2010   HGB 10.1* 10/16/2010   HCT 31.5* 10/16/2010   PLT 610.0* 10/16/2010   CHOL  Value: 124        ATP III CLASSIFICATION:  <200     mg/dL   Desirable  200-239  mg/dL   Borderline High  >=240    mg/dL   High        08/19/2010   TRIG 111 08/19/2010   HDL 47 05/07/2010   ALT 25 10/16/2010   AST 21 10/16/2010   NA 141 10/16/2010   K 4.2 10/16/2010   CL 104 10/16/2010   CREATININE 0.5 10/16/2010   BUN 5* 10/16/2010   CO2 28 10/16/2010   TSH 1.358 04/12/2010   INR 1.7 11/14/2010   HGBA1C  Value: 6.1 (NOTE)                                                                       According to the ADA Clinical  Practice Recommendations for 2011, when HbA1c is used as a screening test:   >=6.5%   Diagnostic of Diabetes Mellitus           (if abnormal result  is confirmed)  5.7-6.4%   Increased risk of developing Diabetes Mellitus  References:Diagnosis and Classification of Diabetes Mellitus,Diabetes WXGK,8873,73(CAFQE 1):S62-S69 and Standards of Medical Care in         Diabetes - 2011,Diabetes HAZC,6582,60  (Suppl 1):S11-S61.* 05/07/2010          Assessment & Plan:  R serous OM - tx Zpack and antitussive - also nasal steroid for allergy component with inflammation - erx done L IJ DVT 08/2010 - 3 mo anticoag anticipated after review of prior records - per [facility] mgmt as ongoing

## 2010-11-20 NOTE — Telephone Encounter (Signed)
Notified pt with md response & sent info to coumadin Clinic...11/20/10@2 :36pm/LMB

## 2010-11-20 NOTE — Telephone Encounter (Signed)
Please cal pt - after review of her hosp records and medical literature for anticoag needs, we will stop her coumadin after 3 months therapy - this would be at end of May 2012 - let pt know same and i will send a flag to [employer] and the [facility] to let them know same - thanks

## 2010-11-21 ENCOUNTER — Ambulatory Visit (INDEPENDENT_AMBULATORY_CARE_PROVIDER_SITE_OTHER): Payer: Self-pay | Admitting: *Deleted

## 2010-11-21 DIAGNOSIS — I82C19 Acute embolism and thrombosis of unspecified internal jugular vein: Secondary | ICD-10-CM

## 2010-11-26 NOTE — Discharge Summary (Signed)
[name], [name]             ACCOUNT NO.:  [id number]   MEDICAL RECORD NO.:  [id number]          PATIENT TYPE:  INP   LOCATION:  5974                         FACILITY:  [facility]   PHYSICIAN:  Lowella Bandy. [name], MD     DATE OF BIRTH:  05-07-1991   DATE OF ADMISSION:  02/09/2008  DATE OF DISCHARGE:  02/11/2008                               DISCHARGE SUMMARY   DISCHARGE DIAGNOSES:  1. Symptomatic cholelithiasis.  2. History of Crohn's disease.   DISPOSITION:  Home in stable condition.   DISCHARGE MEDICATIONS:  Patient was asked to continue all of her home  medications, including:  1. Asacol 400 mg three tablets p.o. t.i.d.  2. Nexium 40 mg b.i.d.  3. Phenergan p.r.n.  4. Reglan 10 mg h.s.  5. Symax p.r.n.  6. Darvocet N 100 p.r.n.  7. Humira injections.  8. Daily iron supplements.  9. Citrucel.   CONSULTATIONS:  General surgery was consulted for cholecystectomy.   PROCEDURES:  Laparoscopic cholecystectomy with intraoperative  cholangiogram.   HOSPITAL COURSE:  Ms. Tvedt was admitted February 09, 2008, with  epigastric and left upper quadrant abdominal pain, associated with  nausea and vomiting.  An ultrasound on admission showed cholelithiasis,  choledocholithiasis, and extrahepatic biliary ductal dilatation up to 7  mm.  The patient was admitted for IV fluids, IV antibiotics and ERCP  with probable stone extraction.  Surgical consult was placed for  possible cholecystectomy.  During the ERCP, Ms. Julson was found to  have a large amount of retained food in her stomach.  She was also found  to have a mild duodenal stricture, proximal to the papilla.  The  procedure was terminated for possible aspiration of gastric contents.  Chest x-ray followed the ERCP and showed the lungs to be clear, however.  While the patient had mild transaminitis on admission, her AST and ALT  rapidly normalized.  Surgery saw the patient and decided to proceed with  the laparoscopic cholecystectomy,  instead of waiting a re-attempt at  ERCP.  The IOC was negative.  Patient had an  uneventful postoperative course.  She was discharged home on her normal  medications on February 11, 2008.  The patient was asked to follow up with  Dr. [name], her primary gastroenterologist.  On the day of  discharge, the patient's LFTs revealed a total bilirubin of 0.4,  alkaline phosphatase of 48, AST 31, ALT 36.      [name], NP      Lowella Bandy. [name], MD  Electronically Signed    PG/MEDQ  D:  03/22/2008  T:  03/22/2008  Job:  [id number]

## 2010-11-26 NOTE — Op Note (Signed)
[name], [name]             ACCOUNT NO.:  [id number]   MEDICAL RECORD NO.:  [id number]          PATIENT TYPE:  INP   LOCATION:  67                         [facility]ACILITY:  [facility]   PHYSICIAN:  Marland Kitchen T. Hoxworth, M.D.DATE OF BIRTH:  1991-03-29   DATE OF PROCEDURE:  02/10/2008  DATE OF DISCHARGE:                               OPERATIVE REPORT   PREOPERATIVE DIAGNOSIS:  Cholelithiasis and cholecystitis.   POSTOPERATIVE DIAGNOSIS:  Cholelithiasis and cholecystitis.   SURGICAL PROCEDURES:  Laparoscopic cholecystectomy with intraoperative  cholangiogram.   SURGEON:  Dr. Excell Seltzer.   ASSISTANT:  Dr. Jeanella Anton.   ANESTHESIA:  General.   BRIEF HISTORY:  Ms. Brame is a 20 year old female with a history of  Crohn's disease of the terminal ileum and duodenum but recent endoscopy  showing no active disease.  She presents with episodic epigastric and  right upper quadrant abdominal pain and has had a gallbladder ultrasound  showing cholelithiasis with a stone in the neck of the gallbladder.  She  has also had mildly elevated LFTs which have returned toward normal.  Ultrasound questioned the possibility of a distal common bile duct  stone.  She is felt to have symptomatic cholelithiasis, and we have  recommended proceeding with laparoscopic cholecystectomy with  intraoperative cholangiogram.  The nature of the procedure, indications,  risks of bleeding, infection, bile leak, bile duct injury and possible  need for further procedures were discussed understood with the patient  and her mother.  She is now brought to operating room for this  procedure.   DESCRIPTION OF OPERATION:  The patient was brought to the operating  room, placed in supine position on the operating table and general  orotracheal anesthesia was induced.  The abdomen was widely sterilely  prepped and draped.  She was on preoperative antibiotics.  PAS were in  place.  Correct patient and procedure were  verified.  Local anesthesia  was used infiltrate the trocar sites prior to the incision.  A 1-cm  incision was made at the umbilicus and dissection carried down to the  midline fascia.  This was sharply incised for 1 cm and the peritoneum  entered under direct vision.  Through mattress sutures of 0 Vicryl, the  Hasson trocar was placed and pneumoperitoneum established.  Under direct  vision, an 11-mm trocar was placed in subxiphoid area and two 5-mm  trocars on the right subcostal margin.  The gallbladder was visualized  and appeared somewhat chronically inflamed but not severely acutely  inflamed.  The fundus grasped and elevated up over the liver and  infundibulum exposed.  There were quite a few fibrofatty adhesions and  attachments to the gallbladder which were carefully stripped down off  the gallbladder using blunt and cautery dissection, and infundibulum was  grasped and retracted inferolaterally.  Further fibrofatty tissue was  stripped off the neck of the gallbladder toward the porta hepatis.  Peritoneum anterior and posterior to Calot's triangle was incised.  The  distal gallbladder was thoroughly dissected, and the cystic duct  identified, the cystic duct/gallbladder junction dissected 3-60 degrees.  When the anatomy appeared clear, the cystic  duct was clipped at the  gallbladder junction and operative cholangiogram obtained through the  cystic duct.  This showed borderline dilated common bile duct and  intrahepatic ducts but flow into the duodenum and no definite filling  defects, although there may have been slight irregularity at the distal  common bile duct.  Following this, the cholangiocath was removed, and  the cystic duct was triply clipped proximally and divided.  Cystic  artery was seen coursing up the gallbladder wall, was divided between  clips, and the gallbladder was dissected free from its bed using hook  cautery and removed via the umbilicus.  Complete hemostasis  assured at  the operative site and the right upper quadrant irrigated and suctioned  until clear.  All CO2 was then evacuated, trocars were removed, and the  mattress suture secured at the umbilicus.  Skin incisions were closed  with subcuticular Monocryl and Dermabond.  Sponge, needle and instrument  counts were correct.  The patient was taken to recovery in good  condition.      Darene Lamer. Hoxworth, M.D.  Electronically Signed     BTH/MEDQ  D:  02/10/2008  T:  02/10/2008  Job:  [id number]

## 2010-11-26 NOTE — Consult Note (Signed)
[name], [name]             ACCOUNT NO.:  [id number]   MEDICAL RECORD NO.:  [id number]          PATIENT TYPE:  INP   LOCATION:  1228                         FACILITY:  [facility]   PHYSICIAN:  Adin Hector, MD     DATE OF BIRTH:  1990-11-27   DATE OF CONSULTATION:  02/09/2008  DATE OF DISCHARGE:  11/14/2007                                 CONSULTATION   PRIMARY CARE PHYSICIAN:  I do not know.   GASTROENTEROLOGIST:  Lowella Bandy. [name], MD with [facility]   REASON FOR CONSULTATION:  Possible common bile duct stones and  symptomatic gallstones.   HISTORY OF PRESENT ILLNESS:  [name] is a 20 year old female well-  known to Dr. [name] with a history of duodenal and ileal Crohn's.  She has had prior responses in the past to Humira and Asacol.  She has  had recurrent episodes of abdominal pain and is having a workup  including a CT scan of abdomen and pelvis, and EGD and colonoscopy that  were grossly negative.  She had some mild ileitis noted on the biopsies,  but otherwise not significant.   She says she gets intermittent pains in her upper abdomen then it will  radiate to her back.  She usually says it is on her left side and not  really on her right.  She has had episodes of nausea.  She had a  worsening episode today and actually had throwing up as well.  No  hematemesis or hematochezia.  It tended to be more in the left upper  quadrant.  She denies any sick contacts or travel history.  She denies  any significant heartburn.  She does have some reflux, but it is well-  controlled on Nexium.  Denies any worsening heartburn or reflux.  No  hematochezia or melena.  She notes she cannot tolerate greasy foods or  raw vegetables as that sets this off as well.  She denies any increase  in belching or hyperflatulence.  She normally can sleep down flat.   PAST MEDICAL HISTORY:  1. Crohn's disease with a duodenal and ileal inflammation in the past      responsive to  Humira, on chronic Asacol.  2. Iron deficiency anemia.   PAST SURGICAL HISTORY:  Negative.   MEDICATIONS:  Include:  1. Asacol.  2. Nexium.  3. Phenergan p.r.n.  4. Reglan q.h.s.  5. Hyoscyamine q.6 hours.  6. Darvocet p.r.n.  7. Humira pen subcu every other week.  8. Nitrofurantoin 2 times a day with food.  9. Citrucel with vitamin D tabs and iron tablets as well.   FAMILY HISTORY:  Paternal grandmother has breast cancer and maternal  uncle and cousin have diabetes and otherwise is negative.   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   REVIEW OF SYSTEMS:  As noted per HPI.  CONSTITUTIONAL:  No fevers,  chills, sweats, no weight gain or weight loss.  OPHTHALMOLOGICAL/ENT:  Negative.  CARDIAC AND RESPIRATORY:  Negative.  She has otherwise pretty  good physical activity  and exercise tolerance.  MUSCULOSKELETAL:  The  back soreness as above, but otherwise negative.  HEME/LYMPH/ALLERGIC:  Negative.  GYN AND BREASTS:  Negative.  HEPATIC/RENAL/ENDOCRINE:  Otherwise negative except for as noted per HPI.   PHYSICAL EXAMINATION:  VITAL SIGNS:  Her temperature of 98, blood  pressure 104/54, pulse 53, respirations 18, SAT is 99%, weight is 93.2  kg, height of 5 feet 3.  GENERAL:  She is a well-developed, well-nourished, overweight female,  sleeping, in no acute distress.  In fact, she awakens without any  difficulty, and no evidence of any dementia, delirium, psychosis, or  paranoia.  She answers questions appropriately though she is a little  bit sleepy.  HEENT:  She is normocephalic with no facial asymmetry, mucous membranes  are moist, nasopharynx and oropharynx are clear.  EYES:  Pupils equal, round, and reactive to light, extraocular movements  are intact, her sclerae are nonicteric, they are not injected.  NECK:  Supple without any masses; trachea is midline.  HEART:  Regular rate and rhythm, no murmurs, gallops, or rubs, she  is  slightly bradycardic.  CHEST:  Clear to auscultation bilaterally, no wheezes, rales, or  rhonchi, no pain or discomfort on compression.  ABDOMEN:  Obese but soft, she says she has had some mild upper abdominal  discomfort, but no true Murphy's sign and no major abdominal pain and no  inguinal pain.  GENITALIA/RECTAL:  Deferred given recent negative colonoscopy, and I  think she had a wet prep in the ER and it was already negative.  MUSCULOSKELETAL:  Full range of motion in the shoulders, elbows, wrists  as well as hips, knees, and ankles.  LYMPH:  No head and neck,  axillary or groin lymphadenopathy.  BREASTS:  Deferred.  SKIN:  No petechiae, no purpura, no other obvious source of lesions.  BACK:  Some mild discomfort in her upper mid thoracic back, but no focal  tenderness.   STUDIES:  She had an ultrasound, which shows a 7 mm common bile duct.  The radiologist tech thinks there is a common bile duct mass or stone.  I discussed with the radiologist and they think that there is an  argument for it, but they do not see any film images, they are just  plain images so it is difficult to say for certain.  There is no intra-  or extrahepatic ductal dilatation.  She has an obvious large gallstone.  She has no major gallbladder wall thickening or pericholecystic fluid.   She had an EGD and colonoscopy done earlier this month that were  negative except for some microscopic ileitis by biopsies, but not  grossly.  A CT of the abdomen and pelvis is negative with no ductal  dilatation and no obvious stones or common bile duct stones.   ASSESSMENT AND PLAN:  A 20 year old female with a history of Crohn's  disease with intermittent abdominal pains that seem to be related to  eating and a concern for a possible atypical presentation of biliary  colic and some questionable common bile duct stone.  1. She was admitted by Medicine, Dr. [name], and ERCP was attempted.      However, there was some  difficulty with respiratory issues and so      this has been delayed and will be reattempted tomorrow.  Another      option is to consider an MRI or CT to see if this confirms the      stone if the patient is  compliant.   I suspect she has symptomatic cholecystolithiasis and would benefit from  cholecystectomy.  Any reasonable option is to do a cholecystectomy with  cholangiogram first.  If the cholangiogram is completely negative and  that kind of ends the discussion, but if it is positive then we would go  ahead and proceed with more aggressive ERCP.  I will  defer to Dr. [name] of the primary service on this.  The technique of  gallbladder excision was discussed, the risks, benefits, and  alternatives discussed, questions were answered, and she agrees to  proceed, and I will discuss this again with Dr. Orland Mustard as well.      Adin Hector, MD  Electronically Signed     SCG/MEDQ  D:  02/09/2008  T:  02/09/2008  Job:  [id number]

## 2010-11-26 NOTE — Letter (Signed)
June 30, 2007    [name]  [address] [place], [place]   RE:  [name], [name]  MRN:  [id number]  /  DOB:  [birth year]     Sincerely,      Lowella Bandy. [name], MD    DMB/MedQ  DD: 06/30/2007  DT: 07/01/2007  Job #: [id number]

## 2010-11-26 NOTE — Assessment & Plan Note (Signed)
[place] OFFICE NOTE   NAME:JOHNSONChavon, [name]                    MRN:          [id number]  DATE:02/03/2007                            DOB:          10/10/1990    PROGRESS NOTE   [name] is an 20 year old African-American female with aggressive  Crohn's disease involving her terminal ilium as well as duodenum.  She  initially presented with duodenal ulcer, which was attributed to  ibuprofen, but it turned out to be Crohn's disease of the duodenum.  This was confirmed on several upper endoscopies, the last in February of  2008.  The biopsies showed duodenitis but no definite granuloma.  She  has been on Nexium 40 mg twice a day with reasonable control of her  abdominal pain.  She has had chronic iron deficiency anemia due to her  Crohn's disease and has been on all oral supplements.  Unfortunately,  she was found to be allergic to iron dextran infusion, and has not been  able to take INFeD infusions.  This results in chronic iron deficiency  anemia.  Her last hemoglobin was 11.7, hematocrit 34.6, with iron  saturation of 6%.  [name] applied for Humira Patient Assistance to  treat her Crohn's disease, but she was turned down.  This is very  unfortunate because she cannot take 6-MP over 50 mg a day due to  increased levels of 6-MMPN.  We tried her on 75 mg a day, but her 6-MMPN  levels were in hepatotoxic level.  On prednisone, she has gained at  least 50 pounds.  She initially started, when I first met her, at 170  pounds.  She is currently at 212 pounds.  Needless to say, she is very  depressed about it and quite teary-eyed today.   MEDICATIONS:  1. Asacol 400 mg 3 p.o. t.i.d.  2. Iron supplements 1 daily.  3. 6-MP 50 mg p.o. daily.  4. Prednisone down to 5 mg daily.  5. Nexium 40 mg p.o. b.i.d.  6. Darvocet-N 100 p.r.n. abdominal pain.  7. Phenergan 12.5 mg p.r.n.   [name] just graduated from high  school.  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   PHYSICAL EXAM:  Blood pressure 110/90, pulse 72, and weight 212 pounds.  The patient appeared depressed and cried today, being concerned about  her weight and worrying about everything.  She has a hard time sleeping  at night and she gets sleepy during the day.  Her level of energy has  been low.  LUNGS:  Clear to auscultation.  COR:  Normal S1, normal S2.  ABDOMEN:  Soft but tender in the epigastrium.  No rebound.  Bowel sounds  were active.  Right lower quadrant was normal.  RECTAL:  Not repeated.  EXTREMITIES:  No edema.   An 20 year old female with aggressive Crohn's disease of the terminal  ileum and duodenum.  The patient has been turned down for Humira Patient  Assistance program.  She is  currently on limited dose of 6Mercaptopurine  and tapering dose of prednisone.   PLAN:  1. Continue iron supplements.  Recheck CBC today.  2. Continue to decrease prednisone to 2.5 mg daily for 4 weeks, then      stop.  3. Appeal letter to [employer] for Patient Assistance program.  4. Continue Nexium 40 mg p.o. b.i.d. as well as Asacol and      6Mercaptopurine.  I will see her again in 5 weeks before school      starts.     [name]. [name], MD  Electronically Signed    DMB/MedQ  DD: 02/03/2007  DT: 02/03/2007  Job #: [id number]

## 2010-11-26 NOTE — Assessment & Plan Note (Signed)
[place] OFFICE NOTE   NAME:[name], [name]                    MRN:          [id number]  DATE:10/18/2007                            DOB:          July 25, 1990    The patient is an 20 year old female.  She is a Museum/gallery exhibitions officer at [employer].  She  has severe Crohn's disease involving her duodenum.  She initially  presented with anemia of iron deficiency which could not be corrected  because of allergy to iron dextran.  She initially showed Crohn's  disease in the terminal ileum as well, but on a repeat CT scan 7 years  later the terminal ileum appeared normal.  Her last colonoscopy was done  in August of 2005 and was normal.  Last upper endoscopy in February 2008  showed duodenitis without hemorrhage, but no evidence of current ulcer.  The patient has been on Humira 40 mg every 2 weeks since August 2008  with good response to the treatment.  She also was on 6-mercaptopurine  50 mg a day.  Her 6-MMPN levels were too high on 75 mg a day, but the 6-  MP had to be discontinued because of nausea.  It appears that the nausea  was not alleviated by stopping her 6-MP and so today we will be ready to  restart the 6-MP.   During today's visit the patient is complaining of some nausea.  It  occurs mostly at the end of the day.  There is no vomiting.  It is  usually relieved by Phenergan 12.5 mg a day.   MEDICATIONS:  1. Nexium 40 mg p.o. b.i.d.  2. Iron supplement.  3. Asacol 400 mg three p.o. three times a day.  4. Humira 40 mg q.2 weeks.   PHYSICAL EXAMINATION:  VITAL SIGNS:  Blood pressure 114/78, pulse 72,  and weight 206 pounds.  GENERAL:  She is alert and oriented in no distress.  LUNGS:  Clear to auscultation.  HEART:  Normal S1 and normal S2.  ABDOMEN:  Soft and nontender.  The usual tenderness in epigastrium was  not there.  No CVA tenderness.  No scar.  Lower abdomen was normal.  The  liver edge at costal  margin.  EXTREMITIES:  No edema.   IMPRESSION:  1. An 20 year old African American female with Crohn's disease of the      duodenum now under reasonably good control on Humira.  2. Nausea.  3. Overweight.  4. Eczema.   PLAN:  1. Restart 6-MP 50 mg daily.  2. Phenergan 12.5 mg dispense 30.  3. CBC, iron studies, CMET, and B12 levels today.  4. Nexium 40 mg p.o. b.i.d.  5. Start Reglan 10 mg at bedtime.  6. Continue Humira.  She will need to be precertified again in July of      this year  because she will be changing her insurance.     Lowella Bandy. [name], MD  Electronically Signed    DMB/MedQ  DD: 10/18/2007  DT: 10/18/2007  Job #: [id number]

## 2010-11-26 NOTE — Assessment & Plan Note (Signed)
[place] OFFICE NOTE   NAME:Hurlbutt, ANICKA STUCKERT                    MRN:          [id number]  DATE:06/30/2007                            DOB:          11/22/1990    Ms. Yarbro is a 20 year old African-American female. 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 The patient was diagnosed  with rather severe Crohn's disease of the duodenum and the terminal  ileum in April 2005 and has been on maximum medical therapy which  includes Humira 40 mg q.2 weeks, this was started in August 2008. She is  also on 6-MP 50 mg daily, patient unable to tolerated higher doses  because of increase in 6-MP and  she is on Asacol 400 mg, total of 3.6  gm  a day in 3 divided doses. She is to also continue Nexium 40 mg  b.i.d. and iron supplements. She is here today for followup. Her CT scan  of the abdomen on May 19, 2007 was actually completely normal  showing complete resolution of the terminal ileum inflammatory changes.  There was no narrowing or any edema around the terminal ileum which is a  change from the prior CT scan done 3 years ago. She still has  intermittent epigastric pain which occurred 3-4 times a week and are  usually responding to Tylenol. Her level of energy has been good. Her  last hemoglobin on last visit was 11.8, hematocrit 35.0. She is  intolerant to iron infusion. She saw a dermatologist for pigmented scars  on her thigh and was diagnosed with eczema.   PHYSICAL EXAMINATION:  Blood pressure 118/78, pulse 80 and weight 214  pounds which represents 5 pound weight loss.  She was in no distress.  LUNGS:  Clear to auscultation.  COR:  Quiet precordium, normal S1, normal S2.  ABDOMEN:  Soft but tender in the midline and epigastrium above the  umbilicus. The liver edge was at the costal margin.  RECTAL:  Not done.  EXTREMITIES:  No edema.   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   PLAN:  1. CBC and PROMETHEUS profile for IBD markers today.  2. The patient a letter to dean of [employer] concerning her medical      condition.  3. Continue all other medications. I will see her again over the      spring break in March 2009.     Lowella Bandy. [name], MD  Electronically Signed    DMB/MedQ  DD: 06/30/2007  DT: 06/30/2007  Job #: [id number]

## 2010-11-26 NOTE — Op Note (Signed)
NAMEELMER, BOUTELLE             ACCOUNT NO.:  [id number]   MEDICAL RECORD NO.:  [id number]          PATIENT TYPE:  INP   LOCATION:  1228                         [facility]ACILITY[facility]  [facility]   PHYSICIAN:  Lowella Bandy. [name], MD     DATE OF BIRTH:  Jan 20, 1991   DATE OF PROCEDURE:  02/09/2008  DATE OF DISCHARGE:                               OPERATIVE REPORT   PROCEDURE:  ERCP.   INDICATIONS:  This 20 year old African American female presented to  emergency room with acute abdominal pain and evidence of cholelithiasis  on upper abdominal ultrasound.  Her transaminases were mildly elevated.  This was a third episode of severe pain within the last 2 months.  The  patient has been treated for Crohn disease for past 4 years involving  the terminal ileum and colon as well as her duodenum. Most recent upper  endoscopy and colonoscopy as well as CT scan of the abdomen showed  Crohn's disease to be in excellent control using Humira and mesalamine.   ERCP is being done after ultrasound showed a retained common bile duct  stone; common bile duct measuring 7 mm.   ENDOSCOPE:  Olympus single channel  sideviewing duodenoscope.   SEDATION:  Versed 10 mg IV, fentanyl 100 mcg IV, Glucagon .5 mg IV.   FINDINGS:  Olympus single channel  duodenoscope passed through posterior  pharynx into esophagus and into the stomach. There was large amount of  retained solid and liquid food in the stomach, although the patient has  been n.p.o. for past 24 hours. The pylorus was normal. Duodenum showed a  mild stricture proximal to the biliary papilla which initially did not  admit the regular side being endoscope so the procedure was interrupted  to use a small caliber side being endoscope was reinserted and  introduced into the stomach into the duodenum. This time the endoscope  transversed through a duodenal stricture without difficulty.  Pipelle  was somewhat edematous.  It was cannulated, but only the main pancreatic  duct  filled.  At that point, the patient had significant respiratory  difficulty with tachycardia of 140 per minute and coarse rhonchi in her  right chest suggesting that she might have aspirated some of the gastric  contents.  At that point, we decided to terminate the procedure.   IMPRESSION:  1. Attempted ERCP, terminated due to respiratory difficulties.  2. Retained gastric contents, rule out aspiration.  3. 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  4. Normal main pancreatic duct.   PLAN:  Plans have been made to repeat the procedure in the morning using  propofol. We will also use Reglan IV to facilitate gastric emptying.  The patient will stay on under close observation for her respiratory  status and continue on the Unasyn 1.5 gram IV q.6 hours.  We will obtain  chest x-ray tonight.      Lowella Bandy. [name], MD  Electronically Signed     DMB/MEDQ  D:  02/09/2008  T:  02/09/2008  Job:  [id number]

## 2010-11-26 NOTE — Assessment & Plan Note (Signed)
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  She is  on a full medical treatment for Crohn's disease including Humira 15 mcg  every two weeks.  She started this in August after being approved, and  she has been tolerating it well except for initial severe headaches,  which now have almost subsided.  She has been taking a full course load  at [employer].  Her level of energy has been satisfactory.   CURRENT MEDICATIONS:  1. Nexium 40 mg p.o. b.i.d.  2. Iron supplements.  3. Asacol 400 mg three p.o. t.i.d.  4. 6-Mercaptopurine 15 mg p.o. daily.  5. Hyoscyamine 0.125 mg p.r.n.  6. She has not taken any pain medications.   She has FOOD INTOLERANCE TO SALADS AND HIGH ROUGHAGE FOODS.  She has  regular bowel habits.  Her weight has been slowly creeping up despite  her being off prednisone now for almost a month.   PHYSICAL EXAMINATION:  VITAL SIGNS:  Blood pressure 118/78, pulse 64,  and weight 219 pounds.  GENERAL:  She was in no distress.  HEENT:  The patient had mild facial acne.  LUNGS:  Clear to auscultation.  COR:  Normal S1 and normal S2.  ABDOMEN:  Soft and tender with epigastrium and above the umbilicus in  the midline, the lower abdomen was normal, bowel sounds were  normoactive.  RECTAL EXAM:  Not done.   IMPRESSION:  An 20 year old, African-American female with Crohn's  disease of the terminal ileum and descending duodenum currently  responding to Humira.   PLAN:  1. Continue Humira, 6-MP, Asacol, and Nexium.  2. CBC today.  3. Office visit in two months.     Lowella Bandy.  [name], MD  Electronically Signed    DMB/MedQ  DD: 04/30/2007  DT: 05/02/2007  Job #: [id number]   cc:   [place]

## 2010-11-26 NOTE — Letter (Signed)
September 19, 2009    [name]. Benay Spice, MD.  [address] [place] [id number]   RE:  [name], [name]  MRN:  [id number]  /  DOB:  12-22-1990   Dear Leroy Sea,   I would appreciate your evaluation of [name] for iron  deficiency anemia and chronic GI blood loss.  She has a severe Crohn  disease of the duodenum and terminal ileum and has been on multiple  immunomodulators, biologicals, and intermittently on steroids.  Her  problem has been chronic iron deficiency, which is not being corrected  by oral iron.  She is allergic to INFED and another iron preparation,  which I do not remember at this time.  I would like your assistance as  to correction how we can correct her iron deficiency.  She is very  pleasant and we will keep her appointment.    Sincerely,      Lowella Bandy. [name], MD    DMB/MedQ  DD: 09/19/2009  DT: 09/20/2009  Job #: [id number]

## 2010-11-26 NOTE — H&P (Signed)
NAMEMARIN, WISNER             ACCOUNT NO.:  [id number]   MEDICAL RECORD NO.:  [id number]          PATIENT TYPE:  INP   LOCATION:  1228                         FACILITY:  [facility]   PHYSICIAN:  Lowella Bandy. [name], MD     DATE OF BIRTH:  [birth year]   DATE OF ADMISSION:  02/09/2008  DATE OF DISCHARGE:                              HISTORY & PHYSICAL   CHIEF COMPLAINT:  Recurrent upper abdominal pain, nausea and vomiting  with cholelithiasis and choledocholithiasis on ultrasound today.   HISTORY:  Reather is a 20 year old African American female well known to  Dr. [name] who has history of Crohn's disease which has involved  her duodenum and the terminal ileum.  She has been managed with Humira  and is also on Asacol at this time with good response.  She has had  recent onset of recurrent episodes of abdominal pain and outpatient  workup with CT of the abdomen and pelvis done January 18, 2008, was  negative.  She then underwent upper endoscopy and colonoscopy with Dr.  [name] on January 31, 2008, both were negative for any evidence of active  Crohn's.  Biopsies did show microscopic evidence of Crohn's in the  terminal ileum.  At this time she presents to the emergency room with  another episode of more severe epigastric and left upper quadrant pain  associated with nausea and vomiting.  This is very similar to her other  episodes of pain and is probably the fourth episode.  This one lasted  much longer and the pain did not subside.  She has had no associated  fever or chills, no diarrhea, no melena or hematochezia.  She describes  her pain as intense, radiating from the epigastrium somewhat to the left  upper quadrant.  She was seen and evaluated by the ER staff.  Abdominal  ultrasound was done showing gallstones, appears to be one stone lodged  in the neck of the gallbladder.  She also has a common bile duct of 7 mm  and there is a linear density seen in the common bile duct.  At this  time  she is admitted for supportive management, will plan ERCP with  stone extraction and then laparoscopic cholecystectomy.   CURRENT MEDICATIONS:  1. Asacol 400 three p.o. t.i.d.  2. Nexium 40 b.i.d.  3. Phenergan p.r.n.  4. Reglan 10 nightly.  5. Symax p.r.n.  6. Darvocet N-100 p.r.n.  7. P.r.n. Humira pen 40/0.8 subcutaneous q. every other week.  8. Iron supplement daily.  9. Citracal daily.   ALLERGIES:  IV IRON INFUSION WHICH CAUSED ANAPHYLAXIS.   LABORATORY STUDIES:  Today WBC of 7.3, hemoglobin 12.8, hematocrit of  38, MCV of 90, electrolytes within normal limits, creatinine 0.68, total  bilirubin 0.9, alk phos 63, OT 69, PT 41, lipase was 21, beta hCG  negative and UA negative.   PAST HISTORY:  Pertinent for Crohn's as outlined above and iron  deficiency anemia.  She has been otherwise healthy and has not had any  previous surgeries.   FAMILY HISTORY:  Maternal grandmother with breast cancer.  Family  history is positive for diabetes on the paternal side.   SOCIAL HISTORY:  The patient is a Ship broker at [employer], she is a nonsmoker,  nondrinker.   PHYSICAL EXAM:  Well-developed African American female in no acute  distress.  She is uncomfortable, alert and oriented x3, complaining of  nausea.  Temperature is 97.6, blood pressure 122/78, pulse in the 70s,  iron sats 100 on room air.  HEENT:  Nontraumatic/ normocephalic, EOMI, PERRLA, sclerae are  anicteric.  NECK:  Supple, there is no JVD.  CARDIOVASCULAR:  Regular rate and rhythm with S1 and S2, no murmur, rub  or gallop.  PULMONARY:  Clear to A&P.  ABDOMEN:  Large, soft, she is tender in the epigastrium.  There is no  guarding or rebound, no mass or hepatosplenomegaly, bowel sounds are  active.  RECTAL:  Exam is not done today.  EXTREMITIES:  No clubbing, cyanosis or edema.  NEURO:  The patient is alert and oriented x3 and exam is grossly  nonfocal.   REVIEW OF SYSTEMS:  GI: As outlined above.  CARDIOVASCULAR:  Denies  any  chest pain or anginal symptoms.  PULMONARY:  Negative for cough,  shortness of breath or sputum production.  GENERAL:  The patient denies  any chills or fevers, she has had nausea.  MUSCULOSKELETAL:  Negative.  NEURO:  Negative.  All other review of systems negative.   IMPRESSION:  1. A 20 year old female with history of Crohn's involving the duodenum      and terminal ileum which is quiescent on Humira, now with recurrent      episodes of upper abdominal pain and ultrasound finding of      choledocholithiasis, cholelithiasis and a cystic duct stone.  2. History of iron-deficiency anemia.   PLAN:  The patient is admitted to the service of Dr. [name] where  she will be covered with IV antiemetics, analgesics as needed.  We will  place her on IV Unasyn and plan ERCP with stone extraction later this  afternoon with Dr. [name] and then surgical consultation for  laparoscopic cholecystectomy.  For details please see the orders.      Amy Matinecock, PA-C      Dora M. [name], MD  Electronically Signed    AE/MEDQ  D:  02/10/2008  T:  02/10/2008  Job:  [id number]   cc:   Adin Hector, MD  [place] [address]

## 2010-11-26 NOTE — Letter (Signed)
June 30, 2007    [facility] [employer] [facility] in [place], [place]:  [name], [name]  MRN:  [id number]  /  DOB:  1990/07/26   Dear Lessie Dings:   This letter concerns your student and our patient, [name],  who has been diagnosed with complicated, rather severe Crohn's disease  in March 2005.  She has been undergoing diagnostic tests, infusions  of  medications and multiple office visits with me and with several other  doctors.  Although she is much better on a rather complex medical  regimen and she is able to attend school, she is likely to need frequent  doctor's visits and treatments, which some of them may take more than a  day out of her academic schedule.  I understand that she is trying to  make up the classes that she misses and that puts a lot of pressure on  her.  I would like to request your understanding with her case if she  misses classes or if she does not perform up to her standard due to  absence from school.  Feel free to page me at [phone] or call my office  at [phone] to discuss or to confirm her absence.  I will be happy to  discuss her plan of treatment with you.    Sincerely,      Lowella Bandy. [name], MD  Electronically Signed    DMB/MedQ  DD: 06/30/2007  DT: 07/01/2007  Job #: [id number]   CC:    Matilde Sprang

## 2010-11-26 NOTE — Assessment & Plan Note (Signed)
[place] OFFICE NOTE   NAME:JOHNSONAnecia, Nusbaum                    MRN:          [id number]  DATE:03/03/2007                            DOB:          [birth year]    [name] is an 20 year old African-American female with severe Crohn's  disease of the duodenum and the terminal ileum.  She was just started on  Humira 50 mg 72 weeks.  She is finishing her loading dose, and will  start her own injections next week.  She is doing well today.  There was  one episode of diarrhea following last injection.  There was also some  nausea after the injection, but, overall, she is doing quite well.  Last  appointment 1 month ago showed hemoglobin of 12.6.   MEDICATIONS:  1. Nexium 40 mg p.o. b.i.d.  2. Iron 1 p.o. daily.  3. Asacol 400 mg 3 p.o. t.i.d.  4. Prednisone 2.5 mg a day.  She will be finishing up her prednisone      at the end of this week.  5. 6-mercaptopurine 50 mg p.o. daily.  6. Humira 50 mg every 2 weeks.   Last upper endoscopy February of 2008 showed duodenitis.  On a biopsy  this was a nonspecific duodenitis without granulomas.   PHYSICAL EXAMINATION:  Blood pressure 118/82, pulse 68 and weight is 212  pounds.  She was alert and oriented in no distress.  LUNGS:  Clear to auscultation.  COR:  Normal S1, normal S2.  ABDOMEN:  Soft, tender in the epigastrium.  Normoactive bowel sounds.  Lower abdomen was normal.  RECTAL EXAM:  Not repeated.  EXTREMITIES:  No edema.   IMPRESSION:  An 20 year old with Crohn's disease of the duodenum and the  terminal ileum, currently doing well on Humira.   PLAN:  1. Continue Humira as ordered.  2. Continue Nexium 40 mg p.o. b.i.d.  3. Discontinue prednisone at the end of this week.  4. Continue 6-mercapturone 50 mg daily for now, as well as Asacol 1.2      g 3 times a day.  5. I will see her again in 8 weeks.  At that time we will repeat CBC.    Lowella Bandy. Anita Perches,  MD  Electronically Signed   DMB/MedQ  DD: 03/03/2007  DT: 03/03/2007  Job #: [id number]

## 2010-11-29 NOTE — Assessment & Plan Note (Signed)
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  She has been much improved from  prednisone 30 mg a day, which started September 07, 2006.  She is also on  iron supplements, 6 mercaptopurine 50 mg a day, Nexium 40 mg once a day,  and Asacol 400 mg, total of 3.6 grams a day.  She denies any nausea.  She has had diarrhea for the past couple of days as a result of a viral  gastroenteritis.  Otherwise, she has done quite well.   PHYSICAL EXAMINATION:  VITAL SIGNS:  Blood pressure 106/70.  Pulse 56.  Weight 203 pounds, which represents a six pound weight gain.  GENERAL:  She appears cushingoid, clearly overweight.  LUNGS:  Clear to auscultation.  CARDIAC:  Normal S1, normal S2.  ABDOMEN:  Soft, nontender, with normoactive bowel sounds.  I am unable  to elicit any tenderness.  RECTAL:  Exam not repeated.   IMPRESSION:  An 20 year old with Crohn's disease at the ileocecal valve  as well as the descending duodenum, doing well on prednisone.  We are  making plans to start her on Humira whenever her insurance company and  patient assistance program will approve it.   PLAN:  1. Decrease prednisone to 20 mg a day for four weeks, then to 15 mg      every two weeks.  2. Refill for prednisone.  3. Await Humira decision.  I would like to start patient as soon as      possible.     Lowella Bandy. [name], MD  Electronically Signed    DMB/MedQ  DD: 10/27/2006  DT: 10/28/2006  Job #: [id number]

## 2010-11-29 NOTE — Assessment & Plan Note (Signed)
[place] OFFICE NOTE   NAME:[name], [name]                    MRN:          [id number]  DATE:09/01/2006                            DOB:          Nov 01, 1990    [name] is a 20 year old young lady with Crohn's disease of the duodenum  and of the terminal ileum, initially diagnosed in 2005, when she  underwent upper endoscopy and colonoscopy.  The disease showed on  biopsies of the terminal ileum.  Small bowel followthrough showed  Crohn's disease in the descending duodenum as well as the terminal ileum  with some separation of the bowel loops in the mesentery and mucosal  irregularities of the terminal ileum.  She has been on:  1. Nexium 40 mg a day.  2. Asacol 3.6 grams a day.  3. 6-mercaptupurine 50 mg a day.   She was found to be recently anemic again, hemoglobin down to 11.7 grams  and hematocrit 34.6 with low serum iron of 6.6%.  She is here today  because of intermittent nausea which occurs several times a week.  She  has not missed any school, but it has been quite bothersome.  There has  been no vomiting and no pain.  Her bowel habits are regular.  There has  been no fever.  Her weight has gradually increased.   MEDICATIONS:  As listed above.   PHYSICAL EXAMINATION:  Blood pressure 128/76, pulse 64, weight 200.6  pounds.  She was alert, oriented, in no distress.  LUNGS:  Clear to auscultation.  COR:  Normal S1, normal S2.  ABDOMEN:  Soft but very tender in the epigastrium right above the  umbilicus in the midline and also in the right lower quadrant overlying  the terminal ileum.  Bowel sounds were normoactive.  There was no  rebound and no palpable mass.  RECTAL:  Exam not done.  EXTREMITIES:  No edema.   IMPRESSION:  20-year-old female with Crohn's disease of the  terminal ileum and duodenum with some increasing nausea indicating  possibility of recurrent Crohn's disease in  descending duodenum.   PLAN:  1. Upper endoscopy.  2. Increase Nexium to 40 mg p.o. b.i.d.  3. Possible colonoscopy.  4. We may possibly use a short course of steroids, but I would be      reluctant to do that at this      point.  I would also consider increase of her 6-MP to 75 mg a day,      it all depends on the results of the upper endoscopy.     Lowella Bandy. [name], MD  Electronically Signed    DMB/MedQ  DD: 09/01/2006  DT: 09/02/2006  Job #: [id number]

## 2010-11-29 NOTE — Assessment & Plan Note (Signed)
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 Duodenal Crohn's disease was  reconfirmed on recent upper endoscopy on September 07, 2006.  Biopsies of  the duodenum showed inflammatory changes consistent with IBD. We have  put her back on prednisone 30 mg a day in addition to 6-MP 50 mg a day  and Nexium as well as Asacol 400 mg 3tablets  times a day. She has been  improved symptomatically in terms of intensity and frequency of the  epigastric pain. Her lab tests show mild anemia of hemoglobin 11.7, and  low iron of 6%.   PHYSICAL EXAMINATION:  Blood pressure 118/78, pulse 76, weight 197  pounds. She was alert and oriented in no distress.  LUNGS: Clear to auscultation.  COR: With normal S1, normal S2.  ABDOMEN: Soft with minimal tenderness in mid upper gastric area. Normal  left and right upper quadrants. Normal abdomen.  RECTAL EXAM: Not done.   IMPRESSION:  A 20 year old [place] American female with Crohn's disease  of the duodenum and of the terminal ileum currently improved  symptomatically on prednisone 30 mg a day. She is about 80% improved.  She also is on immunomodulator 6MP and mesalamine and Nexium.   PLAN:  1. I have discussed possibility of starting Humira as __________      biological for her to control the duodenal Chron's disease. I think      she would be a good candidate because of the concern about      possibility of developing duodenal strictures in the future. She      for now will stay on prednisone 30 mg a day until I see her in      about 6 weeks.  2. Continue iron supplements. PATIENT IS ALLERGIC TO IRON DEXTRAN, so      she really dependent on all iron  supplements.     [name]. Anita Perches, MD  Electronically Signed    [name]/[name]  DD: 09/24/2006  DT: 09/25/2006  Job #: [id number]

## 2010-12-05 ENCOUNTER — Other Ambulatory Visit: Payer: Self-pay | Admitting: Internal Medicine

## 2010-12-05 ENCOUNTER — Ambulatory Visit (INDEPENDENT_AMBULATORY_CARE_PROVIDER_SITE_OTHER): Payer: Self-pay | Admitting: *Deleted

## 2010-12-05 DIAGNOSIS — I82C19 Acute embolism and thrombosis of unspecified internal jugular vein: Secondary | ICD-10-CM

## 2010-12-05 NOTE — Telephone Encounter (Signed)
Vicodin #30, 1 or 1/2  po q 6 hrs prn severe pain, ,Use magnesium Oxide pills 461m ( OTC) 1 po qd for constipation

## 2010-12-05 NOTE — Telephone Encounter (Signed)
Error

## 2010-12-05 NOTE — Telephone Encounter (Signed)
Patient calling and asking for pain medication for pain in her incision area. States she has been released from Dr. Brantley Stage and he used to give her this medication(Percocet). States the pain is incisional and she has has stomach swelling and gas pain. Patient states it is not pain like her Crohn's pain. She also is c/o constipation that has caused some blood on stool and when she is wiping. Please, advise.

## 2010-12-06 MED ORDER — HYDROCODONE-ACETAMINOPHEN 5-500 MG PO TABS: ORAL_TABLET | ORAL | Status: DC

## 2010-12-06 NOTE — Telephone Encounter (Signed)
Left a message for patient to call me. Rx faxed to pharmacy.

## 2010-12-06 NOTE — Telephone Encounter (Signed)
Left message for patient to call me again.

## 2010-12-10 ENCOUNTER — Telehealth: Payer: Self-pay | Admitting: *Deleted

## 2010-12-10 NOTE — Telephone Encounter (Signed)
Patient calling to report that she having hair loss. She thinks it might be due to the 6MP she is taking.

## 2010-12-10 NOTE — Telephone Encounter (Signed)
Patient returned my calls. Gave patient Dr. Nichola Sizer recommendations. She will pick up her medications at her rx

## 2010-12-10 NOTE — Telephone Encounter (Signed)
6MP may cause hair loss, depending how she is doing with her Crohn'd disease and how much hair she is actually loosing, she may decrease her 6MP to 25 mg /day ( 1/2 tab daily)

## 2010-12-10 NOTE — Telephone Encounter (Signed)
Left a message for patient to call me. 

## 2010-12-11 NOTE — Telephone Encounter (Signed)
Patient given Dr. Nichola Sizer recommendations.

## 2010-12-12 ENCOUNTER — Ambulatory Visit (INDEPENDENT_AMBULATORY_CARE_PROVIDER_SITE_OTHER): Payer: Self-pay | Admitting: *Deleted

## 2010-12-12 DIAGNOSIS — I82C19 Acute embolism and thrombosis of unspecified internal jugular vein: Secondary | ICD-10-CM

## 2010-12-12 LAB — POCT INR: INR: 1.2

## 2010-12-13 ENCOUNTER — Encounter: Payer: Self-pay | Admitting: *Deleted

## 2010-12-19 ENCOUNTER — Ambulatory Visit (INDEPENDENT_AMBULATORY_CARE_PROVIDER_SITE_OTHER): Payer: Self-pay | Admitting: *Deleted

## 2010-12-19 DIAGNOSIS — I82C19 Acute embolism and thrombosis of unspecified internal jugular vein: Secondary | ICD-10-CM

## 2010-12-19 MED ORDER — WARFARIN SODIUM 10 MG PO TABS: ORAL_TABLET | ORAL | Status: DC

## 2010-12-26 ENCOUNTER — Ambulatory Visit (INDEPENDENT_AMBULATORY_CARE_PROVIDER_SITE_OTHER): Payer: Medicaid Other | Admitting: *Deleted

## 2010-12-26 DIAGNOSIS — I82C19 Acute embolism and thrombosis of unspecified internal jugular vein: Secondary | ICD-10-CM

## 2010-12-26 LAB — POCT INR: INR: 1.1

## 2011-01-02 ENCOUNTER — Ambulatory Visit (INDEPENDENT_AMBULATORY_CARE_PROVIDER_SITE_OTHER): Payer: Self-pay | Admitting: *Deleted

## 2011-01-02 DIAGNOSIS — I82C19 Acute embolism and thrombosis of unspecified internal jugular vein: Secondary | ICD-10-CM

## 2011-01-02 LAB — POCT INR: INR: 1.6

## 2011-01-09 ENCOUNTER — Ambulatory Visit (INDEPENDENT_AMBULATORY_CARE_PROVIDER_SITE_OTHER): Payer: Self-pay | Admitting: *Deleted

## 2011-01-09 DIAGNOSIS — I82C19 Acute embolism and thrombosis of unspecified internal jugular vein: Secondary | ICD-10-CM

## 2011-01-20 ENCOUNTER — Ambulatory Visit (INDEPENDENT_AMBULATORY_CARE_PROVIDER_SITE_OTHER): Payer: Self-pay | Admitting: *Deleted

## 2011-01-20 DIAGNOSIS — I82C19 Acute embolism and thrombosis of unspecified internal jugular vein: Secondary | ICD-10-CM

## 2011-01-23 ENCOUNTER — Telehealth: Payer: Self-pay | Admitting: Internal Medicine

## 2011-01-23 NOTE — Telephone Encounter (Signed)
Patient is calling with 2 questions: 1) She wants to know if we have heard anything about her Humira. Told her I will check with Dottie on this. 2) She would like a note from Dr. [name] stating she can return to school in the Fall. (They have told her she cannot register for more than 13 hours without this)

## 2011-01-23 NOTE — Telephone Encounter (Signed)
OK we will write a note. I ought to see her in the office before she goes back to  School.

## 2011-01-24 NOTE — Telephone Encounter (Signed)
Left a message for patient to call me to schedule an appointment prior to her returning to school.

## 2011-01-24 NOTE — Telephone Encounter (Signed)
Patient scheduled to see Dr. [name] on 02/12/11 at 8:45 AM. Patient notified we have not heard from Humira. Patient will call them.

## 2011-01-24 NOTE — Telephone Encounter (Signed)
Letter has been created and is awaiting Dr Nichola Sizer signature.

## 2011-01-27 ENCOUNTER — Ambulatory Visit (INDEPENDENT_AMBULATORY_CARE_PROVIDER_SITE_OTHER): Payer: Self-pay | Admitting: *Deleted

## 2011-01-27 DIAGNOSIS — I82C19 Acute embolism and thrombosis of unspecified internal jugular vein: Secondary | ICD-10-CM

## 2011-01-27 NOTE — Telephone Encounter (Signed)
Left a message for patient that her letter was mailed on Friday and if she needs to pick up a copy before the mail delivers it to let me know.

## 2011-01-31 ENCOUNTER — Other Ambulatory Visit (INDEPENDENT_AMBULATORY_CARE_PROVIDER_SITE_OTHER): Payer: Self-pay

## 2011-01-31 ENCOUNTER — Telehealth: Payer: Self-pay | Admitting: Internal Medicine

## 2011-01-31 ENCOUNTER — Other Ambulatory Visit: Payer: Self-pay | Admitting: Internal Medicine

## 2011-01-31 DIAGNOSIS — R110 Nausea: Secondary | ICD-10-CM

## 2011-01-31 DIAGNOSIS — K5090 Crohn's disease, unspecified, without complications: Secondary | ICD-10-CM

## 2011-01-31 LAB — COMPREHENSIVE METABOLIC PANEL
BUN: 13 mg/dL (ref 6–23)
CO2: 27 mEq/L (ref 19–32)
Calcium: 8.6 mg/dL (ref 8.4–10.5)
Chloride: 104 mEq/L (ref 96–112)
Creatinine, Ser: 0.6 mg/dL (ref 0.4–1.2)
GFR: 167.2 mL/min (ref >60.00)
Glucose, Bld: 106 mg/dL — ABNORMAL HIGH (ref 70–99)
Total Bilirubin: 0.2 mg/dL — ABNORMAL LOW (ref 0.3–1.2)

## 2011-01-31 LAB — CBC WITH DIFFERENTIAL/PLATELET
Basophils Absolute: 0 10*3/uL (ref 0.0–0.1)
Basophils Relative: 0.4 % (ref 0.0–3.0)
Eosinophils Relative: 0.5 % (ref 0.0–5.0)
HCT: 33.6 % — ABNORMAL LOW (ref 36.0–46.0)
Hemoglobin: 10.9 g/dL — ABNORMAL LOW (ref 12.0–15.0)
Lymphs Abs: 1.3 10*3/uL (ref 0.7–4.0)
Monocytes Relative: 7.9 % (ref 3.0–12.0)
Neutro Abs: 5.8 10*3/uL (ref 1.4–7.7)
RBC: 4.14 Mil/uL (ref 3.87–5.11)
RDW: 20.9 % — ABNORMAL HIGH (ref 11.5–14.6)

## 2011-01-31 MED ORDER — PREDNISONE 5 MG PO TABS: ORAL_TABLET | ORAL | Status: DC

## 2011-01-31 MED ORDER — PROMETHAZINE HCL 25 MG PO TABS: 25.0000 mg | ORAL_TABLET | ORAL | Status: DC | PRN

## 2011-01-31 MED ORDER — CIPROFLOXACIN HCL 250 MG PO TABS: ORAL_TABLET | ORAL | Status: DC

## 2011-01-31 MED ORDER — HYDROCODONE-ACETAMINOPHEN 5-500 MG PO TABS: ORAL_TABLET | ORAL | Status: DC

## 2011-01-31 MED ORDER — DICYCLOMINE HCL 10 MG PO CAPS: ORAL_CAPSULE | ORAL | Status: DC

## 2011-01-31 NOTE — Telephone Encounter (Signed)
Patient calling to report pain in the middle of stomach below the belly button that began 2 days ago. Pain was worse last night. She took Hydrocodone  Last night and it made her nauseous. She did take Phenergan for the nausea. She took Tramadol today for pain but this did not help either. Blood in stool x 2 days. Diarrhea 3 times yesterday. Denies fever but is having night sweats. Spoke with Dr. [name]. May refill Vicodan, Phenergan. Rx for Cipro 250 mg BID x 7 days. Bentyl prn abdominal cramps. Patient notified and instructed to let the Coumadin clinic know she is going to take Cipro. Rx sent to pharmacy

## 2011-01-31 NOTE — Telephone Encounter (Signed)
Patient calling to report she is feeling weak, sweating and has nausea. States it is worse when she stands. Temp 97.1. She took the Phenergan then the [place]. States she is taking 6 MP, Coumadin, Humira(took yesterday), Wellbutrin. Spoke with Dr. [name] Orders given: stat CBC, CMET today. Prednisone 20 mg po daily x 3 days, then 15 mg daily x 3 days then 10 mg po x 3 days then 5 mg po x 3 days then stop. Patient notified and coming for labs now. Rx to pharmacy.

## 2011-02-03 ENCOUNTER — Telehealth: Payer: Self-pay | Admitting: *Deleted

## 2011-02-03 ENCOUNTER — Ambulatory Visit (INDEPENDENT_AMBULATORY_CARE_PROVIDER_SITE_OTHER): Payer: Self-pay | Admitting: *Deleted

## 2011-02-03 DIAGNOSIS — I82C19 Acute embolism and thrombosis of unspecified internal jugular vein: Secondary | ICD-10-CM

## 2011-02-03 LAB — PROTIME-INR
INR: 9.7 ratio (ref 0.8–1.0)
Prothrombin Time: 86.3 s (ref 10.2–12.4)

## 2011-02-03 LAB — POCT INR: INR: 7

## 2011-02-03 NOTE — Telephone Encounter (Signed)
Left message for patient that labs were normal

## 2011-02-03 NOTE — Telephone Encounter (Signed)
Message copied by Hulan Saas on Mon Feb 03, 2011  8:22 AM ------      Message from: [place] Dragon      Created: Sat Feb 01, 2011  9:49 PM       Please call pt with normal results.

## 2011-02-05 ENCOUNTER — Emergency Department (HOSPITAL_COMMUNITY)
Admission: EM | Admit: 2011-02-05 | Discharge: 2011-02-05 | Disposition: A | Discharge status: Home / Self Care | Payer: Self-pay | Attending: Emergency Medicine | Admitting: Emergency Medicine

## 2011-02-05 ENCOUNTER — Emergency Department (HOSPITAL_COMMUNITY): Payer: Self-pay

## 2011-02-05 ENCOUNTER — Telehealth: Payer: Self-pay | Admitting: Internal Medicine

## 2011-02-05 DIAGNOSIS — R079 Chest pain, unspecified: Secondary | ICD-10-CM | POA: Insufficient documentation

## 2011-02-05 DIAGNOSIS — R112 Nausea with vomiting, unspecified: Secondary | ICD-10-CM | POA: Insufficient documentation

## 2011-02-05 DIAGNOSIS — Z9889 Other specified postprocedural states: Secondary | ICD-10-CM | POA: Insufficient documentation

## 2011-02-05 DIAGNOSIS — Z86718 Personal history of other venous thrombosis and embolism: Secondary | ICD-10-CM | POA: Insufficient documentation

## 2011-02-05 DIAGNOSIS — Z9089 Acquired absence of other organs: Secondary | ICD-10-CM | POA: Insufficient documentation

## 2011-02-05 DIAGNOSIS — R109 Unspecified abdominal pain: Secondary | ICD-10-CM | POA: Insufficient documentation

## 2011-02-05 DIAGNOSIS — R197 Diarrhea, unspecified: Secondary | ICD-10-CM | POA: Insufficient documentation

## 2011-02-05 DIAGNOSIS — Z8719 Personal history of other diseases of the digestive system: Secondary | ICD-10-CM | POA: Insufficient documentation

## 2011-02-05 DIAGNOSIS — N83209 Unspecified ovarian cyst, unspecified side: Secondary | ICD-10-CM | POA: Insufficient documentation

## 2011-02-05 LAB — URINALYSIS, ROUTINE W REFLEX MICROSCOPIC
Ketones, ur: NEGATIVE mg/dL
Leukocytes, UA: NEGATIVE
Nitrite: NEGATIVE
Specific Gravity, Urine: 1.008 (ref 1.005–1.030)
pH: 6.5 (ref 5.0–8.0)

## 2011-02-05 LAB — COMPREHENSIVE METABOLIC PANEL
AST: 12 U/L (ref 0–37)
CO2: 27 mEq/L (ref 19–32)
Calcium: 9.3 mg/dL (ref 8.4–10.5)
Creatinine, Ser: 0.59 mg/dL (ref 0.50–1.10)
GFR calc Af Amer: >60 mL/min (ref >60)
GFR calc non Af Amer: >60 mL/min (ref >60)
Total Protein: 7.7 g/dL (ref 6.0–8.3)

## 2011-02-05 LAB — DIFFERENTIAL
Basophils Absolute: 0 10*3/uL (ref 0.0–0.1)
Lymphocytes Relative: 28 % (ref 12–46)
Lymphs Abs: 2.3 10*3/uL (ref 0.7–4.0)
Neutro Abs: 5.4 10*3/uL (ref 1.7–7.7)

## 2011-02-05 LAB — LACTIC ACID, PLASMA: Lactic Acid, Venous: 1 mmol/L (ref 0.5–2.2)

## 2011-02-05 LAB — CBC
HCT: 28.4 % — ABNORMAL LOW (ref 36.0–46.0)
Hemoglobin: 9.2 g/dL — ABNORMAL LOW (ref 12.0–15.0)
MCV: 79.3 fL (ref 78.0–100.0)
RBC: 3.58 MIL/uL — ABNORMAL LOW (ref 3.87–5.11)
RDW: 18 % — ABNORMAL HIGH (ref 11.5–15.5)
WBC: 8.3 10*3/uL (ref 4.0–10.5)

## 2011-02-05 LAB — HCG, QUANTITATIVE, PREGNANCY: hCG, Beta Chain, Quant, S: 1 m[IU]/mL (ref <5)

## 2011-02-05 LAB — POCT PREGNANCY, URINE: Preg Test, Ur: NEGATIVE

## 2011-02-05 LAB — URINE MICROSCOPIC-ADD ON

## 2011-02-05 LAB — WET PREP, GENITAL

## 2011-02-05 LAB — PROTIME-INR: INR: 3.36 — ABNORMAL HIGH (ref 0.00–1.49)

## 2011-02-05 MED ORDER — IOHEXOL 300 MG/ML  SOLN
100.0000 mL | Freq: Once | INTRAMUSCULAR | Status: AC | PRN
  Administered 2011-02-05: 100 mL via INTRAVENOUS

## 2011-02-05 NOTE — Telephone Encounter (Signed)
Mom reports pt has has increased pain since she started tapering the Prednisone from 32m x 3 days beginning 01/31/11 to 117mdaily x 3 days... She reports pt has no appetite and hydrocodone isn't helping the gas, bloating and cramping. Notified TeCoralyn Markmom to increase the Prednisone back to 2048maily per Dr BroOlevia Perchesd she already has an appt on 02/12/11 at 0845am. She is to call if pt gets worse; mom stated understanding.

## 2011-02-05 NOTE — Telephone Encounter (Signed)
lmom to call back 

## 2011-02-07 ENCOUNTER — Ambulatory Visit (INDEPENDENT_AMBULATORY_CARE_PROVIDER_SITE_OTHER): Payer: Self-pay | Admitting: *Deleted

## 2011-02-07 DIAGNOSIS — I82C19 Acute embolism and thrombosis of unspecified internal jugular vein: Secondary | ICD-10-CM

## 2011-02-07 LAB — POCT INR: INR: 1.8

## 2011-02-12 ENCOUNTER — Ambulatory Visit (INDEPENDENT_AMBULATORY_CARE_PROVIDER_SITE_OTHER): Payer: Self-pay | Admitting: *Deleted

## 2011-02-12 ENCOUNTER — Encounter: Payer: Self-pay | Admitting: Internal Medicine

## 2011-02-12 ENCOUNTER — Ambulatory Visit (INDEPENDENT_AMBULATORY_CARE_PROVIDER_SITE_OTHER): Payer: Self-pay | Admitting: Internal Medicine

## 2011-02-12 VITALS — BP 120/82 | HR 96 | Ht 64.0 in | Wt 218.0 lb

## 2011-02-12 DIAGNOSIS — K5000 Crohn's disease of small intestine without complications: Secondary | ICD-10-CM

## 2011-02-12 DIAGNOSIS — I82C19 Acute embolism and thrombosis of unspecified internal jugular vein: Secondary | ICD-10-CM

## 2011-02-12 DIAGNOSIS — K5090 Crohn's disease, unspecified, without complications: Secondary | ICD-10-CM

## 2011-02-12 LAB — POCT INR: INR: 2.3

## 2011-02-12 NOTE — Progress Notes (Signed)
[name] 08-28-1990 MRN [id number]    History of Present Illness:  This is a 20 year old African American female with Crohn's disease of the duodenum, terminal ileum and colon. She is status post ileocecectomy with a diverting loop ileostomy in November 2011 and takedown of ileostomy in January 2012 by Dr.Cornet. She is planning to go back to school full time in the next 2 weeks. She had a flareup of her Crohn's disease 2 weeks ago with nausea, vomiting and diarrhea. We started her on 20 mg of prednisone a day and she has tapered it to10 mg today. She is about 50% improved. Her abdominal pain is better, nausea is much better and she has only occasional diarrhea. She denies rectal bleeding. She also resumed her Humira 40 mg every 2 weeks. She is on 6-MP 25 mg a day and Nexium 40 mg when necessary. She is on a Coumadin because of pulmonary emboli. She has had very aggressive Crohn's disease since I met her when she was 20 years old. Her last CT scan of the abdomen in July 2012 showed a left adnexal cystic mass but there was no evidence of active Crohn's. Despite of that, she has improved on prednisone.   Past Medical History  Diagnosis Date  . 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 Depression   . Anxiety   . SVT (supraventricular tachycardia)   . Candida esophagitis   . Ovarian cyst, left   . Anemia     iron def  . DVT (deep venous thrombosis) 08/2010    Left IJ (postop) anticoag x 3 mo   Past Surgical History  Procedure Date  . Cholecystectomy 2009  . Closure of ileostomy/loop 08/2010  . Colectomy with diverting loop ileostomy   . Appendectomy     reports that she has never smoked. She has never used smokeless tobacco. She reports that she does not drink alcohol or use illicit drugs. family history includes Breast cancer in her maternal grandmother and Diabetes in her cousin and maternal uncle.  There is no history of Colon  cancer. Allergies  Allergen Reactions  . Iron     REACTION: IV iron dextran  . Iron Dextran         Review of Systems: Denies nausea heartburn. Has occasional abdominal pain. Occasional diarrhea. Denies rectal bleeding. Weight has been stable or slightly up  The remainder of the 10  point ROS is negative except as outlined in H&P   Physical Exam: General appearance  Well developed in no distress, appears cushingoid. Eyes- non icteric. HEENT nontraumatic, normocephalic. Mouth no lesions, tongue papillated, no cheilosis. Neck supple without adenopathy, thyroid not enlarged, no carotid bruits, no JVD. Lungs Clear to auscultation bilaterally. Cor normal S1 normal S2, regular rhythm , no murmur,  quiet precordium. Abdomen soft obese abdomen with well-healed surgical scars. Very tender to the right of the vertical incision. No rebound. Bowel sounds are normal active. Suprapubic tenderness. Rectal: Not done. Extremities no pedal edema. Skin no lesions. Neurological alert and oriented x 3. Psychological normal mood and affect.  Assessment and Plan:  Problem #1 Crohn's disease of the small bowel status post takedown of ileostomy. Patient is responding to her prednisone taper. We will increase her Humira to 80 mg every 2 weeks. She was on this dose until last October 2011 before her surgery. It willl allow [place] to taper down the prednisone but for now she will continue on prednisone 10  mg daily. She will go back on her 6 MP to 50 mg daily. We have reduced 6MP temporarily due to alopecia, which is a known side effect of 6MP.. I will see her in 6 weeks. She will be switching insurance starting August 21 and we will then try to pre certify her for Humira.  Problem #2 ovarian cysts. Followed by her gynecologist.  Problem #3 nausea. We will increase her PPIs to every day dosing.  02/12/2011 [name]

## 2011-02-12 NOTE — Patient Instructions (Signed)
We have given you your TB skin test today. Please come back on Friday 02/14/11 after 9:30 am for your TB reading. We have increased your Humira to 80 mg (2 pens) every other week. We will send a new prescription to the pharmacy when you get your new insurance.

## 2011-02-21 ENCOUNTER — Ambulatory Visit (INDEPENDENT_AMBULATORY_CARE_PROVIDER_SITE_OTHER): Payer: Self-pay | Admitting: Internal Medicine

## 2011-02-21 ENCOUNTER — Other Ambulatory Visit: Payer: Self-pay | Admitting: *Deleted

## 2011-02-21 ENCOUNTER — Telehealth: Payer: Self-pay | Admitting: Internal Medicine

## 2011-02-21 ENCOUNTER — Encounter: Payer: Self-pay | Admitting: *Deleted

## 2011-02-21 DIAGNOSIS — K5090 Crohn's disease, unspecified, without complications: Secondary | ICD-10-CM

## 2011-02-21 MED ORDER — MERCAPTOPURINE 50 MG PO TABS: 50.0000 mg | ORAL_TABLET | Freq: Every day | ORAL | Status: DC

## 2011-02-21 MED ORDER — PREDNISONE 5 MG PO TABS: ORAL_TABLET | ORAL | Status: DC

## 2011-02-21 NOTE — Telephone Encounter (Signed)
Spoke with patient and confirmed rx's sent

## 2011-02-21 NOTE — Telephone Encounter (Signed)
Patient needs rx sent to [facility] for Prednisone and 6 MP because she is changing pharmacies. Rx's sent

## 2011-02-21 NOTE — Telephone Encounter (Signed)
Left a message for patient.

## 2011-02-24 ENCOUNTER — Telehealth: Payer: Self-pay | Admitting: *Deleted

## 2011-02-24 LAB — TB SKIN TEST: Induration: 0

## 2011-02-24 MED ORDER — ESOMEPRAZOLE MAGNESIUM 40 MG PO CPDR: 40.0000 mg | DELAYED_RELEASE_CAPSULE | Freq: Every day | ORAL | Status: DC | PRN

## 2011-02-24 NOTE — Telephone Encounter (Signed)
Patient came for TB skin test results. She states she is having problems with constipation and nausea. She had a small bowel movement today that had some BRB on it. She is not sure if the blood is due to straining. She cannot remember when she had a "good bowel movement" She is passing gas. She had tried eating Activia yogurt. She is taking Prednisone 10 mg daily,6 MP 50 mg daily. States she is stressed about getting back in to school. Erling Cruz is giving her a hard time about coming back to school. She is not enrolled at this time because they told her she has to prove she will not get sick this semester. She will let us know if we need to do anything to help her. She also requested a 90 day supply rx for Nexium to send to [facility] to get free from company. Rx given to patient. Please, advise on costipation/nausea.

## 2011-02-24 NOTE — Telephone Encounter (Signed)
Magnesium Oxide pills 567m, OTC, take 2 at hs,

## 2011-02-25 ENCOUNTER — Ambulatory Visit (INDEPENDENT_AMBULATORY_CARE_PROVIDER_SITE_OTHER)
Admission: RE | Admit: 2011-02-25 | Discharge: 2011-02-25 | Disposition: A | Discharge status: Home / Self Care | Payer: Self-pay | Source: Ambulatory Visit | Attending: Internal Medicine | Admitting: Internal Medicine

## 2011-02-25 ENCOUNTER — Telehealth: Payer: Self-pay | Admitting: Internal Medicine

## 2011-02-25 DIAGNOSIS — K5090 Crohn's disease, unspecified, without complications: Secondary | ICD-10-CM

## 2011-02-25 DIAGNOSIS — R109 Unspecified abdominal pain: Secondary | ICD-10-CM

## 2011-02-25 NOTE — Telephone Encounter (Signed)
Spoke with patient and gave her Dr. Nichola Sizer recommendation. She will bring some papers from [facility] for Dr. [name] to sign today.

## 2011-02-25 NOTE — Telephone Encounter (Signed)
Spoke with Karna Christmas the patient's mother. She states patient is depressed today and has expressed suicidal thoughts because she is tired of being sick. Mother states she is crying and has a heating pad on her abdomen. Patient's mother reports the patient did this one other time when she was on Prednisone for a long period of time. Spoke with Dr. [name], Patient to have KUB today , increase Nexium to BID, take pain medication as  Needed and take Ativan as needed. Instructed patient's mother to come now for xray and to increase the Nexium to BID. Patient's mother states she is taking her Ativan and that pain medication "makes her sick." Patient's mother would like Dr. [name] to call patient later and talk with her. Patient's mother understands that if she feels the patient is a danger to herself to take her to [facility].

## 2011-02-25 NOTE — Telephone Encounter (Signed)
Left a message for patient to call me back.

## 2011-02-26 ENCOUNTER — Telehealth: Payer: Self-pay | Admitting: *Deleted

## 2011-02-26 ENCOUNTER — Ambulatory Visit (INDEPENDENT_AMBULATORY_CARE_PROVIDER_SITE_OTHER): Payer: Self-pay | Admitting: *Deleted

## 2011-02-26 DIAGNOSIS — I82C19 Acute embolism and thrombosis of unspecified internal jugular vein: Secondary | ICD-10-CM

## 2011-02-26 LAB — POCT INR: INR: 2.8

## 2011-02-26 MED ORDER — BUTALBITAL-APAP-CAFFEINE 50-325-40 MG PO TABS: ORAL_TABLET | ORAL | Status: DC

## 2011-02-26 NOTE — Telephone Encounter (Signed)
Fioricet 1-2 po q4 hrs prn pain,

## 2011-02-26 NOTE — Telephone Encounter (Signed)
Patient states she was given hydrocodone yesterday for pain, however this causes her to vomit. Dr [name] would you like to give her an alternative pain medication/

## 2011-02-26 NOTE — Telephone Encounter (Signed)
Patient notified of rx and she requests the rx go to [facility]. She is going to [facility] at 3:00 PM today and she will tell them we have put her on Fioricet.

## 2011-02-26 NOTE — Telephone Encounter (Signed)
I have spoken to [name] and to [name], [place]. Scientist, physiological of Students at [employer],  I explained that [name] is fit to go back to school full time.She was goigt to call [employer] and discuss an appeal letter.

## 2011-02-28 ENCOUNTER — Other Ambulatory Visit: Payer: Self-pay

## 2011-02-28 ENCOUNTER — Telehealth: Payer: Self-pay | Admitting: *Deleted

## 2011-02-28 NOTE — Telephone Encounter (Signed)
Message copied by Hulan Saas on Fri Feb 28, 2011  9:47 AM ------      Message from: Lafayette Dragon      Created: Thu Feb 27, 2011 10:56 PM       Please call pt with KUB results, soft tissue density in the pelvis could be constipation,or full bladder, could she be pregnant. Would get a urine  pregnancy test if she denies being constipated.

## 2011-02-28 NOTE — Telephone Encounter (Signed)
Spoke with patient and gave her the results. She has been constipated but she wishes to have a pregnancy test to be sure. Patient will come today.

## 2011-03-03 ENCOUNTER — Telehealth: Payer: Self-pay | Admitting: *Deleted

## 2011-03-03 NOTE — Telephone Encounter (Signed)
Message copied by Hulan Saas on Mon Mar 03, 2011  1:08 PM ------      Message from: Lafayette Dragon      Created: Mon Mar 03, 2011 12:56 PM       Please call pt with negative results

## 2011-03-03 NOTE — Telephone Encounter (Signed)
Patient given results as per Dr. Brodie 

## 2011-03-12 ENCOUNTER — Ambulatory Visit (INDEPENDENT_AMBULATORY_CARE_PROVIDER_SITE_OTHER): Payer: Self-pay | Admitting: *Deleted

## 2011-03-12 ENCOUNTER — Telehealth: Payer: Self-pay | Admitting: Internal Medicine

## 2011-03-12 DIAGNOSIS — I82C19 Acute embolism and thrombosis of unspecified internal jugular vein: Secondary | ICD-10-CM

## 2011-03-12 NOTE — Telephone Encounter (Signed)
A user error has taken place: encounter opened in error, closed for administrative reasons.

## 2011-03-13 ENCOUNTER — Telehealth: Payer: Self-pay | Admitting: Pharmacist

## 2011-03-13 NOTE — Telephone Encounter (Signed)
Spoke with patient and told her that per Dr. Asa Lente, it is ok to stop coumadin and Coumadin clinic follow-ups.

## 2011-03-13 NOTE — Telephone Encounter (Signed)
Message copied by Woodroe Chen on Thu Mar 13, 2011  2:28 PM ------      Message from: Gwendolyn Grant A      Created: Wed Mar 12, 2011  4:51 PM      Regarding: RE: Discontinuation of Coumadin?       Yes - 6 mo tx appropriate - ok to stop anticoag at this time - thanks!            ----- Message -----         From: Kirke Shaggy, PHARMD         Sent: 03/12/2011   4:12 PM           To: Gwendolyn Grant, MD      Subject: Discontinuation of Coumadin?                             Dr. Asa Lente,            We saw Caria Transue (1990-11-22) in the [facility]in Clinic today. She said she was told that she could stop her coumadin today if her INR was within range today. Porfirio Oar is out of the clinic until next week, so I'm not sure if this decision was communicated to her, but I wanted to confirm with you before I told her that she could stop. I told her I would follow-up with you and give her a call tomorrow. Please let me know what you would like me to communicate to the patient.            Thanks,             Woodroe Chen, PharmD      Tillie Rung. [facility]       PGY1 Ambulatory Care Resident

## 2011-03-18 ENCOUNTER — Telehealth: Payer: Self-pay | Admitting: *Deleted

## 2011-03-18 NOTE — Telephone Encounter (Signed)
Spoke to patient and she states that she is in school now but has not heard anything about her health insurance. She states that she will let us know when she does hear something so we can send her Humira refills to the appropriate place. Patient also states that she has been having difficulty with gas which causes abdominal discomfort and she is unable to "pass the gas." She states that she has been taking Gas-X without much relief. I have advised her to continue gas x as instructed on package instructions and I have also placed Align (4 boxes) at the front desk for her to try. I have also asked that she avoid gas producing foods. She verbalizes understanding and will call us back if the Align, Gas-X and diet do not help.

## 2011-03-18 NOTE — Telephone Encounter (Signed)
Reviewed and agree.

## 2011-03-20 ENCOUNTER — Other Ambulatory Visit: Payer: Self-pay | Admitting: *Deleted

## 2011-03-20 MED ORDER — LORAZEPAM 1 MG PO TABS: 1.0000 mg | ORAL_TABLET | Freq: Three times a day (TID) | ORAL | Status: DC | PRN

## 2011-03-27 ENCOUNTER — Telehealth: Payer: Self-pay | Admitting: Internal Medicine

## 2011-03-27 MED ORDER — PROMETHAZINE HCL 25 MG PO TABS: 25.0000 mg | ORAL_TABLET | ORAL | Status: DC | PRN

## 2011-03-27 NOTE — Telephone Encounter (Signed)
rx sent to pharmacy

## 2011-04-09 ENCOUNTER — Telehealth: Payer: Self-pay | Admitting: Internal Medicine

## 2011-04-09 NOTE — Telephone Encounter (Signed)
Spoke with patient. She wants to let Carla Drape know she still has not gotten her insurance card from school. Also, she states that papers were to be sent to [facility] for her 90 rx but they say they have not seen them.

## 2011-04-10 LAB — CBC
MCHC: 32.9
MCV: 89.3
Platelets: 315
RDW: 14.1

## 2011-04-10 LAB — DIFFERENTIAL
Basophils Relative: 0
Lymphs Abs: 0.7
Monocytes Relative: 4
Neutro Abs: 10.6 — ABNORMAL HIGH
Neutrophils Relative %: 89 — ABNORMAL HIGH

## 2011-04-10 LAB — BASIC METABOLIC PANEL
BUN: 6
CO2: 25
Chloride: 108
Creatinine, Ser: 0.64
Glucose, Bld: 143 — ABNORMAL HIGH

## 2011-04-10 LAB — URINE MICROSCOPIC-ADD ON

## 2011-04-10 LAB — URINALYSIS, ROUTINE W REFLEX MICROSCOPIC
Nitrite: NEGATIVE
Specific Gravity, Urine: 1.026
Urobilinogen, UA: 1
pH: 5.5

## 2011-04-10 LAB — PREGNANCY, URINE: Preg Test, Ur: NEGATIVE

## 2011-04-10 NOTE — Telephone Encounter (Signed)
Left message for patient to call back  

## 2011-04-11 LAB — HEPATIC FUNCTION PANEL
ALT: 36 — ABNORMAL HIGH
ALT: 37 — ABNORMAL HIGH
AST: 27
Albumin: 3.1 — ABNORMAL LOW
Albumin: 3.4 — ABNORMAL LOW
Alkaline Phosphatase: 48
Total Bilirubin: 0.4
Total Protein: 6.3

## 2011-04-11 LAB — URINALYSIS, ROUTINE W REFLEX MICROSCOPIC
Hgb urine dipstick: NEGATIVE
Protein, ur: NEGATIVE
Specific Gravity, Urine: 1.021
Urobilinogen, UA: 1

## 2011-04-11 LAB — DIFFERENTIAL
Basophils Absolute: 0
Basophils Absolute: 0.1
Basophils Relative: 0
Basophils Relative: 1
Eosinophils Absolute: 0
Eosinophils Relative: 0
Lymphocytes Relative: 35
Monocytes Absolute: 0.5
Monocytes Relative: 7
Monocytes Relative: 8
Neutro Abs: 2.5
Neutro Abs: 5.9
Neutrophils Relative %: 56

## 2011-04-11 LAB — CBC
HCT: 38.3
Hemoglobin: 11.6 — ABNORMAL LOW
MCHC: 33.4
Platelets: 299
RBC: 3.82 — ABNORMAL LOW
RDW: 14.2
WBC: 7.3

## 2011-04-11 LAB — COMPREHENSIVE METABOLIC PANEL
ALT: 41 — ABNORMAL HIGH
AST: 69 — ABNORMAL HIGH
Albumin: 3.7
Alkaline Phosphatase: 63
BUN: 8
Chloride: 110
GFR calc Af Amer: >60
Potassium: 3.9
Sodium: 141
Total Bilirubin: 0.9
Total Protein: 6.7

## 2011-04-11 LAB — GC/CHLAMYDIA PROBE AMP, GENITAL: Chlamydia, DNA Probe: NEGATIVE

## 2011-04-11 LAB — POCT PREGNANCY, URINE: Preg Test, Ur: NEGATIVE

## 2011-04-11 LAB — AMYLASE: Amylase: 84

## 2011-04-11 LAB — WET PREP, GENITAL: Clue Cells Wet Prep HPF POC: NONE SEEN

## 2011-04-11 NOTE — Telephone Encounter (Signed)
Left message for patient to call back  

## 2011-04-14 NOTE — Telephone Encounter (Signed)
Please stay on Humira 40 mg IM q 2 weeks. May be she needs for [place] to call her insurance to obtain her member # so we can notify her Pharmacy, so she can get her Humira.

## 2011-04-14 NOTE — Telephone Encounter (Signed)
Patient states that she has been told that the Nexium script and info we sent for her while she was at the office was not gotten. She states that she has explained that she watched [place] send in the information but they are still unable to locate it. Patient states that she will come by the office so I can give her a new script etc. (she will call me when she is ready to come by). Patient also states that she still has not gotten her insurance card so she cannot get Humira. I have explained that she should be able to contact her insurance company to at least get her member ID number that she can verbally give to the pharmacy. Otherwise, she may be out of Humira before she ever gets her card (she has been waiting several months for it). Patient states that she was supposed to increase to Humira 80 mg every 2 weeks, but has stayed on Humira 40 mg every 2 weeks since she wasn't sure how long it would be before she was able to get insurance originally. She states that she is now doing well on the Humira 40 mg. Dr [name], do you want me to go ahead and send a script for her to get 80 mg every 2 weeks or do you want me to keep her at Humira 40 mg every 2 weeks since she is now doing well?

## 2011-04-15 LAB — URINALYSIS, ROUTINE W REFLEX MICROSCOPIC
Glucose, UA: NEGATIVE
Hgb urine dipstick: NEGATIVE
Ketones, ur: 15 — AB
pH: 5.5

## 2011-04-15 LAB — COMPREHENSIVE METABOLIC PANEL
ALT: 16
AST: 18
Albumin: 4
Chloride: 104
Creatinine, Ser: 0.71
GFR calc Af Amer: >60
Potassium: 3.6
Sodium: 140
Total Bilirubin: 0.7

## 2011-04-15 LAB — DIFFERENTIAL
Basophils Absolute: 0
Eosinophils Relative: 0
Lymphocytes Relative: 37
Monocytes Absolute: 0.6

## 2011-04-15 LAB — CBC
MCV: 90.2
Platelets: 359
WBC: 7.6

## 2011-04-15 LAB — URINE MICROSCOPIC-ADD ON

## 2011-04-15 LAB — POCT PREGNANCY, URINE: Preg Test, Ur: NEGATIVE

## 2011-04-15 MED ORDER — ADALIMUMAB 40 MG/0.8ML ~~LOC~~ KIT: 40.0000 mg | PACK | SUBCUTANEOUS | Status: DC

## 2011-04-15 MED ORDER — ESOMEPRAZOLE MAGNESIUM 40 MG PO CPDR: 40.0000 mg | DELAYED_RELEASE_CAPSULE | Freq: Every day | ORAL | Status: DC

## 2011-04-15 NOTE — Telephone Encounter (Signed)
Left message to call back  

## 2011-04-15 NOTE — Telephone Encounter (Signed)
Patient advised that she may continue Humira 40 mg every 2 weeks as long as she is doing well. I have sent rx to [facility] as per patient's request. She also requests a 90 day script for Nexium as the previous rx we sent apparently did not go through. Rx created.

## 2011-04-29 IMAGING — CT CT ANGIO CHEST
2 of 6 series · 19 of 36 positions shown · IV contrast (Omnipaque 300)
Comparison: 07/29/2010

CLINICAL DATA: Chest pain.  Left IJ thrombosis.  Anticoagulated.
Shortness of breath.

CT ANGIOGRAPHY CHEST WITH CONTRAST
TECHNIQUE: Multidetector CT imaging of the chest was performed
using the standard protocol during bolus administration of
intravenous contrast.  Multiplanar CT image reconstructions
including MIPs were obtained to evaluate the vascular anatomy.
Contrast:  80 ml Omnipaque 350 IV

[Series 6: thins (id) / (id) · axial · 0.68mm/px · z∈[-229,-38]mm · 18 of 213 slices shown]
[im 11/213  lung]
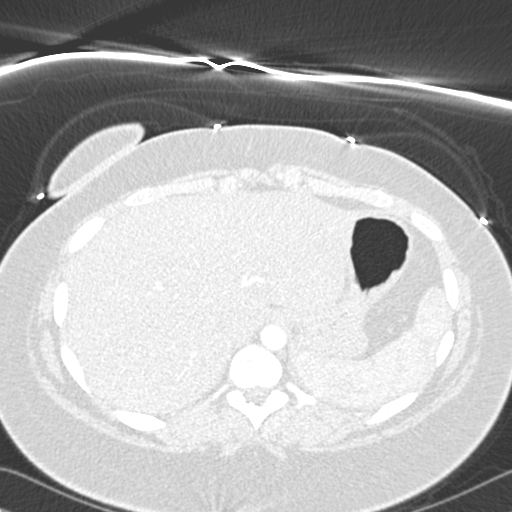
[im 22/213  mediastinal]
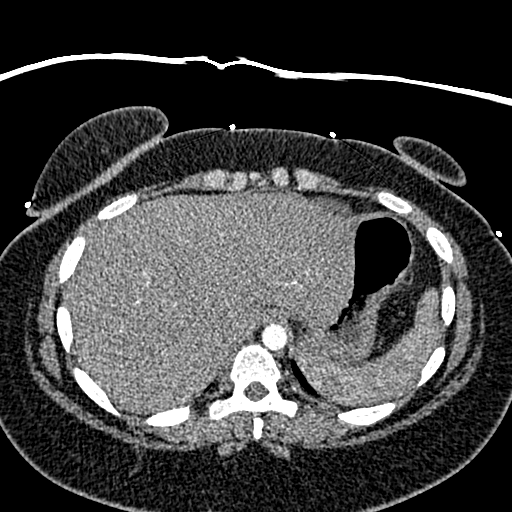
[im 32/213  lung]
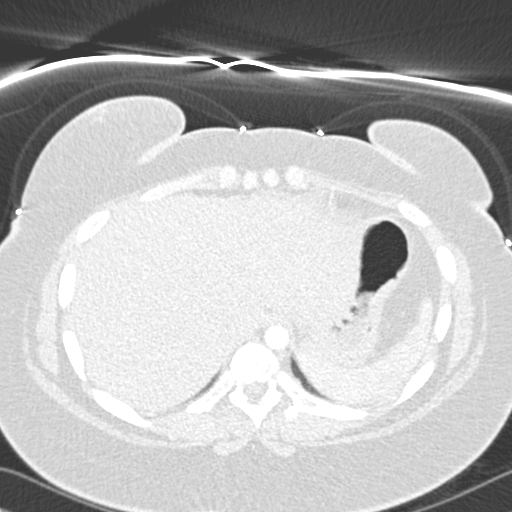
[im 43/213  mediastinal]
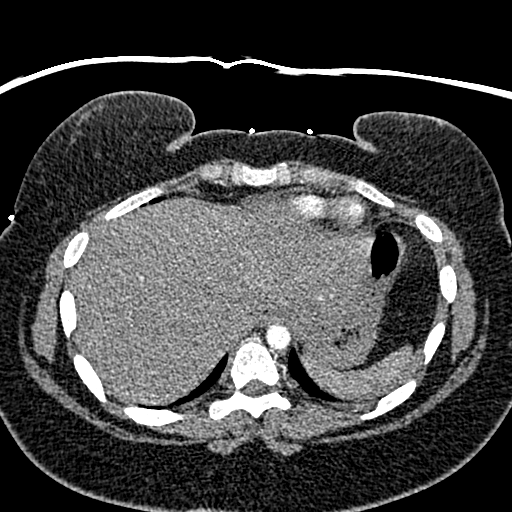
[im 54/213  lung]
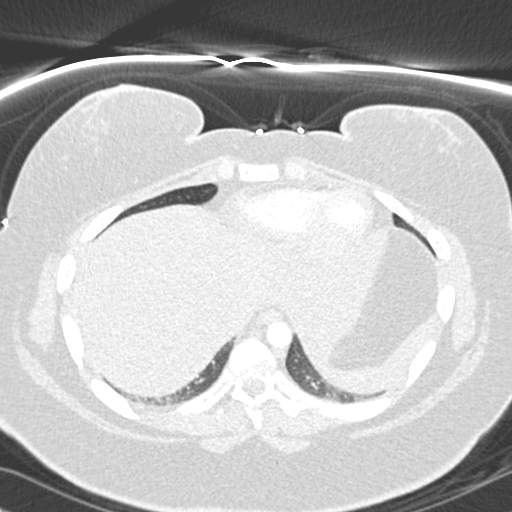
[im 64/213  mediastinal]
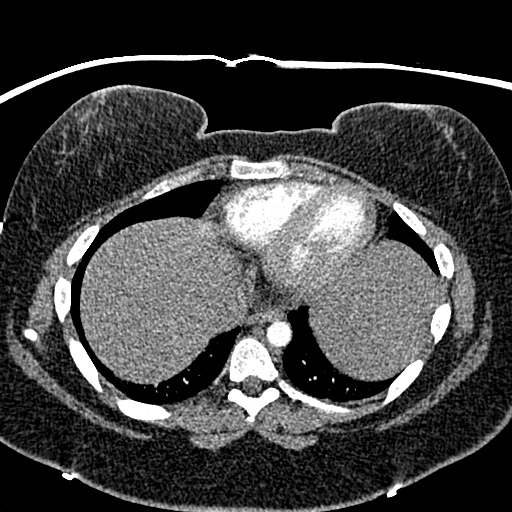
[im 75/213  lung]
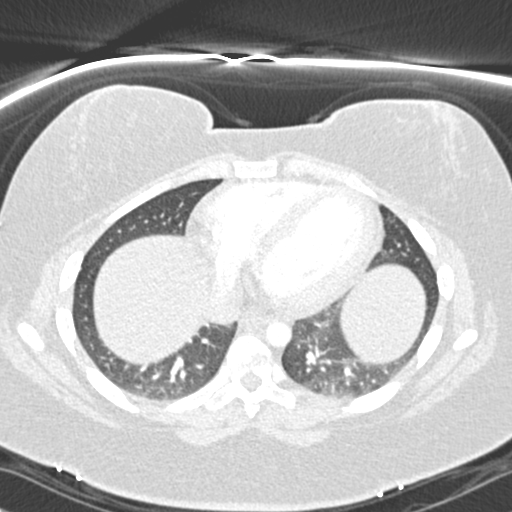
[im 85/213  mediastinal]
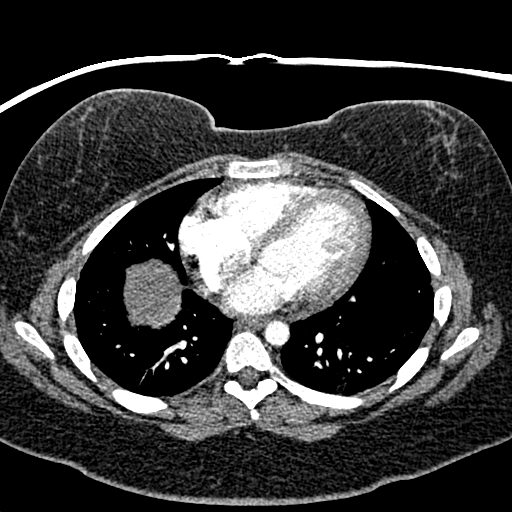
[im 96/213  lung]
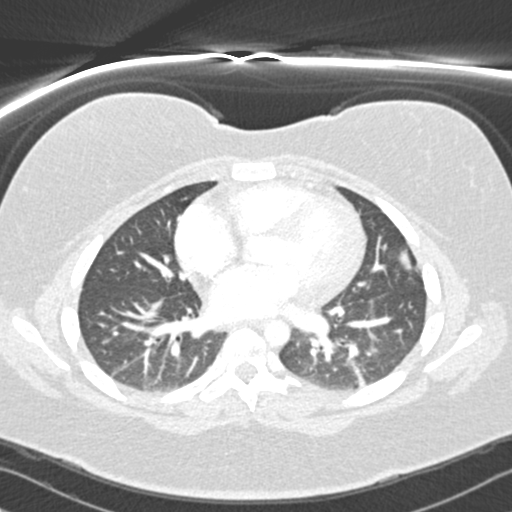
[im 117/213  mediastinal]
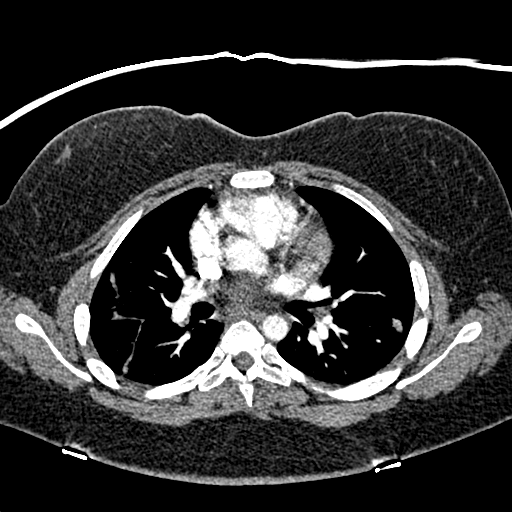
[im 128/213  lung]
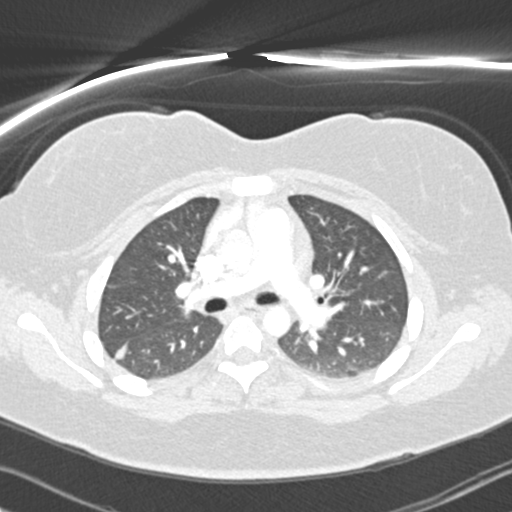
[im 138/213  mediastinal]
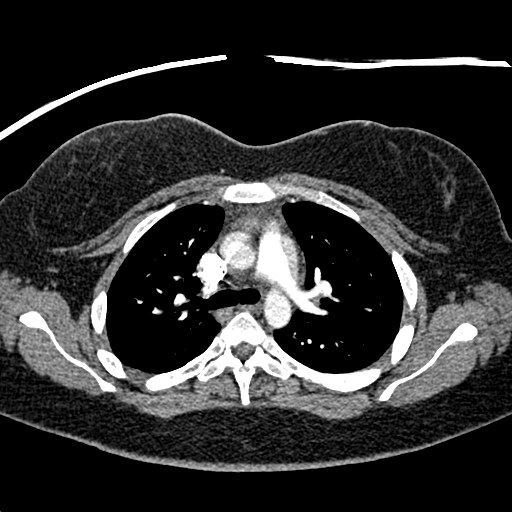
[im 149/213  lung]
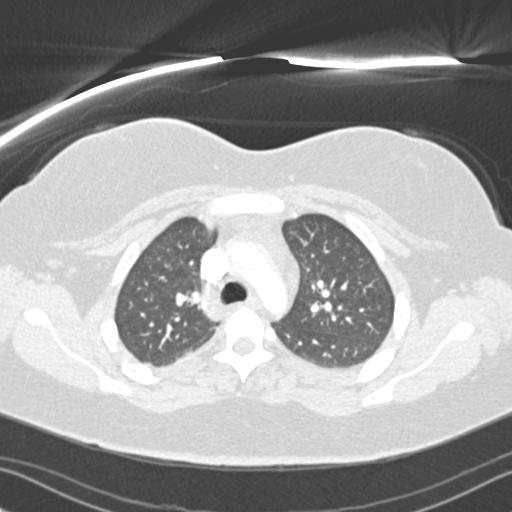
[im 160/213  mediastinal]
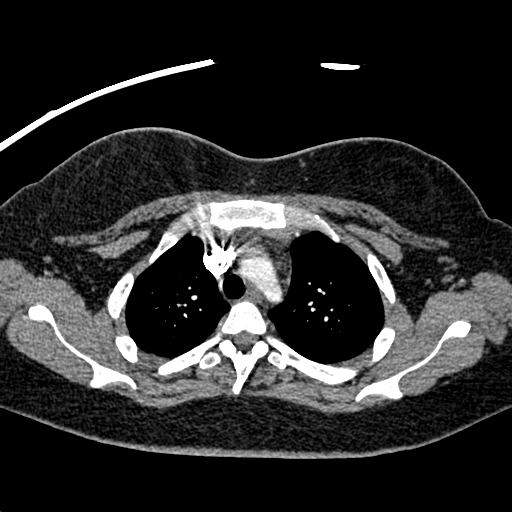
[im 170/213  lung]
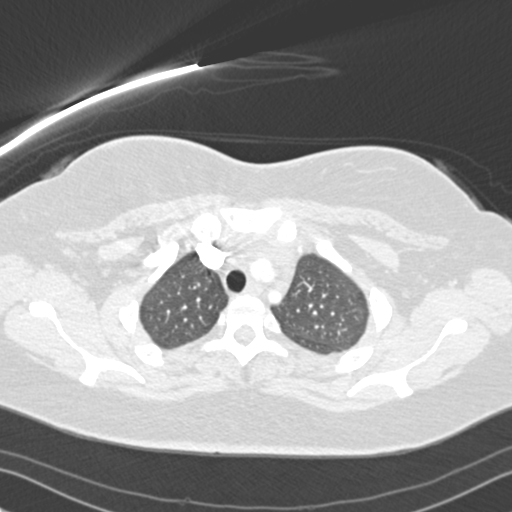
[im 181/213  mediastinal]
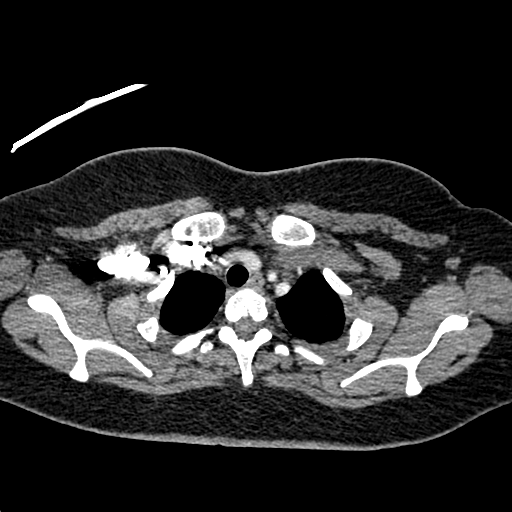
[im 191/213  lung]
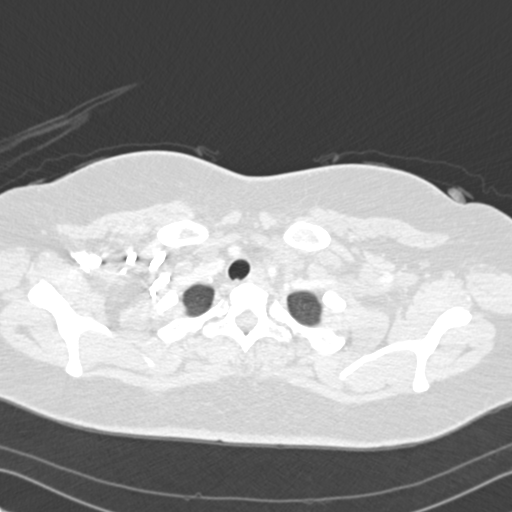
[im 202/213  mediastinal]
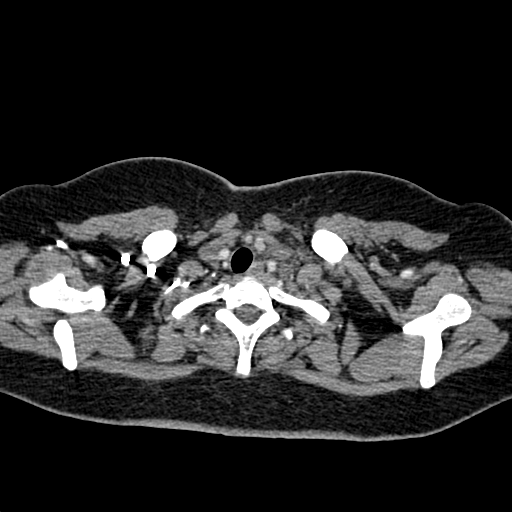

[Series 602: cor mpr · coronal · 0.68mm/px · 1 of 85 slices shown]
[im 43/85  mediastinal]
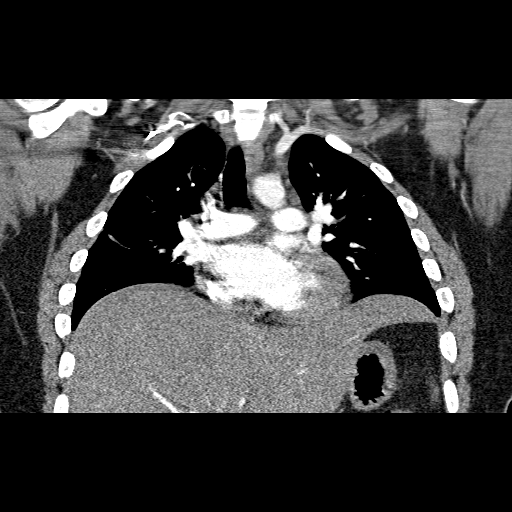

[19 of 36 positions shown; findings below may reference images not displayed]

FINDINGS: There is fairly good contrast opacification of pulmonary
artery branches with no discrete filling defects to suggest acute
PE.

There is fairly good contrast opacification of the thoracic aorta,
without dissection, aneurysm, or stenosis.  There is bovine arch
anatomy, an anatomic variant.

No pleural or pericardial effusion.  Sub centimeter prevascular and
anterior mediastinal lymph nodes.  No hilar adenopathy.

Visualized portions of upper abdomen unremarkable.

There is linear scarring or plate-like atelectasis in the right
middle lobe, superior segment right lower lobe, lingula, and left
lower lobe.  No confluent airspace consolidation.

Interval resolution  of the small left effusion is seen on the
prior study.  There is improved aeration of the left lower lobe.

Regional bones unremarkable.

Review of the MIP images confirms the above findings.
IMPRESSION: 1.  Negative for acute PE or thoracic aortic dissection.
2.  Bilateral linear scarring or atelectasis.

## 2011-05-06 ENCOUNTER — Telehealth: Payer: Self-pay | Admitting: Internal Medicine

## 2011-05-07 NOTE — Telephone Encounter (Signed)
Left message for [name] to call back.

## 2011-05-07 NOTE — Telephone Encounter (Signed)
I have spoken to [name] to confirm patient's date of birth and rx sig.

## 2011-06-25 ENCOUNTER — Telehealth: Payer: Self-pay | Admitting: Internal Medicine

## 2011-06-25 NOTE — Telephone Encounter (Signed)
Spoke with patient told her Dr. [name] would want her to get a flu shot. She is sick now with cough and cold but will call back and schedule for one.

## 2011-06-26 ENCOUNTER — Ambulatory Visit: Payer: Self-pay | Admitting: Internal Medicine

## 2011-06-27 ENCOUNTER — Ambulatory Visit (INDEPENDENT_AMBULATORY_CARE_PROVIDER_SITE_OTHER): Payer: Self-pay | Admitting: Internal Medicine

## 2011-06-27 ENCOUNTER — Ambulatory Visit (INDEPENDENT_AMBULATORY_CARE_PROVIDER_SITE_OTHER)
Admission: RE | Admit: 2011-06-27 | Discharge: 2011-06-27 | Disposition: A | Discharge status: Home / Self Care | Payer: Self-pay | Source: Ambulatory Visit | Attending: Internal Medicine | Admitting: Internal Medicine

## 2011-06-27 ENCOUNTER — Encounter: Payer: Self-pay | Admitting: Internal Medicine

## 2011-06-27 VITALS — BP 132/84 | HR 107 | Temp 97.9°F | Ht 64.0 in | Wt 218.0 lb

## 2011-06-27 DIAGNOSIS — J209 Acute bronchitis, unspecified: Secondary | ICD-10-CM | POA: Insufficient documentation

## 2011-06-27 DIAGNOSIS — R062 Wheezing: Secondary | ICD-10-CM | POA: Insufficient documentation

## 2011-06-27 DIAGNOSIS — J019 Acute sinusitis, unspecified: Secondary | ICD-10-CM | POA: Insufficient documentation

## 2011-06-27 MED ORDER — HYDROCODONE-HOMATROPINE 5-1.5 MG/5ML PO SYRP: 5.0000 mL | ORAL_SOLUTION | Freq: Four times a day (QID) | ORAL | Status: AC | PRN

## 2011-06-27 MED ORDER — ALBUTEROL SULFATE HFA 108 (90 BASE) MCG/ACT IN AERS: 2.0000 | INHALATION_SPRAY | Freq: Four times a day (QID) | RESPIRATORY_TRACT | Status: DC | PRN

## 2011-06-27 MED ORDER — LEVOFLOXACIN 500 MG PO TABS: 500.0000 mg | ORAL_TABLET | Freq: Every day | ORAL | Status: AC

## 2011-06-27 MED ORDER — PREDNISONE 10 MG PO TABS: 10.0000 mg | ORAL_TABLET | Freq: Every day | ORAL | Status: DC

## 2011-06-27 NOTE — Patient Instructions (Addendum)
Take all new medications as prescribed Continue all other medications as before Please go to XRAY in the Basement for the x-ray test Please call the phone number [phone] (the [place]) for results of testing in 2-3 days;  When calling, simply dial the number, and when prompted enter the MRN number above (the Medical Record Number) and the # key, then the message should start.

## 2011-06-28 ENCOUNTER — Encounter: Payer: Self-pay | Admitting: Internal Medicine

## 2011-06-28 NOTE — Assessment & Plan Note (Signed)
Mild to mod, for antibx course,  to f/u any worsening symptoms or concerns 

## 2011-06-28 NOTE — Assessment & Plan Note (Signed)
Mild to mod, for antibx course,  to f/u any worsening symptoms or concerns, cant f/u pna - for cxr today

## 2011-06-28 NOTE — Progress Notes (Signed)
Subjective:    Patient ID: [name], female    DOB: 05/07/1991, 20 y.o.   MRN: [id number]  HPI   Here with 3 days acute onset fever, facial pain, pressure, general weakness and malaise, and greenish d/c, with slight ST. Also Here with acute onset mild to mod 2-3 days ST, HA, general weakness and malaise, with prod cough greenish sputum, but Pt denies chest pain, increased sob or doe, wheezing, orthopnea, PND, increased LE swelling, palpitations, dizziness or syncope except for onset wheezing/ mild sob today. .Pt denies new neurological symptoms such as new headache, or facial or extremity weakness or numbness   Pt denies polydipsia, polyuria. Not pregnant, LMP 3 days ago Past Medical History  Diagnosis Date  . 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 Depression   . Anxiety   . SVT (supraventricular tachycardia)   . Candida esophagitis   . Ovarian cyst, left   . Anemia     iron def  . DVT (deep venous thrombosis) 08/2010    Left IJ (postop) anticoag x 3 mo   Past Surgical History  Procedure Date  . Cholecystectomy 2009  . Closure of ileostomy/loop 08/2010  . Colectomy with diverting loop ileostomy   . Appendectomy     reports that she has never smoked. She has never used smokeless tobacco. She reports that she does not drink alcohol or use illicit drugs. family history includes Breast cancer in her maternal grandmother and Diabetes in her cousin and maternal uncle.  There is no history of Colon cancer. Allergies  Allergen Reactions  . Iron     REACTION: IV iron dextran  . Iron Dextran    Current Outpatient Prescriptions on File Prior to Visit  Medication Sig Dispense Refill  . adalimumab (HUMIRA) 40 MG/0.8ML injection Inject 0.8 mLs (40 mg total) into the skin every 14 (fourteen) days. PLEASE GIVE PENS!!!  2 each  5  . butalbital-acetaminophen-caffeine (FIORICET) 50-325-40 MG per tablet Take 1-2 tabs every 4 hours prn pain  60  tablet  0  . dicyclomine (BENTYL) 10 MG capsule Take one po TID prn abdominal cramps  30 capsule  0  . esomeprazole (NEXIUM) 40 MG capsule Take 1 capsule (40 mg total) by mouth daily before breakfast.  90 capsule  3  . LORazepam (ATIVAN) 1 MG tablet Take 1 tablet (1 mg total) by mouth 3 (three) times daily as needed for anxiety.  30 tablet  1  . mercaptopurine (PURINETHOL) 50 MG tablet Take 1 tablet (50 mg total) by mouth daily.  30 tablet  0  . predniSONE (DELTASONE) 5 MG tablet Take as directed.  40 tablet  0  . promethazine (PHENERGAN) 25 MG tablet Take 1 tablet (25 mg total) by mouth every 4 (four) hours as needed. Take 1 tablet by mouth every 4-6 hours as needed for nausea  30 tablet  1  . warfarin (COUMADIN) 10 MG tablet Take as directed by Anticoagulation clinic   90 tablet  1    Review of Systems Review of Systems  Constitutional: Negative for diaphoresis and unexpected weight change.  HENT: Negative for drooling and tinnitus.   Eyes: Negative for photophobia and visual disturbance.  Respiratory: Negative for choking and stridor.   Gastrointestinal: Negative for vomiting and blood in stool.  Genitourinary: Negative for hematuria and decreased urine volume.      Objective:   Physical Exam BP 132/84  Pulse 107  Temp(Src) 97.9  F (36.6 C) (Oral)  Ht 5' 4"  (1.626 m)  Wt 218 lb (98.884 kg)  BMI 37.42 kg/m2  SpO2 95%  LMP 06/20/2011 Physical Exam  VS noted, mild ill Constitutional: Pt appears well-developed and well-nourished.  HENT: Head: Normocephalic.  Right Ear: External ear normal.  Left Ear: External ear normal.  Bilat tm's mild erythema.  Sinus tender bilat.  Pharynx mild erythema Eyes: Conjunctivae and EOM are normal. Pupils are equal, round, and reactive to light.  Neck: Normal range of motion. Neck supple.  Cardiovascular: Normal rate and regular rhythm.   Pulmonary/Chest: Effort normal and breath sounds decreased with few mild wheezes.  Abd:  Soft, NT,  non-distended, + BS Neurological: Pt is alert. No cranial nerve deficit.  Skin: Skin is warm. No erythema.  Psychiatric: Pt behavior is normal. Thought content normal.         Assessment & Plan:

## 2011-06-28 NOTE — Assessment & Plan Note (Signed)
Mild, for cough med, predpack asd, inhaler prn,  to f/u any worsening symptoms or concerns  SpO2 Readings from Last 3 Encounters:  06/27/11 95%  11/19/10 98%  09/06/10 93%

## 2011-07-15 HISTORY — PX: COLONOSCOPY: SHX174

## 2011-07-17 ENCOUNTER — Telehealth: Payer: Self-pay

## 2011-07-17 NOTE — Telephone Encounter (Signed)
Pt called stating that her toes on both feet have been numb for the last 3 days. Pt has not noticed any other sxs and the numbness does not change with position or activity. Pt is concerned and is requesting advisement from MD.

## 2011-07-17 NOTE — Telephone Encounter (Signed)
Hard to say just from this information, but at least I doubt it is related to her medication  Stress, vitamin deficiency (b12), and nerve damage for other reasons could be possible  If not improved in just a day or two, pt should consider OV for evalaution for ? neuropathy

## 2011-07-18 NOTE — Telephone Encounter (Signed)
Called left message to call back 

## 2011-07-18 NOTE — Telephone Encounter (Signed)
Patient informed. 

## 2011-08-14 ENCOUNTER — Other Ambulatory Visit: Payer: Self-pay | Admitting: Internal Medicine

## 2011-08-14 MED ORDER — LORAZEPAM 1 MG PO TABS: 1.0000 mg | ORAL_TABLET | Freq: Three times a day (TID) | ORAL | Status: DC | PRN

## 2011-08-14 NOTE — Telephone Encounter (Signed)
Rx has been sent to patient's pharmacy. She needs office visit for further refills.

## 2011-08-22 ENCOUNTER — Telehealth: Payer: Self-pay | Admitting: Internal Medicine

## 2011-08-22 NOTE — Telephone Encounter (Signed)
Dr [name]- Patient would like you to know that she was able to get a job!!! However, she also wants you to know that she is unable to get 6MP at this time and is not sure when she can get it due to insurance coverage. Is there any alternative medication you would like her to try?

## 2011-08-23 NOTE — Telephone Encounter (Signed)
Not right now. Prednisone would be a substitute but not if she is doing OK. I have some samples of Entecort but they will not last but few weeks so I would hold it unless she gets sick. She ought to have an OV soon.

## 2011-08-25 NOTE — Telephone Encounter (Signed)
Left voicemail for patient to call back. 

## 2011-08-25 NOTE — Telephone Encounter (Signed)
Advised patient of Dr Nichola Sizer recommendations. She has scheduled an appointment for Friday.

## 2011-08-27 ENCOUNTER — Encounter: Payer: Self-pay | Admitting: Internal Medicine

## 2011-08-27 ENCOUNTER — Ambulatory Visit (INDEPENDENT_AMBULATORY_CARE_PROVIDER_SITE_OTHER): Payer: Self-pay | Admitting: Internal Medicine

## 2011-08-27 ENCOUNTER — Other Ambulatory Visit (INDEPENDENT_AMBULATORY_CARE_PROVIDER_SITE_OTHER): Payer: Self-pay

## 2011-08-27 VITALS — BP 118/70 | HR 76 | Ht 64.0 in | Wt 224.4 lb

## 2011-08-27 DIAGNOSIS — Z79899 Other long term (current) drug therapy: Secondary | ICD-10-CM

## 2011-08-27 DIAGNOSIS — K5090 Crohn's disease, unspecified, without complications: Secondary | ICD-10-CM

## 2011-08-27 LAB — COMPREHENSIVE METABOLIC PANEL
Alkaline Phosphatase: 48 U/L (ref 39–117)
BUN: 11 mg/dL (ref 6–23)
CO2: 27 mEq/L (ref 19–32)
Creatinine, Ser: 0.6 mg/dL (ref 0.4–1.2)
GFR: 159.95 mL/min (ref >60.00)
Glucose, Bld: 107 mg/dL — ABNORMAL HIGH (ref 70–99)
Sodium: 137 mEq/L (ref 135–145)
Total Bilirubin: 0.4 mg/dL (ref 0.3–1.2)
Total Protein: 7.1 g/dL (ref 6.0–8.3)

## 2011-08-27 LAB — CBC WITH DIFFERENTIAL/PLATELET
Eosinophils Relative: 4.2 % (ref 0.0–5.0)
HCT: 35.7 % — ABNORMAL LOW (ref 36.0–46.0)
Hemoglobin: 11.3 g/dL — ABNORMAL LOW (ref 12.0–15.0)
Lymphs Abs: 2.2 10*3/uL (ref 0.7–4.0)
MCV: 82.7 fl (ref 78.0–100.0)
Monocytes Absolute: 0.4 10*3/uL (ref 0.1–1.0)
Monocytes Relative: 5.2 % (ref 3.0–12.0)
Neutro Abs: 5 10*3/uL (ref 1.4–7.7)
Platelets: 436 10*3/uL — ABNORMAL HIGH (ref 150.0–400.0)
WBC: 7.9 10*3/uL (ref 4.5–10.5)

## 2011-08-27 LAB — IBC PANEL
Iron: 33 ug/dL — ABNORMAL LOW (ref 42–145)
Transferrin: 348.9 mg/dL (ref 212.0–360.0)

## 2011-08-27 MED ORDER — PROMETHAZINE HCL 25 MG PO TABS: 25.0000 mg | ORAL_TABLET | ORAL | Status: DC | PRN

## 2011-08-27 NOTE — Progress Notes (Signed)
[name] 03/23/1991 MRN [id number]    History of Present Illness:  This is a 21 year old African American female with Crohn's disease of the duodenum, terminal ileum and colon. She had a ileal cecectomy and diverting ileostomy followed by takedown of the ileostomy in January 2012. She is a Paramedic at [employer]. She is taking a year off and will be starting a new job next week. She has done very well since her last visit in August 2012. She denies abdominal pain, diarrhea or rectal bleeding. She has finished her prednisone taper. She remains on Humira 40 mg every 2 weeks and 6 MP 50 mg daily. She also takes Nexium 40 mg as needed but has not taken it lately. She has a history of pulmonary emboli for which she was on Coumadin for 6 months but is no longer anticoagulated. She takes Ativan as needed.   Past Medical History  Diagnosis Date  . 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 Depression   . Anxiety   . SVT (supraventricular tachycardia)   . Candida esophagitis   . Ovarian cyst, left   . Anemia     iron def  . DVT (deep venous thrombosis) 08/2010    Left IJ (postop) anticoag x 3 mo   Past Surgical History  Procedure Date  . Cholecystectomy 2009  . Closure of ileostomy/loop 08/2010  . Colectomy with diverting loop ileostomy   . Appendectomy     reports that she has never smoked. She has never used smokeless tobacco. She reports that she does not drink alcohol or use illicit drugs. family history includes Breast cancer in her maternal grandmother and Diabetes in her cousin and maternal uncle.  There is no history of Colon cancer. Allergies  Allergen Reactions  . Iron     REACTION: IV iron dextran  . Iron Dextran         Review of Systems: Denies heartburn, chest pain, shortness of breath, abdominal pain or diarrhea  The remainder of the 10 point ROS is negative except as outlined in H&P   Physical Exam: General appearance  Well  developed, in no distress., Overweight Eyes- non icteric. HEENT nontraumatic, normocephalic. Mouth no lesions, tongue papillated, no cheilosis. Neck supple without adenopathy, thyroid not enlarged, no carotid bruits, no JVD. Lungs Clear to auscultation bilaterally. Cor normal S1, normal S2, regular rhythm, no murmur,  quiet precordium. Abdomen: Soft with minimal tenderness in epigastrium. Normal active bowel sounds. No distention. Rectal: Not done Extremities no pedal edema. Skin no lesions. Neurological alert and oriented x 3. Psychological normal mood and affect.  Assessment and Plan:  Problem #1 Crohn's disease of the small bowel and duodenum is in remission. She is on medical therapy consisting of Humira, 6-MP and Nexium. She is currently doing very well. We will check her labs today. She will return in 3 months for follow up.   08/27/2011 [name]

## 2011-08-27 NOTE — Patient Instructions (Signed)
We have sent the following medications to your pharmacy for you to pick up at your convenience: [place] has requested that you go to the basement for the following lab work before leaving today: CBC, CMET, Sed rate, IBC panel, B12 level Please follow up with Dr [name] in 3 months. CC: Dr Asa Lente

## 2011-08-28 ENCOUNTER — Telehealth: Payer: Self-pay | Admitting: *Deleted

## 2011-08-28 DIAGNOSIS — D649 Anemia, unspecified: Secondary | ICD-10-CM

## 2011-08-28 NOTE — Telephone Encounter (Signed)
Labs in EPIC. Note to remind patient. Spoke with patient and gave her lab results and recommendations by Dr. [name].

## 2011-08-28 NOTE — Telephone Encounter (Signed)
Message copied by Hulan Saas on Thu Aug 28, 2011  1:07 PM ------      Message from: Lafayette Dragon      Created: Wed Aug 27, 2011 10:21 PM       Please call pt, all blood tests look good except a very low Iron. She needs to take Iron as much as she can tolerate it. Her sed. rate is slightly elevated as in the past, essentially unchanged. Repeat CBC in 8 weeks.

## 2011-08-29 ENCOUNTER — Ambulatory Visit: Payer: Self-pay | Admitting: Internal Medicine

## 2011-09-25 ENCOUNTER — Other Ambulatory Visit: Payer: Self-pay | Admitting: Internal Medicine

## 2011-09-25 MED ORDER — MERCAPTOPURINE 50 MG PO TABS: 50.0000 mg | ORAL_TABLET | Freq: Every day | ORAL | Status: DC

## 2011-09-25 NOTE — Telephone Encounter (Signed)
Spoke with pt. She needed to make an appointment. I have made her an appointment for May 2013 and she will call back for sooner appointment if she is having problems.  Also sent rx to the pharmacy for her.

## 2011-09-26 ENCOUNTER — Telehealth: Payer: Self-pay | Admitting: Internal Medicine

## 2011-09-26 MED ORDER — TRAMADOL HCL 50 MG PO TABS: ORAL_TABLET | ORAL | Status: DC

## 2011-09-26 MED ORDER — PREDNISONE 10 MG PO TABS: ORAL_TABLET | ORAL | Status: DC

## 2011-09-26 NOTE — Telephone Encounter (Signed)
Spoke with patient and she reports pain in the center of her stomach that started on Tuesday. Appetite is good. Had little bit of nausea. Loose stools 3/day with bright, red blood in stool and when wipes. Taking 6 MP daily but was off it the month of February because of insurance. Taking Humira also. No hydrocodone please because it causes nausea. Per Dr. [name], will take 6 MP a month to work. Prednisone 20 mg x 1 week, 15 mg x 1 week, 10 mg x 1 week then 5 mg x 1 week. Tramadol 50 mg 1-2 tab every 6 hours prn pain#20. Spoke with patient and gave her Dr. Nichola Sizer recommendations. Rx's sent.

## 2011-09-30 ENCOUNTER — Other Ambulatory Visit: Payer: Self-pay | Admitting: Internal Medicine

## 2011-09-30 MED ORDER — LORAZEPAM 1 MG PO TABS: 1.0000 mg | ORAL_TABLET | Freq: Three times a day (TID) | ORAL | Status: DC

## 2011-09-30 NOTE — Telephone Encounter (Signed)
rx sent to pharmacy

## 2011-10-20 ENCOUNTER — Telehealth: Payer: Self-pay | Admitting: *Deleted

## 2011-10-20 NOTE — Telephone Encounter (Signed)
Message copied by Hulan Saas on Mon Oct 20, 2011  2:10 PM ------      Message from: Hulan Saas      Created: Thu Aug 28, 2011  1:12 PM       Call and remind patient about CBC due on 4/11/3 for DB

## 2011-10-20 NOTE — Telephone Encounter (Signed)
Unable to reach patient will try again later.

## 2011-10-21 NOTE — Telephone Encounter (Signed)
Unable to reach patient.

## 2011-10-22 ENCOUNTER — Encounter: Payer: Self-pay | Admitting: *Deleted

## 2011-10-22 NOTE — Telephone Encounter (Signed)
Unable to reach patient. Mailed letter to patient to remind her she is due for CBC.

## 2011-10-22 NOTE — Telephone Encounter (Signed)
Patient due for repeat CBC. Unable to reach her by phone. Mailed letter to patient

## 2011-10-22 NOTE — Telephone Encounter (Signed)
She works during the day.

## 2011-10-28 ENCOUNTER — Telehealth: Payer: Self-pay | Admitting: Internal Medicine

## 2011-10-28 MED ORDER — TRAMADOL HCL 50 MG PO TABS: ORAL_TABLET | ORAL | Status: DC

## 2011-10-28 MED ORDER — PROMETHAZINE HCL 25 MG PO TABS: 25.0000 mg | ORAL_TABLET | ORAL | Status: DC | PRN

## 2011-10-28 NOTE — Telephone Encounter (Signed)
Spoke with patient and she is going to come for labs on Friday.

## 2011-10-28 NOTE — Telephone Encounter (Signed)
Spoke with Dr [name]. She states that it is okay for patient to have refills of Phenergan and Tramadol. Rx's sent to the pharmacy.

## 2011-10-31 ENCOUNTER — Other Ambulatory Visit (INDEPENDENT_AMBULATORY_CARE_PROVIDER_SITE_OTHER): Payer: Self-pay

## 2011-10-31 DIAGNOSIS — D649 Anemia, unspecified: Secondary | ICD-10-CM

## 2011-10-31 LAB — CBC WITH DIFFERENTIAL/PLATELET
Basophils Relative: 0.3 % (ref 0.0–3.0)
Eosinophils Absolute: 0.2 10*3/uL (ref 0.0–0.7)
Eosinophils Relative: 2.7 % (ref 0.0–5.0)
HCT: 33.6 % — ABNORMAL LOW (ref 36.0–46.0)
Lymphs Abs: 2.1 10*3/uL (ref 0.7–4.0)
MCHC: 32 g/dL (ref 30.0–36.0)
MCV: 82.9 fl (ref 78.0–100.0)
Monocytes Absolute: 0.6 10*3/uL (ref 0.1–1.0)
Platelets: 494 10*3/uL — ABNORMAL HIGH (ref 150.0–400.0)
WBC: 8.7 10*3/uL (ref 4.5–10.5)

## 2011-11-01 ENCOUNTER — Telehealth: Payer: Self-pay | Admitting: Internal Medicine

## 2011-11-01 MED ORDER — HYDROCORTISONE ACETATE 25 MG RE SUPP: 25.0000 mg | Freq: Two times a day (BID) | RECTAL | Status: DC

## 2011-11-03 NOTE — Telephone Encounter (Signed)
Discussed rectal bleeding due to hemorrhoids, send Anusol HC supp

## 2011-11-05 ENCOUNTER — Telehealth: Payer: Self-pay | Admitting: *Deleted

## 2011-11-05 NOTE — Telephone Encounter (Signed)
Pt called regarding boil x 2 weeks and wanted to know if she needed OV or if ABX could be called in-called pt back and left VM to callback to make OV with MD for eval of boil.

## 2011-11-07 ENCOUNTER — Telehealth: Payer: Self-pay | Admitting: Internal Medicine

## 2011-11-07 NOTE — Telephone Encounter (Signed)
Spoke with patient's mother. 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 Patient called her PCP and was told she needed to be seen to get antibiotics. Patient is working and could not get to the office. She is wondering if Dr. [name] would order antibiotics.Spoke with Nicoletta Ba, PA, she is not comfortable treating patient without seeing her.Suggests warm sitz's baths. If get bigger, more painful or develops fever MUST go to urgent care. Can schedule for OV on Tues with Nicoletta Ba, PA . Spoke with patient's mother. She states the patient is going to urgent care when she gets off from work today. States she cannot come on Tuesday due to her work schedule. She understands the need to seek treatment urgently for fever or increase in pain or size.

## 2011-11-07 NOTE — Telephone Encounter (Signed)
Line busy - will try again later

## 2011-11-08 ENCOUNTER — Encounter (HOSPITAL_COMMUNITY): Payer: Self-pay | Admitting: *Deleted

## 2011-11-08 ENCOUNTER — Emergency Department (HOSPITAL_COMMUNITY): Payer: 59

## 2011-11-08 ENCOUNTER — Inpatient Hospital Stay (HOSPITAL_COMMUNITY)
Admission: EM | Admit: 2011-11-08 | Discharge: 2011-11-16 | DRG: 348 | Disposition: A | Discharge status: Home / Self Care | Payer: 59 | Attending: Internal Medicine | Admitting: Internal Medicine

## 2011-11-08 DIAGNOSIS — A6000 Herpesviral infection of urogenital system, unspecified: Secondary | ICD-10-CM

## 2011-11-08 DIAGNOSIS — K56609 Unspecified intestinal obstruction, unspecified as to partial versus complete obstruction: Secondary | ICD-10-CM

## 2011-11-08 DIAGNOSIS — L0390 Cellulitis, unspecified: Secondary | ICD-10-CM

## 2011-11-08 DIAGNOSIS — R509 Fever, unspecified: Secondary | ICD-10-CM | POA: Diagnosis present

## 2011-11-08 DIAGNOSIS — N390 Urinary tract infection, site not specified: Secondary | ICD-10-CM

## 2011-11-08 DIAGNOSIS — K5090 Crohn's disease, unspecified, without complications: Secondary | ICD-10-CM

## 2011-11-08 DIAGNOSIS — N179 Acute kidney failure, unspecified: Secondary | ICD-10-CM | POA: Diagnosis present

## 2011-11-08 DIAGNOSIS — E669 Obesity, unspecified: Secondary | ICD-10-CM | POA: Diagnosis present

## 2011-11-08 DIAGNOSIS — J209 Acute bronchitis, unspecified: Secondary | ICD-10-CM

## 2011-11-08 DIAGNOSIS — R110 Nausea: Secondary | ICD-10-CM

## 2011-11-08 DIAGNOSIS — K612 Anorectal abscess: Secondary | ICD-10-CM | POA: Diagnosis present

## 2011-11-08 DIAGNOSIS — F411 Generalized anxiety disorder: Secondary | ICD-10-CM | POA: Diagnosis present

## 2011-11-08 DIAGNOSIS — N920 Excessive and frequent menstruation with regular cycle: Secondary | ICD-10-CM

## 2011-11-08 DIAGNOSIS — K263 Acute duodenal ulcer without hemorrhage or perforation: Secondary | ICD-10-CM

## 2011-11-08 DIAGNOSIS — D509 Iron deficiency anemia, unspecified: Secondary | ICD-10-CM | POA: Diagnosis present

## 2011-11-08 DIAGNOSIS — J019 Acute sinusitis, unspecified: Secondary | ICD-10-CM

## 2011-11-08 DIAGNOSIS — Z8614 Personal history of Methicillin resistant Staphylococcus aureus infection: Secondary | ICD-10-CM

## 2011-11-08 DIAGNOSIS — K5669 Other intestinal obstruction: Principal | ICD-10-CM | POA: Diagnosis present

## 2011-11-08 DIAGNOSIS — I82C19 Acute embolism and thrombosis of unspecified internal jugular vein: Secondary | ICD-10-CM

## 2011-11-08 DIAGNOSIS — R062 Wheezing: Secondary | ICD-10-CM

## 2011-11-08 DIAGNOSIS — D279 Benign neoplasm of unspecified ovary: Secondary | ICD-10-CM | POA: Diagnosis present

## 2011-11-08 DIAGNOSIS — Z9049 Acquired absence of other specified parts of digestive tract: Secondary | ICD-10-CM

## 2011-11-08 DIAGNOSIS — F419 Anxiety disorder, unspecified: Secondary | ICD-10-CM | POA: Diagnosis present

## 2011-11-08 DIAGNOSIS — E876 Hypokalemia: Secondary | ICD-10-CM | POA: Diagnosis not present

## 2011-11-08 LAB — COMPREHENSIVE METABOLIC PANEL
ALT: 10 U/L (ref 0–35)
Albumin: 3.4 g/dL — ABNORMAL LOW (ref 3.5–5.2)
Alkaline Phosphatase: 55 U/L (ref 39–117)
BUN: 6 mg/dL (ref 6–23)
Chloride: 97 mEq/L (ref 96–112)
Glucose, Bld: 129 mg/dL — ABNORMAL HIGH (ref 70–99)
Potassium: 3.2 mEq/L — ABNORMAL LOW (ref 3.5–5.1)
Sodium: 134 mEq/L — ABNORMAL LOW (ref 135–145)
Total Bilirubin: 0.4 mg/dL (ref 0.3–1.2)
Total Protein: 7.9 g/dL (ref 6.0–8.3)

## 2011-11-08 LAB — CBC
Hemoglobin: 10.8 g/dL — ABNORMAL LOW (ref 12.0–15.0)
Platelets: 514 10*3/uL — ABNORMAL HIGH (ref 150–400)
RBC: 4.16 MIL/uL (ref 3.87–5.11)

## 2011-11-08 LAB — URINE MICROSCOPIC-ADD ON

## 2011-11-08 LAB — DIFFERENTIAL
Basophils Relative: 0 % (ref 0–1)
Eosinophils Absolute: 0 10*3/uL (ref 0.0–0.7)
Lymphs Abs: 1.2 10*3/uL (ref 0.7–4.0)
Monocytes Relative: 6 % (ref 3–12)
Neutro Abs: 12.9 10*3/uL — ABNORMAL HIGH (ref 1.7–7.7)
Neutrophils Relative %: 86 % — ABNORMAL HIGH (ref 43–77)

## 2011-11-08 LAB — URINALYSIS, ROUTINE W REFLEX MICROSCOPIC
Bilirubin Urine: NEGATIVE
Glucose, UA: NEGATIVE mg/dL
Hgb urine dipstick: NEGATIVE
Ketones, ur: 15 mg/dL — AB
Specific Gravity, Urine: 1.01 (ref 1.005–1.030)
pH: 6.5 (ref 5.0–8.0)

## 2011-11-08 LAB — LIPASE, BLOOD: Lipase: 10 U/L — ABNORMAL LOW (ref 11–59)

## 2011-11-08 LAB — PREGNANCY, URINE: Preg Test, Ur: NEGATIVE

## 2011-11-08 MED ORDER — ONDANSETRON HCL 4 MG/2ML IJ SOLN
4.0000 mg | Freq: Four times a day (QID) | INTRAMUSCULAR | Status: DC | PRN
  Administered 2011-11-09 – 2011-11-16 (×14): 4 mg via INTRAVENOUS
  Filled 2011-11-08 (×14): qty 2

## 2011-11-08 MED ORDER — HYDROMORPHONE HCL PF 1 MG/ML IJ SOLN
1.0000 mg | Freq: Once | INTRAMUSCULAR | Status: AC
  Administered 2011-11-08: 1 mg via INTRAVENOUS
  Filled 2011-11-08: qty 1

## 2011-11-08 MED ORDER — POTASSIUM CHLORIDE IN NACL 40-0.9 MEQ/L-% IV SOLN
INTRAVENOUS | Status: DC
  Administered 2011-11-08 – 2011-11-13 (×8): via INTRAVENOUS
  Filled 2011-11-08 (×14): qty 1000

## 2011-11-08 MED ORDER — ONDANSETRON HCL 4 MG PO TABS
4.0000 mg | ORAL_TABLET | Freq: Four times a day (QID) | ORAL | Status: DC | PRN
  Administered 2011-11-11 – 2011-11-15 (×3): 4 mg via ORAL
  Filled 2011-11-08 (×3): qty 1

## 2011-11-08 MED ORDER — DIPHENHYDRAMINE HCL 50 MG/ML IJ SOLN
12.5000 mg | Freq: Once | INTRAMUSCULAR | Status: AC
  Administered 2011-11-08: 12.5 mg via INTRAVENOUS
  Filled 2011-11-08: qty 1

## 2011-11-08 MED ORDER — PANTOPRAZOLE SODIUM 40 MG IV SOLR
40.0000 mg | Freq: Two times a day (BID) | INTRAVENOUS | Status: DC
  Administered 2011-11-08 – 2011-11-12 (×10): 40 mg via INTRAVENOUS
  Filled 2011-11-08 (×12): qty 40

## 2011-11-08 MED ORDER — VANCOMYCIN HCL IN DEXTROSE 1-5 GM/200ML-% IV SOLN
1000.0000 mg | Freq: Three times a day (TID) | INTRAVENOUS | Status: DC
Start: 2011-11-08 — End: 2011-11-09
  Administered 2011-11-08 – 2011-11-09 (×4): 1000 mg via INTRAVENOUS
  Filled 2011-11-08 (×5): qty 200

## 2011-11-08 MED ORDER — IOHEXOL 300 MG/ML  SOLN
100.0000 mL | Freq: Once | INTRAMUSCULAR | Status: AC | PRN
  Administered 2011-11-08: 100 mL via INTRAVENOUS

## 2011-11-08 MED ORDER — SODIUM CHLORIDE 0.9 % IV SOLN
INTRAVENOUS | Status: AC
  Administered 2011-11-08: 12:00:00 via INTRAVENOUS

## 2011-11-08 MED ORDER — SODIUM CHLORIDE 0.9 % IV SOLN
Freq: Once | INTRAVENOUS | Status: AC
  Administered 2011-11-08: 09:00:00 via INTRAVENOUS

## 2011-11-08 MED ORDER — LORAZEPAM 2 MG/ML IJ SOLN
0.5000 mg | INTRAMUSCULAR | Status: DC | PRN
  Administered 2011-11-08 – 2011-11-10 (×7): 0.5 mg via INTRAVENOUS
  Filled 2011-11-08 (×7): qty 1

## 2011-11-08 MED ORDER — ONDANSETRON HCL 4 MG/2ML IJ SOLN
4.0000 mg | Freq: Once | INTRAMUSCULAR | Status: AC
  Administered 2011-11-08: 4 mg via INTRAVENOUS
  Filled 2011-11-08: qty 2

## 2011-11-08 MED ORDER — HYDROMORPHONE HCL PF 1 MG/ML IJ SOLN
1.0000 mg | INTRAMUSCULAR | Status: DC | PRN
  Administered 2011-11-08 – 2011-11-16 (×39): 1 mg via INTRAVENOUS
  Filled 2011-11-08 (×39): qty 1

## 2011-11-08 MED ORDER — METOCLOPRAMIDE HCL 5 MG/ML IJ SOLN
5.0000 mg | Freq: Once | INTRAMUSCULAR | Status: AC
  Administered 2011-11-08: 5 mg via INTRAVENOUS
  Filled 2011-11-08: qty 2

## 2011-11-08 MED ORDER — SODIUM CHLORIDE 0.9 % IV BOLUS (SEPSIS)
1000.0000 mL | Freq: Once | INTRAVENOUS | Status: AC
  Administered 2011-11-08: 1000 mL via INTRAVENOUS

## 2011-11-08 MED ORDER — KETOROLAC TROMETHAMINE 30 MG/ML IJ SOLN
30.0000 mg | Freq: Four times a day (QID) | INTRAMUSCULAR | Status: AC | PRN
  Administered 2011-11-08 – 2011-11-13 (×5): 30 mg via INTRAVENOUS
  Filled 2011-11-08 (×4): qty 1

## 2011-11-08 NOTE — Consult Note (Signed)
Reason for Consult:evaluate small bowel obstruction  Referring Physician: Dr. Margreta Journey Rama  [name] is an 21 y.o. female.  HPI: I was asked to see this 21 year old African American female by Dr. Jacquelynn Cree.  The patient states she has been having some abdominal pain intermittently and decreased appetite for one week. She continued having daily bowel movements, 2-4 per day with flatus.. Denies diarrhea. She says she started vomiting at 2 AM this morning and has vomited 4 times, low-volume greenish fluid.  Past history is significant for a laparoscopic cholecystectomy in 2009. The she then presented with Crohn's disease and obstruction and in October of 2011 Dr. Mitzie Na operated on her and performed a resection of the terminal ileum and cecum and did a temporary ileostomy in the left lower quadrant. She recovered from that surgery. She underwent elective laparotomy and closure of her ileostomy in January of 2012. According to the patient, her postop course was counted by an enterocutaneous fistula which healed spontaneously.  Last colonoscopy on 09/16/2010 reveals a normal colon, normal anastomosis, and normal terminal ileum. She is followed frequently but [name]. Her primary care physician is Dr. Gwendolyn Grant.  CT scan shows what looks like a partial small bowel with a transition zone in the mid small bowel near what appears to be a stapled small bowel anastomosis. There is some fecalization of the intestinal contents upstream from this. There also appears to be a second anastomosis of the ileum to the ascending colon.  Significant medical problems include Crohn's disease, depression, anxiety, morbid obesity, history of deep venous thrombosis of the left internal jugular vein, chronic anemia.          Chronic medications include Humira, Proventil inhaler, nexium, Ativan, Phenergan, Proventil. She also takes mercaptopurine.  Past Medical History  Diagnosis Date  .  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 Depression   . Anxiety   . SVT (supraventricular tachycardia)   . Candida esophagitis   . Ovarian cyst, left   . Anemia     iron def  . DVT (deep venous thrombosis) 08/2010    Left IJ (postop) anticoag x 3 mo    Past Surgical History  Procedure Date  . Cholecystectomy 2009  . Closure of ileostomy/loop 08/2010  . Colectomy with diverting loop ileostomy   . Appendectomy     Family History  Problem Relation Age of Onset  . Diabetes Maternal Uncle   . Breast cancer Maternal Grandmother   . Colon cancer Neg Hx   . Diabetes Cousin     Social History:  reports that she has never smoked. She has never used smokeless tobacco. She reports that she does not drink alcohol or use illicit drugs.  Allergies:  Allergies  Allergen Reactions  . Iron     REACTION: IV iron dextran  . Iron Dextran     Medications: see listed meds   Results for orders placed during the hospital encounter of 11/08/11 (from the past 48 hour(s))  CBC     Status: Abnormal   Collection Time   11/08/11  7:25 AM      Component Value Range Comment   WBC 14.9 (*) 4.0 - 10.5 (K/uL)    RBC 4.16  3.87 - 5.11 (MIL/uL)    Hemoglobin 10.8 (*) 12.0 - 15.0 (g/dL)    HCT 33.7 (*) 36.0 - 46.0 (%)    MCV 81.0  78.0 - 100.0 (fL)  MCH 26.0  26.0 - 34.0 (pg)    MCHC 32.0  30.0 - 36.0 (g/dL)    RDW 14.9  11.5 - 15.5 (%)    Platelets 514 (*) 150 - 400 (K/uL)   DIFFERENTIAL     Status: Abnormal   Collection Time   11/08/11  7:25 AM      Component Value Range Comment   Neutrophils Relative 86 (*) 43 - 77 (%)    Neutro Abs 12.9 (*) 1.7 - 7.7 (K/uL)    Lymphocytes Relative 8 (*) 12 - 46 (%)    Lymphs Abs 1.2  0.7 - 4.0 (K/uL)    Monocytes Relative 6  3 - 12 (%)    Monocytes Absolute 0.9  0.1 - 1.0 (K/uL)    Eosinophils Relative 0  0 - 5 (%)    Eosinophils Absolute 0.0  0.0 - 0.7 (K/uL)    Basophils Relative 0  0 - 1 (%)    Basophils  Absolute 0.0  0.0 - 0.1 (K/uL)   COMPREHENSIVE METABOLIC PANEL     Status: Abnormal   Collection Time   11/08/11  7:25 AM      Component Value Range Comment   Sodium 134 (*) 135 - 145 (mEq/L)    Potassium 3.2 (*) 3.5 - 5.1 (mEq/L)    Chloride 97  96 - 112 (mEq/L)    CO2 25  19 - 32 (mEq/L)    Glucose, Bld 129 (*) 70 - 99 (mg/dL)    BUN 6  6 - 23 (mg/dL)    Creatinine, Ser 0.56  0.50 - 1.10 (mg/dL)    Calcium 9.4  8.4 - 10.5 (mg/dL)    Total Protein 7.9  6.0 - 8.3 (g/dL)    Albumin 3.4 (*) 3.5 - 5.2 (g/dL)    AST 14  0 - 37 (U/L)    ALT 10  0 - 35 (U/L)    Alkaline Phosphatase 55  39 - 117 (U/L)    Total Bilirubin 0.4  0.3 - 1.2 (mg/dL)    GFR calc non Af Amer >90  >90 (mL/min)    GFR calc Af Amer >90  >90 (mL/min)   LIPASE, BLOOD     Status: Abnormal   Collection Time   11/08/11  7:25 AM      Component Value Range Comment   Lipase 10 (*) 11 - 59 (U/L)   URINALYSIS, ROUTINE W REFLEX MICROSCOPIC     Status: Abnormal   Collection Time   11/08/11  8:43 AM      Component Value Range Comment   Color, Urine YELLOW  YELLOW     APPearance CLOUDY (*) CLEAR     Specific Gravity, Urine 1.010  1.005 - 1.030     pH 6.5  5.0 - 8.0     Glucose, UA NEGATIVE  NEGATIVE (mg/dL)    Hgb urine dipstick NEGATIVE  NEGATIVE     Bilirubin Urine NEGATIVE  NEGATIVE     Ketones, ur 15 (*) NEGATIVE (mg/dL)    Protein, ur 30 (*) NEGATIVE (mg/dL)    Urobilinogen, UA 0.2  0.0 - 1.0 (mg/dL)    Nitrite NEGATIVE  NEGATIVE     Leukocytes, UA NEGATIVE  NEGATIVE    PREGNANCY, URINE     Status: Normal   Collection Time   11/08/11  8:43 AM      Component Value Range Comment   Preg Test, Ur NEGATIVE  NEGATIVE    URINE MICROSCOPIC-ADD ON  Status: Abnormal   Collection Time   11/08/11  8:43 AM      Component Value Range Comment   Squamous Epithelial / LPF RARE  RARE     WBC, UA 0-2  <3 (WBC/hpf)    Bacteria, UA MANY (*) RARE      Ct Abdomen Pelvis W Contrast  11/08/2011  *RADIOLOGY REPORT*  Clinical Data:  Abdominal pain.  Vomiting.  Rectal boil.  Previous MRSA infections.  History of Crohn's disease with enterocutaneous fistula.  Previous left ovarian cyst, small bowel resection, fistula repair, cholecystectomy and appendectomy.  Leukocytosis.  CT ABDOMEN AND PELVIS WITH CONTRAST  Technique:  Multidetector CT imaging of the abdomen and pelvis was performed following the standard protocol during bolus administration of intravenous contrast.  Contrast: 149m OMNIPAQUE IOHEXOL 300 MG/ML  SOLN  Comparison: Previous examinations, including the pelvic ultrasound dated 02/05/2011 and abdomen and pelvis CT dated 02/05/2011.  Findings: Cholecystectomy clips.  Anterior midline surgical scar with stable underlying high density material, possibly representing suture material.  Multiple mildly to moderately dilated proximal and mid small bowel loops with normal caliber distal small bowel.  Small bowel anastomosis in the upper left abdomen. The dilated small bowel extends to the level of the in anastomosis on the left and is normal caliber distal to the anastomosis.  There is fecal like material in the dilated small bowel at and proximal to the anastomosis.  The distal small bowel is normal in caliber to the level of an anastomosis to the colon in the right upper pelvis.  Small to moderate amount of free peritoneal fluid in the pelvic cul- de-sac containing probable ovaries with small cysts.  Abnormal soft tissue density in the left posterior perianal region, measuring 7.3 x 4.0 cm in maximum dimensions on image number 97. No visible fluid collection.  Stable increased soft tissue density in the left anterior abdominal wall at the junction of the abdomen and pelvis, extending into the subcutaneous fat, compatible with a previous ostomy site.  Interval medium soft tissue density in the underlying peritoneal fat, partially surrounding the dilated small bowel just proximal to the level of the obstruction.  Mild intrahepatic and  proximal extrahepatic biliary ductal dilatation.  Otherwise, normal appearing liver, spleen, pancreas, adrenal glands, kidneys, urinary bladder and uterus.  No enlarged lymph nodes.  Small amount of linear scarring at the right lung base.  Minimal lower thoracic spine degenerative changes.  IMPRESSION:  1. Small bowel obstruction to the level of a small bowel anastomosis in the left upper pelvis.  2.  Interval scar tissue in the anterior peritoneal fat beneath a previous ostomy scar. 3.  Mildly progressive post cholecystectomy biliary ductal dilatation.  This is not felt to represent obstruction, based on a normal total bilirubin today. 4.  Left perianal soft tissue infection without abscess.  Original Report Authenticated By: SGerald Stabs M.D.    ROS Blood pressure 137/82, pulse 86, temperature 97.9 F (36.6 C), temperature source Oral, resp. rate 18, height 5' 4"  (1.626 m), weight 218 lb (98.884 kg), last menstrual period 10/24/2011, SpO2 100.00%. Physical Exam   General: She is alert. Minimal distress. Morbidly obese. Eyes:      Sclerae clear extraocular movements intact Neck:      Supple nontender no adenopathy Lungs:    Clear to auscultation. No wheezes or rhonchi. Heart:      Regular rate and rhythm. No murmur Abdomen    Obese. Soft. Mild diffuse tenderness without peritoneal signs. Midline scar  well healed. Transverse scar left abdomen well-healed. No hernias noted in the abdomen or inguinal area. Extremities: Moves all 4 extremities to command with good range of motion Neurologic:  Alert and oriented x4. Moves all 4 Shoney's to command. No gross motor or sensory deficits.   Assessment/Plan:  Partial small bowel obstruction. This looks more like adhesions and then recurrent Crohn's disease radiographically, but both are possible. No evidence of peritoneal inflammation at this time. Degree with complete bowel rest, hydration and normalization of electrolyte abnormalities, nasogastric  suction. Lower be lab work and abdominal x-rays tomorrow. Will also recommend proton pump inhibitors. Consider [facility] consult, since they know her so well.  Crohn's disease.  All 2 medications for Crohn's disease.  Status post emergent ileocecal resection with ileostomy October 2011.(Dr. Cornett_)  Status post elective laparotomy and ileostomy closure January of 3500, complicated by postop enterocutaneous fistula which resolved with conservative therapy.(Dr,. Cornett)  Past history laparoscopic cholecystectomy  Morbid obesity  Asthma  Dorrance Sellick M. Dalbert Batman, M.D., [facility] General and Minimally invasive Surgery Breast and Colorectal Surgery Office:   [phone] Pager:   [phone]  11/08/2011, 3:03 PM

## 2011-11-08 NOTE — Progress Notes (Signed)
Paged Dr. Rockne Menghini that patient having small amt bright bleeding from rectum with urination.

## 2011-11-08 NOTE — ED Notes (Signed)
Pt aware of the need for a urine sample.

## 2011-11-08 NOTE — Progress Notes (Signed)
Paged dr. Rockne Menghini that patient on floor and wants ativan and something for pain

## 2011-11-08 NOTE — H&P (Signed)
Hospital Admission Note Date: 11/08/2011  Patient name: [name] Medical record number: [id number] Date of birth: [birth year] Age: 21 y.o. Gender: female PCP: Gwendolyn Grant, MD, MD  Attending physician: Venetia Maxon Alpa Salvo, MD Emergency Contact: Kateri Plummer [phone] Code Status:  Full  Chief Complaint: Stomach pain.  History of Present Illness: [name] is an 21 y.o. female with with PMH of MRSA abscesses, Crohn's disease and multiple abdominal surgeries who presented to the hospital with a 2 day history of abdominal pain that has progressed over time.  Developed nausea and vomiting this morning.  No diarrhea.  No fever.  Still able to pass flatus.  Vomitus was bilious.  Abdominal pain is 9/10, sharp and crampy in quality and varies in intensity.  No aggravating factors but pain medications reduce the intensity of the pain.  The patient also complains of a area near her rectum that is swollen and tender.  Upon initial evaluation in the ED, a CT scan of the abdomen/pelvis revealed an acute SBO.  Past Medical History Past Medical History  Diagnosis Date  . 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 Depression   . Anxiety   . SVT (supraventricular tachycardia)   . Candida esophagitis   . Ovarian cyst, left   . Anemia     iron def  . DVT (deep venous thrombosis) 08/2010    Left IJ (postop) anticoag x 3 mo    Past Surgical History Past Surgical History  Procedure Date  . Cholecystectomy 2009  . Closure of ileostomy/loop 08/2010  . Colectomy with diverting loop ileostomy   . Appendectomy     Meds: Prior to Admission medications   Medication Sig Start Date End Date Taking? Authorizing Provider  adalimumab (HUMIRA) 40 MG/0.8ML injection Inject 0.8 mLs (40 mg total) into the skin every 14 (fourteen) days. PLEASE GIVE PENS!!! 04/15/11  Yes Lafayette Dragon, MD  albuterol (PROVENTIL HFA;VENTOLIN HFA) 108 (90 BASE) MCG/ACT inhaler  Inhale 2 puffs into the lungs every 6 (six) hours as needed for wheezing. 06/27/11 06/26/12 Yes Biagio Borg, MD  esomeprazole (NEXIUM) 40 MG capsule Take 1 capsule (40 mg total) by mouth daily before breakfast. 04/15/11  Yes Lafayette Dragon, MD  hydrocortisone (ANUSOL-HC) 25 MG suppository Place 1 suppository (25 mg total) rectally every 12 (twelve) hours. 11/01/11 10/31/12 Yes Lafayette Dragon, MD  LORazepam (ATIVAN) 1 MG tablet Take 1 tablet (1 mg total) by mouth 3 (three) times daily. 09/30/11  Yes Lafayette Dragon, MD  mercaptopurine (PURINETHOL) 50 MG tablet Take 1 tablet (50 mg total) by mouth daily. 09/25/11  Yes Lafayette Dragon, MD  promethazine (PHENERGAN) 25 MG tablet Take 1 tablet (25 mg total) by mouth every 4 (four) hours as needed. Take 1 tablet by mouth every 4-6 hours as needed for nausea 10/28/11  Yes Lafayette Dragon, MD  traMADol Veatrice Bourbon) 50 MG tablet Take 1 or 2 tabs every 6 hours prn pain 10/28/11  Yes Lafayette Dragon, MD    Allergies: Iron and Iron dextran  Social History: History   Social History  . Marital Status: Single    Spouse Name: N/A    Number of Children: 0  . Years of Education: 15   Occupational History  . Student    Social History Main Topics  . Smoking status: Never Smoker   . Smokeless tobacco: Never Used  . Alcohol Use: No  . Drug Use: No  .  Sexually Active: Not on file   Other Topics Concern  . Not on file   Social History Narrative   Single, lives mother.    Family History:  Family History  Problem Relation Age of Onset  . Diabetes Maternal Uncle   . Breast cancer Maternal Grandmother   . Colon cancer Neg Hx   . Diabetes Cousin     Review of Systems: Constitutional: No fever or chills;  Appetite normal up until 2 days; No weight loss.  HEENT: No blurry vision or diplopia, no pharyngitis or dysphagia; Wears glasses. CV: No chest pain or arrhythmia, occasional subjective palpitations.  Resp: No SOB, no cough. GI: + N/V, no diarrhea, +hematochezia.  GU:  No dysuria or hematuria.  MSK: no myalgias/arthralgias.  Neuro:  +headache, no focal neurological deficits.  Psych: No depression, +anxiety.  Endo: No thyroid disease or DM.  Skin: No rashes, +h/o MRSA boils.  Heme: No anemia or blood dyscrasia   Physical Exam: Blood pressure 153/83, pulse 87, temperature 98.1 F (36.7 C), temperature source Oral, resp. rate 18, height 5' 4"  (1.626 m), weight 98.884 kg (218 lb), last menstrual period 10/24/2011, SpO2 100.00%. BP 153/83  Pulse 87  Temp(Src) 98.1 F (36.7 C) (Oral)  Resp 18  Ht 5' 4"  (1.626 m)  Wt 98.884 kg (218 lb)  BMI 37.42 kg/m2  SpO2 100%  LMP 10/24/2011  General Appearance:    Alert, cooperative, no distress, appears stated age  Head:    Normocephalic, without obvious abnormality, atraumatic  Eyes:    PERRL, conjunctiva/corneas clear, EOM's intact  Ears:    Normal external ear canals, both ears  Nose:   Nares normal, septum midline, mucosa normal, no drainage    or sinus tenderness  Throat:   Lips, mucosa, and tongue normal; teeth and gums normal  Neck:   Supple, symmetrical, trachea midline, no adenopathy;    thyroid:  no enlargement/tenderness/nodules; no carotid   bruit or JVD  Lungs:     Diminished bilaterally, respirations unlabored   Heart:    Regular rate and rhythm, S1 and S2 normal, no murmur, rub   or gallop  Abdomen:     Soft, non-tender, bowel sounds active all four quadrants,    no masses, no organomegaly  Genitalia:    Normal female with area of perirectal induration left lower buttock, no fluctuance.    Rectal:    Deferred.  Extremities:   Extremities normal, atraumatic, no cyanosis or edema  Pulses:   2+ and symmetric all extremities  Skin:   Skin color, texture, turgor normal, no rashes or lesions  Neurologic:   Non-focal   Lab results: Basic Metabolic Panel:  Lab 95/28/41 0725  NA 134*  K 3.2*  CL 97  CO2 25  GLUCOSE 129*  BUN 6  CREATININE 0.56  CALCIUM 9.4  MG --  PHOS --   GFR Estimated  Creatinine Clearance: 126.1 ml/min (by C-G formula based on Cr of 0.56). Liver Function Tests:  Lab 11/08/11 0725  AST 14  ALT 10  ALKPHOS 55  BILITOT 0.4  PROT 7.9  ALBUMIN 3.4*    Lab 11/08/11 0725  LIPASE 10*  AMYLASE --    CBC:  Lab 11/08/11 0725  WBC 14.9*  NEUTROABS 12.9*  HGB 10.8*  HCT 33.7*  MCV 81.0  PLT 514*    Imaging results:   Ct Abdomen Pelvis W Contrast 11/08/2011 IMPRESSION:  1. Small bowel obstruction to the level of a small bowel anastomosis in  the left upper pelvis.  2.  Interval scar tissue in the anterior peritoneal fat beneath a previous ostomy scar. 3.  Mildly progressive post cholecystectomy biliary ductal dilatation.  This is not felt to represent obstruction, based on a normal total bilirubin today. 4.  Left perianal soft tissue infection without abscess.  Original Report Authenticated By: Gerald Stabs, M.D.    Assessment & Plan: Principal Problem:  *SBO (small bowel obstruction)  Confirmed by CT scan on 11/08/11.  Surgical consultation requested.  Placed on NG tube gastric decompression and bowel rest.  KUB ordered for a.m. To follow.  Replace K+ Active Problems:  CROHN'S DISEASE  Medications on hold.  Will route H&P to Dr. [name] to make her aware of patient's admission.  Hypokalemia  Replace in IVF.  Anxiety  Hold oral Ativan, give IV PRN.  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  No evidence of abscess on CT.  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  Check PCR MRSA nasal swab and initiate eradication therapy if positive.  Prophylaxis: PAS hoses for DVT prophylaxis given possible need to take to surgery.  Time Spent On Admission: 50 minutes.  Sharice Harriss 11/08/2011, 1:33 PM Pager [phone]

## 2011-11-08 NOTE — ED Notes (Signed)
Patient transported to CT 

## 2011-11-08 NOTE — Progress Notes (Signed)
Patient complaints of pain rated 8/10, but then resumes eyes closed with snoring respirations. Face and extremities relaxed. Will check for orders on pain meds and monitor drowsiness.

## 2011-11-08 NOTE — Progress Notes (Signed)
ANTIBIOTIC CONSULT NOTE - INITIAL  Pharmacy Consult for Vancomycin Indication: Cellulitis  Allergies  Allergen Reactions  . Iron     REACTION: IV iron dextran  . Iron Dextran     Patient Measurements: Height: 5' 4"  (162.6 cm) Weight: 218 lb (98.884 kg) IBW/kg (Calculated) : 54.7   Vital Signs: Temp: 98.1 F (36.7 C) (04/27 1206) Temp src: Oral (04/27 1206) BP: 153/83 mmHg (04/27 1206) Pulse Rate: 87  (04/27 1206) Intake/Output from previous day:   Intake/Output from this shift:    Labs:  Basename 11/08/11 0725  WBC 14.9*  HGB 10.8*  PLT 514*  LABCREA --  CREATININE 0.56   Estimated Creatinine Clearance: 126.1 ml/min (by C-G formula based on Cr of 0.56). Normalized CrCl >100 ml/min/1.80m Microbiology: No results found for this or any previous visit (from the past 720 hour(s)).  Medical History: Past Medical History  Diagnosis Date  . 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 Depression   . Anxiety   . SVT (supraventricular tachycardia)   . Candida esophagitis   . Ovarian cyst, left   . Anemia     iron def  . DVT (deep venous thrombosis) 08/2010    Left IJ (postop) anticoag x 3 mo    Medications:  Anti-infectives    None     Assessment: 20 YOF w/ hx Crohns admitted 4/27 w/ abdomnial pain. Has hx MRSA abscesses. 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   Follow labs, vitals and cultures  Vanc tr at steady state  Adjust dose as necessary  CMarcell AngerPharmD  [phone]/27/2013 1:51 PM

## 2011-11-08 NOTE — ED Notes (Signed)
Pt c/o abdominal pain all week, vomiting starting x 5 hrs ago.

## 2011-11-08 NOTE — ED Provider Notes (Signed)
History     CSN: [id number]  Arrival date & time 11/08/11  9450   First MD Initiated Contact with Patient 11/08/11 0701      Chief Complaint  Patient presents with  . Emesis    (Consider location/radiation/quality/duration/timing/severity/associated sxs/prior treatment) Patient is a 21 y.o. female presenting with vomiting. The history is provided by the patient.  Emesis  This is a recurrent problem. The current episode started 6 to 12 hours ago. The problem has been gradually worsening. The emesis has an appearance of bilious material. There has been no fever. Associated symptoms include abdominal pain and chills. Pertinent negatives include no diarrhea and no fever.  h/o chrons dz and this feels like a   Past Medical History  Diagnosis Date  . 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 Depression   . Anxiety   . SVT (supraventricular tachycardia)   . Candida esophagitis   . Ovarian cyst, left   . Anemia     iron def  . DVT (deep venous thrombosis) 08/2010    Left IJ (postop) anticoag x 3 mo    Past Surgical History  Procedure Date  . Cholecystectomy 2009  . Closure of ileostomy/loop 08/2010  . Colectomy with diverting loop ileostomy   . Appendectomy     Family History  Problem Relation Age of Onset  . Diabetes Maternal Uncle   . Breast cancer Maternal Grandmother   . Colon cancer Neg Hx   . Diabetes Cousin     History  Substance Use Topics  . Smoking status: Never Smoker   . Smokeless tobacco: Never Used  . Alcohol Use: No    OB History    Grav Para Term Preterm Abortions TAB SAB Ect Mult Living                  Review of Systems  Constitutional: Positive for chills. Negative for fever.  Gastrointestinal: Positive for vomiting and abdominal pain. Negative for diarrhea.  All other systems reviewed and are negative.    Allergies  Iron and Iron dextran  Home Medications   Current Outpatient Rx  Name  Route Sig Dispense Refill  . ADALIMUMAB 40 MG/0.8ML [place] KIT Subcutaneous Inject 0.8 mLs (40 mg total) into the skin every 14 (fourteen) days. PLEASE GIVE PENS!!! 2 each 5  . ALBUTEROL SULFATE HFA 108 (90 BASE) MCG/ACT IN AERS Inhalation Inhale 2 puffs into the lungs every 6 (six) hours as needed for wheezing. 1 Inhaler 1  . ESOMEPRAZOLE MAGNESIUM 40 MG PO CPDR Oral Take 1 capsule (40 mg total) by mouth daily before breakfast. 90 capsule 3  . HYDROCORTISONE ACETATE 25 MG RE SUPP Rectal Place 1 suppository (25 mg total) rectally every 12 (twelve) hours. 12 suppository 2  . LORAZEPAM 1 MG PO TABS Oral Take 1 tablet (1 mg total) by mouth 3 (three) times daily. 30 tablet 1    NEED OFFICE VISIT FOR FURTHER REFILLS  . MERCAPTOPURINE 50 MG PO TABS Oral Take 1 tablet (50 mg total) by mouth daily. 30 tablet 2  . PROMETHAZINE HCL 25 MG PO TABS Oral Take 1 tablet (25 mg total) by mouth every 4 (four) hours as needed. Take 1 tablet by mouth every 4-6 hours as needed for nausea 30 tablet 1  . TRAMADOL HCL 50 MG PO TABS  Take 1 or 2 tabs every 6 hours prn pain 20 tablet 1    BP 148/81  Pulse  109  Temp(Src) 98.7 F (37.1 C) (Oral)  Resp 20  SpO2 98%  LMP 10/24/2011  Physical Exam  Nursing note and vitals reviewed. Constitutional: She is oriented to person, place, and time. She appears well-developed and well-nourished.  Non-toxic appearance. No distress.  HENT:  Head: Normocephalic and atraumatic.  Eyes: Conjunctivae, EOM and lids are normal. Pupils are equal, round, and reactive to light.  Neck: Normal range of motion. Neck supple. No tracheal deviation present. No mass present.  Cardiovascular: Regular rhythm and normal heart sounds.  Tachycardia present.  Exam reveals no gallop.   No murmur heard. Pulmonary/Chest: Effort normal and breath sounds normal. No stridor. No respiratory distress. She has no decreased breath sounds. She has no wheezes. She has no rhonchi. She has no rales.  Abdominal: Soft.  Normal appearance and bowel sounds are normal. She exhibits no distension. There is tenderness in the left upper quadrant and left lower quadrant. There is no rigidity, no rebound, no guarding and no CVA tenderness.  Musculoskeletal: Normal range of motion. She exhibits no edema and no tenderness.  Neurological: She is alert and oriented to person, place, and time. She has normal strength. No cranial nerve deficit or sensory deficit. GCS eye subscore is 4. GCS verbal subscore is 5. GCS motor subscore is 6.  Skin: Skin is warm and dry. No abrasion and no rash noted.  Psychiatric: She has a normal mood and affect. Her speech is normal and behavior is normal.    ED Course  Procedures (including critical care time)   Labs Reviewed  CBC  DIFFERENTIAL  COMPREHENSIVE METABOLIC PANEL  LIPASE, BLOOD  URINALYSIS, ROUTINE W REFLEX MICROSCOPIC  URINE CULTURE  PREGNANCY, URINE   No results found.   No diagnosis found.    MDM      Patient given IV fluids and pain medication. Patient also give anti-medics. Patient had NG tube placed by nursing and she will be admitted by triad hospitalist.      Leota Jacobsen, MD 11/08/11 1039

## 2011-11-08 NOTE — ED Notes (Signed)
Pt states pain and nausea have increased and is requesting more medication.

## 2011-11-09 ENCOUNTER — Inpatient Hospital Stay (HOSPITAL_COMMUNITY): Payer: 59

## 2011-11-09 DIAGNOSIS — K56609 Unspecified intestinal obstruction, unspecified as to partial versus complete obstruction: Secondary | ICD-10-CM

## 2011-11-09 DIAGNOSIS — K5090 Crohn's disease, unspecified, without complications: Secondary | ICD-10-CM

## 2011-11-09 LAB — URINE CULTURE
Colony Count: 50000
Culture  Setup Time: 201304271416

## 2011-11-09 LAB — CBC
HCT: 31.3 % — ABNORMAL LOW (ref 36.0–46.0)
Hemoglobin: 9.7 g/dL — ABNORMAL LOW (ref 12.0–15.0)
MCHC: 31 g/dL (ref 30.0–36.0)
RBC: 3.79 MIL/uL — ABNORMAL LOW (ref 3.87–5.11)

## 2011-11-09 LAB — BASIC METABOLIC PANEL
BUN: 4 mg/dL — ABNORMAL LOW (ref 6–23)
Chloride: 104 mEq/L (ref 96–112)
GFR calc Af Amer: >90 mL/min (ref >90)
GFR calc non Af Amer: >90 mL/min (ref >90)
Potassium: 3.9 mEq/L (ref 3.5–5.1)
Sodium: 137 mEq/L (ref 135–145)

## 2011-11-09 LAB — DIFFERENTIAL
Basophils Relative: 0 % (ref 0–1)
Lymphocytes Relative: 14 % (ref 12–46)
Lymphs Abs: 1.2 10*3/uL (ref 0.7–4.0)
Monocytes Absolute: 0.6 10*3/uL (ref 0.1–1.0)
Monocytes Relative: 7 % (ref 3–12)
Neutro Abs: 6.8 10*3/uL (ref 1.7–7.7)
Neutrophils Relative %: 78 % — ABNORMAL HIGH (ref 43–77)

## 2011-11-09 MED ORDER — DIPHENHYDRAMINE HCL 50 MG/ML IJ SOLN
12.5000 mg | Freq: Four times a day (QID) | INTRAMUSCULAR | Status: DC | PRN
  Administered 2011-11-09 – 2011-11-15 (×7): 12.5 mg via INTRAVENOUS
  Filled 2011-11-09 (×7): qty 1

## 2011-11-09 MED ORDER — DIPHENHYDRAMINE HCL 50 MG/ML IJ SOLN
INTRAMUSCULAR | Status: AC
  Filled 2011-11-09: qty 1

## 2011-11-09 MED ORDER — VANCOMYCIN HCL 1000 MG IV SOLR
1250.0000 mg | Freq: Three times a day (TID) | INTRAVENOUS | Status: DC
  Administered 2011-11-09 – 2011-11-11 (×6): 1250 mg via INTRAVENOUS
  Filled 2011-11-09 (×8): qty 1250

## 2011-11-09 NOTE — Consult Note (Signed)
Referring Provider: Dr Rockne Menghini Primary Care Physician:  Gwendolyn Grant, MD, MD Primary Gastroenterologist:  Dr.Brodie  Reason for Consultation:  SBO  HPI: Jesscia [name] is a 21 y.o. female well T. Dr. [name] with history of Crohn's disease which has involved the duodenum, terminal ileum ,and colon. She is status post ileocecectomy and diverting ileostomy which was followed by takedown of the ileostomy in January 2012. She is currently being maintained on Humira  every 2 weeks and 6MP 50 mg daily. She had been doing well and was last seen in the office in February. Patient reports that she had the onset some crampy abdominal pain about a week ago,which has persisted and then progressed on Saturday to more intense crampy abdominal pain nausea and vomiting. She also noted a peri-anall swelling which she felt was consistent with a boil which she has had in the past. She said it had been presen ta couple of weeks ago, resolved and came back and had gotten progressively bigger over this past week. She had not had any associated fever chills or drainage.  Patient came to the emergency room yesterday because of the vomiting and underwent CT scan of the abdomen and pelvis which shows a small bowel obstruction with dilated proximal and mid small bowel and normal caliber distal small bowel she also has a soft tissue density in the lateral left posterior perianal area measuring 7.3 x 4 cm consistent with a soft tissue infection without evidence of abscess.  She has been seen and evaluated by surgery and NG tube was placed for decompression. She feels better today but still sore across her mid abdomen and states she has had 2 bowel movements. Followup abdominal films today are pending.  Last colonoscopy March 2012 was normal, including a normal appearing ileocolonic anastomosis   Past Medical History  Diagnosis Date  . 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 Depression   . Anxiety   . SVT (supraventricular tachycardia)   . Candida esophagitis   . Ovarian cyst, left   . Anemia     iron def  . DVT (deep venous thrombosis) 08/2010    Left IJ (postop) anticoag x 3 mo    Past Surgical History  Procedure Date  . Cholecystectomy 2009  . Closure of ileostomy/loop 08/2010  . Colectomy with diverting loop ileostomy   . Appendectomy     Prior to Admission medications   Medication Sig Start Date End Date Taking? Authorizing Provider  adalimumab (HUMIRA) 40 MG/0.8ML injection Inject 0.8 mLs (40 mg total) into the skin every 14 (fourteen) days. PLEASE GIVE PENS!!! 04/15/11  Yes Lafayette Dragon, MD  albuterol (PROVENTIL HFA;VENTOLIN HFA) 108 (90 BASE) MCG/ACT inhaler Inhale 2 puffs into the lungs every 6 (six) hours as needed for wheezing. 06/27/11 06/26/12 Yes Biagio Borg, MD  esomeprazole (NEXIUM) 40 MG capsule Take 1 capsule (40 mg total) by mouth daily before breakfast. 04/15/11  Yes Lafayette Dragon, MD  hydrocortisone (ANUSOL-HC) 25 MG suppository Place 1 suppository (25 mg total) rectally every 12 (twelve) hours. 11/01/11 10/31/12 Yes Lafayette Dragon, MD  LORazepam (ATIVAN) 1 MG tablet Take 1 tablet (1 mg total) by mouth 3 (three) times daily. 09/30/11  Yes Lafayette Dragon, MD  mercaptopurine (PURINETHOL) 50 MG tablet Take 1 tablet (50 mg total) by mouth daily. 09/25/11  Yes Lafayette Dragon, MD  promethazine (PHENERGAN) 25 MG tablet Take 1 tablet (25 mg total)  by mouth every 4 (four) hours as needed. Take 1 tablet by mouth every 4-6 hours as needed for nausea 10/28/11  Yes Lafayette Dragon, MD  traMADol Veatrice Bourbon) 50 MG tablet Take 1 or 2 tabs every 6 hours prn pain 10/28/11  Yes Lafayette Dragon, MD    Current Facility-Administered Medications  Medication Dose Route Frequency Provider Last Rate Last Dose  . 0.9 %  sodium chloride infusion   Intravenous STAT Leota Jacobsen, MD 200 mL/hr at 11/08/11 1217    . 0.9 % NaCl with KCl 40 mEq / L  infusion   Intravenous  Continuous Adin Hector, MD 150 mL/hr at 11/09/11 0515    . diphenhydrAMINE (BENADRYL) 50 MG/ML injection           . diphenhydrAMINE (BENADRYL) injection 12.5 mg  12.5 mg Intravenous Q6H PRN Harless Litten, NP   12.5 mg at 11/09/11 0011  . HYDROmorphone (DILAUDID) injection 1 mg  1 mg Intravenous Q2H PRN Venetia Maxon Rama, MD   1 mg at 11/09/11 0837  . ketorolac (TORADOL) 30 MG/ML injection 30 mg  30 mg Intravenous Q6H PRN Venetia Maxon Rama, MD   30 mg at 11/08/11 1944  . LORazepam (ATIVAN) injection 0.5 mg  0.5 mg Intravenous Q4H PRN Venetia Maxon Rama, MD   0.5 mg at 11/09/11 1015  . ondansetron (ZOFRAN) tablet 4 mg  4 mg Oral Q6H PRN Venetia Maxon Rama, MD       Or  . ondansetron (ZOFRAN) injection 4 mg  4 mg Intravenous Q6H PRN Venetia Maxon Rama, MD   4 mg at 11/09/11 0245  . pantoprazole (PROTONIX) injection 40 mg  40 mg Intravenous Q12H Adin Hector, MD   40 mg at 11/09/11 1015  . vancomycin (VANCOCIN) IVPB 1000 mg/200 mL premix  1,000 mg Intravenous Q8H Marcell Anger, PHARMD   1,000 mg at 11/09/11 0541    Allergies as of 11/08/2011 - Review Complete 11/08/2011  Allergen Reaction Noted  . Iron  09/05/2010  . Iron dextran  08/19/2010    Family History  Problem Relation Age of Onset  . Diabetes Maternal Uncle   . Breast cancer Maternal Grandmother   . Colon cancer Neg Hx   . Diabetes Cousin     History   Social History  . Marital Status: Single    Spouse Name: N/A    Number of Children: 0  . Years of Education: 15   Occupational History  . Student    Social History Main Topics  . Smoking status: Never Smoker   . Smokeless tobacco: Never Used  . Alcohol Use: 0.0 oz/week    0 Glasses of wine, 0 Cans of beer, 0 Shots of liquor, 0 Drinks containing 0.5 oz of alcohol per week  . Drug Use: No  . Sexually Active: Yes    Birth Control/ Protection: None   Other Topics Concern  . Not on file   Social History Narrative   Single, lives mother.    Review of  Systems: .Pertinent positive and negative review of systems were noted in the above HPI section.  All other review of systems was otherwise negative. Physical Exam: Vital signs in last 24 hours: Temp:  [97.9 F (36.6 C)-98.5 F (36.9 C)] 98.5 F (36.9 C) (04/28 0443) Pulse Rate:  [86-94] 94  (04/28 0443) Resp:  [18] 18  (04/28 0443) BP: (137-153)/(79-90) 138/90 mmHg (04/28 0443) SpO2:  [98 %-100 %] 99 % (04/28 0443) Weight:  [397  lb (98.884 kg)] 218 lb (98.884 kg) (04/27 1206) Last BM Date: 11/09/11 General:   Alert,  Well-developed,obese, pleasant and cooperative in NAD,NG tube in place Head:  Normocephalic and atraumatic. Eyes:  Sclera clear, no icterus.   Conjunctiva pink. Ears:  Normal auditory acuity. Nose:  No deformity, discharge,  or lesions. Mouth:  No deformity or lesions.   Neck:  Supple; no masses or thyromegaly. Lungs:  Clear throughout to auscultation.   No wheezes, crackles, or rhonchi. Heart:  Regular rate and rhythm; no murmurs, clicks, rubs,  or gallops. Abdomen:  Soft, nightly distended and tender across the mid abdomen and common midline incisional scar bowel sounds are present, no palpable mass or hepatosplenomegaly no guarding or rebound.   Rectal:  Deferred ,examination of the perirectal area shows an area of induration and swelling in the left perianal area which is tender, there is no evidence of fistula and no drainage Msk:  Symmetrical without gross deformities. . Pulses:  Normal pulses noted. Extremities:  Without clubbing or edema. Neurologic:  Alert and  oriented x4;  grossly normal neurologically. Skin:  Intact without significant lesions or rashes.. Psych:  Alert and cooperative. Normal mood and affect.  Intake/Output from previous day: 04/27 0701 - 04/28 0700 In: 2877.5 [I.V.:2247.5; NG/GT:30; IV Piggyback:600] Out: 1100 [Urine:700; Emesis/NG output:400] Intake/Output this shift: Total I/O In: -  Out: 300 [Urine:300]  Lab Results:  [facility]  11/09/11 0420 11/08/11 0725  WBC 8.7 14.9*  HGB 9.7* 10.8*  HCT 31.3* 33.7*  PLT 500* 514*   BMET  Basename 11/09/11 0420 11/08/11 0725  NA 137 134*  K 3.9 3.2*  CL 104 97  CO2 25 25  GLUCOSE 87 129*  BUN 4* 6  CREATININE 0.52 0.56  CALCIUM 8.6 9.4   LFT  Basename 11/08/11 0725  PROT 7.9  ALBUMIN 3.4*  AST 14  ALT 10  ALKPHOS 55  BILITOT 0.4  BILIDIR --  IBILI --     Studies/Results: Ct Abdomen Pelvis W Contrast  11/08/2011  *RADIOLOGY REPORT*  Clinical Data: Abdominal pain.  Vomiting.  Rectal boil.  Previous MRSA infections.  History of Crohn's disease with enterocutaneous fistula.  Previous left ovarian cyst, small bowel resection, fistula repair, cholecystectomy and appendectomy.  Leukocytosis.  CT ABDOMEN AND PELVIS WITH CONTRAST  Technique:  Multidetector CT imaging of the abdomen and pelvis was performed following the standard protocol during bolus administration of intravenous contrast.  Contrast: 130m OMNIPAQUE IOHEXOL 300 MG/ML  SOLN  Comparison: Previous examinations, including the pelvic ultrasound dated 02/05/2011 and abdomen and pelvis CT dated 02/05/2011.  Findings: Cholecystectomy clips.  Anterior midline surgical scar with stable underlying high density material, possibly representing suture material.  Multiple mildly to moderately dilated proximal and mid small bowel loops with normal caliber distal small bowel.  Small bowel anastomosis in the upper left abdomen. The dilated small bowel extends to the level of the in anastomosis on the left and is normal caliber distal to the anastomosis.  There is fecal like material in the dilated small bowel at and proximal to the anastomosis.  The distal small bowel is normal in caliber to the level of an anastomosis to the colon in the right upper pelvis.  Small to moderate amount of free peritoneal fluid in the pelvic cul- de-sac containing probable ovaries with small cysts.  Abnormal soft tissue density in the left posterior  perianal region, measuring 7.3 x 4.0 cm in maximum dimensions on image number 97. No visible fluid collection.  Stable increased soft tissue density in the left anterior abdominal wall at the junction of the abdomen and pelvis, extending into the subcutaneous fat, compatible with a previous ostomy site.  Interval medium soft tissue density in the underlying peritoneal fat, partially surrounding the dilated small bowel just proximal to the level of the obstruction.  Mild intrahepatic and proximal extrahepatic biliary ductal dilatation.  Otherwise, normal appearing liver, spleen, pancreas, adrenal glands, kidneys, urinary bladder and uterus.  No enlarged lymph nodes.  Small amount of linear scarring at the right lung base.  Minimal lower thoracic spine degenerative changes.  IMPRESSION:  1. Small bowel obstruction to the level of a small bowel anastomosis in the left upper pelvis.  2.  Interval scar tissue in the anterior peritoneal fat beneath a previous ostomy scar. 3.  Mildly progressive post cholecystectomy biliary ductal dilatation.  This is not felt to represent obstruction, based on a normal total bilirubin today. 4.  Left perianal soft tissue infection without abscess.  Original Report Authenticated By: Gerald Stabs, M.D.   Dg Abd 2 Views  11/09/2011  *RADIOLOGY REPORT*  Clinical Data: Follow-up small bowel obstruction.  History of Crohn's disease and bowel resection.  ABDOMEN - 2 VIEW  Comparison: CT obtained yesterday.  Findings: No significant change in dilated small bowel loops without free peritoneal air.  Interval passage of the oral contrast into normal-caliber colon.  Nasogastric tube tip in the proximal stomach and side hole in the distal esophagus.  Cholecystectomy clips.  Minimal scoliosis.  IMPRESSION:  1.  Stable partial small bowel obstruction. 2.  Nasogastric tube tip in the proximal stomach and side hole in the distal esophagus.  Original Report Authenticated By: Gerald Stabs, M.D.     IMPRESSION:  #39 21 year old female with Crohn's disease previously documented in the duodenum ileum and colon, status post previous ileocecectomy with colostomy and takedown of colostomy January 2012. Maintained on Humira injections every 2 weeks and 6MP. Patient now presents with small bowel obstruction.she appears to be improving with NG decompression and is now having bowel movements. There no significant inflammatory changes noted in the small bowel and this is more likely related to adhesions. #2 left perianal soft tissue infection again likely related to Crohn's no evidence of abscess on CT. Patient is on IV vancomycin and tenderness has improved over the past 24-hour #3 status post cholecystectomy, and appendectomy #4 anxiety #5 history of left IJ DVT postoperatively 2012  PLAN: #1 Agree with surgical recommendation to continue NG decompression today, and followup abdominal films in the a.m. Is continued improvement hopefully can DC the NG tube. #2 continue IV vancomycin for perianal soft tissue infection. #3 hold 6-MP and he'll able to take by mouth as and certain she will not need surgery. Thank you for the consult we will follow along with you.   Alexsandra Shontz  11/09/2011, 11:27 AM

## 2011-11-09 NOTE — Progress Notes (Signed)
ANTIBIOTIC CONSULT NOTE - Follow UP Pharmacy Consult for Vancomycin Indication: Cellulitis  Allergies  Allergen Reactions  . Iron     REACTION: IV iron dextran  . Iron Dextran     Patient Measurements: Height: 5' 4"  (162.6 cm) Weight: 218 lb (98.884 kg) IBW/kg (Calculated) : 54.7   Vital Signs: Temp: 98.5 F (36.9 C) (04/28 0443) Temp src: Oral (04/28 0443) BP: 138/90 mmHg (04/28 0443) Pulse Rate: 94  (04/28 0443) Intake/Output from previous day: 04/27 0701 - 04/28 0700 In: 2877.5 [I.V.:2247.5; NG/GT:30; IV Piggyback:600] Out: 1100 [Urine:700; Emesis/NG output:400] Intake/Output from this shift: Total I/O In: -  Out: 300 [Urine:300]  Labs:  [facility] 11/09/11 0420 11/08/11 0725  WBC 8.7 14.9*  HGB 9.7* 10.8*  PLT 500* 514*  LABCREA -- --  CREATININE 0.52 0.56   Estimated Creatinine Clearance: 126.1 ml/min (by C-G formula based on Cr of 0.52). Normalized CrCl >100 ml/min/1.55m Microbiology: Recent Results (from the past 720 hour(s))  URINE CULTURE     Status: Normal   Collection Time   11/08/11  8:43 AM      Component Value Range Status Comment   Specimen Description URINE, CLEAN CATCH   Final    Special Requests NONE   Final    Culture  Setup Time [id number]  Final    Colony Count 50,000 COLONIES/ML   Final    Culture     Final    Value: Multiple bacterial morphotypes present, none predominant. Suggest appropriate recollection if clinically indicated.   Report Status 11/09/2011 FINAL   Final   MRSA PCR SCREENING     Status: Normal   Collection Time   11/08/11  2:28 PM      Component Value Range Status Comment   MRSA by PCR NEGATIVE  NEGATIVE  Final     Medical History: Past Medical History  Diagnosis Date  . 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 Depression   . Anxiety   . SVT (supraventricular tachycardia)   . Candida esophagitis   . Ovarian cyst, left   . Anemia     iron def  . DVT (deep venous  thrombosis) 08/2010    Left IJ (postop) anticoag x 3 mo    Medications:  Anti-infectives     Start     Dose/Rate Route Frequency Ordered Stop   11/08/11 1400   vancomycin (VANCOCIN) IVPB 1000 mg/200 mL premix        1,000 mg 200 mL/hr over 60 Minutes Intravenous Every 8 hours 11/08/11 1352           Assessment:  20 YOF w/ hx Crohns admitted 4/27 w/ abdomnial pain. Has hx MRSA abscesses.   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   Follow labs, vitals and cultures  Repeat Vanc tr at steady state if necessary  Adjust dose as necessary  CMarcell AngerPharmD  [phone]/28/2013 2:28 PM

## 2011-11-09 NOTE — Progress Notes (Signed)
Patient ambulating in room and bathing. Called to room and patient states her nose was itching and she states her NGT came out when she got soap on her face. Paged Dr. Hassell Done to see if he would like replaced.

## 2011-11-09 NOTE — Progress Notes (Signed)
Subjective: She says she feels better. The pain is less but has not resolved. She had 2 bowel movements. In G. Drainage bilious but only 400 cc overnight.  Abdominal x-rays this morning showed that CT contrast has moved into the colon but there is still some small bowel gas centrally. Overall it looks better, but SBO not completely resolved.  Objective: Vital signs in last 24 hours: Temp:  [97.9 F (36.6 C)-98.5 F (36.9 C)] 98.5 F (36.9 C) (04/28 0443) Pulse Rate:  [86-94] 94  (04/28 0443) Resp:  [18] 18  (04/28 0443) BP: (137-153)/(79-90) 138/90 mmHg (04/28 0443) SpO2:  [98 %-100 %] 99 % (04/28 0443) Weight:  [218 lb (98.884 kg)] 218 lb (98.884 kg) (04/27 1206) Last BM Date: 11/08/11  Intake/Output from previous day: 04/27 0701 - 04/28 0700 In: 2877.5 [I.V.:2247.5; NG/GT:30; IV Piggyback:600] Out: 1100 [Urine:700; Emesis/NG output:400] Intake/Output this shift: Total I/O In: -  Out: 300 [Urine:300]  General appearance: the patient looks comfortable.  In G. drainage bilious. Mental status normal.  No distress. Abdomen: Obese. Soft. Hypoactive bowel sounds. Still a little bit of mild, diffuse, nonlocalizing tenderness. Wounds healed. No hernias.  Lab Results:   Basename 11/09/11 0420 11/08/11 0725  WBC 8.7 14.9*  HGB 9.7* 10.8*  HCT 31.3* 33.7*  PLT 500* 514*   BMET  Basename 11/09/11 0420 11/08/11 0725  NA 137 134*  K 3.9 3.2*  CL 104 97  CO2 25 25  GLUCOSE 87 129*  BUN 4* 6  CREATININE 0.52 0.56  CALCIUM 8.6 9.4   PT/INR No results found for this basename: LABPROT:2,INR:2 in the last 72 hours ABG No results found for this basename: PHART:2,PCO2:2,PO2:2,HCO3:2 in the last 72 hours  Studies/Results: Ct Abdomen Pelvis W Contrast  11/08/2011  *RADIOLOGY REPORT*  Clinical Data: Abdominal pain.  Vomiting.  Rectal boil.  Previous MRSA infections.  History of Crohn's disease with enterocutaneous fistula.  Previous left ovarian cyst, small bowel resection,  fistula repair, cholecystectomy and appendectomy.  Leukocytosis.  CT ABDOMEN AND PELVIS WITH CONTRAST  Technique:  Multidetector CT imaging of the abdomen and pelvis was performed following the standard protocol during bolus administration of intravenous contrast.  Contrast: 123m OMNIPAQUE IOHEXOL 300 MG/ML  SOLN  Comparison: Previous examinations, including the pelvic ultrasound dated 02/05/2011 and abdomen and pelvis CT dated 02/05/2011.  Findings: Cholecystectomy clips.  Anterior midline surgical scar with stable underlying high density material, possibly representing suture material.  Multiple mildly to moderately dilated proximal and mid small bowel loops with normal caliber distal small bowel.  Small bowel anastomosis in the upper left abdomen. The dilated small bowel extends to the level of the in anastomosis on the left and is normal caliber distal to the anastomosis.  There is fecal like material in the dilated small bowel at and proximal to the anastomosis.  The distal small bowel is normal in caliber to the level of an anastomosis to the colon in the right upper pelvis.  Small to moderate amount of free peritoneal fluid in the pelvic cul- de-sac containing probable ovaries with small cysts.  Abnormal soft tissue density in the left posterior perianal region, measuring 7.3 x 4.0 cm in maximum dimensions on image number 97. No visible fluid collection.  Stable increased soft tissue density in the left anterior abdominal wall at the junction of the abdomen and pelvis, extending into the subcutaneous fat, compatible with a previous ostomy site.  Interval medium soft tissue density in the underlying peritoneal fat, partially surrounding the  dilated small bowel just proximal to the level of the obstruction.  Mild intrahepatic and proximal extrahepatic biliary ductal dilatation.  Otherwise, normal appearing liver, spleen, pancreas, adrenal glands, kidneys, urinary bladder and uterus.  No enlarged lymph nodes.   Small amount of linear scarring at the right lung base.  Minimal lower thoracic spine degenerative changes.  IMPRESSION:  1. Small bowel obstruction to the level of a small bowel anastomosis in the left upper pelvis.  2.  Interval scar tissue in the anterior peritoneal fat beneath a previous ostomy scar. 3.  Mildly progressive post cholecystectomy biliary ductal dilatation.  This is not felt to represent obstruction, based on a normal total bilirubin today. 4.  Left perianal soft tissue infection without abscess.  Original Report Authenticated By: Gerald Stabs, M.D.    Anti-infectives: Anti-infectives     Start     Dose/Rate Route Frequency Ordered Stop   11/08/11 1400   vancomycin (VANCOCIN) IVPB 1000 mg/200 mL premix        1,000 mg 200 mL/hr over 60 Minutes Intravenous Every 8 hours 11/08/11 1352            Assessment/Plan:   Partial small bowel obstruction. This looks more like adhesions than recurrent Crohn's disease radiographically, but both are possible. No evidence of peritoneal inflammation at this time. Clinical course shows improvement overnight. No evidence of compromised bowel.  Will continue NG suction for the next 24 hours and get abdominal films tomorrow.  If her bowel function normalizes over the next 24 hours, would consider discontinuing the NG tube tomorrow and begin liquid diet.   Consider [facility] consult, since they know her so well.   Crohn's disease. On 2 medications for Crohn's disease. Status post emergent ileocecal resection with ileostomy October 2011.(Dr. Cornett_)   Status post elective laparotomy and ileostomy closure January of 7654, complicated by postop enterocutaneous fistula which resolved with conservative therapy.(Dr,. Cornett)   Past history laparoscopic cholecystectomy   Morbid obesity   Asthma    LOS: 1 day    Brasen Bundren M. Dalbert Batman, M.D., [facility] General and Minimally invasive Surgery Breast and  Colorectal Surgery Office:   [phone] Pager:   [phone]  11/09/2011

## 2011-11-09 NOTE — Progress Notes (Signed)
PROGRESS NOTE  [name] PTW:[id number] DOB: 1990/11/28 DOA: 11/08/2011 PCP: Anita Grant, MD, MD  Brief narrative: [name] is an 21 y.o. female with with PMH of MRSA abscesses, Crohn's disease and multiple abdominal surgeries who presented to the hospital with a 2 day history of abdominal pain and who was found to have a SBO upon initial evaluation.  She has been seen by general surgery, and gastroenterology, both of whom are recommending conservative care.  Assessment/Plan: Principal Problem:  *SBO (small bowel obstruction)  Confirmed by CT scan on 11/08/11.  Seen by Dr. Dalbert [name] on 11/08/11 who recommended continuing bowel rest, and NG tube suction.  NG tube dislodged, OK to leave out unless she has recurrent N/V. Abdominal films done 11/09/11: Stable.  Repeat abdominal films in a.m. Potassium replaced. GI consultation performed by Dr. Deatra [name] on 11/09/11: SBO felt to be mechanical, hold 6-MP until able to take PO's. Active Problems:  CROHN'S DISEASE  Medications on hold.  Seen by Dr. Deatra [name] on 11/09/11. No change in current management. Hypokalemia  Replaced in IVF. Anxiety  Hold oral Ativan, give IV PRN. 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  No evidence of abscess on CT.  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  Nasal swab for PCR MRSA negative.  Chronic iron deficiency anemia  Known history of menorrhagia.  Hemoglobin stable.  Code Status: Full Family Communication: None, at bedside. Disposition Plan: Home, when stable.  Medical Consultants:  Dr. Erskine [name], Gastroenterology  Dr. Fanny [name], Surgery  Other consultants:  Pharmacy  Antibiotics:  Vancomycin 11/08/11--->   Subjective  [name] feels well.  No nausea or vomiting.  Had a bowel movement this morning, and is passing flatus.  Buttock less sore today.   Objective    Interim History: Stable overnight.   Objective: Filed Vitals:   11/08/11 1206 11/08/11  1419 11/08/11 2200 11/09/11 0443  BP: 153/83 137/82 143/79 138/90  Pulse: 87 86 90 94  Temp: 98.1 F (36.7 C) 97.9 F (36.6 C) 98 F (36.7 C) 98.5 F (36.9 C)  TempSrc: Oral Oral Oral Oral  Resp: 18 18 18 18   Height: 5' 4"  (1.626 m)     Weight: 98.884 kg (218 lb)     SpO2: 100% 100% 98% 99%    Intake/Output Summary (Last 24 hours) at 11/09/11 1418 Last data filed at 11/09/11 0817  Gross per 24 hour  Intake 2877.5 ml  Output   1400 ml  Net 1477.5 ml    Exam: Gen:  NAD Cardiovascular:  RRR, No M/R/G Respiratory: Lungs CTAB Gastrointestinal: Abdomen soft, NT/ND with normal active bowel sounds. Extremities: No C/E/C    Data Reviewed: Basic Metabolic Panel:  Lab 70/01/74 0420 11/08/11 0725  NA 137 134*  K 3.9 3.2*  CL 104 97  CO2 25 25  GLUCOSE 87 129*  BUN 4* 6  CREATININE 0.52 0.56  CALCIUM 8.6 9.4  MG -- --  PHOS -- --   GFR Estimated Creatinine Clearance: 126.1 ml/min (by C-G formula based on Cr of 0.52). Liver Function Tests:  Lab 11/08/11 0725  AST 14  ALT 10  ALKPHOS 55  BILITOT 0.4  PROT 7.9  ALBUMIN 3.4*    Lab 11/08/11 0725  LIPASE 10*  AMYLASE --    CBC:  Lab 11/09/11 0420 11/08/11 0725  WBC 8.7 14.9*  NEUTROABS 6.8 12.9*  HGB 9.7* 10.8*  HCT 31.3* 33.7*  MCV 82.6 81.0  PLT 500* 514*   Microbiology Recent Results (from  the past 240 hour(s))  MRSA PCR SCREENING     Status: Normal   Collection Time   11/08/11  2:28 PM      Component Value Range Status Comment   MRSA by PCR NEGATIVE  NEGATIVE  Final     Procedures and Diagnostic Studies:  Ct Abdomen Pelvis W Contrast 11/08/2011  IMPRESSION:  1. Small bowel obstruction to the level of a small bowel anastomosis in the left upper pelvis.  2.  Interval scar tissue in the anterior peritoneal fat beneath a previous ostomy scar. 3.  Mildly progressive post cholecystectomy biliary ductal dilatation.  This is not felt to represent obstruction, based on a normal total bilirubin today. 4.   Left perianal soft tissue infection without abscess.  Original Report Authenticated By: [name], M.D.    Dg Abd 2 Views 11/09/2011  IMPRESSION:  1.  Stable partial small bowel obstruction. 2.  Nasogastric tube tip in the proximal stomach and side hole in the distal esophagus.  Original Report Authenticated By: [name], M.D.    Scheduled Meds:   . sodium chloride   Intravenous STAT  . diphenhydrAMINE      . pantoprazole (PROTONIX) IV  40 mg Intravenous Q12H  . vancomycin  1,000 mg Intravenous Q8H   Continuous Infusions:   . 0.9 % NaCl with KCl 40 mEq / L 150 mL/hr at 11/09/11 1301      LOS: 1 day   Anita Cree, MD Pager [phone]  11/09/2011, 2:18 PM   Patient Teaching (information given to and reviewed with the patient): [name] 11/09/2011  Treatment team:   Dr. Jacquelynn [name], Hospitalist (Internist)   Dr. Fanny [name], Surgery   Dr. Erskine [name], Gastroenterology  Medical Issues and plan: Principal Problem:  *Mechanical blockage of the intestine (small bowel obstruction)  Confirmed by CT scan on 11/08/11.  Seen by Dr. Dalbert [name] on 11/08/11 who recommended continuing bowel rest, and NG tube suction.  NG tube dislodged, OK to leave out unless you have recurrent vomiting. Abdominal films done 11/09/11: Stable.  Potassium replaced. GI consultation performed by Dr. Deatra [name] on 11/09/11: SBO felt to be mechanical, hold 6-MP until able to take by mouth medicines. Active Problems:  CROHN'S DISEASE  Medications on hold.  Seen by Dr. Deatra [name] on 11/09/11. No change in current management. Low potassium  Replaced in IVF. Anxiety  Hold oral Ativan, give IV PRN. Skin infection of the buttock  History of MRSA abscesses in past.  No evidence of abscess on CT.  Vancomycin, an antibiotic that is active against MRSA, started 11/08/11.  Nasal swab for MRSA negative.  Low blood count (Chronic iron deficiency anemia)  Known history of heavy  periods.  Hemoglobin stable.  Anticipated discharge date: 2 days.

## 2011-11-09 NOTE — Consult Note (Signed)
Chart was reviewed and patient was examined. X-rays were reviewed.     Pt has a resolving SBO that appears to be mechanical rather than due to active Crohn's disease.  She also has a perirectal abscess.    I agree with management and plans.  Sandy Salaam. Deatra Ina, M.D., [facility]

## 2011-11-10 ENCOUNTER — Inpatient Hospital Stay (HOSPITAL_COMMUNITY): Payer: 59

## 2011-11-10 DIAGNOSIS — D509 Iron deficiency anemia, unspecified: Secondary | ICD-10-CM

## 2011-11-10 LAB — CBC
HCT: 31 % — ABNORMAL LOW (ref 36.0–46.0)
Hemoglobin: 9.5 g/dL — ABNORMAL LOW (ref 12.0–15.0)
MCH: 25.3 pg — ABNORMAL LOW (ref 26.0–34.0)
MCV: 82.4 fL (ref 78.0–100.0)
Platelets: 490 10*3/uL — ABNORMAL HIGH (ref 150–400)
RBC: 3.76 MIL/uL — ABNORMAL LOW (ref 3.87–5.11)
WBC: 7.2 10*3/uL (ref 4.0–10.5)

## 2011-11-10 LAB — BASIC METABOLIC PANEL
CO2: 26 mEq/L (ref 19–32)
Calcium: 8.7 mg/dL (ref 8.4–10.5)
Chloride: 103 mEq/L (ref 96–112)
Creatinine, Ser: 0.54 mg/dL (ref 0.50–1.10)
Glucose, Bld: 84 mg/dL (ref 70–99)

## 2011-11-10 MED ORDER — LORAZEPAM 1 MG PO TABS
1.0000 mg | ORAL_TABLET | Freq: Three times a day (TID) | ORAL | Status: DC | PRN
  Administered 2011-11-10 – 2011-11-16 (×11): 1 mg via ORAL
  Filled 2011-11-10 (×12): qty 1

## 2011-11-10 MED ORDER — MERCAPTOPURINE 50 MG PO TABS
50.0000 mg | ORAL_TABLET | Freq: Every day | ORAL | Status: DC
  Administered 2011-11-10 – 2011-11-16 (×6): 50 mg via ORAL
  Filled 2011-11-10 (×7): qty 1

## 2011-11-10 MED ORDER — PIPERACILLIN-TAZOBACTAM 3.375 G IVPB
3.3750 g | Freq: Three times a day (TID) | INTRAVENOUS | Status: DC
  Administered 2011-11-10 – 2011-11-13 (×9): 3.375 g via INTRAVENOUS
  Filled 2011-11-10 (×10): qty 50

## 2011-11-10 MED ORDER — OXYCODONE-ACETAMINOPHEN 5-325 MG PO TABS
1.0000 | ORAL_TABLET | ORAL | Status: DC | PRN
  Administered 2011-11-10 – 2011-11-15 (×9): 2 via ORAL
  Filled 2011-11-10 (×9): qty 2

## 2011-11-10 MED ORDER — MERCAPTOPURINE 50 MG PO TABS: 50.0000 mg | ORAL_TABLET | Freq: Every day | ORAL | Status: DC

## 2011-11-10 NOTE — Progress Notes (Signed)
Subjective Feeling much better, pt removed her NG tube yesterday  and has done well, she had 1 BM  last night,  The perineal induration has come back  Objective: Vital signs in last 24 hours: Temp:  [98 F (36.7 C)-99.3 F (37.4 C)] 99.3 F (37.4 C) (04/29 0500) Pulse Rate:  [84-93] 84  (04/29 0500) Resp:  [18] 18  (04/29 0500) BP: (116-141)/(77-90) 140/77 mmHg (04/29 0500) SpO2:  [100 %] 100 % (04/29 0500) Last BM Date: 11/09/11 General:   Alert,  pleasant, cooperative in NAD Head:  Normocephalic and atraumatic. Eyes:  Sclera clear, no icterus.   Conjunctiva pink. Mouth:  No deformity or lesions, dentition normal. Neck:  Supple; no masses or thyromegaly. Heart:  Regular rate and rhythm; no murmurs, clicks, rubs,  or gallops. Lungs:  No wheezes or rales Abdomen:  Active bowl sounds, very tender upper abdomen, no distention  Msk:  Symmetrical without gross deformities. Normal posture. Pulses:  Normal pulses noted. Extremities:  Without clubbing or edema. Neurologic:  Alert and  oriented x4;  grossly normal neurologically. Skin:  Intact without significant lesions or rashes. Rectum: tender induration of about 3 cm in diameter  On the left side of the perirectal area, no drainage, Intake/Output from previous day: 04/28 0701 - 04/29 0700 In: 3348.3 [I.V.:2848.3; IV Piggyback:500] Out: 1700 [Urine:1700] Intake/Output this shift: Total I/O In: -  Out: 1150 [Urine:1150]  Lab Results:  [facility] 11/10/11 0426 11/09/11 0420 11/08/11 0725  WBC 7.2 8.7 14.9*  HGB 9.5* 9.7* 10.8*  HCT 31.0* 31.3* 33.7*  PLT 490* 500* 514*   BMET  Basename 11/10/11 0426 11/09/11 0420 11/08/11 0725  NA 137 137 134*  K 4.0 3.9 3.2*  CL 103 104 97  CO2 26 25 25   GLUCOSE 84 87 129*  BUN <3* 4* 6  CREATININE 0.54 0.52 0.56  CALCIUM 8.7 8.6 9.4   LFT  Basename 11/08/11 0725  PROT 7.9  ALBUMIN 3.4*  AST 14  ALT 10  ALKPHOS 55  BILITOT 0.4  BILIDIR --  IBILI --   PT/INR No results  found for this basename: LABPROT:2,INR:2 in the last 72 hours Hepatitis Panel No results found for this basename: HEPBSAG,HCVAB,HEPAIGM,HEPBIGM in the last 72 hours  Studies/Results: Ct Abdomen Pelvis W Contrast  11/08/2011  *RADIOLOGY REPORT*  Clinical Data: Abdominal pain.  Vomiting.  Rectal boil.  Previous MRSA infections.  History of Crohn's disease with enterocutaneous fistula.  Previous left ovarian cyst, small bowel resection, fistula repair, cholecystectomy and appendectomy.  Leukocytosis.  CT ABDOMEN AND PELVIS WITH CONTRAST  Technique:  Multidetector CT imaging of the abdomen and pelvis was performed following the standard protocol during bolus administration of intravenous contrast.  Contrast: 120m OMNIPAQUE IOHEXOL 300 MG/ML  SOLN  Comparison: Previous examinations, including the pelvic ultrasound dated 02/05/2011 and abdomen and pelvis CT dated 02/05/2011.  Findings: Cholecystectomy clips.  Anterior midline surgical scar with stable underlying high density material, possibly representing suture material.  Multiple mildly to moderately dilated proximal and mid small bowel loops with normal caliber distal small bowel.  Small bowel anastomosis in the upper left abdomen. The dilated small bowel extends to the level of the in anastomosis on the left and is normal caliber distal to the anastomosis.  There is fecal like material in the dilated small bowel at and proximal to the anastomosis.  The distal small bowel is normal in caliber to the level of an anastomosis to the colon in the right upper pelvis.  Small  to moderate amount of free peritoneal fluid in the pelvic cul- de-sac containing probable ovaries with small cysts.  Abnormal soft tissue density in the left posterior perianal region, measuring 7.3 x 4.0 cm in maximum dimensions on image number 97. No visible fluid collection.  Stable increased soft tissue density in the left anterior abdominal wall at the junction of the abdomen and pelvis,  extending into the subcutaneous fat, compatible with a previous ostomy site.  Interval medium soft tissue density in the underlying peritoneal fat, partially surrounding the dilated small bowel just proximal to the level of the obstruction.  Mild intrahepatic and proximal extrahepatic biliary ductal dilatation.  Otherwise, normal appearing liver, spleen, pancreas, adrenal glands, kidneys, urinary bladder and uterus.  No enlarged lymph nodes.  Small amount of linear scarring at the right lung base.  Minimal lower thoracic spine degenerative changes.  IMPRESSION:  1. Small bowel obstruction to the level of a small bowel anastomosis in the left upper pelvis.  2.  Interval scar tissue in the anterior peritoneal fat beneath a previous ostomy scar. 3.  Mildly progressive post cholecystectomy biliary ductal dilatation.  This is not felt to represent obstruction, based on a normal total bilirubin today. 4.  Left perianal soft tissue infection without abscess.  Original Report Authenticated By: Gerald Stabs, M.D.   Dg Abd 2 Views  11/10/2011  *RADIOLOGY REPORT*  Clinical Data: Small bowel obstruction with Crohn's disease.  ABDOMEN - 2 VIEW  Comparison: 11/09/2011 and CT of 11/08/2011.  Findings: Upright and supine views.  Upright view demonstrates no free intraperitoneal air.  Resolution of previously described small bowel air fluid levels.  Removal of nasogastric tube.  Right upper quadrant surgical clips.  Supine views demonstrate resolution of small bowel dilatation.  No pneumatosis.  Minimal distal gas.  Phleboliths in the pelvis.  IMPRESSION: Resolution of small bowel obstruction.  Removal of nasogastric tube.  Original Report Authenticated By: Areta Haber, M.D.   Dg Abd 2 Views  11/09/2011  *RADIOLOGY REPORT*  Clinical Data: Follow-up small bowel obstruction.  History of Crohn's disease and bowel resection.  ABDOMEN - 2 VIEW  Comparison: CT obtained yesterday.  Findings: No significant change in dilated  small bowel loops without free peritoneal air.  Interval passage of the oral contrast into normal-caliber colon.  Nasogastric tube tip in the proximal stomach and side hole in the distal esophagus.  Cholecystectomy clips.  Minimal scoliosis.  IMPRESSION:  1.  Stable partial small bowel obstruction. 2.  Nasogastric tube tip in the proximal stomach and side hole in the distal esophagus.  Original Report Authenticated By: Gerald Stabs, M.D.     ASSESSMENT:   Principal Problem:  *SBO (small bowel obstruction) Active Problems:  ANEMIA, IRON DEFICIENCY, CHRONIC  CROHN'S DISEASE  Hypokalemia  Anxiety  Cellulitis     PLAN:   SBO resolved- advance to clear liquids and as tolerated Perirectal Crohn's - would like to add Gram negative coverage to cover for fistulizing gram negative flora - Zosyn Resume 6MT Hold Humira till infection resolved         LOS: 2 days   [name]  11/10/2011, 8:33 AM

## 2011-11-10 NOTE — Progress Notes (Signed)
UR complete 

## 2011-11-10 NOTE — Progress Notes (Signed)
Agree with above, can advance diet as tolerated.

## 2011-11-10 NOTE — Progress Notes (Signed)
Subjective: Feeling better +BM, yesterday x 2.  She took out her NG.  DR. [name] is in room with her when I arrived. Left buttocks still tender and some drainage from rectum which is minimal, but bloody. Objective: Vital signs in last 24 hours: Temp:  [98 F (36.7 C)-99.3 F (37.4 C)] 99.3 F (37.4 C) (04/29 0500) Pulse Rate:  [84-93] 84  (04/29 0500) Resp:  [18] 18  (04/29 0500) BP: (116-141)/(77-90) 140/77 mmHg (04/29 0500) SpO2:  [100 %] 100 % (04/29 0500) Last BM Date: 11/09/11 Afebrile, VSS, BP up at times, two stools recorded yesterday, labs stable, many bacteria in urine Intake/Output from previous day: 04/28 0701 - 04/29 0700 In: 3348.3 [I.V.:2848.3; IV Piggyback:500] Out: 1700 [Urine:1700] Intake/Output this shift: Total I/O In: -  Out: 600 [Urine:600]  General appearance: alert, cooperative and no distress GI: soft, +BS, + flatus, +BM.  Still tender, not distended. Abdominal incisions all look good, well healed. Buttocks on Left  perirectal area, tender and some erythema.  She had a couple drops of bloody fluid coming from rectum adjacent to this areal  Lab Results:   Basename 11/10/11 0426 11/09/11 0420  WBC 7.2 8.7  HGB 9.5* 9.7*  HCT 31.0* 31.3*  PLT 490* 500*    BMET  Basename 11/10/11 0426 11/09/11 0420  NA 137 137  K 4.0 3.9  CL 103 104  CO2 26 25  GLUCOSE 84 87  BUN <3* 4*  CREATININE 0.54 0.52  CALCIUM 8.7 8.6   PT/INR No results found for this basename: LABPROT:2,INR:2 in the last 72 hours   Lab 11/08/11 0725  AST 14  ALT 10  ALKPHOS 55  BILITOT 0.4  PROT 7.9  ALBUMIN 3.4*     Lipase     Component Value Date/Time   LIPASE 10* 11/08/2011 0725     Studies/Results: Ct Abdomen Pelvis W Contrast  11/08/2011  *RADIOLOGY REPORT*  Clinical Data: Abdominal pain.  Vomiting.  Rectal boil.  Previous MRSA infections.  History of Crohn's disease with enterocutaneous fistula.  Previous left ovarian cyst, small bowel resection, fistula  repair, cholecystectomy and appendectomy.  Leukocytosis.  CT ABDOMEN AND PELVIS WITH CONTRAST  Technique:  Multidetector CT imaging of the abdomen and pelvis was performed following the standard protocol during bolus administration of intravenous contrast.  Contrast: 141m OMNIPAQUE IOHEXOL 300 MG/ML  SOLN  Comparison: Previous examinations, including the pelvic ultrasound dated 02/05/2011 and abdomen and pelvis CT dated 02/05/2011.  Findings: Cholecystectomy clips.  Anterior midline surgical scar with stable underlying high density material, possibly representing suture material.  Multiple mildly to moderately dilated proximal and mid small bowel loops with normal caliber distal small bowel.  Small bowel anastomosis in the upper left abdomen. The dilated small bowel extends to the level of the in anastomosis on the left and is normal caliber distal to the anastomosis.  There is fecal like material in the dilated small bowel at and proximal to the anastomosis.  The distal small bowel is normal in caliber to the level of an anastomosis to the colon in the right upper pelvis.  Small to moderate amount of free peritoneal fluid in the pelvic cul- de-sac containing probable ovaries with small cysts.  Abnormal soft tissue density in the left posterior perianal region, measuring 7.3 x 4.0 cm in maximum dimensions on image number 97. No visible fluid collection.  Stable increased soft tissue density in the left anterior abdominal wall at the junction of the abdomen and pelvis, extending into  the subcutaneous fat, compatible with a previous ostomy site.  Interval medium soft tissue density in the underlying peritoneal fat, partially surrounding the dilated small bowel just proximal to the level of the obstruction.  Mild intrahepatic and proximal extrahepatic biliary ductal dilatation.  Otherwise, normal appearing liver, spleen, pancreas, adrenal glands, kidneys, urinary bladder and uterus.  No enlarged lymph nodes.  Small  amount of linear scarring at the right lung base.  Minimal lower thoracic spine degenerative changes.  IMPRESSION:  1. Small bowel obstruction to the level of a small bowel anastomosis in the left upper pelvis.  2.  Interval scar tissue in the anterior peritoneal fat beneath a previous ostomy scar. 3.  Mildly progressive post cholecystectomy biliary ductal dilatation.  This is not felt to represent obstruction, based on a normal total bilirubin today. 4.  Left perianal soft tissue infection without abscess.  Original Report Authenticated By: Gerald Stabs, M.D.   Dg Abd 2 Views  11/10/2011  *RADIOLOGY REPORT*  Clinical Data: Small bowel obstruction with Crohn's disease.  ABDOMEN - 2 VIEW  Comparison: 11/09/2011 and CT of 11/08/2011.  Findings: Upright and supine views.  Upright view demonstrates no free intraperitoneal air.  Resolution of previously described small bowel air fluid levels.  Removal of nasogastric tube.  Right upper quadrant surgical clips.  Supine views demonstrate resolution of small bowel dilatation.  No pneumatosis.  Minimal distal gas.  Phleboliths in the pelvis.  IMPRESSION: Resolution of small bowel obstruction.  Removal of nasogastric tube.  Original Report Authenticated By: Areta Haber, M.D.   Dg Abd 2 Views  11/09/2011  *RADIOLOGY REPORT*  Clinical Data: Follow-up small bowel obstruction.  History of Crohn's disease and bowel resection.  ABDOMEN - 2 VIEW  Comparison: CT obtained yesterday.  Findings: No significant change in dilated small bowel loops without free peritoneal air.  Interval passage of the oral contrast into normal-caliber colon.  Nasogastric tube tip in the proximal stomach and side hole in the distal esophagus.  Cholecystectomy clips.  Minimal scoliosis.  IMPRESSION:  1.  Stable partial small bowel obstruction. 2.  Nasogastric tube tip in the proximal stomach and side hole in the distal esophagus.  Original Report Authenticated By: Gerald Stabs, M.D.     Medications:    . diphenhydrAMINE      . pantoprazole (PROTONIX) IV  40 mg Intravenous Q12H  . vancomycin  1,250 mg Intravenous Q8H  . DISCONTD: vancomycin  1,000 mg Intravenous Q8H    Assessment/Plan 1.  PSBO vs Recurrent Crohn's disease; Soft tissue density, left posterior perianal area, 7.3 x 4cm, without evidence of abscess. 2. 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 3. Hx of appendectomy 4. Asthma 5. BMI 37.5   Plan:  She is opening up.  Dr. [name] is seeing her and she is going to get her back on Crohn's meds, and broaden antibiotic coverage with Zosyn to cover GM neg organism.  Will defer to GI, and follow with you. No need for surgery right now.    LOS: 2 days    [name] 11/10/2011

## 2011-11-10 NOTE — Progress Notes (Addendum)
PROGRESS NOTE  [name] RFV:[id number] DOB: 1990/10/25 DOA: 11/08/2011 PCP: Gwendolyn Grant, MD, MD  Brief narrative: [name] is an 21 y.o. female with with PMH of MRSA abscesses, Crohn's disease and multiple abdominal surgeries who presented to the hospital with a 2 day history of abdominal pain and who was found to have a SBO upon initial evaluation.  She has been seen by general surgery, and gastroenterology, both of whom are recommending conservative care.  Assessment/Plan: Principal Problem:  *SBO (small bowel obstruction)  Confirmed by CT scan on 11/08/11.  Seen by Dr. Dalbert Batman on 11/08/11 who recommended continuing bowel rest, and NG tube suction.  NG tube dislodged, OK to leave out unless she has recurrent N/V. Abdominal films done 11/09/11: Stable; Repeated 11/10/11: Showed SBO resolution. Potassium replaced. GI consultation performed by Dr. Deatra Ina on 11/09/11: SBO felt to be mechanical, hold 6-MP until able to take PO's. Diet advanced to clears 11/10/11 and 6-MP resumed. Active Problems:  Cutter on hold, 6-MP resumed today. Seen by Dr. Deatra Ina on 11/09/11, Dr. [name] now following. Hypokalemia  Replaced in IVF. Anxiety  Hold oral Ativan, give IV PRN. 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  No evidence of abscess on CT.  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  Nasal swab for PCR MRSA negative. 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  Chronic iron deficiency anemia  Known history of menorrhagia.  Hemoglobin stable.  Code Status: Full Family Communication: None, at bedside. Disposition Plan: Home, when stable.  Medical Consultants:  Dr. Erskine Emery and Dr. [name], Gastroenterology  Dr. Fanny Skates, Surgery  Other consultants:  Pharmacy  Antibiotics:  Vancomycin 11/08/11--->  Zosyn 11/10/11--->   Subjective  Ms. Lechuga feels well.  No nausea or  vomiting.  Had a bowel movement last night.  Perianal induration and pain returned.   Objective    Interim History: Stable overnight.   Objective: Filed Vitals:   11/09/11 0443 11/09/11 1451 11/09/11 2100 11/10/11 0500  BP: 138/90 116/89 141/90 140/77  Pulse: 94 84 93 84  Temp: 98.5 F (36.9 C) 98 F (36.7 C) 99.1 F (37.3 C) 99.3 F (37.4 C)  TempSrc: Oral Oral Oral Oral  Resp: 18 18 18 18   Height:      Weight:      SpO2: 99% 100% 100% 100%    Intake/Output Summary (Last 24 hours) at 11/10/11 1230 Last data filed at 11/10/11 1048  Gross per 24 hour  Intake 3348.34 ml  Output   2800 ml  Net 548.34 ml    Exam: Gen:  NAD Cardiovascular:  RRR, No M/R/G Respiratory: Lungs CTAB Gastrointestinal: Abdomen soft, NT/ND with normal active bowel sounds. Extremities: No C/E/C    Data Reviewed: Basic Metabolic Panel:  Lab 40/35/24 0426 11/09/11 0420 11/08/11 0725  NA 137 137 134*  K 4.0 3.9 --  CL 103 104 97  CO2 26 25 25   GLUCOSE 84 87 129*  BUN <3* 4* 6  CREATININE 0.54 0.52 0.56  CALCIUM 8.7 8.6 9.4  MG -- -- --  PHOS -- -- --   GFR Estimated Creatinine Clearance: 126.1 ml/min (by C-G formula based on Cr of 0.54). Liver Function Tests:  Lab 11/08/11 0725  AST 14  ALT 10  ALKPHOS 55  BILITOT 0.4  PROT 7.9  ALBUMIN 3.4*    Lab 11/08/11 0725  LIPASE 10*  AMYLASE --    CBC:  Lab 11/10/11 0426 11/09/11  3748 11/08/11 0725  WBC 7.2 8.7 14.9*  NEUTROABS -- 6.8 12.9*  HGB 9.5* 9.7* 10.8*  HCT 31.0* 31.3* 33.7*  MCV 82.4 82.6 81.0  PLT 490* 500* 514*   Microbiology Recent Results (from the past 240 hour(s))  URINE CULTURE     Status: Normal   Collection Time   11/08/11  8:43 AM      Component Value Range Status Comment   Specimen Description URINE, CLEAN CATCH   Final    Special Requests NONE   Final    Culture  Setup Time [id number]   Final    Colony Count 50,000 COLONIES/ML   Final    Culture     Final    Value: Multiple bacterial  morphotypes present, none predominant. Suggest appropriate recollection if clinically indicated.   Report Status 11/09/2011 FINAL   Final   MRSA PCR SCREENING     Status: Normal   Collection Time   11/08/11  2:28 PM      Component Value Range Status Comment   MRSA by PCR NEGATIVE  NEGATIVE  Final     Procedures and Diagnostic Studies:  Ct Abdomen Pelvis W Contrast 11/08/2011  IMPRESSION:  1. Small bowel obstruction to the level of a small bowel anastomosis in the left upper pelvis.  2.  Interval scar tissue in the anterior peritoneal fat beneath a previous ostomy scar. 3.  Mildly progressive post cholecystectomy biliary ductal dilatation.  This is not felt to represent obstruction, based on a normal total bilirubin today. 4.  Left perianal soft tissue infection without abscess.  Original Report Authenticated By: Gerald Stabs, M.D.    Dg Abd 2 Views 11/09/2011  IMPRESSION:  1.  Stable partial small bowel obstruction. 2.  Nasogastric tube tip in the proximal stomach and side hole in the distal esophagus.  Original Report Authenticated By: Gerald Stabs, M.D.    Dg Abd 2 Views 11/10/2011 IMPRESSION: Resolution of small bowel obstruction.  Removal of nasogastric tube.  Original Report Authenticated By: Areta Haber, M.D.   Scheduled Meds:    . mercaptopurine  50 mg Oral Daily  . pantoprazole (PROTONIX) IV  40 mg Intravenous Q12H  . piperacillin-tazobactam (ZOSYN)  IV  3.375 g Intravenous Q8H  . vancomycin  1,250 mg Intravenous Q8H  . DISCONTD: vancomycin  1,000 mg Intravenous Q8H   Continuous Infusions:    . 0.9 % NaCl with KCl 40 mEq / L 100 mL/hr at 11/10/11 0223      LOS: 2 days   Jacquelynn Cree, MD Pager [phone]  11/10/2011, 12:30 PM   Patient Teaching (information given to and reviewed with the patient): Atianna Haidar 11/10/2011  Treatment team:   Dr. Jacquelynn Cree, Hospitalist (Internist)   Dr. Fanny Skates, Surgery   Dr. Erskine Emery and Dr.  [name], Gastroenterology  Medical Issues and plan: Principal Problem:  *Mechanical blockage of the intestine (small bowel obstruction)  Confirmed by CT scan on 11/08/11.  Seen by Dr. Dalbert Batman on 11/08/11 who recommended continuing bowel rest, and NG tube suction.  Abdominal films done 11/09/11: Stable.  Potassium replaced. GI consultation performed by Dr. Deatra Ina on 11/09/11: SBO felt to be mechanical, hold 6-MP until able to take by mouth medicines. Resolving with conservative care: Started on a clear liquid diet today. Active Problems:  CROHN'S DISEASE  Resume 6-MP, Humera on hold. Low potassium  Replaced. Anxiety  Resume oral Ativan as needed. Skin infection of the buttock  History of MRSA  abscesses in past.  No evidence of abscess on CT.  Vancomycin, an antibiotic that is active against MRSA, started 11/08/11.  Nasal swab for MRSA negative. Zosyn, another antibiotic, added 11/10/11.  Low blood count (Chronic iron deficiency anemia)  Known history of heavy periods.  Hemoglobin stable.  Anticipated discharge date: 1-2 days.

## 2011-11-11 ENCOUNTER — Encounter (HOSPITAL_COMMUNITY): Payer: Self-pay | Admitting: *Deleted

## 2011-11-11 ENCOUNTER — Encounter (HOSPITAL_COMMUNITY)
Admission: EM | Disposition: A | Discharge status: Home / Self Care | Payer: Self-pay | Source: Home / Self Care | Attending: Internal Medicine

## 2011-11-11 DIAGNOSIS — K263 Acute duodenal ulcer without hemorrhage or perforation: Secondary | ICD-10-CM

## 2011-11-11 HISTORY — PX: ESOPHAGOGASTRODUODENOSCOPY: SHX5428

## 2011-11-11 LAB — DIFFERENTIAL
Eosinophils Absolute: 0.1 10*3/uL (ref 0.0–0.7)
Eosinophils Relative: 1 % (ref 0–5)
Lymphs Abs: 1.8 10*3/uL (ref 0.7–4.0)
Monocytes Absolute: 1 10*3/uL (ref 0.1–1.0)
Monocytes Relative: 9 % (ref 3–12)

## 2011-11-11 LAB — CBC
Hemoglobin: 9.6 g/dL — ABNORMAL LOW (ref 12.0–15.0)
MCH: 25.9 pg — ABNORMAL LOW (ref 26.0–34.0)
MCV: 80.8 fL (ref 78.0–100.0)
RBC: 3.7 MIL/uL — ABNORMAL LOW (ref 3.87–5.11)

## 2011-11-11 SURGERY — EGD (ESOPHAGOGASTRODUODENOSCOPY)
Anesthesia: Moderate Sedation

## 2011-11-11 MED ORDER — MIDAZOLAM HCL 10 MG/2ML IJ SOLN
INTRAMUSCULAR | Status: DC | PRN
  Administered 2011-11-11 (×4): 2.5 mg via INTRAVENOUS
  Administered 2011-11-11: 2 mg via INTRAVENOUS

## 2011-11-11 MED ORDER — DIPHENHYDRAMINE HCL 50 MG/ML IJ SOLN
INTRAMUSCULAR | Status: DC | PRN
  Administered 2011-11-11: 25 mg via INTRAVENOUS

## 2011-11-11 MED ORDER — BUTAMBEN-TETRACAINE-BENZOCAINE 2-2-14 % EX AERO
INHALATION_SPRAY | CUTANEOUS | Status: DC | PRN
  Administered 2011-11-11: 2 via TOPICAL

## 2011-11-11 MED ORDER — FENTANYL CITRATE 0.05 MG/ML IJ SOLN
INTRAMUSCULAR | Status: AC
  Filled 2011-11-11: qty 4

## 2011-11-11 MED ORDER — FENTANYL NICU IV SYRINGE 50 MCG/ML
INJECTION | INTRAMUSCULAR | Status: DC | PRN
  Administered 2011-11-11 (×5): 25 ug via INTRAVENOUS

## 2011-11-11 MED ORDER — DIPHENHYDRAMINE HCL 50 MG/ML IJ SOLN
INTRAMUSCULAR | Status: AC
  Filled 2011-11-11: qty 1

## 2011-11-11 MED ORDER — MIDAZOLAM HCL 10 MG/2ML IJ SOLN
INTRAMUSCULAR | Status: AC
  Filled 2011-11-11: qty 4

## 2011-11-11 MED ORDER — LORAZEPAM 2 MG/ML IJ SOLN
0.5000 mg | Freq: Once | INTRAMUSCULAR | Status: AC
  Administered 2011-11-11: 0.5 mg via INTRAVENOUS
  Filled 2011-11-11: qty 1

## 2011-11-11 NOTE — Progress Notes (Signed)
IV infiltrated. Attempted x2 to start new IV. IV team notified and paged to start new IV. Awaiting access.

## 2011-11-11 NOTE — Progress Notes (Signed)
Agree with above, egd pending

## 2011-11-11 NOTE — Progress Notes (Signed)
PROGRESS NOTE  [name] URK:[id number] DOB: 1991-01-15 DOA: 11/08/2011 PCP: Anita Grant, MD, MD  Brief narrative: [name] is an 21 y.o. female with with PMH of MRSA abscesses, Crohn's disease and multiple abdominal surgeries who presented to the hospital with a 2 day history of abdominal pain and who was found to have a SBO upon initial evaluation.  She has been seen by general surgery, and gastroenterology, both of whom are recommending conservative care.  Assessment/Plan: Principal Problem:  *SBO (small bowel obstruction)  Confirmed by CT scan on 11/08/11.  Seen by Dr. Dalbert Batman on 11/08/11 who recommended continuing bowel rest, and NG tube suction.  NG tube dislodged 11/09/11, OK to leave out unless she has recurrent N/V. Abdominal films done 11/09/11: Stable; Repeated 11/10/11: Showed SBO resolution. Potassium replaced. GI consultation performed by Dr. Deatra Ina on 11/09/11: SBO felt to be mechanical, hold 6-MP until able to take PO's. Diet advanced to clears 11/10/11 and 6-MP resumed. Taken for EGD 11/11/11.  Normal.  No evidence of active Crohn's disease. Advance to [place] diet in a.m. Active Problems:  Ferdinand on hold, 6-MP resumed today. Seen by Dr. Deatra Ina on 11/09/11, Dr. [name] now following. Hypokalemia  Replaced in IVF. Anxiety  Hold oral Ativan, give IV PRN. 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  No evidence of abscess on CT.  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  Nasal swab for PCR MRSA negative. 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 Will D/C Vancomycin per surgery's recommendations.  Chronic iron deficiency anemia  Known history of menorrhagia.  Hemoglobin stable.  Code Status: Full Family Communication: None, at bedside. Disposition Plan: Home, when stable.  Medical Consultants:  Dr. Erskine Emery and Dr. [name], Gastroenterology  Dr. Fanny Skates,  Surgery  Other consultants:  Pharmacy  Antibiotics:  Vancomycin 11/08/11--->  Zosyn 11/10/11--->   Subjective  [name] continues to have rectal pain.  Bowels moving.  No nausea or vomiting.   Objective    Interim History: Slipped and fell on a leaking IV last night, but no noted injury.   Objective: Filed Vitals:   11/11/11 1300 11/11/11 1310 11/11/11 1320 11/11/11 1400  BP: 123/64 122/70 120/62 139/86  Pulse:    88  Temp:    98.1 F (36.7 C)  TempSrc:    Oral  Resp: 28 10 26 20   Height:      Weight:      SpO2: 100% 98% 99% 100%    Intake/Output Summary (Last 24 hours) at 11/11/11 1647 Last data filed at 11/11/11 0600  Gross per 24 hour  Intake 2666.67 ml  Output    400 ml  Net 2266.67 ml    Exam: Gen:  NAD Cardiovascular:  RRR, No M/R/G Respiratory: Lungs CTAB Gastrointestinal: Abdomen soft, NT/ND with normal active bowel sounds. Extremities: No C/E/C    Data Reviewed: Basic Metabolic Panel:  Lab 83/15/17 0426 11/09/11 0420 11/08/11 0725  NA 137 137 134*  K 4.0 3.9 --  CL 103 104 97  CO2 26 25 25   GLUCOSE 84 87 129*  BUN <3* 4* 6  CREATININE 0.54 0.52 0.56  CALCIUM 8.7 8.6 9.4  MG -- -- --  PHOS -- -- --   GFR Estimated Creatinine Clearance: 126.1 ml/min (by C-G formula based on Cr of 0.54). Liver Function Tests:  Lab 11/08/11 0725  AST 14  ALT 10  ALKPHOS 55  BILITOT 0.4  PROT 7.9  ALBUMIN  3.4*    Lab 11/08/11 0725  LIPASE 10*  AMYLASE --    CBC:  Lab 11/11/11 0450 11/10/11 0426 11/09/11 0420 11/08/11 0725  WBC 11.0* 7.2 8.7 14.9*  NEUTROABS 8.1* -- 6.8 12.9*  HGB 9.6* 9.5* 9.7* 10.8*  HCT 29.9* 31.0* 31.3* 33.7*  MCV 80.8 82.4 82.6 81.0  PLT 460* 490* 500* 514*   Microbiology Recent Results (from the past 240 hour(s))  URINE CULTURE     Status: Normal   Collection Time   11/08/11  8:43 AM      Component Value Range Status Comment   Specimen Description URINE, CLEAN CATCH   Final    Special Requests NONE   Final     Culture  Setup Time [id number]   Final    Colony Count 50,000 COLONIES/ML   Final    Culture     Final    Value: Multiple bacterial morphotypes present, none predominant. Suggest appropriate recollection if clinically indicated.   Report Status 11/09/2011 FINAL   Final   MRSA PCR SCREENING     Status: Normal   Collection Time   11/08/11  2:28 PM      Component Value Range Status Comment   MRSA by PCR NEGATIVE  NEGATIVE  Final     Procedures and Diagnostic Studies:  Ct Abdomen Pelvis W Contrast 11/08/2011  IMPRESSION:  1. Small bowel obstruction to the level of a small bowel anastomosis in the left upper pelvis.  2.  Interval scar tissue in the anterior peritoneal fat beneath a previous ostomy scar. 3.  Mildly progressive post cholecystectomy biliary ductal dilatation.  This is not felt to represent obstruction, based on a normal total bilirubin today. 4.  Left perianal soft tissue infection without abscess.  Original Report Authenticated By: Gerald Stabs, M.D.    Dg Abd 2 Views 11/09/2011  IMPRESSION:  1.  Stable partial small bowel obstruction. 2.  Nasogastric tube tip in the proximal stomach and side hole in the distal esophagus.  Original Report Authenticated By: Gerald Stabs, M.D.    Dg Abd 2 Views 11/10/2011 IMPRESSION: Resolution of small bowel obstruction.  Removal of nasogastric tube.  Original Report Authenticated By: Areta Haber, M.D.   Scheduled Meds:    . LORazepam  0.5 mg Intravenous Once  . mercaptopurine  50 mg Oral Daily  . pantoprazole (PROTONIX) IV  40 mg Intravenous Q12H  . piperacillin-tazobactam (ZOSYN)  IV  3.375 g Intravenous Q8H  . vancomycin  1,250 mg Intravenous Q8H   Continuous Infusions:    . 0.9 % NaCl with KCl 40 mEq / L 100 mL/hr at 11/11/11 0100      LOS: 3 days   Jacquelynn Cree, MD Pager [phone]  11/11/2011, 4:47 PM   Patient Teaching (information given to and reviewed with the patient): [name] 11/11/2011  Treatment team:   Dr. Jacquelynn Cree, Hospitalist (Internist)   Dr. Fanny Skates, Surgery   Dr. Erskine Emery and Dr. [name], Gastroenterology  Medical Issues and plan: Principal Problem:  *Mechanical blockage of the intestine (small bowel obstruction)  Confirmed by CT scan on 11/08/11.  Seen by Dr. Dalbert Batman on 11/08/11 who recommended continuing bowel rest, and NG tube suction.  Abdominal films done 11/09/11: Stable.  Potassium replaced. GI consultation performed by Dr. Deatra Ina on 11/09/11: SBO felt to be mechanical, hold 6-MP until able to take by mouth medicines. Resolving with conservative care: Started on a clear liquid diet today. Upper endoscopy  done by Dr. [name] 11/11/11: Normal. Active Problems:  CROHN'S DISEASE  Resumed 6-MP, Humera on hold. Low potassium  Replaced. Anxiety  Resume oral Ativan as needed. Skin infection of the buttock  History of MRSA abscesses in past.  No evidence of abscess on CT.  Vancomycin, an antibiotic that is active against MRSA, started 11/08/11. Will stop today since no evidence of MRSA. Nasal swab for MRSA negative. Zosyn, another antibiotic, added 11/10/11, which we will continue for now.  Low blood count (Chronic iron deficiency anemia)  Known history of heavy periods.  Hemoglobin stable.  Anticipated discharge date: 1-2 days.

## 2011-11-11 NOTE — Progress Notes (Signed)
Patient ID: [name], female   DOB: Apr 29, 1991, 21 y.o.   MRN: [id number]    Subjective: She reports she is continuing to feel better. She is passing flatus. No BM's since yesterday am.  She is going for EGD today per Dr. [name]. Objective: Vital signs in last 24 hours: Temp:  [98.3 F (36.8 C)-100.4 F (38 C)] 100.4 F (38 C) (04/30 0545) Pulse Rate:  [87-110] 98  (04/30 0545) Resp:  [18-20] 18  (04/30 0545) BP: (120-151)/(83-100) 120/86 mmHg (04/30 0545) SpO2:  [96 %-99 %] 96 % (04/30 0545) Last BM Date: 11/09/11  Intake/Output from previous day: 2022-11-29 0701 - 04/30 0700 In: 3066.7 [P.O.:660; I.V.:1506.7; IV Piggyback:900] Out: 1800 [Urine:1800] Intake/Output this shift:    General appearance: alert, cooperative and no distress GI: soft, +BS, + flatus,Still tender in epigastrium, but not distended. Abdominal incisions all look good, well healed. Buttocks on Left  perirectal area, tender and some erythema, but no induration or discrete abscess. Lab Results:   Basename 11/11/11 0450 Nov 29, 2011 0426  WBC 11.0* 7.2  HGB 9.6* 9.5*  HCT 29.9* 31.0*  PLT 460* 490*    BMET  Basename 2011/11/29 0426 11/09/11 0420  NA 137 137  K 4.0 3.9  CL 103 104  CO2 26 25  GLUCOSE 84 87  BUN <3* 4*  CREATININE 0.54 0.52  CALCIUM 8.7 8.6   PT/INR No results found for this basename: LABPROT:2,INR:2 in the last 72 hours   Lab 11/08/11 0725  AST 14  ALT 10  ALKPHOS 55  BILITOT 0.4  PROT 7.9  ALBUMIN 3.4*     Lipase     Component Value Date/Time   LIPASE 10* 11/08/2011 0725     Studies/Results: Dg Abd 2 Views  Nov 29, 2011  *RADIOLOGY REPORT*  Clinical Data: Small bowel obstruction with Crohn's disease.  ABDOMEN - 2 VIEW  Comparison: 11/09/2011 and CT of 11/08/2011.  Findings: Upright and supine views.  Upright view demonstrates no free intraperitoneal air.  Resolution of previously described small bowel air fluid levels.  Removal of nasogastric tube.  Right upper quadrant  surgical clips.  Supine views demonstrate resolution of small bowel dilatation.  No pneumatosis.  Minimal distal gas.  Phleboliths in the pelvis.  IMPRESSION: Resolution of small bowel obstruction.  Removal of nasogastric tube.  Original Report Authenticated By: Areta Haber, M.D.    Medications:    . LORazepam  0.5 mg Intravenous Once  . mercaptopurine  50 mg Oral Daily  . pantoprazole (PROTONIX) IV  40 mg Intravenous Q12H  . piperacillin-tazobactam (ZOSYN)  IV  3.375 g Intravenous Q8H  . vancomycin  1,250 mg Intravenous Q8H  . DISCONTD: mercaptopurine  50 mg Oral Daily    Assessment/Plan 1.  PSBO vs Recurrent Crohn's disease; Soft tissue density, left posterior perianal area, 7.3 x 4cm, without evidence of abscess. 2. 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 3. Hx of appendectomy 4. Asthma 5. BMI 37.5   Plan:    PSBO seems to be resolving.   Dr. [name]  has resumed her Crohn's meds and  She is going for EGD as noted today.   ? Polymicrobial UTI- 50k col multiple sp-  should be well covered by Zosyn, ? Should be able to  [place] at this point.    LOS: 3 days    Duanna Runk,PA-C Pager [phone] General Trauma Pager [phone]

## 2011-11-11 NOTE — Op Note (Signed)
[facility] [place], [place]  [id number]  ENDOSCOPY PROCEDURE REPORT  PATIENT:  Anita, [name]  MR#:  [id number] BIRTHDATE:  11-27-1990, 20 yrs. old  GENDER:  female  ENDOSCOPIST:  Lowella Bandy. [name], MD Referred by:  PROCEDURE DATE:  11/11/2011 PROCEDURE:  EGD, diagnostic 432[address] ASA CLASS:  Class II INDICATIONS:  abdominal pain hx of Crohn's disease of the duodenum, recurrent epig. pain, she has been on PPI's continuously   MEDICATIONS:   These medications were titrated to patient response per physician's verbal order, Versed 12.5 mg, Fentanyl 125 mcg, Benadryl 25 mg TOPICAL ANESTHETIC:  Cetacaine Spray  DESCRIPTION OF PROCEDURE:   After the risks benefits and alternatives of the procedure were thoroughly explained, informed consent was obtained.  The Pentax Gastroscope [id number] endoscope was introduced through the mouth and advanced to the second portion of the duodenum, without limitations.  The instrument was slowly withdrawn as the mucosa was fully examined. <<PROCEDUREIMAGES>>  The upper, middle, and distal third of the esophagus were carefully inspected and no abnormalities were noted. The z-line was well seen at the GEJ. The endoscope was pushed into the fundus which was normal including a retroflexed view. The antrum,gastric body, first and second part of the duodenum were unremarkable (see image1, image2, image3, image4, image5, and image6). no evidence of duodenitis/ulcer, no gastritis, no retained fluid Retroflexed views revealed no abnormalities.    The scope was then withdrawn from the patient and the procedure completed.  COMPLICATIONS:  None  ENDOSCOPIC IMPRESSION: 1) Normal EGD no evidence of active Crohn's disease RECOMMENDATIONS: advance diet, continue PPI  REPEAT EXAM:  In 0 year(s) for.  ______________________________ Lowella Bandy. [name], MD  CC:  n. eSIGNED:   Lowella Bandy. Turner Kunzman at 11/11/2011 12:45 PM  Matilde Sprang,  [phone]

## 2011-11-11 NOTE — Interval H&P Note (Signed)
History and Physical Interval Note:  11/11/2011 11:43 AM  [name]  has presented today for surgery, with the diagnosis of Crohn's disease  The various methods of treatment have been discussed with the patient and family. After consideration of risks, benefits and other options for treatment, the patient has consented to  Procedure(s) (LRB): ESOPHAGOGASTRODUODENOSCOPY (EGD) (N/A) as a surgical intervention .  The patients' history has been reviewed, patient examined, no change in status, stable for surgery.  I have reviewed the patients' chart and labs.  Questions were answered to the patient's satisfaction.     [name]

## 2011-11-11 NOTE — Progress Notes (Signed)
Subjective Incident last night, pt slipped on a leaking IV and fell, feels OK now, epigastric pain , tolerated full liquids, had a low grade temp, had 1 BM. Rectal pain unchanged  Objective: Vital signs in last 24 hours: Temp:  [98.3 F (36.8 C)-100.4 F (38 C)] 100.4 F (38 C) (04/30 0545) Pulse Rate:  [87-110] 98  (04/30 0545) Resp:  [18-20] 18  (04/30 0545) BP: (120-151)/(83-100) 120/86 mmHg (04/30 0545) SpO2:  [96 %-99 %] 96 % (04/30 0545) Last BM Date: 11/09/11 General:   Alert,  pleasant, cooperative in NAD Head:  Normocephalic and atraumatic. Eyes:  Sclera clear, no icterus.   Conjunctiva pink. Mouth:  No deformity or lesions, dentition normal. Neck:  Supple; no masses or thyromegaly. Heart:  Regular rate and rhythm; no murmurs, clicks, rubs,  or gallops. Lungs:  No wheezes or rales Abdomen:  Very tender epigastrium, actibe bowl sounds,,  Msk:  Symmetrical without gross deformities. Normal posture. Pulses:  Normal pulses noted. Extremities:  Without clubbing or edema. Neurologic:  Alert and  oriented x4;  grossly normal neurologically. Skin:  Intact without significant lesions or rashes.  Intake/Output from previous day: 2022/11/28 0701 - 04/30 0700 In: 3066.7 [P.O.:660; I.V.:1506.7; IV Piggyback:900] Out: 1800 [Urine:1800] Intake/Output this shift:    Lab Results:  Basename 11/11/11 0450 11-28-2011 0426 11/09/11 0420  WBC 11.0* 7.2 8.7  HGB 9.6* 9.5* 9.7*  HCT 29.9* 31.0* 31.3*  PLT 460* 490* 500*   BMET  Basename Nov 28, 2011 0426 11/09/11 0420  NA 137 137  K 4.0 3.9  CL 103 104  CO2 26 25  GLUCOSE 84 87  BUN <3* 4*  CREATININE 0.54 0.52  CALCIUM 8.7 8.6   LFT No results found for this basename: PROT,ALBUMIN,AST,ALT,ALKPHOS,BILITOT,BILIDIR,IBILI in the last 72 hours PT/INR No results found for this basename: LABPROT:2,INR:2 in the last 72 hours Hepatitis Panel No results found for this basename: HEPBSAG,HCVAB,HEPAIGM,HEPBIGM in the last 72  hours  Studies/Results: Dg Abd 2 Views  Nov 28, 2011  *RADIOLOGY REPORT*  Clinical Data: Small bowel obstruction with Crohn's disease.  ABDOMEN - 2 VIEW  Comparison: 11/09/2011 and CT of 11/08/2011.  Findings: Upright and supine views.  Upright view demonstrates no free intraperitoneal air.  Resolution of previously described small bowel air fluid levels.  Removal of nasogastric tube.  Right upper quadrant surgical clips.  Supine views demonstrate resolution of small bowel dilatation.  No pneumatosis.  Minimal distal gas.  Phleboliths in the pelvis.  IMPRESSION: Resolution of small bowel obstruction.  Removal of nasogastric tube.  Original Report Authenticated By: Areta Haber, M.D.   Dg Abd 2 Views  11/09/2011  *RADIOLOGY REPORT*  Clinical Data: Follow-up small bowel obstruction.  History of Crohn's disease and bowel resection.  ABDOMEN - 2 VIEW  Comparison: CT obtained yesterday.  Findings: No significant change in dilated small bowel loops without free peritoneal air.  Interval passage of the oral contrast into normal-caliber colon.  Nasogastric tube tip in the proximal stomach and side hole in the distal esophagus.  Cholecystectomy clips.  Minimal scoliosis.  IMPRESSION:  1.  Stable partial small bowel obstruction. 2.  Nasogastric tube tip in the proximal stomach and side hole in the distal esophagus.  Original Report Authenticated By: Gerald Stabs, M.D.     ASSESSMENT:   Principal Problem:  *SBO (small bowel obstruction) Active Problems:  ANEMIA, IRON DEFICIENCY, CHRONIC  CROHN'S DISEASE  Hypokalemia  Anxiety  Cellulitis     PLAN:   Low grade temp and elevated  WBC, ? Perirectal disease?, will continue Zosyn, consider repeating  Limited CT scan of the pelvis  Epigastric pain with history of duodenal Crohn's, will proceed with EGD, continue Protonix IV, stay on liquids  SBO- resolved, tolerating full liquids  Iron def anemia, allergic to IV iron, saw hematology in the past, may  benefit from a hem consult to advice as to IV iron  Preparation which may be tolerated     LOS: 3 days   [name]  11/11/2011, 8:02 AM

## 2011-11-12 ENCOUNTER — Encounter (HOSPITAL_COMMUNITY)
Admission: EM | Disposition: A | Discharge status: Home / Self Care | Payer: Self-pay | Source: Home / Self Care | Attending: Internal Medicine

## 2011-11-12 ENCOUNTER — Encounter (HOSPITAL_COMMUNITY): Payer: Self-pay | Admitting: Anesthesiology

## 2011-11-12 ENCOUNTER — Inpatient Hospital Stay (HOSPITAL_COMMUNITY): Payer: 59 | Admitting: Anesthesiology

## 2011-11-12 ENCOUNTER — Encounter (HOSPITAL_COMMUNITY): Payer: Self-pay | Admitting: Internal Medicine

## 2011-11-12 ENCOUNTER — Inpatient Hospital Stay (HOSPITAL_COMMUNITY): Payer: 59

## 2011-11-12 DIAGNOSIS — L0231 Cutaneous abscess of buttock: Secondary | ICD-10-CM

## 2011-11-12 DIAGNOSIS — L03317 Cellulitis of buttock: Secondary | ICD-10-CM

## 2011-11-12 DIAGNOSIS — E876 Hypokalemia: Secondary | ICD-10-CM

## 2011-11-12 DIAGNOSIS — K612 Anorectal abscess: Secondary | ICD-10-CM

## 2011-11-12 DIAGNOSIS — K56609 Unspecified intestinal obstruction, unspecified as to partial versus complete obstruction: Secondary | ICD-10-CM

## 2011-11-12 DIAGNOSIS — K5000 Crohn's disease of small intestine without complications: Secondary | ICD-10-CM

## 2011-11-12 DIAGNOSIS — L0291 Cutaneous abscess, unspecified: Secondary | ICD-10-CM

## 2011-11-12 HISTORY — PX: PROCTOSCOPY: SHX2266

## 2011-11-12 HISTORY — PX: INCISION AND DRAINAGE PERIRECTAL ABSCESS: SHX1804

## 2011-11-12 HISTORY — PX: EXAMINATION UNDER ANESTHESIA: SHX1540

## 2011-11-12 SURGERY — EXAM UNDER ANESTHESIA
Anesthesia: General | Site: Rectum | Wound class: Dirty or Infected

## 2011-11-12 MED ORDER — LACTATED RINGERS IV SOLN
INTRAVENOUS | Status: DC | PRN
  Administered 2011-11-12: 13:00:00 via INTRAVENOUS

## 2011-11-12 MED ORDER — CISATRACURIUM BESYLATE 2 MG/ML IV SOLN
INTRAVENOUS | Status: DC | PRN
  Administered 2011-11-12: 6 mg via INTRAVENOUS
  Administered 2011-11-12: 4 mg via INTRAVENOUS
  Administered 2011-11-12: 2 mg via INTRAVENOUS

## 2011-11-12 MED ORDER — LACTATED RINGERS IV SOLN
INTRAVENOUS | Status: DC
  Administered 2011-11-12: 1000 mL via INTRAVENOUS

## 2011-11-12 MED ORDER — FENTANYL CITRATE 0.05 MG/ML IJ SOLN
25.0000 ug | INTRAMUSCULAR | Status: DC | PRN
  Administered 2011-11-12: 50 ug via INTRAVENOUS

## 2011-11-12 MED ORDER — HYDROMORPHONE HCL PF 1 MG/ML IJ SOLN
INTRAMUSCULAR | Status: DC | PRN
  Administered 2011-11-12 (×2): 1 mg via INTRAVENOUS

## 2011-11-12 MED ORDER — LACTATED RINGERS IV SOLN: INTRAVENOUS | Status: DC

## 2011-11-12 MED ORDER — ONDANSETRON HCL 4 MG/2ML IJ SOLN
INTRAMUSCULAR | Status: DC | PRN
  Administered 2011-11-12 (×2): 2 mg via INTRAVENOUS

## 2011-11-12 MED ORDER — FENTANYL CITRATE 0.05 MG/ML IJ SOLN
INTRAMUSCULAR | Status: DC | PRN
  Administered 2011-11-12: 100 ug via INTRAVENOUS
  Administered 2011-11-12 (×3): 50 ug via INTRAVENOUS

## 2011-11-12 MED ORDER — KETAMINE HCL 10 MG/ML IJ SOLN
INTRAMUSCULAR | Status: DC | PRN
  Administered 2011-11-12: 10 mg via INTRAVENOUS

## 2011-11-12 MED ORDER — NEOSTIGMINE METHYLSULFATE 1 MG/ML IJ SOLN
INTRAMUSCULAR | Status: DC | PRN
  Administered 2011-11-12: 5 mg via INTRAVENOUS

## 2011-11-12 MED ORDER — MIDAZOLAM HCL 5 MG/5ML IJ SOLN
INTRAMUSCULAR | Status: DC | PRN
  Administered 2011-11-12 (×2): 1 mg via INTRAVENOUS

## 2011-11-12 MED ORDER — GLYCOPYRROLATE 0.2 MG/ML IJ SOLN
INTRAMUSCULAR | Status: DC | PRN
  Administered 2011-11-12: .6 mg via INTRAVENOUS

## 2011-11-12 MED ORDER — SUCCINYLCHOLINE CHLORIDE 20 MG/ML IJ SOLN
INTRAMUSCULAR | Status: DC | PRN
  Administered 2011-11-12: 100 mg via INTRAVENOUS

## 2011-11-12 MED ORDER — ESMOLOL HCL 10 MG/ML IV SOLN
INTRAVENOUS | Status: DC | PRN
  Administered 2011-11-12: 20 mg via INTRAVENOUS

## 2011-11-12 MED ORDER — LIDOCAINE HCL (CARDIAC) 20 MG/ML IV SOLN
INTRAVENOUS | Status: DC | PRN
  Administered 2011-11-12: 20 mg via INTRAVENOUS

## 2011-11-12 MED ORDER — BUPIVACAINE-EPINEPHRINE 0.25% -1:200000 IJ SOLN
INTRAMUSCULAR | Status: DC | PRN
  Administered 2011-11-12: 30 mL

## 2011-11-12 MED ORDER — PROPOFOL 10 MG/ML IV EMUL
INTRAVENOUS | Status: DC | PRN
  Administered 2011-11-12: 180 mg via INTRAVENOUS

## 2011-11-12 MED FILL — Cisatracurium Besylate (PF) IV Soln 10 MG/5ML (2 MG/ML): INTRAVENOUS | Qty: 5 | Status: AC

## 2011-11-12 SURGICAL SUPPLY — 54 items
BLADE HEX COATED 2.75 (ELECTRODE) ×2 IMPLANT
BLADE SURG 15 STRL LF DISP TIS (BLADE) ×2 IMPLANT
BLADE SURG 15 STRL SS (BLADE) ×2
CANISTER SUCTION 2500CC (MISCELLANEOUS) ×2 IMPLANT
CLOTH BEACON ORANGE TIMEOUT ST (SAFETY) ×2 IMPLANT
DECANTER SPIKE VIAL GLASS SM (MISCELLANEOUS) ×2 IMPLANT
DRAPE LAPAROTOMY T 102X78X121 (DRAPES) IMPLANT
DRAPE LG THREE QUARTER DISP (DRAPES) ×2 IMPLANT
DRSG PAD ABDOMINAL 8X10 ST (GAUZE/BANDAGES/DRESSINGS) ×2 IMPLANT
ELECT REM PT RETURN 9FT ADLT (ELECTROSURGICAL) ×2
ELECTRODE REM PT RTRN 9FT ADLT (ELECTROSURGICAL) ×1 IMPLANT
GAUZE PACKING IODOFORM 1/4X5 (PACKING) ×2 IMPLANT
GAUZE SPONGE 4X4 16PLY XRAY LF (GAUZE/BANDAGES/DRESSINGS) ×2 IMPLANT
GAUZE VASELINE 3X9 (GAUZE/BANDAGES/DRESSINGS) IMPLANT
GLOVE BIO SURGEON STRL SZ7 (GLOVE) ×4 IMPLANT
GLOVE BIOGEL PI IND STRL 7.0 (GLOVE) ×1 IMPLANT
GLOVE BIOGEL PI IND STRL 7.5 (GLOVE) ×2 IMPLANT
GLOVE BIOGEL PI INDICATOR 7.0 (GLOVE) ×1
GLOVE BIOGEL PI INDICATOR 7.5 (GLOVE) ×2
GLOVE ECLIPSE 8.0 STRL XLNG CF (GLOVE) ×2 IMPLANT
GLOVE INDICATOR 8.0 STRL GRN (GLOVE) ×2 IMPLANT
GOWN PREVENTION PLUS LG XLONG (DISPOSABLE) ×2 IMPLANT
GOWN PREVENTION PLUS XLARGE (GOWN DISPOSABLE) ×2 IMPLANT
GOWN STRL NON-REIN LRG LVL3 (GOWN DISPOSABLE) ×2 IMPLANT
GOWN STRL REIN XL XLG (GOWN DISPOSABLE) ×4 IMPLANT
KIT BASIN OR (CUSTOM PROCEDURE TRAY) ×2 IMPLANT
LEGGING LITHOTOMY PAIR STRL (DRAPES) IMPLANT
LUBRICANT JELLY K Y 4OZ (MISCELLANEOUS) ×2 IMPLANT
NDL SAFETY ECLIPSE 18X1.5 (NEEDLE) IMPLANT
NEEDLE HYPO 18GX1.5 SHARP (NEEDLE)
NEEDLE HYPO 22GX1.5 SAFETY (NEEDLE) ×2 IMPLANT
NEEDLE HYPO 25X1 1.5 SAFETY (NEEDLE) ×2 IMPLANT
NS IRRIG 1000ML POUR BTL (IV SOLUTION) ×2 IMPLANT
PACK BASIC VI WITH GOWN DISP (CUSTOM PROCEDURE TRAY) ×2 IMPLANT
PACK LITHOTOMY IV (CUSTOM PROCEDURE TRAY) ×2 IMPLANT
PAD ABD 7.5X8 STRL (GAUZE/BANDAGES/DRESSINGS) IMPLANT
PENCIL BUTTON HOLSTER BLD 10FT (ELECTRODE) ×2 IMPLANT
SOL PREP PROV IODINE SCRUB 4OZ (MISCELLANEOUS) ×2 IMPLANT
SPONGE GAUZE 4X4 12PLY (GAUZE/BANDAGES/DRESSINGS) ×2 IMPLANT
SPONGE SURGIFOAM ABS GEL 12-7 (HEMOSTASIS) ×6 IMPLANT
SUT CHROMIC 2 0 SH (SUTURE) IMPLANT
SUT CHROMIC 3 0 SH 27 (SUTURE) IMPLANT
SUT MON AB 3-0 SH 27 (SUTURE)
SUT MON AB 3-0 SH27 (SUTURE) IMPLANT
SUT VIC AB 2-0 SH 27 (SUTURE)
SUT VIC AB 2-0 SH 27X BRD (SUTURE) IMPLANT
SUT VIC AB 4-0 P-3 18XBRD (SUTURE) IMPLANT
SUT VIC AB 4-0 P3 18 (SUTURE)
SUT VIC AB 4-0 SH 18 (SUTURE) IMPLANT
SWAB COLLECTION DEVICE MRSA (MISCELLANEOUS) IMPLANT
SYR CONTROL 10ML LL (SYRINGE) ×2 IMPLANT
TOWEL OR 17X26 10 PK STRL BLUE (TOWEL DISPOSABLE) ×2 IMPLANT
UNDERPAD 30X30 INCONTINENT (UNDERPADS AND DIAPERS) IMPLANT
YANKAUER SUCT BULB TIP 10FT TU (MISCELLANEOUS) ×2 IMPLANT

## 2011-11-12 NOTE — Progress Notes (Signed)
Pt c/o having to urinate unable to use bedpan.  Patient stated that she wanted an In and Out cath states that she has trouble peeing after surgery.  Dr Hassell Done in .  In and Out Cath ordered received.  Pt tolerated well 500 cc obtained.

## 2011-11-12 NOTE — Progress Notes (Signed)
Subjective: Complaining of abdominal pain.  No other specific complaints.  Patient had EGD yesterday.  Objective: Vital signs in last 24 hours: Filed Vitals:   11/11/11 1320 11/11/11 1400 11/11/11 2145 11/12/11 0547  BP: 120/62 139/86 147/100 126/84  Pulse:  88 91 95  Temp:  98.1 F (36.7 C) 99.2 F (37.3 C) 99.8 F (37.7 C)  TempSrc:  Oral Oral Oral  Resp: 26 20 20 18   Height:      Weight:      SpO2: 99% 100% 96% 99%   Weight change:   Intake/Output Summary (Last 24 hours) at 11/12/11 1032 Last data filed at 11/12/11 0913  Gross per 24 hour  Intake   1426 ml  Output   1400 ml  Net     26 ml    Physical Exam: General: Awake, Oriented, No acute distress. HEENT: EOMI. Neck: Supple CV: S1 and S2 Lungs: Clear to ascultation bilaterally Abdomen: Soft, generalized tenderness with some guarding, Nondistended, +bowel sounds. Ext: Good pulses. Trace edema.  Lab Results:  [facility] 11/10/11 0426  NA 137  K 4.0  CL 103  CO2 26  GLUCOSE 84  BUN <3*  CREATININE 0.54  CALCIUM 8.7  MG --  PHOS --   No results found for this basename: AST:2,ALT:2,ALKPHOS:2,BILITOT:2,PROT:2,ALBUMIN:2 in the last 72 hours No results found for this basename: LIPASE:2,AMYLASE:2 in the last 72 hours  Basename 11/11/11 0450 11/10/11 0426  WBC 11.0* 7.2  NEUTROABS 8.1* --  HGB 9.6* 9.5*  HCT 29.9* 31.0*  MCV 80.8 82.4  PLT 460* 490*   No results found for this basename: CKTOTAL:3,CKMB:3,CKMBINDEX:3,TROPONINI:3 in the last 72 hours No components found with this basename: POCBNP:3 No results found for this basename: DDIMER:2 in the last 72 hours No results found for this basename: HGBA1C:2 in the last 72 hours No results found for this basename: CHOL:2,HDL:2,LDLCALC:2,TRIG:2,CHOLHDL:2,LDLDIRECT:2 in the last 72 hours No results found for this basename: TSH,T4TOTAL,FREET3,T3FREE,THYROIDAB in the last 72 hours No results found for this basename:  VITAMINB12:2,FOLATE:2,FERRITIN:2,TIBC:2,IRON:2,RETICCTPCT:2 in the last 72 hours  Micro Results: Recent Results (from the past 240 hour(s))  URINE CULTURE     Status: Normal   Collection Time   11/08/11  8:43 AM      Component Value Range Status Comment   Specimen Description URINE, CLEAN CATCH   Final    Special Requests NONE   Final    Culture  Setup Time [id number]   Final    Colony Count 50,000 COLONIES/ML   Final    Culture     Final    Value: Multiple bacterial morphotypes present, none predominant. Suggest appropriate recollection if clinically indicated.   Report Status 11/09/2011 FINAL   Final   MRSA PCR SCREENING     Status: Normal   Collection Time   11/08/11  2:28 PM      Component Value Range Status Comment   MRSA by PCR NEGATIVE  NEGATIVE  Final     Studies/Results: No results found.  Medications: I have reviewed the patient's current medications. Scheduled Meds:   . mercaptopurine  50 mg Oral Daily  . pantoprazole (PROTONIX) IV  40 mg Intravenous Q12H  . piperacillin-tazobactam (ZOSYN)  IV  3.375 g Intravenous Q8H  . DISCONTD: vancomycin  1,250 mg Intravenous Q8H   Continuous Infusions:   . 0.9 % NaCl with KCl 40 mEq / L 100 mL/hr at 11/12/11 0226   PRN Meds:.butamben-tetracaine-benzocaine, diphenhydrAMINE, diphenhydrAMINE, fentaNYL, HYDROmorphone (DILAUDID) injection, ketorolac, LORazepam, midazolam, ondansetron (ZOFRAN) IV,  ondansetron, oxyCODONE-acetaminophen  Assessment/Plan: Antibiotics:  Vancomycin 11/08/11---> 11/11/2011 Zosyn 11/10/11--->  Medical Consultants:  Dr. Erskine Emery and Dr. [name], Gastroenterology  Dr. Fanny Skates and Dr. Donne Hazel, Surgery  SBO (small bowel obstruction)  Confirmed by CT scan on 11/08/11.  Seen by Dr. Dalbert Batman on 11/08/11 who recommended continuing bowel rest, and NG tube suction.  NG tube dislodged 11/09/11, OK to leave out unless she has recurrent N/V.  Abdominal films done 11/09/11: Stable; Repeated 11/10/11:  Showed SBO resolution.  Potassium replaced.  GI consultation performed by Dr. Deatra Ina on 11/09/11: SBO felt to be mechanical, hold 6-MP until able to take PO's.  Diet advanced to clears 11/10/11 and 6-MP resumed.  Taken for EGD 11/11/11. Normal. No evidence of active Crohn's disease.  Advanced to solid diet today.  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  No evidence of abscess on CT.  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  Nasal swab for PCR MRSA negative.  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  Patient was taken to surgery today, 11/12/2011 for incision and drainage of perirectal abscess.  Patient also had rigid proctoscopy.  Continue wound care.  [place] on hold, 6-MP resumed.  Seen by Dr. Deatra Ina on 11/09/11, Dr. [name] now following.  Hypokalemia  Replaced in IVF.  Anxiety  Hold oral Ativan, give IV PRN.  Chronic iron deficiency anemia  Known history of menorrhagia.  Hemoglobin stable.  Code Status: Full  Family Communication: None, at bedside.  Disposition Plan: Home, when stable.    LOS: 4 days  Nilay Mangrum A, MD 11/12/2011, 10:32 AM

## 2011-11-12 NOTE — Progress Notes (Signed)
1 Day Post-Op  Subjective:  Drainage from rectum, still with some ab pain  Objective: Vital signs in last 24 hours: Temp:  [98.1 F (36.7 C)-99.8 F (37.7 C)] 99.8 F (37.7 C) (05/01 0547) Pulse Rate:  [88-95] 95  (05/01 0547) Resp:  [10-28] 18  (05/01 0547) BP: (108-148)/(49-100) 126/84 mmHg (05/01 0547) SpO2:  [96 %-100 %] 99 % (05/01 0547) Last BM Date: 11/11/11  Intake/Output from previous day: 04/30 0701 - 05/01 0700 In: 8832 [P.O.:420; I.V.:1100; IV Piggyback:66] Out: 1000 [Urine:1000] Intake/Output this shift: Total I/O In: -  Out: 400 [Urine:400]  GI: incision well healed, mild tenderness throughout Difficult to examine due to pain, left buttock induration with purulence draining from superior to anus  Lab Results:   [facility] 11/11/11 0450 11/10/11 0426  WBC 11.0* 7.2  HGB 9.6* 9.5*  HCT 29.9* 31.0*  PLT 460* 490*   BMET  Basename 11/10/11 0426  NA 137  K 4.0  CL 103  CO2 26  GLUCOSE 84  BUN <3*  CREATININE 0.54  CALCIUM 8.7    Studies/Results: No results found.  Anti-infectives: Anti-infectives     Start     Dose/Rate Route Frequency Ordered Stop   11/10/11 1000  piperacillin-tazobactam (ZOSYN) IVPB 3.375 g       3.375 g 12.5 mL/hr over 240 Minutes Intravenous Every 8 hours 11/10/11 0853     11/09/11 2200   vancomycin (VANCOCIN) 1,250 mg in sodium chloride 0.9 % 250 mL IVPB  Status:  Discontinued        1,250 mg 166.7 mL/hr over 90 Minutes Intravenous Every 8 hours 11/09/11 1433 11/11/11 1651   11/08/11 1400   vancomycin (VANCOCIN) IVPB 1000 mg/200 mL premix  Status:  Discontinued        1,000 mg 200 mL/hr over 60 Minutes Intravenous Every 8 hours 11/08/11 1352 11/09/11 1433          Assessment/Plan: Crohns disease Resolving sbo now with perirectal abscess  Plan to go to or today for eua and likely drainage   LOS: 4 days    [facility] 11/12/2011

## 2011-11-12 NOTE — Progress Notes (Signed)
Subjective Still c/o abd.pain, taking pain meds, low grade temp last night, painful perianal area without drainage, reluctant to start solid food, EGD was normal  Objective: Vital signs in last 24 hours: Temp:  [98.1 F (36.7 C)-99.8 F (37.7 C)] 99.8 F (37.7 C) (05/01 0547) Pulse Rate:  [88-95] 95  (05/01 0547) Resp:  [10-28] 18  (05/01 0547) BP: (108-148)/(49-100) 126/84 mmHg (05/01 0547) SpO2:  [96 %-100 %] 99 % (05/01 0547) Last BM Date: 11/11/11 General:   Alert,  pleasant, cooperative in NAD Head:  Normocephalic and atraumatic. Eyes:  Sclera clear, no icterus.   Conjunctiva pink. Mouth:  No deformity or lesions, dentition normal. Neck:  Supple; no masses or thyromegaly. Heart:  Regular rate and rhythm; no murmurs, clicks, rubs,  or gallops. Lungs:  No wheezes or rales Abdomen:  Obese, very tender epigastrium and across upper abdomen, few high pitch bowl sounds  Msk:  Symmetrical without gross deformities. Normal posture. Extremities:  Without clubbing or edema. Neurologic:  Alert and  oriented x4;  grossly normal neurologically. Skin:  Intact without significant lesions or rashes. Rectum: left perineal induration and tenderness about the same as yesterday, no drainage, Intake/Output from previous day: 04/30 0701 - 05/01 0700 In: 9432 [P.O.:420; I.V.:1100; IV Piggyback:66] Out: 1000 [Urine:1000] Intake/Output this shift: Total I/O In: -  Out: 400 [Urine:400]  Lab Results:  Basename 11/11/11 0450 11/10/11 0426  WBC 11.0* 7.2  HGB 9.6* 9.5*  HCT 29.9* 31.0*  PLT 460* 490*   BMET  Basename 11/10/11 0426  NA 137  K 4.0  CL 103  CO2 26  GLUCOSE 84  BUN <3*  CREATININE 0.54  CALCIUM 8.7   LFT No results found for this basename: PROT,ALBUMIN,AST,ALT,ALKPHOS,BILITOT,BILIDIR,IBILI in the last 72 hours PT/INR No results found for this basename: LABPROT:2,INR:2 in the last 72 hours Hepatitis Panel No results found for this basename:  HEPBSAG,HCVAB,HEPAIGM,HEPBIGM in the last 72 hours  Studies/Results: No results found.   ASSESSMENT:   Principal Problem:  *SBO (small bowel obstruction) Active Problems:  ANEMIA, IRON DEFICIENCY, CHRONIC  CROHN'S DISEASE  Hypokalemia  Anxiety  Cellulitis     PLAN:   Continue IV Zosyn, advance diet Follow up CT scan of the perineum and abdomen,from 11/08/2011 Advance to low residue diet Sitz baths     LOS: 4 days   [name]  11/12/2011, 9:52 AM

## 2011-11-12 NOTE — Anesthesia Preprocedure Evaluation (Addendum)
Anesthesia Evaluation  Patient identified by MRN, date of birth, ID band Patient awake    Reviewed: Allergy & Precautions, H&P , NPO status , Patient's Chart, lab work & pertinent test results  Airway Mallampati: II TM Distance: >3 FB Neck ROM: full    Dental No notable dental hx. (+) Teeth Intact and Dental Advisory Given   Pulmonary neg pulmonary ROS,  breath sounds clear to auscultation  Pulmonary exam normal       Cardiovascular Exercise Tolerance: Good + dysrhythmias Supra Ventricular Tachycardia Rhythm:regular Rate:Normal  SVT associated with post op period.   Neuro/Psych negative neurological ROS  negative psych ROS   GI/Hepatic negative GI ROS, Neg liver ROS, Crohn's disease   Endo/Other  negative endocrine ROS  Renal/GU negative Renal ROS  negative genitourinary   Musculoskeletal   Abdominal   Peds  Hematology negative hematology ROS (+)   Anesthesia Other Findings   Reproductive/Obstetrics negative OB ROS                         Anesthesia Physical Anesthesia Plan  ASA: II  Anesthesia Plan: General   Post-op Pain Management:    Induction: Intravenous  Airway Management Planned: Oral ETT  Additional Equipment:   Intra-op Plan:   Post-operative Plan: Extubation in OR  Informed Consent: I have reviewed the patients History and Physical, chart, labs and discussed the procedure including the risks, benefits and alternatives for the proposed anesthesia with the patient or authorized representative who has indicated his/her understanding and acceptance.   Dental Advisory Given  Plan Discussed with: CRNA and Surgeon  Anesthesia Plan Comments:        Anesthesia Quick Evaluation

## 2011-11-12 NOTE — Op Note (Signed)
Preoperative diagnosis: Crohn's disease, perirectal abscess postoperative diagnosis: Same as above Procedure: #1 exam under anesthesia #2 incision and drainage of perirectal abscess #3 rigid proctoscopy Surgeon: Dr. Serita Grammes Anesthesia: Gen. Specimens: None Drains: Packing an open incision Complications: None Sponge and needle count correct x2 at operation Disposition to recovery in stable condition  Indications: This is a 21 year old female with known Crohn's disease who has undergone prior surgery. She is well known to our practice. She appeared to have a mechanical small bowel obstruction which is now resolving. She has also had some perirectal induration and tenderness. Today when I evaluated her I noted some purulence in an area that looked like it needed to be drained. It was difficult to examine her we discussed going to the operating room for exam under anesthesia.  Procedure: After informed consent was obtained the patient was taken to the operating room. She was administered Zosyn on the floor.she was then placed under general anesthesia without complication. She was then rolled prone and appropriately padded. Her buttocks were taped apart. She was prepped and draped in the standard sterile surgical fashion. A surgical timeout was then performed.  Upon evaluation she clearly had purulent fluid draining as well as a fair amount of diarrhea. The purulent fluid was coming from the anterior portion of her anus just to the left of midline. There was a fair amount of induration over there as well. I then made an elliptical incision in a radial fashion around this area and got into a large pocket of purulence it was completely drained. I irrigated this. I then did anoscopy and she also had a posterior fissure in the midline as well. I did not really treat this in any fashion today. I then did rigid proctoscopy that was otherwise fairly normal. I then packed the open wound. A dressing was  placed. I also did an anal block with quarter percent Marcaine. She tolerated this well and was transferred to recovery in stable condition.

## 2011-11-12 NOTE — Transfer of Care (Signed)
Immediate Anesthesia Transfer of Care Note  Patient: [name]  Procedure(s) Performed: Procedure(s) (LRB): EXAM UNDER ANESTHESIA (N/A) PROCTOSCOPY (N/A) IRRIGATION AND DEBRIDEMENT PERIRECTAL ABSCESS (N/A)  Patient Location: PACU  Anesthesia Type: General  Level of Consciousness: awake and alert   Airway & Oxygen Therapy: Patient Spontanous Breathing and Patient connected to face mask oxygen  Post-op Assessment: Report given to PACU RN  Post vital signs: Reviewed and stable  Complications: No apparent anesthesia complications

## 2011-11-12 NOTE — Anesthesia Postprocedure Evaluation (Signed)
  Anesthesia Post-op Note  Patient: Verma Xxx-Eichinger  Procedure(s) Performed: Procedure(s) (LRB): EXAM UNDER ANESTHESIA (N/A) PROCTOSCOPY (N/A) IRRIGATION AND DEBRIDEMENT PERIRECTAL ABSCESS (N/A)  Patient Location: PACU  Anesthesia Type: General  Level of Consciousness: awake and alert   Airway and Oxygen Therapy: Patient Spontanous Breathing  Post-op Pain: mild  Post-op Assessment: Post-op Vital signs reviewed, Patient's Cardiovascular Status Stable, Respiratory Function Stable, Patent Airway and No signs of Nausea or vomiting  Post-op Vital Signs: stable  Complications: No apparent anesthesia complications

## 2011-11-13 ENCOUNTER — Encounter (HOSPITAL_COMMUNITY): Payer: Self-pay | Admitting: General Surgery

## 2011-11-13 DIAGNOSIS — L0231 Cutaneous abscess of buttock: Secondary | ICD-10-CM

## 2011-11-13 DIAGNOSIS — K5000 Crohn's disease of small intestine without complications: Secondary | ICD-10-CM

## 2011-11-13 DIAGNOSIS — N179 Acute kidney failure, unspecified: Secondary | ICD-10-CM

## 2011-11-13 DIAGNOSIS — L03317 Cellulitis of buttock: Secondary | ICD-10-CM

## 2011-11-13 DIAGNOSIS — K56609 Unspecified intestinal obstruction, unspecified as to partial versus complete obstruction: Secondary | ICD-10-CM

## 2011-11-13 LAB — CBC
Hemoglobin: 8.8 g/dL — ABNORMAL LOW (ref 12.0–15.0)
MCH: 25.5 pg — ABNORMAL LOW (ref 26.0–34.0)
MCHC: 31 g/dL (ref 30.0–36.0)
MCV: 82.3 fL (ref 78.0–100.0)
Platelets: 485 10*3/uL — ABNORMAL HIGH (ref 150–400)
RBC: 3.45 MIL/uL — ABNORMAL LOW (ref 3.87–5.11)

## 2011-11-13 LAB — BASIC METABOLIC PANEL
CO2: 26 mEq/L (ref 19–32)
Calcium: 8.7 mg/dL (ref 8.4–10.5)
Creatinine, Ser: 1.91 mg/dL — ABNORMAL HIGH (ref 0.50–1.10)
Glucose, Bld: 84 mg/dL (ref 70–99)

## 2011-11-13 MED ORDER — SODIUM CHLORIDE 0.9 % IV SOLN
INTRAVENOUS | Status: AC
  Filled 2011-11-13 (×3): qty 1000

## 2011-11-13 MED ORDER — POTASSIUM CHLORIDE IN NACL 40-0.9 MEQ/L-% IV SOLN
INTRAVENOUS | Status: DC
  Filled 2011-11-13: qty 1000

## 2011-11-13 MED ORDER — METRONIDAZOLE 500 MG PO TABS
500.0000 mg | ORAL_TABLET | Freq: Three times a day (TID) | ORAL | Status: DC
  Administered 2011-11-13 – 2011-11-15 (×8): 500 mg via ORAL
  Filled 2011-11-13 (×12): qty 1

## 2011-11-13 MED ORDER — PANTOPRAZOLE SODIUM 40 MG PO TBEC
40.0000 mg | DELAYED_RELEASE_TABLET | Freq: Two times a day (BID) | ORAL | Status: DC
  Administered 2011-11-13 – 2011-11-16 (×6): 40 mg via ORAL
  Filled 2011-11-13 (×10): qty 1

## 2011-11-13 MED ORDER — CIPROFLOXACIN HCL 250 MG PO TABS
250.0000 mg | ORAL_TABLET | Freq: Two times a day (BID) | ORAL | Status: DC
  Administered 2011-11-13 – 2011-11-16 (×7): 250 mg via ORAL
  Filled 2011-11-13 (×9): qty 1

## 2011-11-13 NOTE — Progress Notes (Addendum)
The patient is receiving protonix by the intravenous route.  Based on criteria approved by the Pharmacy and [place], the medication is being converted to the equivalent oral dose form.  These criteria include: -No Active GI bleeding -Able to tolerate diet of full liquids (or better) or tube feeding -Able to tolerate other medications by the oral or enteral route  If you have any questions about this conversion, please contact the Pharmacy Department (ext [phone]).  Thank you.  Glee Arvin, [facility] 11/13/2011 9:28 AM

## 2011-11-13 NOTE — Progress Notes (Signed)
 Gastroenterology Progress Note  SUBJECTIVE: feels okay this am, hungry  OBJECTIVE:  Vital signs in last 24 hours: Temp:  [97.1 F (36.2 C)-100.1 F (37.8 C)] 100.1 F (37.8 C) (05/02 0559) Pulse Rate:  [68-97] 78  (05/02 0559) Resp:  [12-20] 18  (05/02 0559) BP: (108-172)/(67-98) 108/67 mmHg (05/02 0559) SpO2:  [92 %-100 %] 99 % (05/02 0559) Last BM Date: 12/11/11 General:    Pleasant black female in NAD Heart:  Regular rate and rhythm Abdomen:  Soft, nondistended.  Extremities:  Without edema. Neurologic:  Alert and oriented,  grossly normal neurologically. Psych:  Cooperative. Normal mood and affect.    Lab Results:  Basename 11/13/11 0430 11/11/11 0450  WBC 12.0* 11.0*  HGB 8.8* 9.6*  HCT 28.4* 29.9*  PLT 485* 460*   BMET  Basename 11/13/11 0430  NA 135  K 3.9  CL 100  CO2 26  GLUCOSE 84  BUN 7  CREATININE 1.91*  CALCIUM 8.7     ASSESSMENT / PLAN:  1. Crohn's disease. S/P I&D of perirectal abscess yesterday by  Dr. Donne Hazel. Surgery okay with changing her to oral antibiotics, will give her Cipro and Flagyl. Surgical packing to be removed tomorrow 2. Elevated creatinine of 1.91 (it was 0.54 3 days ago). Lab error?  Will recheck and increase fluids if truly elevated.     LOS: 5 days   [name]  11/13/2011, 9:30 AM

## 2011-11-13 NOTE — Progress Notes (Signed)
I have reviewed the above note, examined the patient and agree with plan of treatment. Will advance diet , anticipate resolution of partial SBO, elevated creatinine out of line BUN=7, excuse for work x 2 weeks given for her employer

## 2011-11-13 NOTE — Progress Notes (Signed)
1 Day Post-Op  Subjective: No complaints, minimal pain  Objective: Vital signs in last 24 hours: Temp:  [97.1 F (36.2 C)-100.1 F (37.8 C)] 100.1 F (37.8 C) (05/02 0559) Pulse Rate:  [68-97] 78  (05/02 0559) Resp:  [12-20] 18  (05/02 0559) BP: (108-172)/(67-98) 108/67 mmHg (05/02 0559) SpO2:  [92 %-100 %] 99 % (05/02 0559) Last BM Date: 12/11/11  Intake/Output from previous day: 05/01 0701 - 05/02 0700 In: 3908.3 [P.O.:840; I.V.:2918.3; IV Piggyback:150] Out: 2275 [Urine:2250; Blood:25] Intake/Output this shift:    GI: soft, minimally tender  Lab Results:   Basename 11/13/11 0430 11/11/11 0450  WBC 12.0* 11.0*  HGB 8.8* 9.6*  HCT 28.4* 29.9*  PLT 485* 460*   BMET  Basename 11/13/11 0430  NA 135  K 3.9  CL 100  CO2 26  GLUCOSE 84  BUN 7  CREATININE 1.91*  CALCIUM 8.7   PT/INR No results found for this basename: LABPROT:2,INR:2 in the last 72 hours ABG No results found for this basename: PHART:2,PCO2:2,PO2:2,HCO3:2 in the last 72 hours  Studies/Results: No results found.  Anti-infectives: Anti-infectives     Start     Dose/Rate Route Frequency Ordered Stop   11/10/11 1000  piperacillin-tazobactam (ZOSYN) IVPB 3.375 g       3.375 g 12.5 mL/hr over 240 Minutes Intravenous Every 8 hours 11/10/11 0853     11/09/11 2200   vancomycin (VANCOCIN) 1,250 mg in sodium chloride 0.9 % 250 mL IVPB  Status:  Discontinued        1,250 mg 166.7 mL/hr over 90 Minutes Intravenous Every 8 hours 11/09/11 1433 11/11/11 1651   11/08/11 1400   vancomycin (VANCOCIN) IVPB 1000 mg/200 mL premix  Status:  Discontinued        1,000 mg 200 mL/hr over 60 Minutes Intravenous Every 8 hours 11/08/11 1352 11/09/11 1433          Assessment/Plan: POD 1 I and D perirectal abscess, procto Resolved sbo  SBO has resolved Will take packing out tomorrow and may be able to leave out with sitz baths vs packing at home, can change to oral abx whenever ready and would give 7 d course  for abscess   LOS: 5 days    [facility] 11/13/2011

## 2011-11-13 NOTE — Progress Notes (Signed)
Subjective: Patient had fever today.  Was taken to surgery yesterday.  Appetite improved today.  Tolerating solid food.  Objective: Vital signs in last 24 hours: Filed Vitals:   11/12/11 2140 11/13/11 0212 11/13/11 0559 11/13/11 1000  BP: 110/72 122/82 108/67 152/101  Pulse: 75 84 78 96  Temp: 98 F (36.7 C) 99.3 F (37.4 C) 100.1 F (37.8 C) 102.1 F (38.9 C)  TempSrc: Oral Oral Oral Oral  Resp: 20 18 18 18   Height:      Weight:      SpO2: 99% 100% 99% 98%   Weight change:   Intake/Output Summary (Last 24 hours) at 11/13/11 1206 Last data filed at 11/13/11 1000  Gross per 24 hour  Intake 3928.33 ml  Output   1875 ml  Net 2053.33 ml    Physical Exam: General: Awake, Oriented, No acute distress. HEENT: EOMI. Neck: Supple CV: S1 and S2 Lungs: Clear to ascultation bilaterally Abdomen: Soft, generalized tenderness with some guarding, Nondistended, +bowel sounds. Ext: Good pulses. Trace edema.  Lab Results:  Basename 11/13/11 1045 11/13/11 0430  NA -- 135  K -- 3.9  CL -- 100  CO2 -- 26  GLUCOSE -- 84  BUN -- 7  CREATININE 1.95* 1.91*  CALCIUM -- 8.7  MG -- --  PHOS -- --   No results found for this basename: AST:2,ALT:2,ALKPHOS:2,BILITOT:2,PROT:2,ALBUMIN:2 in the last 72 hours No results found for this basename: LIPASE:2,AMYLASE:2 in the last 72 hours  Basename 11/13/11 0430 11/11/11 0450  WBC 12.0* 11.0*  NEUTROABS -- 8.1*  HGB 8.8* 9.6*  HCT 28.4* 29.9*  MCV 82.3 80.8  PLT 485* 460*   No results found for this basename: CKTOTAL:3,CKMB:3,CKMBINDEX:3,TROPONINI:3 in the last 72 hours No components found with this basename: POCBNP:3 No results found for this basename: DDIMER:2 in the last 72 hours No results found for this basename: HGBA1C:2 in the last 72 hours No results found for this basename: CHOL:2,HDL:2,LDLCALC:2,TRIG:2,CHOLHDL:2,LDLDIRECT:2 in the last 72 hours No results found for this basename: TSH,T4TOTAL,FREET3,T3FREE,THYROIDAB in the last 72  hours No results found for this basename: VITAMINB12:2,FOLATE:2,FERRITIN:2,TIBC:2,IRON:2,RETICCTPCT:2 in the last 72 hours  Micro Results: Recent Results (from the past 240 hour(s))  URINE CULTURE     Status: Normal   Collection Time   11/08/11  8:43 AM      Component Value Range Status Comment   Specimen Description URINE, CLEAN CATCH   Final    Special Requests NONE   Final    Culture  Setup Time [id number]   Final    Colony Count 50,000 COLONIES/ML   Final    Culture     Final    Value: Multiple bacterial morphotypes present, none predominant. Suggest appropriate recollection if clinically indicated.   Report Status 11/09/2011 FINAL   Final   MRSA PCR SCREENING     Status: Normal   Collection Time   11/08/11  2:28 PM      Component Value Range Status Comment   MRSA by PCR NEGATIVE  NEGATIVE  Final     Studies/Results: No results found.  Medications: I have reviewed the patient's current medications. Scheduled Meds:    . ciprofloxacin  250 mg Oral BID  . mercaptopurine  50 mg Oral Daily  . metroNIDAZOLE  500 mg Oral Q8H  . pantoprazole  40 mg Oral BID AC  . DISCONTD: pantoprazole (PROTONIX) IV  40 mg Intravenous Q12H  . DISCONTD: piperacillin-tazobactam (ZOSYN)  IV  3.375 g Intravenous Q8H   Continuous Infusions:    .  sodium chloride 0.9 % 1,000 mL with potassium chloride 20 mEq infusion    . DISCONTD: 0.9 % NaCl with KCl 40 mEq / L 100 mL/hr at 11/13/11 0115  . DISCONTD: 0.9 % NaCl with KCl 40 mEq / L    . DISCONTD: lactated ringers 1,000 mL (11/12/11 1305)  . DISCONTD: lactated ringers Stopped (11/12/11 1500)   PRN Meds:.diphenhydrAMINE, HYDROmorphone (DILAUDID) injection, ketorolac, LORazepam, ondansetron (ZOFRAN) IV, ondansetron, oxyCODONE-acetaminophen, DISCONTD: bupivacaine-EPINEPHrine, DISCONTD: butamben-tetracaine-benzocaine, DISCONTD: diphenhydrAMINE, DISCONTD: fentaNYL, DISCONTD: fentaNYL, DISCONTD: midazolam  Assessment/Plan: Antibiotics:  Vancomycin  11/08/11---> 11/11/2011 Zosyn 11/10/11--->11/13/2011 Ciprofloxacin 11/13/2011 ----> Metronidazole 11/13/2011 ---->  Medical Consultants:  Dr. Erskine Emery and Dr. [name], Gastroenterology  Dr. Fanny Skates and Dr. Donne Hazel, Surgery  SBO (small bowel obstruction)  Confirmed by CT scan on 11/08/11.  Seen by Dr. Dalbert Batman on 11/08/11 who recommended continuing bowel rest, and NG tube suction.  NG tube dislodged 11/09/11, OK to leave out unless she has recurrent N/V.  Abdominal films done 11/09/11: Stable; Repeated 11/10/11: Showed SBO resolution.  Potassium replaced.  GI consultation performed by Dr. Deatra Ina on 11/09/11: SBO felt to be mechanical, hold 6-MP until able to take PO's.  Diet advanced to clears 11/10/11 and 6-MP resumed.  Taken for EGD 11/11/11. Normal. No evidence of active Crohn's disease.  Advanced to solid diet on 11/12/2011.  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  No evidence of abscess on CT.  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  Nasal swab for PCR MRSA negative.  Zosyn added 11/10/11 due to recurrent induration/pain to cover fistulizing gram negative flora,, Zosyn transitioned to Cipro and metronidazole on 11/13/2011, defined for 7 days. Patient was taken to surgery 11/12/2011 for incision and drainage of perirectal abscess.  Patient also had rigid proctoscopy.  Continue wound care. May need home health RN for dressing changes prior to discharge.  Acute renal failure  Etiology unclear.  Patient on antibiotics a less likely be due to urinary tract infection.  May be related to anesthesia from surgery yesterday?  Continue fluids. Continue to monitor.  Fever  Etiology unclear, may be related to the surgery from yesterday.  Continue to monitor. Only one episode.  [place] on hold, 6-MP resumed.  Seen by Dr. Deatra Ina on 11/09/11, Dr. [name] now following.  Hypokalemia  Replaced in IVF.  Anxiety    Continue when necessary Ativan.  Chronic iron deficiency anemia  Known history of menorrhagia.  Hemoglobin stable.  Code Status: Full  Family Communication: None, at bedside.  Disposition Plan: Home in 24 hours.    LOS: 5 days  Ignace Mandigo A, MD 11/13/2011, 12:06 PM

## 2011-11-13 NOTE — Progress Notes (Signed)
Patient iv infiltrated, attempted restart in right forearm, unable to start. Patient stated she was a hard stick and always had to have the iv team do her. Did not want anyone to try again. Iv team paged

## 2011-11-14 ENCOUNTER — Inpatient Hospital Stay (HOSPITAL_COMMUNITY): Payer: 59

## 2011-11-14 DIAGNOSIS — K5000 Crohn's disease of small intestine without complications: Secondary | ICD-10-CM

## 2011-11-14 DIAGNOSIS — R509 Fever, unspecified: Secondary | ICD-10-CM

## 2011-11-14 DIAGNOSIS — L0231 Cutaneous abscess of buttock: Secondary | ICD-10-CM

## 2011-11-14 DIAGNOSIS — N179 Acute kidney failure, unspecified: Secondary | ICD-10-CM

## 2011-11-14 DIAGNOSIS — L03317 Cellulitis of buttock: Secondary | ICD-10-CM

## 2011-11-14 LAB — BASIC METABOLIC PANEL
BUN: 6 mg/dL (ref 6–23)
CO2: 22 mEq/L (ref 19–32)
Calcium: 8.6 mg/dL (ref 8.4–10.5)
Creatinine, Ser: 1.82 mg/dL — ABNORMAL HIGH (ref 0.50–1.10)
Glucose, Bld: 88 mg/dL (ref 70–99)

## 2011-11-14 LAB — CBC
Hemoglobin: 8.9 g/dL — ABNORMAL LOW (ref 12.0–15.0)
MCH: 25.6 pg — ABNORMAL LOW (ref 26.0–34.0)
MCV: 81.8 fL (ref 78.0–100.0)
RBC: 3.47 MIL/uL — ABNORMAL LOW (ref 3.87–5.11)

## 2011-11-14 MED ORDER — SODIUM CHLORIDE 0.9 % IV SOLN
INTRAVENOUS | Status: DC
  Administered 2011-11-14: 1000 mL via INTRAVENOUS
  Administered 2011-11-15 – 2011-11-16 (×2): via INTRAVENOUS

## 2011-11-14 MED ORDER — ACETAMINOPHEN 325 MG PO TABS
650.0000 mg | ORAL_TABLET | ORAL | Status: DC | PRN
Start: 2011-11-14 — End: 2011-11-16
  Administered 2011-11-14 (×2): 650 mg via ORAL
  Filled 2011-11-14 (×3): qty 2

## 2011-11-14 NOTE — Progress Notes (Signed)
CARE MANAGE MENT UTILIZATION REVIEW NOTE 11/14/2011     Patient:  [name], [name]   Account Number:  [id number]  Documented by:  Suanne Marker Kaisa Wofford   Per Ur Regulation Reviewed for med. necessity/level of care/duration of stay

## 2011-11-14 NOTE — Progress Notes (Signed)
Her obstruction is resolved, perirectal abscess resolving, unsure of fever source, agree with above

## 2011-11-14 NOTE — Progress Notes (Signed)
Subjective: Patient fever of 103 today.  Slowly eating better.  Objective: Vital signs in last 24 hours: Filed Vitals:   11/13/11 2144 11/14/11 0148 11/14/11 0600 11/14/11 1112  BP: 139/104 151/93 145/91   Pulse: 104 101 93   Temp: 99.7 F (37.6 C) 103 F (39.4 C) 99.7 F (37.6 C) 98.9 F (37.2 C)  TempSrc: Oral Oral Oral   Resp: 20 20 18    Height:      Weight:      SpO2: 98% 100% 91%    Weight change:   Intake/Output Summary (Last 24 hours) at 11/14/11 1131 Last data filed at 11/13/11 1800  Gross per 24 hour  Intake   1620 ml  Output    400 ml  Net   1220 ml    Physical Exam: General: Awake, Oriented, No acute distress. HEENT: EOMI. Neck: Supple CV: S1 and S2 Lungs: Clear to ascultation bilaterally Abdomen: Soft, nontender, Nondistended, +bowel sounds. Ext: Good pulses. Trace edema.  Lab Results:  Basename 11/14/11 0439 11/13/11 1045 11/13/11 0430  NA 135 -- 135  K 4.5 -- 3.9  CL 103 -- 100  CO2 22 -- 26  GLUCOSE 88 -- 84  BUN 6 -- 7  CREATININE 1.82* 1.95* --  CALCIUM 8.6 -- 8.7  MG -- -- --  PHOS -- -- --   No results found for this basename: AST:2,ALT:2,ALKPHOS:2,BILITOT:2,PROT:2,ALBUMIN:2 in the last 72 hours No results found for this basename: LIPASE:2,AMYLASE:2 in the last 72 hours  Basename 11/14/11 0439 11/13/11 0430  WBC 11.9* 12.0*  NEUTROABS -- --  HGB 8.9* 8.8*  HCT 28.4* 28.4*  MCV 81.8 82.3  PLT 470* 485*   No results found for this basename: CKTOTAL:3,CKMB:3,CKMBINDEX:3,TROPONINI:3 in the last 72 hours No components found with this basename: POCBNP:3 No results found for this basename: DDIMER:2 in the last 72 hours No results found for this basename: HGBA1C:2 in the last 72 hours No results found for this basename: CHOL:2,HDL:2,LDLCALC:2,TRIG:2,CHOLHDL:2,LDLDIRECT:2 in the last 72 hours No results found for this basename: TSH,T4TOTAL,FREET3,T3FREE,THYROIDAB in the last 72 hours No results found for this basename:  VITAMINB12:2,FOLATE:2,FERRITIN:2,TIBC:2,IRON:2,RETICCTPCT:2 in the last 72 hours  Micro Results: Recent Results (from the past 240 hour(s))  URINE CULTURE     Status: Normal   Collection Time   11/08/11  8:43 AM      Component Value Range Status Comment   Specimen Description URINE, CLEAN CATCH   Final    Special Requests NONE   Final    Culture  Setup Time [id number]   Final    Colony Count 50,000 COLONIES/ML   Final    Culture     Final    Value: Multiple bacterial morphotypes present, none predominant. Suggest appropriate recollection if clinically indicated.   Report Status 11/09/2011 FINAL   Final   MRSA PCR SCREENING     Status: Normal   Collection Time   11/08/11  2:28 PM      Component Value Range Status Comment   MRSA by PCR NEGATIVE  NEGATIVE  Final     Studies/Results: US Renal  11/14/2011  *RADIOLOGY REPORT*  Clinical Data:  Elevated creatinine and fever.  RENAL/URINARY TRACT ULTRASOUND COMPLETE  Comparison:  11/08/2011  Findings:  Right Kidney:  Normal in size and parenchymal echogenicity.  No evidence of mass or hydronephrosis.  Left Kidney:  Normal in size and parenchymal echogenicity.  No evidence of mass or hydronephrosis.  Bladder:  Appears normal for degree of bladder distention.  Other:  Free fluid noted within the pelvis.  The patient is also seen to have a cyst on the right ovary.  IMPRESSION:  1.  No evidence for obstructive uropathy. 2.  Nonspecific free fluid within the pelvis.  Original Report Authenticated By: Angelita Ingles, M.D.    Medications: I have reviewed the patient's current medications. Scheduled Meds:    . ciprofloxacin  250 mg Oral BID  . mercaptopurine  50 mg Oral Daily  . metroNIDAZOLE  500 mg Oral Q8H  . pantoprazole  40 mg Oral BID AC   Continuous Infusions:    . sodium chloride 0.9 % 1,000 mL with potassium chloride 20 mEq infusion    . DISCONTD: 0.9 % NaCl with KCl 40 mEq / L 100 mL/hr at 11/13/11 0115  . DISCONTD: 0.9 % NaCl with  KCl 40 mEq / L     PRN Meds:.acetaminophen, diphenhydrAMINE, HYDROmorphone (DILAUDID) injection, ketorolac, LORazepam, ondansetron (ZOFRAN) IV, ondansetron, oxyCODONE-acetaminophen  Assessment/Plan: Antibiotics:  Vancomycin 11/08/11---> 11/11/2011 Zosyn 11/10/11--->11/13/2011 Ciprofloxacin 11/13/2011 ----> Metronidazole 11/13/2011 ---->  Medical Consultants:  Dr. Erskine Emery and Dr. [name], Gastroenterology  Dr. Fanny Skates and Dr. Donne Hazel, Surgery  SBO (small bowel obstruction)  Confirmed by CT scan on 11/08/11.  Seen by Dr. Dalbert Batman on 11/08/11 who recommended continuing bowel rest, and NG tube suction.  NG tube dislodged 11/09/11, OK to leave out unless she has recurrent N/V. Abdominal films done 11/09/11: Stable; Repeated 11/10/11: Showed SBO resolution.  GI consultation performed by Dr. Deatra Ina on 11/09/11: SBO felt to be mechanical, initially held 6-MP until able to take PO's.  Diet advanced to clears 11/10/11 and 6-MP resumed.  Taken for EGD 11/11/11. Normal. No evidence of active Crohn's disease.  Advanced to solid diet on 11/12/2011.  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  No evidence of abscess on CT.  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  Nasal swab for PCR MRSA negative.  Zosyn added 11/10/11 due to recurrent induration/pain to cover fistulizing gram negative flora, Zosyn transitioned to Cipro and metronidazole on 11/13/2011, defined for 7 days. Patient was taken to surgery 11/12/2011 for incision and drainage of perirectal abscess.  Patient also had rigid proctoscopy.  Continue wound care. May need home health RN for dressing changes prior to discharge.  Acute renal failure  Etiology unclear.  Patient on antibiotics a less likely be due to urinary tract infection.  May be related to anesthesia from surgery on 11/12/2011?  Improved. Renal ultrasound on 11/14/2011 showed no evidence for obstructive  uropathy. Continue to monitor.  Fever  Etiology unclear.  Blood cultures x2 drawn on 11/14/2011.   [place] on hold, 6-MP resumed.  Seen by Dr. Deatra Ina on 11/09/11, Dr. [name] now following.  Hypokalemia  Replaced in IVF. Resolved.  Anxiety  Continue when necessary Ativan.  Chronic iron deficiency anemia  Known history of menorrhagia.  Hemoglobin stable.  Code Status: Full  Family Communication: Family at bedside.  Disposition Plan: Home in 24 hours if afebrile and with improvement in renal function.   LOS: 6 days  Jedediah Noda A, MD 11/14/2011, 11:31 AM

## 2011-11-14 NOTE — Progress Notes (Signed)
Subjective Fever 103 last night, could not sleep , denies abd. Pain, tolerating full liquids  Objective: Vital signs in last 24 hours: Temp:  [98.5 F (36.9 C)-103 F (39.4 C)] 99.7 F (37.6 C) (05/03 0600) Pulse Rate:  [85-104] 93  (05/03 0600) Resp:  [18-20] 18  (05/03 0600) BP: (135-152)/(78-104) 145/91 mmHg (05/03 0600) SpO2:  [91 %-100 %] 91 % (05/03 0600) Last BM Date: 11/12/11 General:   Alert,  pleasant, cooperative in NAD Head:  Normocephalic and atraumatic. Eyes:  Sclera clear, no icterus.   Conjunctiva pink. Mouth:  No deformity or lesions, dentition normal. Neck:  Supple; no masses or thyromegaly. Heart:  Regular rate and rhythm; no murmurs, clicks, rubs,  or gallops. Lungs:  No wheezes or rales Abdomen soft, minimal tenderness in the epigastrium, normal bowl sounds, Rectal: perineal induration much smaller and softer and less tender, no apparent drainage Msk:  Symmetrical without gross deformities. Normal posture. Pulses:  Normal pulses noted. Extremities:  Without clubbing or edema. Neurologic:  Alert and  oriented x4;  grossly normal neurologically. Skin:  Intact without significant lesions or rashes.  Intake/Output from previous day: 05/02 0701 - 05/03 0700 In: 1640 [P.O.:340; I.V.:1300] Out: 400 [Urine:400] Intake/Output this shift:    Lab Results:  Basename 11/14/11 0439 11/13/11 0430  WBC 11.9* 12.0*  HGB 8.9* 8.8*  HCT 28.4* 28.4*  PLT 470* 485*   BMET  Basename 11/14/11 0439 11/13/11 1045 11/13/11 0430  NA 135 -- 135  K 4.5 -- 3.9  CL 103 -- 100  CO2 22 -- 26  GLUCOSE 88 -- 84  BUN 6 -- 7  CREATININE 1.82* 1.95* 1.91*  CALCIUM 8.6 -- 8.7   LFT No results found for this basename: PROT,ALBUMIN,AST,ALT,ALKPHOS,BILITOT,BILIDIR,IBILI in the last 72 hours PT/INR No results found for this basename: LABPROT:2,INR:2 in the last 72 hours Hepatitis Panel No results found for this basename: HEPBSAG,HCVAB,HEPAIGM,HEPBIGM in the last 72  hours  Studies/Results: No results found.   ASSESSMENT:   Principal Problem:  *SBO (small bowel obstruction) Active Problems:  ANEMIA, IRON DEFICIENCY, CHRONIC  CROHN'S DISEASE  Hypokalemia  Anxiety  Cellulitis fever, Elevated creatinine    PLAN:   Ultrsound of the kidneys, r/o pyelo Dr Donne Hazel to repack the wound Advance diet to regular Continue Cipro/Flagyl Blood cultures from last night     LOS: 6 days   [name]  11/14/2011, 7:48 AM

## 2011-11-14 NOTE — Progress Notes (Signed)
2 Days Post-Op  Subjective: Peri-rectal wound packing fell out overnight. I  Re-packed this am with small amount of iodoform gauze, but was very painful for her. She might need Sitz bath to help with next dressing change.   Objective: Vital signs in last 24 hours: Temp:  [98.5 F (36.9 C)-103 F (39.4 C)] 99.7 F (37.6 C) (05/03 0600) Pulse Rate:  [85-104] 93  (05/03 0600) Resp:  [18-20] 18  (05/03 0600) BP: (135-151)/(78-104) 145/91 mmHg (05/03 0600) SpO2:  [91 %-100 %] 91 % (05/03 0600) Last BM Date: 11/12/11  Intake/Output from previous day: 05/02 0701 - 05/03 0700 In: 1640 [P.O.:340; I.V.:1300] Out: 400 [Urine:400] Intake/Output this shift:    Incision/Wound: with scant thin drainage at most. Peri-wound mildly tender, but no induration or erythema.   Lab Results:   Basename 11/14/11 0439 11/13/11 0430  WBC 11.9* 12.0*  HGB 8.9* 8.8*  HCT 28.4* 28.4*  PLT 470* 485*   BMET  Basename 11/14/11 0439 11/13/11 1045 11/13/11 0430  NA 135 -- 135  K 4.5 -- 3.9  CL 103 -- 100  CO2 22 -- 26  GLUCOSE 88 -- 84  BUN 6 -- 7  CREATININE 1.82* 1.95* --  CALCIUM 8.6 -- 8.7   PT/INR No results found for this basename: LABPROT:2,INR:2 in the last 72 hours ABG No results found for this basename: PHART:2,PCO2:2,PO2:2,HCO3:2 in the last 72 hours  Studies/Results: US Renal  11/14/2011  *RADIOLOGY REPORT*  Clinical Data:  Elevated creatinine and fever.  RENAL/URINARY TRACT ULTRASOUND COMPLETE  Comparison:  11/08/2011  Findings:  Right Kidney:  Normal in size and parenchymal echogenicity.  No evidence of mass or hydronephrosis.  Left Kidney:  Normal in size and parenchymal echogenicity.  No evidence of mass or hydronephrosis.  Bladder:  Appears normal for degree of bladder distention.  Other:  Free fluid noted within the pelvis.  The patient is also seen to have a cyst on the right ovary.  IMPRESSION:  1.  No evidence for obstructive uropathy. 2.  Nonspecific free fluid within the pelvis.   Original Report Authenticated By: Angelita Ingles, M.D.    Anti-infectives: Anti-infectives     Start     Dose/Rate Route Frequency Ordered Stop   11/13/11 1400   metroNIDAZOLE (FLAGYL) tablet 500 mg        500 mg Oral 3 times per day 11/13/11 1000     11/13/11 1100   ciprofloxacin (CIPRO) tablet 250 mg        250 mg Oral 2 times daily 11/13/11 1000     11/10/11 1000   piperacillin-tazobactam (ZOSYN) IVPB 3.375 g  Status:  Discontinued        3.375 g 12.5 mL/hr over 240 Minutes Intravenous Every 8 hours 11/10/11 0853 11/13/11 1000   11/09/11 2200   vancomycin (VANCOCIN) 1,250 mg in sodium chloride 0.9 % 250 mL IVPB  Status:  Discontinued        1,250 mg 166.7 mL/hr over 90 Minutes Intravenous Every 8 hours 11/09/11 1433 11/11/11 1651   11/08/11 1400   vancomycin (VANCOCIN) IVPB 1000 mg/200 mL premix  Status:  Discontinued        1,000 mg 200 mL/hr over 60 Minutes Intravenous Every 8 hours 11/08/11 1352 11/09/11 1433          Assessment/Plan: s/p Procedure(s) (LRB): EXAM UNDER ANESTHESIA (N/A) PROCTOSCOPY (N/A) IRRIGATION AND DEBRIDEMENT PERIRECTAL ABSCESS (N/A) Peri-rectal abscess- S/P I&D - doing well from general surgery standpoint.   LOS: 6  days    Gwenevere Goga,PA-C Pager [phone] General Trauma Pager [phone]

## 2011-11-15 ENCOUNTER — Inpatient Hospital Stay (HOSPITAL_COMMUNITY): Payer: 59

## 2011-11-15 DIAGNOSIS — K5000 Crohn's disease of small intestine without complications: Secondary | ICD-10-CM

## 2011-11-15 DIAGNOSIS — L03317 Cellulitis of buttock: Secondary | ICD-10-CM

## 2011-11-15 DIAGNOSIS — R110 Nausea: Secondary | ICD-10-CM

## 2011-11-15 DIAGNOSIS — R509 Fever, unspecified: Secondary | ICD-10-CM

## 2011-11-15 DIAGNOSIS — N179 Acute kidney failure, unspecified: Secondary | ICD-10-CM

## 2011-11-15 DIAGNOSIS — L0231 Cutaneous abscess of buttock: Secondary | ICD-10-CM

## 2011-11-15 LAB — BASIC METABOLIC PANEL
Calcium: 8.3 mg/dL — ABNORMAL LOW (ref 8.4–10.5)
GFR calc non Af Amer: 42 mL/min — ABNORMAL LOW (ref >90)
Glucose, Bld: 88 mg/dL (ref 70–99)
Sodium: 135 mEq/L (ref 135–145)

## 2011-11-15 LAB — CBC
Hemoglobin: 8.3 g/dL — ABNORMAL LOW (ref 12.0–15.0)
MCH: 25.3 pg — ABNORMAL LOW (ref 26.0–34.0)
MCHC: 31.2 g/dL (ref 30.0–36.0)

## 2011-11-15 NOTE — Progress Notes (Signed)
[facility] Progress Note  SUBJECTIVE: had some nausea and vomiting x1 last night. Still has some nausea this am.   OBJECTIVE:  Vital signs in last 24 hours: Temp:  [98.6 F (37 C)-101.4 F (38.6 C)] 99.9 F (37.7 C) (05/04 0500) Pulse Rate:  [75-121] 103  (05/04 0500) Resp:  [18] 18  (05/04 0500) BP: (144-152)/(62-97) 144/62 mmHg (05/04 0500) SpO2:  [99 %-100 %] 100 % (05/04 0500) Last BM Date: 11/15/11 General:    Pleasant black female in NAD Heart:  Regular rate and rhythm; no murmurs Lungs: Respirations even and unlabored, lungs CTA bilaterally Abdomen:  Soft, nontender and nondistended. Normal bowel sounds. Extremities:  Without edema. Neurologic:  Alert and oriented,  grossly normal neurologically. Psych:  Cooperative. Normal mood and affect.   Lab Results:  Basename 11/15/11 0437 11/14/11 0439 11/13/11 0430  WBC 12.6* 11.9* 12.0*  HGB 8.3* 8.9* 8.8*  HCT 26.6* 28.4* 28.4*  PLT 420* 470* 485*   BMET  Basename 11/15/11 0437 11/14/11 0439 11/13/11 1045 11/13/11 0430  NA 135 135 -- 135  K 3.8 4.5 -- 3.9  CL 103 103 -- 100  CO2 20 22 -- 26  GLUCOSE 88 88 -- 84  BUN 6 6 -- 7  CREATININE 1.70* 1.82* 1.95* --  CALCIUM 8.3* 8.6 -- 8.7   Studies/Results: US Renal  11/14/2011  *RADIOLOGY REPORT*  Clinical Data:  Elevated creatinine and fever.  RENAL/URINARY TRACT ULTRASOUND COMPLETE  Comparison:  11/08/2011  Findings:  Right Kidney:  Normal in size and parenchymal echogenicity.  No evidence of mass or hydronephrosis.  Left Kidney:  Normal in size and parenchymal echogenicity.  No evidence of mass or hydronephrosis.  Bladder:  Appears normal for degree of bladder distention.  Other:  Free fluid noted within the pelvis.  The patient is also seen to have a cyst on the right ovary.  IMPRESSION:  1.  No evidence for obstructive uropathy. 2.  Nonspecific free fluid within the pelvis.  Original Report Authenticated By: Angelita Ingles, M.D.     ASSESSMENT /  PLAN:  1. Crohn's disease, s/p I&D of perirectal abscess this admission. Abscess resolving per surgery's note yesterday.  2.  Fever, mild leukocytosis. Temp 101.4 at 3am. Blood cultures pending. Etiology?  No dysuria, no respiratory symptoms. Her stools are loose and she is having excessive gas. Need to exclude C-Difficile as source of fevers.   3.  Acute renal failure. Elevated creatinine, improving and down to 1.7 today. Renal ultrasound negative.  4. SBO, resolved. 5. DVT prophylaxis, she is high risk for DVTs. Not on blood thinners so she needs SCDs. Will order.    LOS: 7 days   [name]  11/15/2011, 9:26 AM

## 2011-11-15 NOTE — Progress Notes (Signed)
I have reviewed the above note, examined the patient and agree with plan of treatment.fever continues but no locus, WBC's up. Will obtain limited CT of the pelvis to r/o active process in the pelvis.advance diet

## 2011-11-15 NOTE — Progress Notes (Signed)
3 Days Post-Op  Subjective: A little nausea.  Having loose BMs.    Objective: Vital signs in last 24 hours: Temp:  [98.6 F (37 C)-101.4 F (38.6 C)] 99.9 F (37.7 C) (05/04 0500) Pulse Rate:  [75-121] 103  (05/04 0500) Resp:  [18] 18  (05/04 0500) BP: (144-152)/(62-97) 144/62 mmHg (05/04 0500) SpO2:  [99 %-100 %] 100 % (05/04 0500) Last BM Date: 11/15/11  Intake/Output from previous day: 05/03 0701 - 05/04 0700 In: 1200 [I.V.:1200] Out: -  Intake/Output this shift:    PE: Anorectal-wound clean, packed with saline moistened gauze  Lab Results:   Basename 11/15/11 0437 11/14/11 0439  WBC 12.6* 11.9*  HGB 8.3* 8.9*  HCT 26.6* 28.4*  PLT 420* 470*   BMET  Basename 11/15/11 0437 11/14/11 0439  NA 135 135  K 3.8 4.5  CL 103 103  CO2 20 22  GLUCOSE 88 88  BUN 6 6  CREATININE 1.70* 1.82*  CALCIUM 8.3* 8.6   PT/INR No results found for this basename: LABPROT:2,INR:2 in the last 72 hours Comprehensive Metabolic Panel:    Component Value Date/Time   NA 135 11/15/2011 0437   K 3.8 11/15/2011 0437   CL 103 11/15/2011 0437   CO2 20 11/15/2011 0437   BUN 6 11/15/2011 0437   CREATININE 1.70* 11/15/2011 0437   GLUCOSE 88 11/15/2011 0437   CALCIUM 8.3* 11/15/2011 0437   AST 14 11/08/2011 0725   ALT 10 11/08/2011 0725   ALKPHOS 55 11/08/2011 0725   BILITOT 0.4 11/08/2011 0725   PROT 7.9 11/08/2011 0725   ALBUMIN 3.4* 11/08/2011 0725     Studies/Results: US Renal  11/14/2011  *RADIOLOGY REPORT*  Clinical Data:  Elevated creatinine and fever.  RENAL/URINARY TRACT ULTRASOUND COMPLETE  Comparison:  11/08/2011  Findings:  Right Kidney:  Normal in size and parenchymal echogenicity.  No evidence of mass or hydronephrosis.  Left Kidney:  Normal in size and parenchymal echogenicity.  No evidence of mass or hydronephrosis.  Bladder:  Appears normal for degree of bladder distention.  Other:  Free fluid noted within the pelvis.  The patient is also seen to have a cyst on the right ovary.   IMPRESSION:  1.  No evidence for obstructive uropathy. 2.  Nonspecific free fluid within the pelvis.  Original Report Authenticated By: Angelita Ingles, M.D.    Anti-infectives: Anti-infectives     Start     Dose/Rate Route Frequency Ordered Stop   11/13/11 1400   metroNIDAZOLE (FLAGYL) tablet 500 mg        500 mg Oral 3 times per day 11/13/11 1000     11/13/11 1100   ciprofloxacin (CIPRO) tablet 250 mg        250 mg Oral 2 times daily 11/13/11 1000     11/10/11 1000   piperacillin-tazobactam (ZOSYN) IVPB 3.375 g  Status:  Discontinued        3.375 g 12.5 mL/hr over 240 Minutes Intravenous Every 8 hours 11/10/11 0853 11/13/11 1000   11/09/11 2200   vancomycin (VANCOCIN) 1,250 mg in sodium chloride 0.9 % 250 mL IVPB  Status:  Discontinued        1,250 mg 166.7 mL/hr over 90 Minutes Intravenous Every 8 hours 11/09/11 1433 11/11/11 1651   11/08/11 1400   vancomycin (VANCOCIN) IVPB 1000 mg/200 mL premix  Status:  Discontinued        1,000 mg 200 mL/hr over 60 Minutes Intravenous Every 8 hours 11/08/11 1352 11/09/11 1433  Assessment Principal Problem:  *SBO (small bowel obstruction)-resolved Active Problems:  Anorectal abscess-drained; wound looks good.  CROHN'S DISEASE      LOS: 7 days   Plan: Continue wound care.   Margurite Duffy J 11/15/2011

## 2011-11-15 NOTE — Progress Notes (Signed)
Subjective: Patient still has persistent spikes in fever, she feels clinically improved overall.  Had some loose bowel movements today.  Objective: Vital signs in last 24 hours: Filed Vitals:   11/14/11 1831 11/14/11 2119 11/15/11 0300 11/15/11 0500  BP:  149/97 152/86 144/62  Pulse:  77 121 103  Temp: 100 F (37.8 C) 98.6 F (37 C) 101.4 F (38.6 C) 99.9 F (37.7 C)  TempSrc:  Oral Axillary Oral  Resp:  18 18 18   Height:      Weight:      SpO2:  100%  100%   Weight change:   Intake/Output Summary (Last 24 hours) at 11/15/11 1352 Last data filed at 11/15/11 0600  Gross per 24 hour  Intake   1200 ml  Output      0 ml  Net   1200 ml    Physical Exam: General: Awake, Oriented, No acute distress. HEENT: EOMI. Neck: Supple CV: S1 and S2 Lungs: Clear to ascultation bilaterally Abdomen: Soft, nontender, Nondistended, +bowel sounds. Ext: Good pulses. Trace edema.  Lab Results:  Basename 11/15/11 0437 11/14/11 0439  NA 135 135  K 3.8 4.5  CL 103 103  CO2 20 22  GLUCOSE 88 88  BUN 6 6  CREATININE 1.70* 1.82*  CALCIUM 8.3* 8.6  MG -- --  PHOS -- --   No results found for this basename: AST:2,ALT:2,ALKPHOS:2,BILITOT:2,PROT:2,ALBUMIN:2 in the last 72 hours No results found for this basename: LIPASE:2,AMYLASE:2 in the last 72 hours  Basename 11/15/11 0437 11/14/11 0439  WBC 12.6* 11.9*  NEUTROABS -- --  HGB 8.3* 8.9*  HCT 26.6* 28.4*  MCV 81.1 81.8  PLT 420* 470*   No results found for this basename: CKTOTAL:3,CKMB:3,CKMBINDEX:3,TROPONINI:3 in the last 72 hours No components found with this basename: POCBNP:3 No results found for this basename: DDIMER:2 in the last 72 hours No results found for this basename: HGBA1C:2 in the last 72 hours No results found for this basename: CHOL:2,HDL:2,LDLCALC:2,TRIG:2,CHOLHDL:2,LDLDIRECT:2 in the last 72 hours No results found for this basename: TSH,T4TOTAL,FREET3,T3FREE,THYROIDAB in the last 72 hours No results found for  this basename: VITAMINB12:2,FOLATE:2,FERRITIN:2,TIBC:2,IRON:2,RETICCTPCT:2 in the last 72 hours  Micro Results: Recent Results (from the past 240 hour(s))  URINE CULTURE     Status: Normal   Collection Time   11/08/11  8:43 AM      Component Value Range Status Comment   Specimen Description URINE, CLEAN CATCH   Final    Special Requests NONE   Final    Culture  Setup Time [id number]   Final    Colony Count 50,000 COLONIES/ML   Final    Culture     Final    Value: Multiple bacterial morphotypes present, none predominant. Suggest appropriate recollection if clinically indicated.   Report Status 11/09/2011 FINAL   Final   MRSA PCR SCREENING     Status: Normal   Collection Time   11/08/11  2:28 PM      Component Value Range Status Comment   MRSA by PCR NEGATIVE  NEGATIVE  Final   CULTURE, BLOOD (ROUTINE X 2)     Status: Normal (Preliminary result)   Collection Time   11/14/11  4:39 AM      Component Value Range Status Comment   Specimen Description BLOOD LEFT ARM   Final    Special Requests BOTTLES DRAWN AEROBIC AND ANAEROBIC [facility]   Final    Culture  Setup Time [id number]   Final    Culture  Final    Value:        BLOOD CULTURE RECEIVED NO GROWTH TO DATE CULTURE WILL BE HELD FOR 5 DAYS BEFORE ISSUING A FINAL NEGATIVE REPORT   Report Status PENDING   Incomplete   CULTURE, BLOOD (ROUTINE X 2)     Status: Normal (Preliminary result)   Collection Time   11/14/11  4:43 AM      Component Value Range Status Comment   Specimen Description BLOOD LEFT HAND   Final    Special Requests BOTTLES DRAWN AEROBIC AND ANAEROBIC 5CC   Final    Culture  Setup Time [id number]   Final    Culture     Final    Value:        BLOOD CULTURE RECEIVED NO GROWTH TO DATE CULTURE WILL BE HELD FOR 5 DAYS BEFORE ISSUING A FINAL NEGATIVE REPORT   Report Status PENDING   Incomplete     Studies/Results: US Renal  11/14/2011  *RADIOLOGY REPORT*  Clinical Data:  Elevated creatinine and fever.  RENAL/URINARY TRACT  ULTRASOUND COMPLETE  Comparison:  11/08/2011  Findings:  Right Kidney:  Normal in size and parenchymal echogenicity.  No evidence of mass or hydronephrosis.  Left Kidney:  Normal in size and parenchymal echogenicity.  No evidence of mass or hydronephrosis.  Bladder:  Appears normal for degree of bladder distention.  Other:  Free fluid noted within the pelvis.  The patient is also seen to have a cyst on the right ovary.  IMPRESSION:  1.  No evidence for obstructive uropathy. 2.  Nonspecific free fluid within the pelvis.  Original Report Authenticated By: Angelita Ingles, M.D.    Medications: I have reviewed the patient's current medications. Scheduled Meds:    . ciprofloxacin  250 mg Oral BID  . mercaptopurine  50 mg Oral Daily  . metroNIDAZOLE  500 mg Oral Q8H  . pantoprazole  40 mg Oral BID AC   Continuous Infusions:    . sodium chloride 50 mL/hr at 11/15/11 0932   PRN Meds:.acetaminophen, diphenhydrAMINE, HYDROmorphone (DILAUDID) injection, LORazepam, ondansetron (ZOFRAN) IV, ondansetron, oxyCODONE-acetaminophen  Assessment/Plan: Antibiotics:  Vancomycin 11/08/11---> 11/11/2011 Zosyn 11/10/11--->11/13/2011 Ciprofloxacin 11/13/2011 ----> Metronidazole 11/13/2011 ---->  Medical Consultants:  Dr. Erskine Emery and Dr. [name], Gastroenterology  Dr. Fanny Skates, Dr. Donne Hazel, Dr. Zella Richer, Surgery  SBO (small bowel obstruction)  Resolved. Confirmed by CT scan on 11/08/11. Seen by Dr. Dalbert Batman on 11/08/11 who recommended continuing bowel rest, and NG tube suction. NG tube dislodged 11/09/11, was left out given she was not having any nausea or vomiting.  Abdominal films done 11/09/11: Stable; Repeated 11/10/11: Showed SBO resolution.  GI consultation performed by Dr. Deatra Ina on 11/09/11: SBO felt to be mechanical, initially held 6-MP which was restarted once patient able to tolerate oral intake.   Diet advanced to clears 11/10/11 and 6-MP resumed.  EGD 11/11/11. Normal. No evidence of  active Crohn's disease.  Advanced to solid diet on 11/12/2011.  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  No evidence of abscess on CT.  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  Nasal swab for PCR MRSA negative.  Zosyn added 11/10/11 due to recurrent induration/pain to cover fistulizing gram negative flora, Zosyn transitioned to Cipro and metronidazole on 11/13/2011, defined for 7 days. Patient was taken to surgery 11/12/2011 for incision and drainage of perirectal abscess.  Patient also had rigid proctoscopy.  Continue wound care. May need home health RN for dressing changes prior to  discharge.  Acute renal failure  Etiology unclear, improved.  Renal ultrasound on 11/14/2011 showed no evidence for obstructive uropathy. Continue to monitor.  Patient may have had contrast-induced nephropathy which is resolving.  Fever  Etiology unclear.  Patient is immunocompromised.  Blood cultures x2 drawn on 11/14/2011, show no growth to date.  Given diarrhea C. difficile has been ordered and pending.  Also discussed with the patient about getting a chest x-ray she at this time does not want a chest x-ray until the stool studies come back.  [facility] on hold, 6-MP resumed.  Seen by Dr. Deatra Ina on 11/09/11, Dr. [name] now following.  Hypokalemia  Replaced in IVF. Resolved.  Anxiety  Continue when necessary Ativan.  Chronic iron deficiency anemia  Known history of menorrhagia.  Hemoglobin stable.  Code Status: Full  Family Communication: No family by bedside.  Disposition Plan: Pending, if afebrile and with improvement in renal function would consider discharging the patient.   LOS: 7 days  Dylen Mcelhannon A, MD 11/15/2011, 1:52 PM

## 2011-11-15 NOTE — Progress Notes (Signed)
MRI of the pelvis reviewed, inflammatory process in the ischiorectal area appears smaller, no true abcess, discussed with radiologist possibility of drainage but nothing that can be drained. So far no fever tonight, will continue oral Cipro and Flagyl. Consider discharge if  fever stays down.

## 2011-11-16 DIAGNOSIS — K5000 Crohn's disease of small intestine without complications: Secondary | ICD-10-CM

## 2011-11-16 DIAGNOSIS — N179 Acute kidney failure, unspecified: Secondary | ICD-10-CM | POA: Diagnosis not present

## 2011-11-16 DIAGNOSIS — R509 Fever, unspecified: Secondary | ICD-10-CM

## 2011-11-16 DIAGNOSIS — K56609 Unspecified intestinal obstruction, unspecified as to partial versus complete obstruction: Secondary | ICD-10-CM

## 2011-11-16 DIAGNOSIS — L03317 Cellulitis of buttock: Secondary | ICD-10-CM

## 2011-11-16 DIAGNOSIS — L0231 Cutaneous abscess of buttock: Secondary | ICD-10-CM

## 2011-11-16 LAB — BASIC METABOLIC PANEL
Chloride: 106 mEq/L (ref 96–112)
Creatinine, Ser: 1.63 mg/dL — ABNORMAL HIGH (ref 0.50–1.10)
GFR calc Af Amer: 51 mL/min — ABNORMAL LOW (ref >90)
Potassium: 4 mEq/L (ref 3.5–5.1)
Sodium: 137 mEq/L (ref 135–145)

## 2011-11-16 LAB — CBC
HCT: 26.9 % — ABNORMAL LOW (ref 36.0–46.0)
Hemoglobin: 8.3 g/dL — ABNORMAL LOW (ref 12.0–15.0)
RBC: 3.31 MIL/uL — ABNORMAL LOW (ref 3.87–5.11)
RDW: 15.5 % (ref 11.5–15.5)
WBC: 10.6 10*3/uL — ABNORMAL HIGH (ref 4.0–10.5)

## 2011-11-16 LAB — CLOSTRIDIUM DIFFICILE BY PCR: Toxigenic C. Difficile by PCR: NEGATIVE

## 2011-11-16 MED ORDER — OXYCODONE-ACETAMINOPHEN 5-325 MG PO TABS: 1.0000 | ORAL_TABLET | Freq: Four times a day (QID) | ORAL | Status: AC | PRN

## 2011-11-16 MED ORDER — METRONIDAZOLE 500 MG PO TABS: 500.0000 mg | ORAL_TABLET | Freq: Three times a day (TID) | ORAL | Status: AC

## 2011-11-16 MED ORDER — CIPROFLOXACIN HCL 250 MG PO TABS: 250.0000 mg | ORAL_TABLET | Freq: Two times a day (BID) | ORAL | Status: AC

## 2011-11-16 MED ORDER — PROMETHAZINE HCL 25 MG PO TABS: 25.0000 mg | ORAL_TABLET | ORAL | Status: DC | PRN

## 2011-11-16 NOTE — Progress Notes (Signed)
4 Days Post-Op  Subjective: Feeling better overall.  Objective: Vital signs in last 24 hours: Temp:  [98.4 F (36.9 C)-100.1 F (37.8 C)] 100.1 F (37.8 C) (05/05 0500) Pulse Rate:  [79-88] 88  (05/05 0500) Resp:  [16-18] 18  (05/05 0500) BP: (144-149)/(82-95) 149/89 mmHg (05/05 0500) SpO2:  [96 %-97 %] 96 % (05/05 0500) Last BM Date: 11/15/11  Intake/Output from previous day: 05/04 0701 - 05/05 0700 In: 1235 [I.V.:1235] Out: -  Intake/Output this shift:    PE: Anorectal-wound clean with minimal induration.  Lab Results:   Basename 11/16/11 0455 11/15/11 0437  WBC 10.6* 12.6*  HGB 8.3* 8.3*  HCT 26.9* 26.6*  PLT 440* 420*   BMET  Basename 11/16/11 0455 11/15/11 0437  NA 137 135  K 4.0 3.8  CL 106 103  CO2 23 20  GLUCOSE 88 88  BUN 6 6  CREATININE 1.63* 1.70*  CALCIUM 8.6 8.3*   PT/INR No results found for this basename: LABPROT:2,INR:2 in the last 72 hours Comprehensive Metabolic Panel:    Component Value Date/Time   NA 137 11/16/2011 0455   K 4.0 11/16/2011 0455   CL 106 11/16/2011 0455   CO2 23 11/16/2011 0455   BUN 6 11/16/2011 0455   CREATININE 1.63* 11/16/2011 0455   GLUCOSE 88 11/16/2011 0455   CALCIUM 8.6 11/16/2011 0455   AST 14 11/08/2011 0725   ALT 10 11/08/2011 0725   ALKPHOS 55 11/08/2011 0725   BILITOT 0.4 11/08/2011 0725   PROT 7.9 11/08/2011 0725   ALBUMIN 3.4* 11/08/2011 0725     Studies/Results: Mr Pelvis Wo Contrast  11/15/2011  *RADIOLOGY REPORT*  Clinical Data: Evaluate perianal fistula.  MRI PELVIS WITHOUT CONTRAST  Technique:  Multi-planar multi-sequence MR imaging of the pelvis was performed following the standard protocol. No intravenous contrast was administered.  Comparison: CT scan 11/08/2011.  Findings: There is an inflammatory process in the left ischiorectal fossa which appears improved when compared the prior CT scan.  I cannot identify a definite perianal fistula although it is still possibility.  The internal and external sphincters  appear intact.  There is a septated cystic lesion in the pelvis which arises from the right ovary.  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  The left ovary appears normal.  The uterus and cervix are unremarkable.  The rectum appears normal.  IMPRESSION:  1.  The abscess in the left ischiorectal fossa region has improved since the prior CT scan.  No obvious perianal fistula. 2.  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  Original Report Authenticated By: P. Kalman Jewels, M.D.   US Renal  11/14/2011  *RADIOLOGY REPORT*  Clinical Data:  Elevated creatinine and fever.  RENAL/URINARY TRACT ULTRASOUND COMPLETE  Comparison:  11/08/2011  Findings:  Right Kidney:  Normal in size and parenchymal echogenicity.  No evidence of mass or hydronephrosis.  Left Kidney:  Normal in size and parenchymal echogenicity.  No evidence of mass or hydronephrosis.  Bladder:  Appears normal for degree of bladder distention.  Other:  Free fluid noted within the pelvis.  The patient is also seen to have a cyst on the right ovary.  IMPRESSION:  1.  No evidence for obstructive uropathy. 2.  Nonspecific free fluid within the pelvis.  Original Report Authenticated By: Angelita Ingles, M.D.    Anti-infectives: Anti-infectives     Start     Dose/Rate Route Frequency Ordered Stop   11/13/11  1400   metroNIDAZOLE (FLAGYL) tablet 500 mg        500 mg Oral 3 times per day 11/13/11 1000     11/13/11 1100   ciprofloxacin (CIPRO) tablet 250 mg        250 mg Oral 2 times daily 11/13/11 1000     11/10/11 1000   piperacillin-tazobactam (ZOSYN) IVPB 3.375 g  Status:  Discontinued        3.375 g 12.5 mL/hr over 240 Minutes Intravenous Every 8 hours 11/10/11 0853 11/13/11 1000   11/09/11 2200   vancomycin (VANCOCIN) 1,250 mg in sodium chloride 0.9 % 250 mL IVPB  Status:  Discontinued        1,250 mg 166.7 mL/hr over 90 Minutes Intravenous Every 8 hours  11/09/11 1433 11/11/11 1651   11/08/11 1400   vancomycin (VANCOCIN) IVPB 1000 mg/200 mL premix  Status:  Discontinued        1,000 mg 200 mL/hr over 60 Minutes Intravenous Every 8 hours 11/08/11 1352 11/09/11 1433          Assessment Principal Problem:  *SBO (small bowel obstruction)-resolved Active Problems:  Anorectal abscess-drained; MRI results noted.  CROHN'S DISEASE      LOS: 8 days   Plan: Continue wound care.  Follow up with Dr. Katina Degree as needed.   Reynolds Kittel J 11/16/2011

## 2011-11-16 NOTE — Discharge Summary (Signed)
Discharge Summary  [name] MR#: [id number]  DOB:05-15-1991  Date of Admission: 11/08/2011 Date of Discharge: 11/16/2011  Patient's PCP: Gwendolyn Grant, MD, MD  Attending Physician:Diem Pagnotta A  Consults: Medical Consultants:  Dr. Erskine Emery and Dr. [name], Gastroenterology  Dr. Fanny Skates, Dr. Donne Hazel, Dr. Zella Richer, Surgery  Discharge Diagnoses: Principal Problem:  *SBO (small bowel obstruction) Active Problems:  ANEMIA, IRON DEFICIENCY, CHRONIC  CROHN'S DISEASE  Hypokalemia  Anxiety  Cellulitis Fever Acute Renal failure  Brief Admitting History and Physical [name] is an 21 y.o. female with with PMH of MRSA abscesses, Crohn's disease and multiple abdominal surgeries who presented to the hospital with a 2 day history of abdominal pain that has progressed over time on 11/08/2011.  Discharge Medications Medication List  As of 11/16/2011 12:06 PM   TAKE these medications         adalimumab 40 MG/0.8ML injection   Commonly known as: HUMIRA   Inject 0.8 mLs (40 mg total) into the skin every 14 (fourteen) days. PLEASE GIVE PENS!!!      albuterol 108 (90 BASE) MCG/ACT inhaler   Commonly known as: PROVENTIL HFA;VENTOLIN HFA   Inhale 2 puffs into the lungs every 6 (six) hours as needed for wheezing.      ciprofloxacin 250 MG tablet   Commonly known as: CIPRO   Take 1 tablet (250 mg total) by mouth 2 (two) times daily.      esomeprazole 40 MG capsule   Commonly known as: NEXIUM   Take 1 capsule (40 mg total) by mouth daily before breakfast.      hydrocortisone 25 MG suppository   Commonly known as: ANUSOL-HC   Place 1 suppository (25 mg total) rectally every 12 (twelve) hours.      LORazepam 1 MG tablet   Commonly known as: ATIVAN   Take 1 tablet (1 mg total) by mouth 3 (three) times daily.      mercaptopurine 50 MG tablet   Commonly known as: PURINETHOL   Take 1 tablet (50 mg total) by mouth daily.      metroNIDAZOLE 500 MG  tablet   Commonly known as: FLAGYL   Take 1 tablet (500 mg total) by mouth every 8 (eight) hours.      oxyCODONE-acetaminophen 5-325 MG per tablet   Commonly known as: PERCOCET   Take 1 tablet by mouth every 6 (six) hours as needed.      promethazine 25 MG tablet   Commonly known as: PHENERGAN   Take 1 tablet (25 mg total) by mouth every 4 (four) hours as needed for nausea. Take 1 tablet by mouth every 4-6 hours as needed for nausea      traMADol 50 MG tablet   Commonly known as: ULTRAM   Take 1 or 2 tabs every 6 hours prn pain            Hospital Course: Antibiotics:  Vancomycin 11/08/11---> 11/11/2011  Zosyn 11/10/11--->11/13/2011  Ciprofloxacin 11/13/2011 ---->  Metronidazole 11/13/2011 ---->  SBO (small bowel obstruction)  Resolved.  Confirmed by CT scan on 11/08/11. Seen by Dr. Dalbert Batman on 11/08/11 who recommended continuing bowel rest, and NG tube suction. NG tube dislodged 11/09/11, was left out given she was not having any nausea or vomiting. Abdominal films done 11/09/11: Stable; Repeated 11/10/11: Showed SBO resolution.  GI consultation performed by Dr. Deatra Ina on 11/09/11: SBO felt to be mechanical, initially held 6-MP which was restarted once patient able to tolerate oral intake.  Diet advanced to clears  11/10/11 and 6-MP resumed.  EGD 11/11/11. Normal. No evidence of active Crohn's disease.  Advanced to solid diet on 11/12/2011.  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  No evidence of abscess on CT.  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  Nasal swab for PCR MRSA negative.  Zosyn added 11/10/11 due to recurrent induration/pain to cover fistulizing gram negative flora, Zosyn transitioned to Cipro and metronidazole on 11/13/2011, defined for 10 days from discharge.  Patient was taken to surgery 11/12/2011 for incision and drainage of perirectal abscess. Patient also had rigid proctoscopy.  Patient reports per  surgery was instructed for wound clean, does not require any packing, as a result did not arrange for home health nurse for wound care.  Continue wound care at home.  Acute renal failure  Etiology unclear, improved. Renal ultrasound on 11/14/2011 showed no evidence for obstructive uropathy.  Continue to monitor. Patient may have had contrast-induced nephropathy which is resolving.  Fever  Etiology unclear. Patient is immunocompromised. Blood cultures x2 drawn on 11/14/2011, show no growth to date. Syncope are negative on 11/15/2011. 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  [place] resume on discharge, 6-MP resumed.  Seen by Dr. Deatra Ina on 11/09/11, Dr. [name] now following.  Hypokalemia  Replaced in IVF. Resolved.  Anxiety  Continue when necessary Ativan.  Chronic iron deficiency anemia  Known history of menorrhagia.  Hemoglobin stable.  Further management as outpatient. Has a history of allergy to iron and iron dextran.  Day of Discharge BP 149/89  Pulse 88  Temp(Src) 100.1 F (37.8 C) (Oral)  Resp 18  Ht 5' 4"  (1.626 m)  Wt 98.884 kg (218 lb)  BMI 37.42 kg/m2  SpO2 96%  LMP 10/24/2011  Results for orders placed during the hospital encounter of 11/08/11 (from the past 48 hour(s))  CBC     Status: Abnormal   Collection Time   11/15/11  4:37 AM      Component Value Range Comment   WBC 12.6 (*) 4.0 - 10.5 (K/uL)    RBC 3.28 (*) 3.87 - 5.11 (MIL/uL)    Hemoglobin 8.3 (*) 12.0 - 15.0 (g/dL)    HCT 26.6 (*) 36.0 - 46.0 (%)    MCV 81.1  78.0 - 100.0 (fL)    MCH 25.3 (*) 26.0  - 34.0 (pg)    MCHC 31.2  30.0 - 36.0 (g/dL)    RDW 15.1  11.5 - 15.5 (%)    Platelets 420 (*) 150 - 400 (K/uL)   BASIC METABOLIC PANEL     Status: Abnormal   Collection Time   11/15/11  4:37 AM      Component Value Range Comment   Sodium 135  135 - 145 (mEq/L)    Potassium 3.8  3.5 - 5.1 (mEq/L)    Chloride 103  96 - 112 (mEq/L)    CO2 20  19 - 32 (mEq/L)    Glucose, Bld 88  70 - 99 (mg/dL)    BUN 6  6 - 23 (mg/dL)    Creatinine, Ser 1.70 (*) 0.50 - 1.10 (mg/dL)  Calcium 8.3 (*) 8.4 - 10.5 (mg/dL)    GFR calc non Af Amer 42 (*) >90 (mL/min)    GFR calc Af Amer 48 (*) >90 (mL/min)   CLOSTRIDIUM DIFFICILE BY PCR     Status: Normal   Collection Time   11/15/11  5:50 PM      Component Value Range Comment   C difficile by pcr NEGATIVE  NEGATIVE    BASIC METABOLIC PANEL     Status: Abnormal   Collection Time   11/16/11  4:55 AM      Component Value Range Comment   Sodium 137  135 - 145 (mEq/L)    Potassium 4.0  3.5 - 5.1 (mEq/L)    Chloride 106  96 - 112 (mEq/L)    CO2 23  19 - 32 (mEq/L)    Glucose, Bld 88  70 - 99 (mg/dL)    BUN 6  6 - 23 (mg/dL)    Creatinine, Ser 1.63 (*) 0.50 - 1.10 (mg/dL)    Calcium 8.6  8.4 - 10.5 (mg/dL)    GFR calc non Af Amer 44 (*) >90 (mL/min)    GFR calc Af Amer 51 (*) >90 (mL/min)   CBC     Status: Abnormal   Collection Time   11/16/11  4:55 AM      Component Value Range Comment   WBC 10.6 (*) 4.0 - 10.5 (K/uL)    RBC 3.31 (*) 3.87 - 5.11 (MIL/uL)    Hemoglobin 8.3 (*) 12.0 - 15.0 (g/dL)    HCT 26.9 (*) 36.0 - 46.0 (%)    MCV 81.3  78.0 - 100.0 (fL)    MCH 25.1 (*) 26.0 - 34.0 (pg)    MCHC 30.9  30.0 - 36.0 (g/dL)    RDW 15.5  11.5 - 15.5 (%)    Platelets 440 (*) 150 - 400 (K/uL)     Mr Pelvis Wo Contrast  11/15/2011  *RADIOLOGY REPORT*  Clinical Data: Evaluate perianal fistula.  MRI PELVIS WITHOUT CONTRAST  Technique:  Multi-planar multi-sequence MR imaging of the pelvis was performed following the standard protocol. No intravenous contrast  was administered.  Comparison: CT scan 11/08/2011.  Findings: There is an inflammatory process in the left ischiorectal fossa which appears improved when compared the prior CT scan.  I cannot identify a definite perianal fistula although it is still possibility.  The internal and external sphincters appear intact.  There is a septated cystic lesion in the pelvis which arises from the right ovary.  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  The left ovary appears normal.  The uterus and cervix are unremarkable.  The rectum appears normal.  IMPRESSION:  1.  The abscess in the left ischiorectal fossa region has improved since the prior CT scan.  No obvious perianal fistula. 2.  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  Original Report Authenticated By: P. Kalman Jewels, M.D.   Ct Abdomen Pelvis W Contrast  11/08/2011  *RADIOLOGY REPORT*  Clinical Data: Abdominal pain.  Vomiting.  Rectal boil.  Previous MRSA infections.  History of Crohn's disease with enterocutaneous fistula.  Previous left ovarian cyst, small bowel resection, fistula repair, cholecystectomy and appendectomy.  Leukocytosis.  CT ABDOMEN AND PELVIS WITH CONTRAST  Technique:  Multidetector CT imaging of the abdomen and pelvis was performed following the standard protocol during bolus administration of intravenous contrast.  Contrast: 15m OMNIPAQUE IOHEXOL 300 MG/ML  SOLN  Comparison: Previous  examinations, including the pelvic ultrasound dated 02/05/2011 and abdomen and pelvis CT dated 02/05/2011.  Findings: Cholecystectomy clips.  Anterior midline surgical scar with stable underlying high density material, possibly representing suture material.  Multiple mildly to moderately dilated proximal and mid small bowel loops with normal caliber distal small bowel.  Small bowel anastomosis in the upper left abdomen. The dilated small bowel extends to the level of the in  anastomosis on the left and is normal caliber distal to the anastomosis.  There is fecal like material in the dilated small bowel at and proximal to the anastomosis.  The distal small bowel is normal in caliber to the level of an anastomosis to the colon in the right upper pelvis.  Small to moderate amount of free peritoneal fluid in the pelvic cul- de-sac containing probable ovaries with small cysts.  Abnormal soft tissue density in the left posterior perianal region, measuring 7.3 x 4.0 cm in maximum dimensions on image number 97. No visible fluid collection.  Stable increased soft tissue density in the left anterior abdominal wall at the junction of the abdomen and pelvis, extending into the subcutaneous fat, compatible with a previous ostomy site.  Interval medium soft tissue density in the underlying peritoneal fat, partially surrounding the dilated small bowel just proximal to the level of the obstruction.  Mild intrahepatic and proximal extrahepatic biliary ductal dilatation.  Otherwise, normal appearing liver, spleen, pancreas, adrenal glands, kidneys, urinary bladder and uterus.  No enlarged lymph nodes.  Small amount of linear scarring at the right lung base.  Minimal lower thoracic spine degenerative changes.  IMPRESSION:  1. Small bowel obstruction to the level of a small bowel anastomosis in the left upper pelvis.  2.  Interval scar tissue in the anterior peritoneal fat beneath a previous ostomy scar. 3.  Mildly progressive post cholecystectomy biliary ductal dilatation.  This is not felt to represent obstruction, based on a normal total bilirubin today. 4.  Left perianal soft tissue infection without abscess.  Original Report Authenticated By: Gerald Stabs, M.D.   US Renal  11/14/2011  *RADIOLOGY REPORT*  Clinical Data:  Elevated creatinine and fever.  RENAL/URINARY TRACT ULTRASOUND COMPLETE  Comparison:  11/08/2011  Findings:  Right Kidney:  Normal in size and parenchymal echogenicity.  No  evidence of mass or hydronephrosis.  Left Kidney:  Normal in size and parenchymal echogenicity.  No evidence of mass or hydronephrosis.  Bladder:  Appears normal for degree of bladder distention.  Other:  Free fluid noted within the pelvis.  The patient is also seen to have a cyst on the right ovary.  IMPRESSION:  1.  No evidence for obstructive uropathy. 2.  Nonspecific free fluid within the pelvis.  Original Report Authenticated By: Angelita Ingles, M.D.   Dg Abd 2 Views  11/10/2011  *RADIOLOGY REPORT*  Clinical Data: Small bowel obstruction with Crohn's disease.  ABDOMEN - 2 VIEW  Comparison: 11/09/2011 and CT of 11/08/2011.  Findings: Upright and supine views.  Upright view demonstrates no free intraperitoneal air.  Resolution of previously described small bowel air fluid levels.  Removal of nasogastric tube.  Right upper quadrant surgical clips.  Supine views demonstrate resolution of small bowel dilatation.  No pneumatosis.  Minimal distal gas.  Phleboliths in the pelvis.  IMPRESSION: Resolution of small bowel obstruction.  Removal of nasogastric tube.  Original Report Authenticated By: Areta Haber, M.D.   Dg Abd 2 Views  11/09/2011  *RADIOLOGY REPORT*  Clinical Data: Follow-up small bowel  obstruction.  History of Crohn's disease and bowel resection.  ABDOMEN - 2 VIEW  Comparison: CT obtained yesterday.  Findings: No significant change in dilated small bowel loops without free peritoneal air.  Interval passage of the oral contrast into normal-caliber colon.  Nasogastric tube tip in the proximal stomach and side hole in the distal esophagus.  Cholecystectomy clips.  Minimal scoliosis.  IMPRESSION:  1.  Stable partial small bowel obstruction. 2.  Nasogastric tube tip in the proximal stomach and side hole in the distal esophagus.  Original Report Authenticated By: Gerald Stabs, M.D.   Disposition: Home  Diet: Heart healthy diet  Activity: Resume as tolerated   Follow-up Appts: Discharge  Orders    Future Appointments: Provider: Department: Dept Phone: Center:   12/10/2011 8:15 AM [place] Dragon, MD [facility] [phone] [facility]     Future Orders Please Complete By Expires   Diet - low sodium heart healthy      Increase activity slowly      Discharge instructions      Comments:   Followup with Gwendolyn Grant, MD (PCP) in 2 weeks. Followup with Dr. [name] (GI) in 1 month. Please follow up with your gynecologist [name], PA in 1 month.      TESTS THAT NEED FOLLOW-UP None  Time spent on discharge, talking to the patient, and coordinating care: 35 mins.   Signed: Bynum Bellows, MD 11/16/2011, 12:06 PM

## 2011-11-16 NOTE — Progress Notes (Signed)
[facility] Progress Note  SUBJECTIVE: feels okay. No diarrhea, no nausea.  OBJECTIVE:  Vital signs in last 24 hours: Temp:  [98.4 F (36.9 C)-100.1 F (37.8 C)] 100.1 F (37.8 C) (05/05 0500) Pulse Rate:  [79-88] 88  (05/05 0500) Resp:  [16-18] 18  (05/05 0500) BP: (144-149)/(82-95) 149/89 mmHg (05/05 0500) SpO2:  [96 %-97 %] 96 % (05/05 0500) Last BM Date: 11/15/11 General:    Pleasant black female in NAD Abdomen:  Soft, nontender and nondistended. Normal bowel sounds. Neurologic:  Alert and oriented,  grossly normal neurologically. Psych:  Cooperative. Normal mood and affect.   Lab Results:  Basename 11/16/11 0455 11/15/11 0437 11/14/11 0439  WBC 10.6* 12.6* 11.9*  HGB 8.3* 8.3* 8.9*  HCT 26.9* 26.6* 28.4*  PLT 440* 420* 470*   BMET  Basename 11/16/11 0455 11/15/11 0437 11/14/11 0439  NA 137 135 135  K 4.0 3.8 4.5  CL 106 103 103  CO2 23 20 22   GLUCOSE 88 88 88  BUN 6 6 6   CREATININE 1.63* 1.70* 1.82*  CALCIUM 8.6 8.3* 8.6    Studies/Results: Mr Pelvis Wo Contrast  11/15/2011  *RADIOLOGY REPORT*  Clinical Data: Evaluate perianal fistula.  MRI PELVIS WITHOUT CONTRAST  Technique:  Multi-planar multi-sequence MR imaging of the pelvis was performed following the standard protocol. No intravenous contrast was administered.  Comparison: CT scan 11/08/2011.  Findings: There is an inflammatory process in the left ischiorectal fossa which appears improved when compared the prior CT scan.  I cannot identify a definite perianal fistula although it is still possibility.  The internal and external sphincters appear intact.  There is a septated cystic lesion in the pelvis which arises from the right ovary.  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  The left ovary appears normal.  The uterus and cervix are unremarkable.  The rectum appears normal.  IMPRESSION:  1.  The abscess in the left ischiorectal fossa region has improved since the prior CT scan.   No obvious perianal fistula. 2.  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  Original Report Authenticated By: P. Kalman Jewels, M.D.     ASSESSMENT / PLAN:  1. Crohn's disease, s/p I&D of perirectal abscess this admission. Abscess resolving per surgery's note. Pelvic MRI yesterday shows that the abscess in the left ischiorectal fossa region has improved . No obvious perianal fistula was seen. Wound looks okay this am per surgery's note. Patient okay to discharge from GI standpoint. She should continue Cipro and Flagyl for 10 more days. She will need some pain meds and Phenergan at home. We will call her with a follow up appointment.   2. Fever and mild leukocytosis, both improving. Tmax 100.1. Blood cultures negative at day #2. Cause of fever / elevated WBC not apparent. Continue Cipro and Flagyl. Await C-Diff but not likely she has it as diarrhea has ceased. Will call her if positive.    3. Acute renal failure, improving. Creatinine down to 1.63 today. Renal ultrasound negative.   4. SBO, resolved.   5. Abnormal pelvic MRI. 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     LOS: 8 days   [name]  11/16/2011, 9:18 AM

## 2011-11-16 NOTE — Progress Notes (Signed)
Assessment unchanged. Pt verbalized understanding of dc instructions. Scripts x4 given to pt as provided by MD.  Discharged via wc to front entrance accompanied by family and nurse tech to meet awaiting vehicle to carry home.

## 2011-11-16 NOTE — Progress Notes (Signed)
Subjective: No specific concerns.  Wondering if she can be discharged.  Feeling better overall.  Objective: Vital signs in last 24 hours: Filed Vitals:   11/15/11 0500 11/15/11 1734 11/15/11 2215 11/16/11 0500  BP: 144/62 148/82 144/95 149/89  Pulse: 103 80 79 88  Temp: 99.9 F (37.7 C) 98.4 F (36.9 C) 98.6 F (37 C) 100.1 F (37.8 C)  TempSrc: Oral Oral Oral Oral  Resp: 18 16 18 18   Height:      Weight:      SpO2: 100%  97% 96%   Weight change:   Intake/Output Summary (Last 24 hours) at 11/16/11 1156 Last data filed at 11/16/11 9326  Gross per 24 hour  Intake   1235 ml  Output      0 ml  Net   1235 ml    Physical Exam: General: Awake, Oriented, No acute distress. HEENT: EOMI. Neck: Supple CV: S1 and S2 Lungs: Clear to ascultation bilaterally Abdomen: Soft, nontender, Nondistended, +bowel sounds. Ext: Good pulses. Trace edema.  Lab Results:  Basename 11/16/11 0455 11/15/11 0437  NA 137 135  K 4.0 3.8  CL 106 103  CO2 23 20  GLUCOSE 88 88  BUN 6 6  CREATININE 1.63* 1.70*  CALCIUM 8.6 8.3*  MG -- --  PHOS -- --   No results found for this basename: AST:2,ALT:2,ALKPHOS:2,BILITOT:2,PROT:2,ALBUMIN:2 in the last 72 hours No results found for this basename: LIPASE:2,AMYLASE:2 in the last 72 hours  Basename 11/16/11 0455 11/15/11 0437  WBC 10.6* 12.6*  NEUTROABS -- --  HGB 8.3* 8.3*  HCT 26.9* 26.6*  MCV 81.3 81.1  PLT 440* 420*   No results found for this basename: CKTOTAL:3,CKMB:3,CKMBINDEX:3,TROPONINI:3 in the last 72 hours No components found with this basename: POCBNP:3 No results found for this basename: DDIMER:2 in the last 72 hours No results found for this basename: HGBA1C:2 in the last 72 hours No results found for this basename: CHOL:2,HDL:2,LDLCALC:2,TRIG:2,CHOLHDL:2,LDLDIRECT:2 in the last 72 hours No results found for this basename: TSH,T4TOTAL,FREET3,T3FREE,THYROIDAB in the last 72 hours No results found for this basename:  VITAMINB12:2,FOLATE:2,FERRITIN:2,TIBC:2,IRON:2,RETICCTPCT:2 in the last 72 hours  Micro Results: Recent Results (from the past 240 hour(s))  URINE CULTURE     Status: Normal   Collection Time   11/08/11  8:43 AM      Component Value Range Status Comment   Specimen Description URINE, CLEAN CATCH   Final    Special Requests NONE   Final    Culture  Setup Time [id number]   Final    Colony Count 50,000 COLONIES/ML   Final    Culture     Final    Value: Multiple bacterial morphotypes present, none predominant. Suggest appropriate recollection if clinically indicated.   Report Status 11/09/2011 FINAL   Final   MRSA PCR SCREENING     Status: Normal   Collection Time   11/08/11  2:28 PM      Component Value Range Status Comment   MRSA by PCR NEGATIVE  NEGATIVE  Final   CULTURE, BLOOD (ROUTINE X 2)     Status: Normal (Preliminary result)   Collection Time   11/14/11  4:39 AM      Component Value Range Status Comment   Specimen Description BLOOD LEFT ARM   Final    Special Requests BOTTLES DRAWN AEROBIC AND ANAEROBIC [facility]   Final    Culture  Setup Time [id number]   Final    Culture     Final    Value:  BLOOD CULTURE RECEIVED NO GROWTH TO DATE CULTURE WILL BE HELD FOR 5 DAYS BEFORE ISSUING A FINAL NEGATIVE REPORT   Report Status PENDING   Incomplete   CULTURE, BLOOD (ROUTINE X 2)     Status: Normal (Preliminary result)   Collection Time   11/14/11  4:43 AM      Component Value Range Status Comment   Specimen Description BLOOD LEFT HAND   Final    Special Requests BOTTLES DRAWN AEROBIC AND ANAEROBIC 5CC   Final    Culture  Setup Time [id number]   Final    Culture     Final    Value:        BLOOD CULTURE RECEIVED NO GROWTH TO DATE CULTURE WILL BE HELD FOR 5 DAYS BEFORE ISSUING A FINAL NEGATIVE REPORT   Report Status PENDING   Incomplete   CLOSTRIDIUM DIFFICILE BY PCR     Status: Normal   Collection Time   11/15/11  5:50 PM      Component Value Range Status Comment   C difficile by  pcr NEGATIVE  NEGATIVE  Final     Studies/Results: Mr Pelvis Wo Contrast  11/15/2011  *RADIOLOGY REPORT*  Clinical Data: Evaluate perianal fistula.  MRI PELVIS WITHOUT CONTRAST  Technique:  Multi-planar multi-sequence MR imaging of the pelvis was performed following the standard protocol. No intravenous contrast was administered.  Comparison: CT scan 11/08/2011.  Findings: There is an inflammatory process in the left ischiorectal fossa which appears improved when compared the prior CT scan.  I cannot identify a definite perianal fistula although it is still possibility.  The internal and external sphincters appear intact.  There is a septated cystic lesion in the pelvis which arises from the right ovary.  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  The left ovary appears normal.  The uterus and cervix are unremarkable.  The rectum appears normal.  IMPRESSION:  1.  The abscess in the left ischiorectal fossa region has improved since the prior CT scan.  No obvious perianal fistula. 2.  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  Original Report Authenticated By: P. Kalman Jewels, M.D.    Medications: I have reviewed the patient's current medications. Scheduled Meds:    . ciprofloxacin  250 mg Oral BID  . mercaptopurine  50 mg Oral Daily  . metroNIDAZOLE  500 mg Oral Q8H  . pantoprazole  40 mg Oral BID AC   Continuous Infusions:    . sodium chloride 50 mL/hr at 11/16/11 0532   PRN Meds:.acetaminophen, diphenhydrAMINE, HYDROmorphone (DILAUDID) injection, LORazepam, ondansetron (ZOFRAN) IV, ondansetron, oxyCODONE-acetaminophen  Assessment/Plan: Antibiotics:  Vancomycin 11/08/11---> 11/11/2011 Zosyn 11/10/11--->11/13/2011 Ciprofloxacin 11/13/2011 ----> Metronidazole 11/13/2011 ---->  Medical Consultants:  Dr. Erskine Emery and Dr. [name], Gastroenterology  Dr. Fanny Skates, Dr. Donne Hazel, Dr. Zella Richer,  Surgery  SBO (small bowel obstruction)  Resolved. Confirmed by CT scan on 11/08/11. Seen by Dr. Dalbert Batman on 11/08/11 who recommended continuing bowel rest, and NG tube suction. NG tube dislodged 11/09/11, was left out given she was not having any nausea or vomiting.  Abdominal films done 11/09/11: Stable; Repeated 11/10/11: Showed SBO resolution.  GI consultation performed by Dr. Deatra Ina on 11/09/11: SBO felt to be mechanical, initially held 6-MP which was restarted once patient able to tolerate oral intake.   Diet advanced to clears 11/10/11 and 6-MP resumed.  EGD 11/11/11. Normal. No evidence of active Crohn's disease.  Advanced to solid diet on 11/12/2011.  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  No evidence of abscess on CT.  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  Nasal swab for PCR MRSA negative.  Zosyn added 11/10/11 due to recurrent induration/pain to cover fistulizing gram negative flora, Zosyn transitioned to Cipro and metronidazole on 11/13/2011, defined for 7 days. Patient was taken to surgery 11/12/2011 for incision and drainage of perirectal abscess.  Patient also had rigid proctoscopy.  Continue wound care. May need home health RN for dressing changes prior to discharge. Patient to continue antibiotics for 10 more days as per GI.  Acute renal failure  Etiology unclear, improved.  Renal ultrasound on 11/14/2011 showed no evidence for obstructive uropathy. Continue to monitor.  Patient may have had contrast-induced nephropathy which is resolving.  Fever  Etiology unclear.  Patient is immunocompromised.  Blood cultures x2 drawn on 11/14/2011, show no growth to date.  Syncope are negative on 11/15/2011.  MRI of the pelvis on 11/15/2011 showed abscess in the left issue rectal fossa region has improved as seen on prior CT, no obvious perianal fistula, also noted septated cystic lesion associated with right ovary likely ovarian  cystadenoma.  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  Travis resume on discharge, 6-MP resumed.  Seen by Dr. Deatra Ina on 11/09/11, Dr. [name] now following.  Hypokalemia  Replaced in IVF. Resolved.  Anxiety  Continue when necessary Ativan.  Chronic iron deficiency anemia  Known history of menorrhagia.  Hemoglobin stable.  Code Status: Full  Family Communication: Family by bedside.  Disposition Plan: Discharge patient today.   LOS: 8 days  Aviana Shevlin A, MD 11/16/2011, 11:56 AM

## 2011-11-16 NOTE — Progress Notes (Signed)
I have reviewed the above note, examined the patient and agree with plan of treatment.She has an appointment with me  This month. She will stay on antibiotics x 10 days.Resume Humira and 6MP

## 2011-11-18 ENCOUNTER — Telehealth: Payer: Self-pay | Admitting: Internal Medicine

## 2011-11-18 NOTE — Telephone Encounter (Signed)
Patient wants to know if she needs to keep the [name] on 12/10/11 at 8:15 AM since she was just in the hospital. She does not want to miss work. Also, wants to know if we have received the short term disability form from her work. Per Carla Drape, form is at [facility] to be completed. Patient aware. Please, advise if patient needs to keep OV.

## 2011-11-18 NOTE — Telephone Encounter (Signed)
She does not have to keep her appointment if she if feeling OK  But I don't want her to be calling the following day with a problem. That's why she has the appointment

## 2011-11-19 ENCOUNTER — Other Ambulatory Visit: Payer: Self-pay | Admitting: Internal Medicine

## 2011-11-19 MED ORDER — ESOMEPRAZOLE MAGNESIUM 40 MG PO CPDR: 40.0000 mg | DELAYED_RELEASE_CAPSULE | Freq: Every day | ORAL | Status: DC

## 2011-11-19 NOTE — Telephone Encounter (Signed)
Spoke with patient and she will keep appointment for now.

## 2011-11-19 NOTE — Telephone Encounter (Signed)
rx sent

## 2011-11-19 NOTE — Telephone Encounter (Signed)
Left a message for patient to call me. 

## 2011-11-20 LAB — CULTURE, BLOOD (ROUTINE X 2)
Culture  Setup Time: 201305030818
Culture: NO GROWTH

## 2011-11-21 ENCOUNTER — Encounter: Payer: Self-pay | Admitting: *Deleted

## 2011-11-21 ENCOUNTER — Telehealth: Payer: Self-pay | Admitting: Internal Medicine

## 2011-11-21 NOTE — Telephone Encounter (Signed)
Pt states she was placed on Cipro while she was in the hospital and she thought she was only supposed to take it for 7 days. Today is the 7th day but she states there were 30 tablets in the bottle. Please advise how long the pt should take the cipro.

## 2011-11-21 NOTE — Telephone Encounter (Signed)
It was only for 7 days. DB.

## 2011-11-24 NOTE — Telephone Encounter (Signed)
Everything was for 7 days, the extra pill are in case her fever came back.

## 2011-11-24 NOTE — Telephone Encounter (Signed)
Spoke with patient and gave her Dr. Nichola Sizer recommendations.

## 2011-11-24 NOTE — Telephone Encounter (Signed)
Spoke with patient and gave her Dr. Nichola Sizer recommendation. She states she also has Flagyl and wants to know if this was for 7 days also. Please, advise

## 2011-11-28 ENCOUNTER — Telehealth: Payer: Self-pay | Admitting: Internal Medicine

## 2011-11-28 NOTE — Telephone Encounter (Signed)
Left a message for patient to call me. 

## 2011-11-28 NOTE — Telephone Encounter (Signed)
Spoke with patient's mother and told her Dr. [name] is not here on 12/12/11. She will tell patient.

## 2011-11-28 NOTE — Telephone Encounter (Signed)
Patient states she was suppose to get an rx for yeast when she was d/c'ed from hospital. She did not get it and wants to know if Dr. [name] will write for it now. R/S her OV to 12/05/11 at 1:30 with Nicoletta Ba, PA (Dr. [name] supervising)

## 2011-11-30 ENCOUNTER — Telehealth: Payer: Self-pay | Admitting: Internal Medicine

## 2011-11-30 NOTE — Telephone Encounter (Signed)
I have discussed her condition with the pt, she continues to have a rectal drainage from the fistula. I have called in Augmentin 875 mg, #10, 1 po qd and Diflucan 100 mg, #5, 1 po qd to [facility] at [facility].

## 2011-11-30 NOTE — Telephone Encounter (Signed)
I left message on her mobile and home phone.

## 2011-12-02 ENCOUNTER — Telehealth: Payer: Self-pay | Admitting: Internal Medicine

## 2011-12-02 MED ORDER — LORAZEPAM 1 MG PO TABS: 1.0000 mg | ORAL_TABLET | Freq: Three times a day (TID) | ORAL | Status: DC

## 2011-12-02 MED ORDER — PROMETHAZINE HCL 25 MG PO TABS: 25.0000 mg | ORAL_TABLET | ORAL | Status: DC | PRN

## 2011-12-02 NOTE — Telephone Encounter (Signed)
rx sent

## 2011-12-05 ENCOUNTER — Ambulatory Visit: Payer: Self-pay | Admitting: Internal Medicine

## 2011-12-05 ENCOUNTER — Ambulatory Visit (INDEPENDENT_AMBULATORY_CARE_PROVIDER_SITE_OTHER): Payer: Self-pay | Admitting: Physician Assistant

## 2011-12-05 ENCOUNTER — Encounter: Payer: Self-pay | Admitting: Physician Assistant

## 2011-12-05 VITALS — BP 118/76 | HR 88 | Temp 98.5°F | Ht 64.0 in | Wt 215.0 lb

## 2011-12-05 DIAGNOSIS — K5090 Crohn's disease, unspecified, without complications: Secondary | ICD-10-CM

## 2011-12-05 DIAGNOSIS — Z0289 Encounter for other administrative examinations: Secondary | ICD-10-CM

## 2011-12-05 DIAGNOSIS — K612 Anorectal abscess: Secondary | ICD-10-CM

## 2011-12-05 DIAGNOSIS — K610 Anal abscess: Secondary | ICD-10-CM

## 2011-12-05 NOTE — Progress Notes (Signed)
I have seen the pt with [name], and discussed the plans for MRI to r/o drainable lesion.

## 2011-12-05 NOTE — Progress Notes (Signed)
Subjective:    Patient ID: [name], female    DOB: [birth year], 21 y.o.   MRN: [id number]  HPI Nazly is a very nice 21 year old female well known to Dr. [name], with long history of Crohn's disease which has involved her duodenum, terminal ileum and colon. She is status post an ileocecectomy and diverting colostomy which was then reversed in January 2012. She has had problems with small bowel obstruction and was just recently admitted about 1 month ago with a partial small bowel obstruction which was felt secondary to adhesive disease and resolved with conservative management. She had CT scan of the abdomen and pelvis done at that time which did not show any evidence of active Crohn's of the small bowel to account for her partial small bowel obstruction. Unfortunately during that same admission however she was having problems with a rectal pain and drainage  developed a fever and then a left perirectal abscess which required surgical incision and drainage. She was treated with IV antibiotics and then discharged on a 7 day course of Cipro and Flagyl. Her last MRI of the pelvis was done 11/15/2011 prior to discharge which did show improvement in the inflammatory process in the lefischiorectal region but not complete resolution. She also had an ovarian cyst noted on the right.  Patient is maintained on 6-MP 50 mg by mouth daily and also is on Humira to 14 days. Since discharge  she has done well with no complaints of abdominal pain, her stools have been soft but have now normalized she has not had any rectal bleeding. She is eating without difficulty. About a week ago she developed some recurrent soreness in the rectal area after bowel movements and says she's uncomfortable for half an hour to an hour after a bowel movement. She also has started oozing small amounts of yellow mucus. She called and was started on Augmentin on  11/30/2011. She has been taking the Augmentin without any difficulty  but says that the drainage really has not changed. She says it's just small amounts of mucus that oozes intermittently. Her rectal discomfort is about the same as well. Again, no fever or chills and generally feels well.    Review of Systems  Constitutional: Negative.   HENT: Negative.   Eyes: Negative.   Respiratory: Negative.   Cardiovascular: Negative.   Gastrointestinal: Positive for rectal pain.  Genitourinary: Negative.   Musculoskeletal: Negative.   Neurological: Negative.   Hematological: Negative.   Psychiatric/Behavioral: Negative.    Outpatient Encounter Prescriptions as of 12/05/2011  Medication Sig Dispense Refill  . adalimumab (HUMIRA) 40 MG/0.8ML injection Inject 0.8 mLs (40 mg total) into the skin every 14 (fourteen) days. PLEASE GIVE PENS!!!  2 each  5  . albuterol (PROVENTIL HFA;VENTOLIN HFA) 108 (90 BASE) MCG/ACT inhaler Inhale 2 puffs into the lungs every 6 (six) hours as needed for wheezing.  1 Inhaler  1  . amoxicillin-clavulanate (AUGMENTIN) 875-125 MG per tablet Take 1 tablet by mouth daily.      Marland Kitchen esomeprazole (NEXIUM) 40 MG capsule Take 1 capsule (40 mg total) by mouth daily before breakfast.  90 capsule  0  . Fluconazole (DIFLUCAN PO) Take by mouth as directed.      Marland Kitchen LORazepam (ATIVAN) 1 MG tablet Take 1 tablet (1 mg total) by mouth 3 (three) times daily.  30 tablet  0  . mercaptopurine (PURINETHOL) 50 MG tablet Take 1 tablet (50 mg total) by mouth daily.  30 tablet  2  .  promethazine (PHENERGAN) 25 MG tablet Take 1 tablet (25 mg total) by mouth every 4 (four) hours as needed for nausea. Take 1 tablet by mouth every 4-6 hours as needed for nausea  30 tablet  0  . traMADol (ULTRAM) 50 MG tablet Take 1 or 2 tabs every 6 hours prn pain  20 tablet  1  . DISCONTD: hydrocortisone (ANUSOL-HC) 25 MG suppository Place 1 suppository (25 mg total) rectally every 12 (twelve) hours.  12 suppository  2   Allergies  Allergen Reactions  . Iron     REACTION: IV iron dextran   . Iron Dextran    Patient Active Problem List  Diagnoses  . GENITAL HERPES  . ANEMIA, IRON DEFICIENCY, CHRONIC  . ACUT DUOD ULCER W/O MENTION HEMORR PERF/OBST  . CROHN'S DISEASE  . UTI'S, RECURRENT  . MENORRHAGIA  . 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 NAUSEA, CHRONIC  . Acute sinus infection  . Acute bronchitis  . Wheezing  . SBO (small bowel obstruction)  . Hypokalemia  . Anxiety  . Cellulitis  . Acute renal failure  . Abscess of anal and rectal regions   History   Social History  . Marital Status: Single    Spouse Name: N/A    Number of Children: 0  . Years of Education: 15   Occupational History  . Student    Social History Main Topics  . Smoking status: Never Smoker   . Smokeless tobacco: Never Used  . Alcohol Use: 3.6 oz/week    0 Glasses of wine, 0 Cans of beer, 0 Drinks containing 0.5 oz of alcohol, 6 Shots of liquor per week  . Drug Use: No  . Sexually Active: Yes    Birth Control/ Protection: None   Other Topics Concern  . Not on file   Social History Narrative   Single, lives mother.       Objective:   Physical Exam well-developed young American female in no acute distress, pleasant blood pressure 118/76 pulse 88 temp 98 5 height 5 foot 4 weight 2:15. HEENT; nontraumatic normocephalic EOMI PERRLA sclera anicteric, Neck; supple no JVD, Cardiovascular; regular rate and rhythm with S1-S2 no murmur or gallop, Pulmonary; clear bilaterally, Abdomen; soft, nontender nondistended bowel sounds are active there is no palpable mass or hepatosplenomegaly ., Rectal; mild tenderness and firmness in the left. Anal area, she has a healing incision from recent surgical I&D. There is no fluctuance, there is small amount of yellow mucoid material around the perianal area. On digital exam she has some irregularity but no significant tenderness or fluctuance internally.,no fistulous opening evident externally. Extremities no clubbing cyanosis or edema skin warm  and dry. Psych; mood and affect normal and appropriate.        Assessment & Plan:  #95 21 year old female with Crohn's disease previously documented duodenal ileal and colonic involvement, status post ileocecectomy recent admission for partial small bowel obstruction felt secondary to adhesions which resolved with conservative management. #2 perirectal abscess status post surgical I/D April 2013 during that same admission, now with a recurrent mild left-sided rectal discomfort and drainage which is mucoid. She has been on Augmentin over the past 6 days Will need to rule out accumulation of abscess, and/or fistula development. #3 chronic maintenance therapy with Humira and 6-MP.  Plan; continue Augmentin 150 mg by mouth twice daily. Schedule for pelvic MRI within the next couple of days to reevaluate left ischiorectal area especially in light of ongoing immunosuppression. Further  plans pending results of MRI.

## 2011-12-05 NOTE — Patient Instructions (Signed)
Finish the Augmentin. Monitor your fever and drainage amount.   We scheduled the MRI Pelvis for Sunday 12-07-2011 .  Arrive at 11:45 AM to [facility].

## 2011-12-07 ENCOUNTER — Ambulatory Visit (HOSPITAL_COMMUNITY): Admission: RE | Admit: 2011-12-07 | Payer: Self-pay | Source: Ambulatory Visit

## 2011-12-09 ENCOUNTER — Ambulatory Visit (HOSPITAL_COMMUNITY)
Admission: RE | Admit: 2011-12-09 | Discharge: 2011-12-09 | Disposition: A | Discharge status: Home / Self Care | Payer: 59 | Source: Ambulatory Visit | Attending: Physician Assistant | Admitting: Physician Assistant

## 2011-12-09 DIAGNOSIS — K5090 Crohn's disease, unspecified, without complications: Secondary | ICD-10-CM | POA: Diagnosis present

## 2011-12-09 DIAGNOSIS — K603 Anal fistula, unspecified: Principal | ICD-10-CM | POA: Diagnosis present

## 2011-12-09 DIAGNOSIS — R188 Other ascites: Secondary | ICD-10-CM | POA: Insufficient documentation

## 2011-12-09 DIAGNOSIS — Z9049 Acquired absence of other specified parts of digestive tract: Secondary | ICD-10-CM

## 2011-12-09 DIAGNOSIS — K612 Anorectal abscess: Secondary | ICD-10-CM | POA: Insufficient documentation

## 2011-12-09 DIAGNOSIS — Z8614 Personal history of Methicillin resistant Staphylococcus aureus infection: Secondary | ICD-10-CM

## 2011-12-09 DIAGNOSIS — K610 Anal abscess: Secondary | ICD-10-CM

## 2011-12-09 DIAGNOSIS — K6289 Other specified diseases of anus and rectum: Secondary | ICD-10-CM | POA: Insufficient documentation

## 2011-12-09 DIAGNOSIS — Z86718 Personal history of other venous thrombosis and embolism: Secondary | ICD-10-CM

## 2011-12-09 DIAGNOSIS — N83209 Unspecified ovarian cyst, unspecified side: Secondary | ICD-10-CM | POA: Insufficient documentation

## 2011-12-10 ENCOUNTER — Telehealth: Payer: Self-pay | Admitting: *Deleted

## 2011-12-10 ENCOUNTER — Ambulatory Visit: Payer: Self-pay | Admitting: Internal Medicine

## 2011-12-10 DIAGNOSIS — K611 Rectal abscess: Secondary | ICD-10-CM

## 2011-12-10 MED ORDER — AMOXICILLIN-POT CLAVULANATE 875-125 MG PO TABS: 1.0000 | ORAL_TABLET | Freq: Every day | ORAL | Status: DC

## 2011-12-10 MED ORDER — METRONIDAZOLE 500 MG PO TABS: ORAL_TABLET | ORAL | Status: DC

## 2011-12-10 NOTE — Telephone Encounter (Signed)
Spoke with patient and told her the MRI did show perirectal abscess per Nicoletta Ba, PA. She will need to start Flagyl 500 mg BID x 10 days and continue Augmentin. Hold Humira.  Will schedule with [facility] for tomorrow. Spoke with Christy at [facility] and patient is scheduled on 12/11/11 at 3:00/3:30 PM. Patient aware.

## 2011-12-11 ENCOUNTER — Inpatient Hospital Stay (HOSPITAL_COMMUNITY): Payer: 59 | Admitting: Anesthesiology

## 2011-12-11 ENCOUNTER — Ambulatory Visit (INDEPENDENT_AMBULATORY_CARE_PROVIDER_SITE_OTHER): Payer: 59 | Admitting: Surgery

## 2011-12-11 ENCOUNTER — Encounter (INDEPENDENT_AMBULATORY_CARE_PROVIDER_SITE_OTHER): Payer: Self-pay | Admitting: Surgery

## 2011-12-11 ENCOUNTER — Encounter (HOSPITAL_COMMUNITY): Payer: Self-pay | Admitting: Anesthesiology

## 2011-12-11 ENCOUNTER — Other Ambulatory Visit: Payer: Self-pay

## 2011-12-11 ENCOUNTER — Inpatient Hospital Stay (HOSPITAL_COMMUNITY)
Admission: AD | Admit: 2011-12-11 | Discharge: 2011-12-12 | DRG: 348 | Disposition: A | Discharge status: Home / Self Care | Payer: 59 | Source: Ambulatory Visit | Attending: Surgery | Admitting: Surgery

## 2011-12-11 ENCOUNTER — Encounter (HOSPITAL_COMMUNITY): Payer: Self-pay | Admitting: Surgery

## 2011-12-11 ENCOUNTER — Encounter (HOSPITAL_COMMUNITY): Admission: AD | Disposition: A | Discharge status: Home / Self Care | Payer: Self-pay | Source: Ambulatory Visit

## 2011-12-11 VITALS — BP 110/70 | HR 84 | Temp 98.0°F | Resp 20 | Ht 64.0 in | Wt 220.8 lb

## 2011-12-11 DIAGNOSIS — K611 Rectal abscess: Secondary | ICD-10-CM

## 2011-12-11 DIAGNOSIS — K612 Anorectal abscess: Secondary | ICD-10-CM

## 2011-12-11 DIAGNOSIS — Z09 Encounter for follow-up examination after completed treatment for conditions other than malignant neoplasm: Secondary | ICD-10-CM

## 2011-12-11 DIAGNOSIS — R062 Wheezing: Secondary | ICD-10-CM

## 2011-12-11 HISTORY — PX: INCISION AND DRAINAGE PERIRECTAL ABSCESS: SHX1804

## 2011-12-11 LAB — CBC
HCT: 32.2 % — ABNORMAL LOW (ref 36.0–46.0)
Hemoglobin: 9.9 g/dL — ABNORMAL LOW (ref 12.0–15.0)
MCH: 25.6 pg — ABNORMAL LOW (ref 26.0–34.0)
MCHC: 30.7 g/dL (ref 30.0–36.0)
MCV: 83.2 fL (ref 78.0–100.0)
RDW: 18.1 % — ABNORMAL HIGH (ref 11.5–15.5)

## 2011-12-11 SURGERY — INCISION AND DRAINAGE, ABSCESS, PERIRECTAL
Anesthesia: General | Site: Rectum | Wound class: Dirty or Infected

## 2011-12-11 MED ORDER — PANTOPRAZOLE SODIUM 40 MG IV SOLR
40.0000 mg | Freq: Every day | INTRAVENOUS | Status: DC
  Administered 2011-12-11: 40 mg via INTRAVENOUS
  Filled 2011-12-11 (×2): qty 40

## 2011-12-11 MED ORDER — OXYCODONE HCL 5 MG PO TABS: 5.0000 mg | ORAL_TABLET | ORAL | Status: DC | PRN

## 2011-12-11 MED ORDER — SUCCINYLCHOLINE CHLORIDE 20 MG/ML IJ SOLN
INTRAMUSCULAR | Status: DC | PRN
  Administered 2011-12-11: 100 mg via INTRAVENOUS

## 2011-12-11 MED ORDER — FENTANYL CITRATE 0.05 MG/ML IJ SOLN
25.0000 ug | INTRAMUSCULAR | Status: DC | PRN
  Administered 2011-12-11: 50 ug via INTRAVENOUS

## 2011-12-11 MED ORDER — PIPERACILLIN-TAZOBACTAM 3.375 G IVPB
3.3750 g | Freq: Three times a day (TID) | INTRAVENOUS | Status: DC
  Administered 2011-12-11 – 2011-12-12 (×4): 3.375 g via INTRAVENOUS
  Filled 2011-12-11 (×6): qty 50

## 2011-12-11 MED ORDER — LACTATED RINGERS IV SOLN
INTRAVENOUS | Status: DC | PRN
  Administered 2011-12-11: 20:00:00 via INTRAVENOUS

## 2011-12-11 MED ORDER — 0.9 % SODIUM CHLORIDE (POUR BTL) OPTIME
TOPICAL | Status: DC | PRN
  Administered 2011-12-11: 1000 mL

## 2011-12-11 MED ORDER — DEXAMETHASONE SODIUM PHOSPHATE 10 MG/ML IJ SOLN
INTRAMUSCULAR | Status: DC | PRN
  Administered 2011-12-11: 10 mg via INTRAVENOUS

## 2011-12-11 MED ORDER — BUPIVACAINE-EPINEPHRINE 0.25% -1:200000 IJ SOLN
INTRAMUSCULAR | Status: DC | PRN
  Administered 2011-12-11: 20 mL

## 2011-12-11 MED ORDER — ACETAMINOPHEN 650 MG RE SUPP: 650.0000 mg | Freq: Four times a day (QID) | RECTAL | Status: DC | PRN

## 2011-12-11 MED ORDER — POTASSIUM CHLORIDE IN NACL 20-0.9 MEQ/L-% IV SOLN
INTRAVENOUS | Status: DC
  Administered 2011-12-12: 18:00:00 via INTRAVENOUS
  Filled 2011-12-11 (×4): qty 1000

## 2011-12-11 MED ORDER — ACETAMINOPHEN 325 MG PO TABS: 650.0000 mg | ORAL_TABLET | Freq: Four times a day (QID) | ORAL | Status: DC | PRN

## 2011-12-11 MED ORDER — PROMETHAZINE HCL 25 MG/ML IJ SOLN: 6.2500 mg | INTRAMUSCULAR | Status: DC | PRN

## 2011-12-11 MED ORDER — ONDANSETRON HCL 4 MG/2ML IJ SOLN
INTRAMUSCULAR | Status: DC | PRN
  Administered 2011-12-11: 4 mg via INTRAVENOUS

## 2011-12-11 MED ORDER — LACTATED RINGERS IV SOLN: INTRAVENOUS | Status: DC

## 2011-12-11 MED ORDER — HYDROMORPHONE HCL PF 1 MG/ML IJ SOLN
1.0000 mg | INTRAMUSCULAR | Status: DC | PRN
  Administered 2011-12-11 – 2011-12-12 (×7): 1 mg via INTRAVENOUS
  Filled 2011-12-11 (×7): qty 1

## 2011-12-11 MED ORDER — PROPOFOL 10 MG/ML IV BOLUS
INTRAVENOUS | Status: DC | PRN
  Administered 2011-12-11: 160 mg via INTRAVENOUS

## 2011-12-11 MED ORDER — FENTANYL CITRATE 0.05 MG/ML IJ SOLN
INTRAMUSCULAR | Status: DC | PRN
  Administered 2011-12-11: 150 ug via INTRAVENOUS
  Administered 2011-12-11: 100 ug via INTRAVENOUS

## 2011-12-11 MED ORDER — ALBUTEROL SULFATE HFA 108 (90 BASE) MCG/ACT IN AERS
2.0000 | INHALATION_SPRAY | Freq: Four times a day (QID) | RESPIRATORY_TRACT | Status: DC | PRN
  Filled 2011-12-11: qty 6.7

## 2011-12-11 MED ORDER — ACETAMINOPHEN 325 MG PO TABS: 650.0000 mg | ORAL_TABLET | ORAL | Status: DC | PRN

## 2011-12-11 MED ORDER — ONDANSETRON HCL 4 MG/2ML IJ SOLN: 4.0000 mg | Freq: Four times a day (QID) | INTRAMUSCULAR | Status: DC | PRN

## 2011-12-11 MED ORDER — LIDOCAINE HCL (CARDIAC) 20 MG/ML IV SOLN
INTRAVENOUS | Status: DC | PRN
  Administered 2011-12-11: 50 mg via INTRAVENOUS

## 2011-12-11 SURGICAL SUPPLY — 34 items
BLADE HEX COATED 2.75 (ELECTRODE) ×2 IMPLANT
BLADE SURG 15 STRL LF DISP TIS (BLADE) ×1 IMPLANT
BLADE SURG 15 STRL SS (BLADE) ×1
CANISTER SUCTION 2500CC (MISCELLANEOUS) ×2 IMPLANT
CLOTH BEACON ORANGE TIMEOUT ST (SAFETY) ×2 IMPLANT
COVER SURGICAL LIGHT HANDLE (MISCELLANEOUS) ×2 IMPLANT
DRAPE LG THREE QUARTER DISP (DRAPES) ×2 IMPLANT
DRSG PAD ABDOMINAL 8X10 ST (GAUZE/BANDAGES/DRESSINGS) ×4 IMPLANT
ELECT REM PT RETURN 9FT ADLT (ELECTROSURGICAL) ×2
ELECTRODE REM PT RTRN 9FT ADLT (ELECTROSURGICAL) ×1 IMPLANT
GAUZE PACKING IODOFORM 1 (PACKING) ×2 IMPLANT
GAUZE SPONGE 4X4 16PLY XRAY LF (GAUZE/BANDAGES/DRESSINGS) ×2 IMPLANT
GLOVE BIOGEL PI IND STRL 7.0 (GLOVE) ×1 IMPLANT
GLOVE BIOGEL PI IND STRL 7.5 (GLOVE) ×1 IMPLANT
GLOVE BIOGEL PI INDICATOR 7.0 (GLOVE) ×1
GLOVE BIOGEL PI INDICATOR 7.5 (GLOVE) ×1
GLOVE SURG ORTHO 8.0 STRL STRW (GLOVE) ×2 IMPLANT
GOWN STRL NON-REIN LRG LVL3 (GOWN DISPOSABLE) ×2 IMPLANT
GOWN STRL REIN XL XLG (GOWN DISPOSABLE) ×2 IMPLANT
KIT BASIN OR (CUSTOM PROCEDURE TRAY) ×2 IMPLANT
LUBRICANT JELLY K Y 4OZ (MISCELLANEOUS) IMPLANT
NEEDLE HYPO 25X1 1.5 SAFETY (NEEDLE) IMPLANT
PACK LITHOTOMY IV (CUSTOM PROCEDURE TRAY) ×2 IMPLANT
PAD ABD 7.5X8 STRL (GAUZE/BANDAGES/DRESSINGS) IMPLANT
PENCIL BUTTON HOLSTER BLD 10FT (ELECTRODE) ×2 IMPLANT
SOL PREP PROV IODINE SCRUB 4OZ (MISCELLANEOUS) ×2 IMPLANT
SPONGE GAUZE 4X4 12PLY (GAUZE/BANDAGES/DRESSINGS) ×2 IMPLANT
SWAB COLLECTION DEVICE MRSA (MISCELLANEOUS) IMPLANT
SYR BULB IRRIGATION 50ML (SYRINGE) ×2 IMPLANT
SYR CONTROL 10ML LL (SYRINGE) IMPLANT
TAPE CLOTH SURG 6X10 WHT LF (GAUZE/BANDAGES/DRESSINGS) ×2 IMPLANT
TOWEL OR 17X26 10 PK STRL BLUE (TOWEL DISPOSABLE) ×2 IMPLANT
UNDERPAD 30X30 INCONTINENT (UNDERPADS AND DIAPERS) ×2 IMPLANT
YANKAUER SUCT BULB TIP 10FT TU (MISCELLANEOUS) ×2 IMPLANT

## 2011-12-11 NOTE — Transfer of Care (Signed)
Immediate Anesthesia Transfer of Care Note  Patient: [name]  Procedure(s) Performed: Procedure(s) (LRB): IRRIGATION AND DEBRIDEMENT PERIRECTAL ABSCESS (N/A)  Patient Location: PACU  Anesthesia Type: General  Level of Consciousness: awake and alert   Airway & Oxygen Therapy: Patient Spontanous Breathing and Patient connected to face mask oxygen  Post-op Assessment: Report given to PACU RN and Post -op Vital signs reviewed and stable  Post vital signs: Reviewed and stable  Complications: No apparent anesthesia complications

## 2011-12-11 NOTE — Progress Notes (Signed)
Subjective:     Patient ID: [name], female   DOB: [birth year], 21 y.o.   MRN: [id number]  HPI This is a complex patient with history of Crohn's disease who is known to our practice. On May 1 she had incision and drainage of a perirectal abscess. She now has increasing discomfort and had an MRI today showing a complex collection in the left ischiorectal fossa with a fistula. She is having significant discomfort  Review of Systems     Objective:   Physical Exam On exam, there is a fistulous opening in the left perianal area with tenderness.    Assessment:     Complex perirectal abscess and history of Crohn's disease    Plan:     She will need to be admitted to the hospital to undergo incision and drainage of this complex abscess in the operating room. I will notify Dr. Lucia Gaskins who is the [facility] long doctor on-call

## 2011-12-11 NOTE — H&P (Signed)
[name] is an 21 y.o. female.   Chief Complaint: Perirectal abscess HPI: She is well known to our practice. She has a history of Crohn's disease has had resections for this in the past. She had a ostomy takedown in January by Dr. Brantley Stage. She had incision and drainage of a perirectal abscess on May 1 by Dr. Donne Hazel. She now is having increasing discomfort and drainage. An MRI shows a worsening ischiorectal fossa abscess and fistula. She denies any incontinence. She denies fevers. She is having moderate to severe pain  Past Medical History  Diagnosis Date  . 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 Anxiety   . Candida esophagitis   . Ovarian cyst, left   . Anemia     iron def  . DVT (deep venous thrombosis) 08/2010    Left IJ (postop) anticoag x 3 mo  . SVT (supraventricular tachycardia)   . Depression   . Small bowel obstruction   . Acute renal failure   . MRSA (methicillin resistant Staphylococcus aureus)     multiple abscesses    Past Surgical History  Procedure Date  . Cholecystectomy 2009  . Closure of ileostomy/loop 08/2010  . Colectomy with diverting loop ileostomy   . Appendectomy   . Esophagogastroduodenoscopy 11/11/2011    Procedure: ESOPHAGOGASTRODUODENOSCOPY (EGD);  Surgeon: Lafayette Dragon, MD;  Location: [facility];  Service: Endoscopy;  Laterality: N/A;  . Examination under anesthesia 11/12/2011    Procedure: EXAM UNDER ANESTHESIA;  Surgeon: Rolm Bookbinder, MD;  Location: [facility];  Service: General;  Laterality: N/A;  . Proctoscopy 11/12/2011    Procedure: PROCTOSCOPY;  Surgeon: Rolm Bookbinder, MD;  Location: [facility];  Service: General;  Laterality: N/A;  . Incision and drainage perirectal abscess 11/12/2011    Procedure: IRRIGATION AND DEBRIDEMENT PERIRECTAL ABSCESS;  Surgeon: Rolm Bookbinder, MD;  Location: [facility];  Service: General;  Laterality: N/A;    Family History  Problem Relation Age of Onset  .  Diabetes Maternal Uncle   . Breast cancer Maternal Grandmother   . Colon cancer Neg Hx   . Diabetes Cousin    Social History:  reports that she has never smoked. She has never used smokeless tobacco. She reports that she drinks about 3.6 ounces of alcohol per week. She reports that she does not use illicit drugs.  Allergies:  Allergies  Allergen Reactions  . Iron     REACTION: IV iron dextran  . Iron Dextran      (Not in a hospital admission)  No results found for this or any previous visit (from the past 48 hour(s)). Mr Pelvis Wo Contrast  12/10/2011  *RADIOLOGY REPORT*  Clinical Data: Persistent drainage, pain, evaluate for recurrent perianal abscess/fistula  MRI PELVIS WITHOUT CONTRAST  Technique:  Multi-planar multi-sequence MR imaging of the pelvis was performed following the standard protocol. No intravenous contrast was administered.  Comparison: MRI pelvis dated 11/15/2011.  CT abdomen pelvis dated 11/08/2011.  Findings: 3.0 x 1.8 x 4.1 cm complex fluid collection/abscess in the left ischiorectal fossa (series 7/image 32).  Fluid extends/tracks along the anterior aspect of the perianal region, with suspected trans-sphincteric perianal fistula at the 1 o'clock position (series 7/image 29).  Additional fistulous communication with the skin along the left gluteal cleft (series 7/image 31), likely accounting with the reported history of drainage.  Again seen is a complex cystic lesion in the right ovary (series 8/image 13), unchanged from recent MRI.  Uterus and left ovary are unremarkable.  Small volume pelvic ascites, likely physiologic.  Bladder is within normal limits.  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  Associated 4.1 cm complex fluid collection/abscess in the left ischiorectal fossa, as described above.  Fistulous communication with the skin along the left gluteal cleft.  Stable complex cystic lesion in the right ovary, attention  on follow-up suggested.  Original Report Authenticated By: Julian Hy, M.D.    Review of Systems  All other systems reviewed and are negative.    Blood pressure 110/70, pulse 84, temperature 98 F (36.7 C), temperature source Temporal, resp. rate 20, height 5' 4"  (1.626 m), weight 220 lb 12.8 oz (100.154 kg), last menstrual period 11/25/2011. Physical Exam  Constitutional: She appears well-developed and well-nourished. She appears distressed.  HENT:  Head: Normocephalic and atraumatic.  Right Ear: External ear normal.  Left Ear: External ear normal.  Nose: Nose normal.  Mouth/Throat: Oropharynx is clear and moist.  Eyes: Conjunctivae are normal. Pupils are equal, round, and reactive to light.  Neck: Normal range of motion. Neck supple.  Cardiovascular: Normal rate, regular rhythm and normal heart sounds.   Respiratory: Effort normal and breath sounds normal. No respiratory distress. She has no wheezes.  GI: Soft. Bowel sounds are normal.  Genitourinary:       There is a fistula opening in the left perianal area and purulence coming from her anus. There is significant perianal tenderness  Musculoskeletal: Normal range of motion.  Psychiatric: Her behavior is normal. Judgment normal.     Assessment/Plan Complex perirectal abscess with a history of Crohn's disease status post recent incision and drainage. She now has a fistula. Secondary to the large abscess and her tenderness, this will need to be drained in the operating room. She will be admitted and started on antibiotics.  Rhyland Hinderliter A 12/11/2011, 3:21 PM

## 2011-12-11 NOTE — Brief Op Note (Signed)
12/11/2011  8:33 PM  PATIENT:  [name] L Xxx-Sayres  21 y.o. female  PRE-OPERATIVE DIAGNOSIS:  perirectal abscess  POST-OPERATIVE DIAGNOSIS:  Same  PROCEDURE:  1.  Exam under anesthesia  2. Incision, drainage, and open packing of left perirectal abscess  SURGEON:  Surgeon(s) and Role:    * [name], MD - Primary  ANESTHESIA:   general  EBL:     BLOOD ADMINISTERED:none  DRAINS: 1 inch Iodoform gauze packing   LOCAL MEDICATIONS USED:  MARCAINE     SPECIMEN:  No Specimen  DISPOSITION OF SPECIMEN:  N/A  COUNTS:  YES  TOURNIQUET:  * No tourniquets in log *  DICTATION: .Other Dictation: Dictation Number [id number]  PLAN OF CARE: Admit for overnight observation  PATIENT DISPOSITION:  PACU - hemodynamically stable.   Delay start of Pharmacological VTE agent (>24hrs) due to surgical blood loss or risk of bleeding: yes  [name], MD, [facility] Office: [phone]

## 2011-12-11 NOTE — H&P (View-Only) (Signed)
[name] is an 21 y.o. female.   Chief Complaint: Perirectal abscess HPI: She is well known to our practice. She has a history of Crohn's disease has had resections for this in the past. She had a ostomy takedown in January by Dr. Brantley Stage. She had incision and drainage of a perirectal abscess on May 1 by Dr. Donne Hazel. She now is having increasing discomfort and drainage. An MRI shows a worsening ischiorectal fossa abscess and fistula. She denies any incontinence. She denies fevers. She is having moderate to severe pain  Past Medical History  Diagnosis Date  . 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 Anxiety   . Candida esophagitis   . Ovarian cyst, left   . Anemia     iron def  . DVT (deep venous thrombosis) 08/2010    Left IJ (postop) anticoag x 3 mo  . SVT (supraventricular tachycardia)   . Depression   . Small bowel obstruction   . Acute renal failure   . MRSA (methicillin resistant Staphylococcus aureus)     multiple abscesses    Past Surgical History  Procedure Date  . Cholecystectomy 2009  . Closure of ileostomy/loop 08/2010  . Colectomy with diverting loop ileostomy   . Appendectomy   . Esophagogastroduodenoscopy 11/11/2011    Procedure: ESOPHAGOGASTRODUODENOSCOPY (EGD);  Surgeon: Lafayette Dragon, MD;  Location: [facility];  Service: Endoscopy;  Laterality: N/A;  . Examination under anesthesia 11/12/2011    Procedure: EXAM UNDER ANESTHESIA;  Surgeon: Rolm Bookbinder, MD;  Location: [facility];  Service: General;  Laterality: N/A;  . Proctoscopy 11/12/2011    Procedure: PROCTOSCOPY;  Surgeon: Rolm Bookbinder, MD;  Location: [facility];  Service: General;  Laterality: N/A;  . Incision and drainage perirectal abscess 11/12/2011    Procedure: IRRIGATION AND DEBRIDEMENT PERIRECTAL ABSCESS;  Surgeon: Rolm Bookbinder, MD;  Location: [facility];  Service: General;  Laterality: N/A;    Family History  Problem Relation Age of Onset  .  Diabetes Maternal Uncle   . Breast cancer Maternal Grandmother   . Colon cancer Neg Hx   . Diabetes Cousin    Social History:  reports that she has never smoked. She has never used smokeless tobacco. She reports that she drinks about 3.6 ounces of alcohol per week. She reports that she does not use illicit drugs.  Allergies:  Allergies  Allergen Reactions  . Iron     REACTION: IV iron dextran  . Iron Dextran      (Not in a hospital admission)  No results found for this or any previous visit (from the past 48 hour(s)). Mr Pelvis Wo Contrast  12/10/2011  *RADIOLOGY REPORT*  Clinical Data: Persistent drainage, pain, evaluate for recurrent perianal abscess/fistula  MRI PELVIS WITHOUT CONTRAST  Technique:  Multi-planar multi-sequence MR imaging of the pelvis was performed following the standard protocol. No intravenous contrast was administered.  Comparison: MRI pelvis dated 11/15/2011.  CT abdomen pelvis dated 11/08/2011.  Findings: 3.0 x 1.8 x 4.1 cm complex fluid collection/abscess in the left ischiorectal fossa (series 7/image 32).  Fluid extends/tracks along the anterior aspect of the perianal region, with suspected trans-sphincteric perianal fistula at the 1 o'clock position (series 7/image 29).  Additional fistulous communication with the skin along the left gluteal cleft (series 7/image 31), likely accounting with the reported history of drainage.  Again seen is a complex cystic lesion in the right ovary (series 8/image 13), unchanged from recent MRI.  Uterus and left ovary are unremarkable.  Small volume pelvic ascites, likely physiologic.  Bladder is within normal limits.  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  Associated 4.1 cm complex fluid collection/abscess in the left ischiorectal fossa, as described above.  Fistulous communication with the skin along the left gluteal cleft.  Stable complex cystic lesion in the right ovary, attention  on follow-up suggested.  Original Report Authenticated By: Julian Hy, M.D.    Review of Systems  All other systems reviewed and are negative.    Blood pressure 110/70, pulse 84, temperature 98 F (36.7 C), temperature source Temporal, resp. rate 20, height 5' 4"  (1.626 m), weight 220 lb 12.8 oz (100.154 kg), last menstrual period 11/25/2011. Physical Exam  Constitutional: She appears well-developed and well-nourished. She appears distressed.  HENT:  Head: Normocephalic and atraumatic.  Right Ear: External ear normal.  Left Ear: External ear normal.  Nose: Nose normal.  Mouth/Throat: Oropharynx is clear and moist.  Eyes: Conjunctivae are normal. Pupils are equal, round, and reactive to light.  Neck: Normal range of motion. Neck supple.  Cardiovascular: Normal rate, regular rhythm and normal heart sounds.   Respiratory: Effort normal and breath sounds normal. No respiratory distress. She has no wheezes.  GI: Soft. Bowel sounds are normal.  Genitourinary:       There is a fistula opening in the left perianal area and purulence coming from her anus. There is significant perianal tenderness  Musculoskeletal: Normal range of motion.  Psychiatric: Her behavior is normal. Judgment normal.     Assessment/Plan Complex perirectal abscess with a history of Crohn's disease status post recent incision and drainage. She now has a fistula. Secondary to the large abscess and her tenderness, this will need to be drained in the operating room. She will be admitted and started on antibiotics.  [name] A 12/11/2011, 3:21 PM

## 2011-12-11 NOTE — Anesthesia Preprocedure Evaluation (Addendum)
Anesthesia Evaluation  Patient identified by MRN, date of birth, ID band Patient awake    Reviewed: Allergy & Precautions, H&P , NPO status , Patient's Chart, lab work & pertinent test results  Airway Mallampati: II TM Distance: >3 FB Neck ROM: Full    Dental  (+) Teeth Intact and Dental Advisory Given   Pulmonary neg pulmonary ROS,  breath sounds clear to auscultation  Pulmonary exam normal       Cardiovascular negative cardio ROS  + dysrhythmias Supra Ventricular Tachycardia Rhythm:Regular Rate:Normal     Neuro/Psych PSYCHIATRIC DISORDERS Anxiety Depression negative neurological ROS  negative psych ROS   GI/Hepatic Neg liver ROS, PUD, Crohns disease   Endo/Other  Morbid obesity  Renal/GU ARFRenal disease  negative genitourinary   Musculoskeletal negative musculoskeletal ROS (+)   Abdominal (+) + obese,   Peds negative pediatric ROS (+)  Hematology negative hematology ROS (+)   Anesthesia Other Findings   Reproductive/Obstetrics negative OB ROS                          Anesthesia Physical Anesthesia Plan  ASA: III and Emergent  Anesthesia Plan: General   Post-op Pain Management:    Induction: Intravenous  Airway Management Planned: Oral ETT  Additional Equipment:   Intra-op Plan:   Post-operative Plan: Extubation in OR  Informed Consent: I have reviewed the patients History and Physical, chart, labs and discussed the procedure including the risks, benefits and alternatives for the proposed anesthesia with the patient or authorized representative who has indicated his/her understanding and acceptance.   Dental advisory given  Plan Discussed with: CRNA  Anesthesia Plan Comments:         Anesthesia Quick Evaluation

## 2011-12-11 NOTE — Anesthesia Postprocedure Evaluation (Signed)
Anesthesia Post Note  Patient: [name]  Procedure(s) Performed: Procedure(s) (LRB): IRRIGATION AND DEBRIDEMENT PERIRECTAL ABSCESS (N/A)  Anesthesia type: General  Patient location: PACU  Post pain: Pain level controlled  Post assessment: Post-op Vital signs reviewed  Last Vitals:  Filed Vitals:   12/11/11 2045  BP:   Pulse:   Temp: 36.7 C  Resp:     Post vital signs: Reviewed  Level of consciousness: sedated  Complications: No apparent anesthesia complications

## 2011-12-11 NOTE — Interval H&P Note (Signed)
History and Physical Interval Note:  12/11/2011 7:34 PM  [name]  has presented today for surgery, with the diagnosis of perirectal abscess.  The various methods of treatment have been discussed with the patient and family. After consideration of risks, benefits and other options for treatment, the patient has consented to    Procedure(s) (LRB): IRRIGATION AND DEBRIDEMENT PERIRECTAL ABSCESS (N/A) as a surgical intervention .    The patients' history has been reviewed, patient examined, no change in status, stable for surgery.  I have reviewed the patients' chart and labs.  Questions were answered to the patient's satisfaction.    [name], MD, [facility] Office: [place]

## 2011-12-12 ENCOUNTER — Encounter (HOSPITAL_COMMUNITY): Payer: Self-pay | Admitting: Surgery

## 2011-12-12 MED ORDER — OXYCODONE-ACETAMINOPHEN 5-325 MG PO TABS: 1.0000 | ORAL_TABLET | ORAL | Status: DC | PRN

## 2011-12-12 MED ORDER — OXYCODONE-ACETAMINOPHEN 5-325 MG PO TABS: 1.0000 | ORAL_TABLET | ORAL | Status: AC | PRN

## 2011-12-12 MED ORDER — LORAZEPAM 1 MG PO TABS
1.0000 mg | ORAL_TABLET | Freq: Four times a day (QID) | ORAL | Status: DC | PRN
  Administered 2011-12-12: 1 mg via ORAL
  Filled 2011-12-12: qty 1

## 2011-12-12 MED ORDER — MERCAPTOPURINE 50 MG PO TABS
50.0000 mg | ORAL_TABLET | Freq: Every day | ORAL | Status: DC
  Administered 2011-12-12: 50 mg via ORAL
  Filled 2011-12-12: qty 1

## 2011-12-12 MED ORDER — DIPHENHYDRAMINE HCL 50 MG/ML IJ SOLN
25.0000 mg | Freq: Four times a day (QID) | INTRAMUSCULAR | Status: DC | PRN
  Administered 2011-12-12: 25 mg via INTRAVENOUS
  Filled 2011-12-12: qty 1

## 2011-12-12 MED ORDER — SULFAMETHOXAZOLE-TRIMETHOPRIM 800-160 MG PO TABS: 2.0000 | ORAL_TABLET | Freq: Two times a day (BID) | ORAL | Status: AC

## 2011-12-12 MED ORDER — PANTOPRAZOLE SODIUM 40 MG PO TBEC
40.0000 mg | DELAYED_RELEASE_TABLET | Freq: Every day | ORAL | Status: DC
  Administered 2011-12-12: 40 mg via ORAL
  Filled 2011-12-12: qty 1

## 2011-12-12 MED FILL — Bupivacaine Inj 0.25% w/ Epinephrine 1:200000: INTRAMUSCULAR | Qty: 50 | Status: AC

## 2011-12-12 NOTE — Discharge Instructions (Signed)
[facility] - DISCHARGE INSTRUCTIONS TO PATIENT  Return to work on:  12/22/2011  Activity:  Driving - may drive in 2 or 3 days if doing well   Lifting - no limit  Wound Care:   Sitz baths 3 times per day  Diet:  As tolerated  Follow up appointment:  Call Dr. Josetta Huddle office [facility]) at [phone] for an appointment in 2 top 3 weeks.  Medications and dosages:  Resume your home medications.  You have a prescription for:  Septra (for 6 more days) and Roxicet.  Call Dr. Brantley Stage or his office  [phone]) if you have:  Temperature greater than 100.4,  Persistent nausea and vomiting,  Severe uncontrolled pain,  Redness, tenderness, or signs of infection (pain, swelling, redness, odor or green/yellow discharge around the site),  Difficulty breathing, headache or visual disturbances,  Any other questions or concerns you may have after discharge.  In an emergency, call 911 or go to an Emergency Department at a nearby hospital.

## 2011-12-12 NOTE — Progress Notes (Signed)
1 Day Post-Op  Subjective: Lots of questions, I will have to get some pain med in her to even get the packing out. I cannot get more and a couple inches in.  She just cannot tolerate it.  Objective: Vital signs in last 24 hours: Temp:  [97.4 F (36.3 C)-98.1 F (36.7 C)] 97.4 F (36.3 C) (05/31 0557) Pulse Rate:  [61-84] 61  (05/31 0557) Resp:  [16-20] 16  (05/31 0557) BP: (110-143)/(70-91) 118/72 mmHg (05/31 0557) SpO2:  [99 %-100 %] 100 % (05/31 0557) Weight:  [99.383 kg (219 lb 1.6 oz)-101.379 kg (223 lb 8 oz)] 101.379 kg (223 lb 8 oz) (05/30 1700)    Intake/Output from previous day: 05/30 0701 - 05/31 0700 In: 7159 [P.O.:240; I.V.:1100] Out: 1025 [Urine:1025] Intake/Output this shift:    General appearance: alert, cooperative, severe distress and with manipulation of packing in wound. About 2 yards of packing.  remove, site is clean but I cannot repack it.  Lab Results:   [name] 12/11/11 1716  WBC 7.3  HGB 9.9*  HCT 32.2*  PLT 345    BMET No results found for this basename: NA:2,K:2,CL:2,CO2:2,GLUCOSE:2,BUN:2,CREATININE:2,CALCIUM:2 in the last 72 hours PT/INR No results found for this basename: LABPROT:2,INR:2 in the last 72 hours  No results found for this basename: AST:5,ALT:5,ALKPHOS:5,BILITOT:5,PROT:5,ALBUMIN:5 in the last 168 hours   Lipase     Component Value Date/Time   LIPASE 10* 11/08/2011 0725     Studies/Results: No results found.  Medications:    . pantoprazole (PROTONIX) IV  40 mg Intravenous QHS  . piperacillin-tazobactam (ZOSYN)  IV  3.375 g Intravenous Q8H    Assessment/Plan perirectal abscess s/p Exam under anesthesia 2. Incision, drainage, and open packing of left perirectal abscess  Patient Active Problem List  Diagnoses  . GENITAL HERPES  . ANEMIA, IRON DEFICIENCY, CHRONIC  . ACUT DUOD ULCER W/O MENTION HEMORR PERF/OBST  . CROHN'S DISEASE  . UTI'S, RECURRENT  . MENORRHAGIA  . ACUTE VENOUS EMBO & THROMB INTRL JUGULAR VEINS    . NAUSEA, CHRONIC  . Acute sinus infection  . Acute bronchitis  . Wheezing  . SBO (small bowel obstruction)  . Hypokalemia  . Anxiety  . Cellulitis  . Acute renal failure  . Abscess of anal and rectal regions    Plan:  Discuss with DR. Arnett Galindez    LOS: 1 day   [name],[name] 12/12/2011  Doing well.  Ready to go home.  Instructions reviewed with patient.  To follow up with Dr. Brantley Stage since he did her original resection.  [name], MD, [facility] Pager: [phone] Office phone:  [phone]

## 2011-12-12 NOTE — Progress Notes (Signed)
Pt dc home, dc'd iv, dc papers given, rx given. Dc home.Fara Olden P

## 2011-12-12 NOTE — Op Note (Signed)
NAMEDALENE, ROBARDS NO.:  [id number]  MEDICAL RECORD NO.:  [id number]  LOCATION:  0071                         FACILITY:  [facility]  PHYSICIAN:  [name], MD      DATE OF BIRTH:  January 12, 1991  DATE OF PROCEDURE:  12/11/2011                               OPERATIVE REPORT   PREOPERATIVE DIAGNOSIS:  Left perirectal abscess.  POSTOPERATIVE DIAGNOSIS:  Left perirectal abscess.  PROCEDURES: 1. Examination under anesthesia. 2. Incision and drainage and open packing of left perirectal abscess.  SURGEON:  [name], MD, FACS  ANESTHESIA:  General per Dr. Freddie Apley.  ESTIMATED BLOOD LOSS:  Minimal.  PREPARATION:  Betadine.  COMPLICATIONS:  None.  INDICATIONS:  The patient is a 21 year old black female,  with a history of Crohn disease.  The patient had developed an abscess 1 month ago and underwent operative incision and drainage.  Abscess has recurred and is documented on MRI scan in the left ischiorectal fossa.  The patient is prepared and brought to the operating room for operative drainage.  BODY OF REPORT:  Procedure was done in OR #11 at [facility].  The patient was brought to the operating room, placed in the supine position on the operating room table.  Following administration of general anesthesia, the patient was placed in lithotomy using the Yellofin stirrups.  Perineum was prepped and draped in the usual strict aseptic fashion.  After ascertaining that an adequate level of anesthesia had been achieved, the perineum was examined.  There was a previous surgical wound in the medial left buttock.  This was draining serosanguineous fluid.  It was probed with a blunt-tip probe to a depth of 8 cm.  The skin was then incised with the electrocautery and a small ellipse of skin measuring 1.5 x 1 cm was excised so as to encompass the previous fistulous opening.  The cavity was then debrided using the suction and a digit  for dissection.  Loculations were broken down and fluid was completely evacuated.  Cavity was then copiously irrigated with warm saline which was evacuated.  The cavity measures 8 cm in depth in the perirectal space.  The cavity was packed with 1 inch iodoform gauze packing, and then covered with dry gauze dressings.  The skin surrounding the incision was anesthetized with 20 mL of local anesthetic.  ABD pads were placed.  The patient was taken out in lithotomy and awakened from anesthesia.  The patient was brought to the recovery room.  The patient tolerated the procedure well.   [name], MD, FACS   TMG/MEDQ  D:  12/11/2011  T:  12/12/2011  Job:  [id number]

## 2011-12-24 NOTE — Discharge Summary (Signed)
General Surgery [facility] - Attending  Agree.  [name], MD, [facility] Office: [phone]

## 2011-12-24 NOTE — Discharge Summary (Signed)
Physician Discharge Summary  Patient ID: [name] MRN: [id number] DOB/AGE: 23/01/1991 23 y.o.  Admit date: 12/11/2011 Discharge date: 12/24/2011  Admission Diagnoses:  perirectal abscess      Discharge Diagnoses: SAME Active Problems:  * No active hospital problems. *    PROCEDURES: 1. Exam under anesthesia 2. Incision, drainage, and open packing of left perirectal abscess      Hospital Course: She is well known to our practice. She has a history of Crohn's disease has had resections for this in the past. She had a ostomy takedown in January by Dr. Brantley Stage. She had incision and drainage of a perirectal abscess on May 1 by Dr. Donne Hazel. She now is having increasing discomfort and drainage. An MRI shows a worsening ischiorectal fossa abscess and fistula. She denies any incontinence. She denies fevers. She is having moderate to severe pain She underwent surgery by DR. Gerkin and was discharged home later that evening from PACU   Disposition: 01-Home or Self Care  Discharge Orders    Future Orders Please Complete By Expires   Diet - low sodium heart healthy      Increase activity slowly        Medication List  As of 12/24/2011  4:54 PM   TAKE these medications         adalimumab 40 MG/0.8ML injection   Commonly known as: HUMIRA   Inject 0.8 mLs (40 mg total) into the skin every 14 (fourteen) days. PLEASE GIVE PENS!!!      albuterol 108 (90 BASE) MCG/ACT inhaler   Commonly known as: PROVENTIL HFA;VENTOLIN HFA   Inhale 2 puffs into the lungs every 6 (six) hours as needed for wheezing.      amoxicillin-clavulanate 875-125 MG per tablet   Commonly known as: AUGMENTIN   Take 1 tablet by mouth daily.      DIFLUCAN PO   Take by mouth as directed.      esomeprazole 40 MG capsule   Commonly known as: NEXIUM   Take 80 mg by mouth daily before breakfast. TAKE 2 CAP      LORazepam 1 MG tablet   Commonly known as: ATIVAN   Take 1 mg by mouth every 6 (six) hours as  needed. ANXIETY      mercaptopurine 50 MG tablet   Commonly known as: PURINETHOL   Take 1 tablet (50 mg total) by mouth daily.      metroNIDAZOLE 500 MG tablet   Commonly known as: FLAGYL   Take one po BID x 10 days      oxyCODONE-acetaminophen 5-325 MG per tablet   Commonly known as: PERCOCET   Take 1 tablet by mouth every 4 (four) hours as needed. PAIN      promethazine 25 MG tablet   Commonly known as: PHENERGAN   Take 1 tablet (25 mg total) by mouth every 4 (four) hours as needed for nausea. Take 1 tablet by mouth every 4-6 hours as needed for nausea           Follow-up Information    Follow up with CORNETT,THOMAS A., MD in 3 weeks.   Contact information:   [facility], [place], [place] [phone]          Signed: [name] 12/24/2011, 4:54 PM

## 2011-12-25 ENCOUNTER — Telehealth: Payer: Self-pay | Admitting: Internal Medicine

## 2011-12-25 NOTE — Telephone Encounter (Signed)
Patient's mother calling to report patient she had to be admitted for drainage of perirectal abscess on 12/11/11. States the patient feels like this is not healing and maybe another abscess is coming up. She has stomach pain also. She states patient is taking Flagyl and Cipro. Suggested she call surgeon and offered to schedule patient with extender tomorrow. Patient's mother states the patient is at work and she will have to text her and see if she will schedule OV. Also, told mother that if patient has fever or severe pain she will have to go to ED tonight.

## 2011-12-25 NOTE — Telephone Encounter (Signed)
Spoke with patient and she is coming to see [name], NP tomorrow at 8:30 AM.

## 2011-12-25 NOTE — Telephone Encounter (Signed)
Spoke with patient. She thinks it is her Crohn's disease which is flaring up because she has been off Humira since April. I told her to start on Humira 25m IM weekly x4 then every 2 weeks. She has several doses at home. She will have to be pre certified for more. Also , please refill Ativan, Promethazine, and start Flagyl 250 mg po tid #40, 1 refill, she  Will not see PNevin Bloodgoodtomorrow because she has already missed too much work..Risa Grill

## 2011-12-26 ENCOUNTER — Other Ambulatory Visit: Payer: Self-pay | Admitting: *Deleted

## 2011-12-26 ENCOUNTER — Ambulatory Visit: Payer: 59 | Admitting: Nurse Practitioner

## 2011-12-26 MED ORDER — PROMETHAZINE HCL 25 MG PO TABS: 25.0000 mg | ORAL_TABLET | ORAL | Status: DC | PRN

## 2011-12-26 MED ORDER — ADALIMUMAB 40 MG/0.8ML ~~LOC~~ KIT: 40.0000 mg | PACK | SUBCUTANEOUS | Status: DC

## 2011-12-26 MED ORDER — LORAZEPAM 1 MG PO TABS: 1.0000 mg | ORAL_TABLET | Freq: Four times a day (QID) | ORAL | Status: DC | PRN

## 2011-12-26 MED ORDER — METRONIDAZOLE 250 MG PO TABS: ORAL_TABLET | ORAL | Status: DC

## 2011-12-26 NOTE — Telephone Encounter (Signed)
Patient states that she has enough Humira at home to give herself an injection every week x 4 weeks. I have sent a prescription to the pharmacy for Humira 40 mg IM every 2 weeks.

## 2011-12-26 NOTE — Telephone Encounter (Signed)
OV cancelled. Refilled Ativan, Promethazine and Flagyl ordered.

## 2011-12-30 ENCOUNTER — Telehealth: Payer: Self-pay | Admitting: *Deleted

## 2011-12-30 NOTE — Telephone Encounter (Signed)
I have spoken to the patient and advised her that I will send a letter of appeal to [facility] on her behalf for Humira before attempting to send her Cimzia.

## 2011-12-30 NOTE — Telephone Encounter (Signed)
I have mailed a letter to [employer] at [employer], [place] [address] and have made patient aware of this as well.

## 2011-12-30 NOTE — Telephone Encounter (Signed)
Per patient request, we will try Humira appeal first.

## 2011-12-30 NOTE — Telephone Encounter (Signed)
Message copied by Larina Bras on Tue Dec 30, 2011  1:38 PM ------      Message from: Lafayette Dragon      Created: Mon Dec 29, 2011  4:13 PM       Yes, please try [facility] Sinning, it may work. We don't have any choice.      ----- Message -----         From: Larina Bras, CMA         Sent: 12/29/2011   8:08 AM           To: Lafayette Dragon, MD            Dr [name]-      Just got a note from insurance that the Prior Auth I attempted for patient's Humira has been denied. They want her to try and fail Cimzia first. Do you want me to initiate Cimzia?

## 2011-12-31 ENCOUNTER — Telehealth: Payer: Self-pay | Admitting: Internal Medicine

## 2011-12-31 NOTE — Telephone Encounter (Signed)
Prior [name] was already denied. I have written an appeal letter to patient's insurance and am awaiting a response. The patient is already aware of this. I have spoken to [place] at [facility] to advise her of this

## 2012-01-05 ENCOUNTER — Telehealth: Payer: Self-pay | Admitting: Internal Medicine

## 2012-01-05 NOTE — Telephone Encounter (Signed)
Yes, please, refill Valtrex and decrease Humira to the initial dose

## 2012-01-05 NOTE — Telephone Encounter (Signed)
Patient has still be denied for Humira even after appeal. I have asked patient to come in to sign paperwork for Cimzia. Patient also states that she is getting fever blisters again with the increase in Humira dosing. Do you want to refill her valtrex?

## 2012-01-06 MED ORDER — ACYCLOVIR 800 MG PO TABS: ORAL_TABLET | ORAL | Status: DC

## 2012-01-06 NOTE — Telephone Encounter (Signed)
rx sent for acyclovir (last rx given to patient for cold sores). Patient advised. She also verbalizes understanding that she should again decrease Humira dosing to once every 2 weeks.

## 2012-01-16 ENCOUNTER — Telehealth: Payer: Self-pay | Admitting: Internal Medicine

## 2012-01-16 NOTE — Telephone Encounter (Signed)
Spoke with patient. She wonders where Cimzia script was sent. I have advise that it was sent to [facility]. She verbalizes understanding.

## 2012-01-16 NOTE — Telephone Encounter (Signed)
Left message for patient to call back  

## 2012-01-19 ENCOUNTER — Telehealth: Payer: Self-pay | Admitting: Internal Medicine

## 2012-01-19 NOTE — Telephone Encounter (Signed)
She needs to be seen by PA and also, she needs to be closely followed by her surgeon. She has seen several of them but no one for continuous care. Last MRI 11/2011, may need a repeat Please start her on Augmentin 875 mg po bid #20. And refill her pain meds. Thanx

## 2012-01-19 NOTE — Telephone Encounter (Signed)
Patient continues to have rectal pain as well as pus and blood draining from her rectum. Patient thinks she may have had a fever last night. She has ran out of pain medication and states that she will need more medication. She has been taken Flagyl (has 1 refill left) and has taken Cipro and Septra in the past with no improvement of symptoms. Please advise.

## 2012-01-20 ENCOUNTER — Other Ambulatory Visit: Payer: Self-pay | Admitting: *Deleted

## 2012-01-20 MED ORDER — AMOXICILLIN-POT CLAVULANATE 875-125 MG PO TABS: 1.0000 | ORAL_TABLET | Freq: Two times a day (BID) | ORAL | Status: AC

## 2012-01-20 MED ORDER — PROMETHAZINE HCL 25 MG PO TABS: 25.0000 mg | ORAL_TABLET | ORAL | Status: DC | PRN

## 2012-01-20 MED ORDER — OXYCODONE-ACETAMINOPHEN 5-325 MG PO TABS
1.0000 | ORAL_TABLET | ORAL | Status: DC | PRN
Start: 2012-01-20 — End: 2012-02-13

## 2012-01-20 NOTE — Telephone Encounter (Signed)
Informed pt of Dr Nichola Sizer orders. She will pick up the script for Percocet and see Nicoletta Ba, PA tomorrow at 2pm.

## 2012-01-20 NOTE — Telephone Encounter (Signed)
lmom for pt to call back

## 2012-01-20 NOTE — Telephone Encounter (Signed)
Patient requested refill on phenergan. Rx sent.

## 2012-01-21 ENCOUNTER — Ambulatory Visit (INDEPENDENT_AMBULATORY_CARE_PROVIDER_SITE_OTHER): Payer: 59 | Admitting: Physician Assistant

## 2012-01-21 ENCOUNTER — Encounter: Payer: Self-pay | Admitting: Physician Assistant

## 2012-01-21 VITALS — BP 112/70 | HR 88 | Temp 98.4°F | Ht 64.0 in | Wt 223.0 lb

## 2012-01-21 DIAGNOSIS — K612 Anorectal abscess: Secondary | ICD-10-CM

## 2012-01-21 DIAGNOSIS — R1032 Left lower quadrant pain: Secondary | ICD-10-CM

## 2012-01-21 DIAGNOSIS — K5090 Crohn's disease, unspecified, without complications: Secondary | ICD-10-CM

## 2012-01-21 DIAGNOSIS — K611 Rectal abscess: Secondary | ICD-10-CM

## 2012-01-21 NOTE — Patient Instructions (Addendum)
Go to [facility] for the MRI/Pelvis tomorrow 01-22-2012.  Arrive at 7:45 AM.

## 2012-01-21 NOTE — Progress Notes (Signed)
Subjective:    Patient ID: [name], female    DOB: 01-21-1991, 21 y.o.   MRN: [id number]  HPI [name] is a very nice 21 year old African American female known to Dr. 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 She is status post ileocolectomy with diverting colostomy which was then reversed in January of 2012. She has been maintained on Humira and Purinethol. Unfortunately she developed a perirectal abscess in early May of 2013 which required I&D. She had recurrent symptoms towards the end of May 2013 and an MRI of the pelvis was done at that time which showed a worsening issue rectal fossa abscess and fistula. She was admitted to the hospital placed on IV antibiotics and underwent I&D per Dr. Harlow [name] and then was discharged home .Marland Kitchen Since that time she has had persistent drainage of U. mucoid material which is never resolved. She has been on 2 courses of Flagyl a course of Septra a course of Cipro and has now started Augmentin as of yesterday. She says her symptoms became abruptly worse this past Saturday, 01/17/2012 with increased rectal pain and discharge. She says at this point it hurts to sit and have a bowel movement. She has not been having any diarrhea melena or hematochezia. She does feel that she's been having hot and cold episodes and has had sweats at night over the past couple of days. She has not had any bloody drainage.    Review of Systems  Constitutional: Positive for fever.  HENT: Negative.   Eyes: Negative.   Respiratory: Negative.   Cardiovascular: Negative.   Gastrointestinal: Positive for rectal pain.  Genitourinary: Negative.   Musculoskeletal: Negative.   Neurological: Negative.   Hematological: Negative.   Psychiatric/Behavioral: Negative.    Outpatient Prescriptions Prior to Visit  Medication Sig Dispense Refill  . acyclovir (ZOVIRAX) 800 MG tablet Take 1 tablet by mouth three times daily x 1 week   21 tablet  0  . adalimumab (HUMIRA) 40 MG/0.8ML injection Inject 0.8 mLs (40 mg total) into the skin every 14 (fourteen) days. PLEASE GIVE PENS!!!  2 each  3  . amoxicillin-clavulanate (AUGMENTIN) 875-125 MG per tablet Take 1 tablet by mouth 2 (two) times daily.  20 tablet  0  . esomeprazole (NEXIUM) 40 MG capsule Take 80 mg by mouth daily before breakfast. TAKE 2 CAP      . Fluconazole (DIFLUCAN PO) Take by mouth as directed.      Marland Kitchen LORazepam (ATIVAN) 1 MG tablet Take 1 tablet (1 mg total) by mouth every 6 (six) hours as needed. ANXIETY  30 tablet  0  . mercaptopurine (PURINETHOL) 50 MG tablet Take 1 tablet (50 mg total) by mouth daily.  30 tablet  2  . oxyCODONE-acetaminophen (PERCOCET) 5-325 MG per tablet Take 1 tablet by mouth every 4 (four) hours as needed. PAIN  30 tablet  0  . promethazine (PHENERGAN) 25 MG tablet Take 1 tablet (25 mg total) by mouth every 4 (four) hours as needed for nausea. Take 1 tablet by mouth every 4-6 hours as needed for nausea  30 tablet  0  . albuterol (PROVENTIL HFA;VENTOLIN HFA) 108 (90 BASE) MCG/ACT inhaler Inhale 2 puffs into the lungs every 6 (six) hours as needed for wheezing.  1 Inhaler  1  . metroNIDAZOLE (FLAGYL) 250 MG tablet Take one po  TID  40 tablet  1  . metroNIDAZOLE (FLAGYL) 500 MG tablet Take one po BID  x 10 days  20 tablet  0   Allergies  Allergen Reactions  . Iron Dextran Anaphylaxis  . Iron     REACTION: IV iron dextran   Patient Active Problem List  Diagnosis  . GENITAL HERPES  . ANEMIA, IRON DEFICIENCY, CHRONIC  . ACUT DUOD ULCER W/O MENTION HEMORR PERF/OBST  . CROHN'S DISEASE  . UTI'S, RECURRENT  . MENORRHAGIA  . 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 NAUSEA, CHRONIC  . Acute sinus infection  . Acute bronchitis  . SBO (small bowel obstruction)  . Hypokalemia  . Anxiety  . Cellulitis  . Acute renal failure  . Abscess of anal and rectal regions   History   Social History  . Marital Status: Single    Spouse  Name: N/A    Number of Children: 0  . Years of Education: 15   Occupational History  . Student    Social History Main Topics  . Smoking status: Never Smoker   . Smokeless tobacco: Never Used  . Alcohol Use: 3.6 oz/week    0 Glasses of wine, 0 Cans of beer, 0 Drinks containing 0.5 oz of alcohol, 6 Shots of liquor per week  . Drug Use: No  . Sexually Active: Yes    Birth Control/ Protection: None   Other Topics Concern  . Not on file   Social History Narrative   Single, lives mother.       Objective:   Physical Exam  well-developed young [place] American female in no acute distress, pleasant. Blood pressure 112 or 70, pulse 88, temp 98 4, height 5 foot 4, weight 233. HEENT; nontraumatic normocephalic EOMI PERRLA sclera anicteric,Neck; Supple no JVD, Cardiovascular; regular rate and rhythm with S1-S2 no murmur or gallop, Pulmonary ;clear bilaterally, Abdomen; soft nontender nondistended no palpable mass or hepatosplenomegaly bowel sounds are active, Rectal exam she has a scar anorectum she's quite tender to digital exam especially on the left there is no definite fluctuance helpful though she does have some induration and firmness in the left perirectal area where there is a visible fistula which has a small amount of yellow mucoid drainage She is quite tender over this area. Extremities; no clubbing cyanosis or edema skin warm dry, Psych; mood and affect normal and appropriate        Assessment & Plan:  #92 21 year old female with Crohn's ileocolitis him a nail with rectal disease and persistent fistulous drainage. She has had 2 recent perirectal abscesses 03 requiring surgical I&D comes in today with progressive rectal pain complaint of fevers and increased mucoid drainage. I suspect she does have another perirectal abscess. #2 patient immunosuppressed with Purinethol and Humira. She is in the process of switching to see nausea which she should start later this week.  Plan; and tenia  Augmentin 875 twice daily Percocet 5/325 every 4-6 hours as needed for pain Have scheduled for repeat MRI of the pelvis early tomorrow morning, this is to be a call report and if she has a recurrent abscess will likely require admission for I&D. Her disease is poorly controlled at this time and with multiple recurrent perirectal abscesses believe she will benefit from referral to a [facility] care center to see a colorectal surgeon specializing in IBD and she may need setons placed and eventually have fistulectomies etc I've asked her to hold off on taking a biologic this week, she was to start Cimzia but will hold until we have his current infection resolved.

## 2012-01-22 ENCOUNTER — Ambulatory Visit (HOSPITAL_COMMUNITY)
Admission: RE | Admit: 2012-01-22 | Discharge: 2012-01-22 | Disposition: A | Discharge status: Home / Self Care | Payer: 59 | Source: Ambulatory Visit | Attending: Physician Assistant | Admitting: Physician Assistant

## 2012-01-22 ENCOUNTER — Other Ambulatory Visit: Payer: Self-pay | Admitting: Physician Assistant

## 2012-01-22 DIAGNOSIS — K603 Anal fistula, unspecified: Secondary | ICD-10-CM | POA: Insufficient documentation

## 2012-01-22 DIAGNOSIS — R1032 Left lower quadrant pain: Secondary | ICD-10-CM

## 2012-01-22 DIAGNOSIS — N83209 Unspecified ovarian cyst, unspecified side: Secondary | ICD-10-CM | POA: Insufficient documentation

## 2012-01-22 DIAGNOSIS — K612 Anorectal abscess: Secondary | ICD-10-CM | POA: Insufficient documentation

## 2012-01-22 DIAGNOSIS — K5090 Crohn's disease, unspecified, without complications: Secondary | ICD-10-CM

## 2012-01-22 MED ORDER — GADOBENATE DIMEGLUMINE 529 MG/ML IV SOLN
20.0000 mL | Freq: Once | INTRAVENOUS | Status: AC | PRN
  Administered 2012-01-22: 20 mL via INTRAVENOUS

## 2012-01-22 NOTE — Progress Notes (Signed)
Recurrent perianal Crohn's disease and abscess is suspected. MRI pelvis ordered.  Agree with Ms. Genia Harold assessment and plan. Gatha Mayer, MD, Marval Regal

## 2012-01-23 ENCOUNTER — Telehealth: Payer: Self-pay | Admitting: *Deleted

## 2012-01-23 ENCOUNTER — Telehealth: Payer: Self-pay | Admitting: Internal Medicine

## 2012-01-23 NOTE — Telephone Encounter (Signed)
Patient states that she is having worsening back pain and continued rectal drainage (about the same as 2 days ago) as well as rectal pain. She has been on Amoxicillin x 2 days now. She is scheduled for a follow up with Dr [name] on Tuesday, 01-27-12. Any suggestions in the mean time.

## 2012-01-23 NOTE — Telephone Encounter (Signed)
I have given [name] and Dr [name] both verbal communication on patient's condition. She will follow up with [place] on Tuesday.

## 2012-01-23 NOTE — Telephone Encounter (Signed)
Per Nicoletta Ba PA patient is to hold cimzia.  She will discuss with Dr. [name] on Tuesday at her appt

## 2012-01-26 ENCOUNTER — Telehealth: Payer: Self-pay | Admitting: Internal Medicine

## 2012-01-26 NOTE — Telephone Encounter (Signed)
Patient states that she may not be able to come to work tomorrow because her job wont let her off. I have advised that we really need to see her especially since she has an abscess. Patient verbalizes understanding.

## 2012-01-26 NOTE — Telephone Encounter (Signed)
Left message for patient to call back  

## 2012-01-27 ENCOUNTER — Ambulatory Visit: Payer: 59 | Admitting: Internal Medicine

## 2012-01-27 ENCOUNTER — Telehealth: Payer: Self-pay | Admitting: *Deleted

## 2012-01-27 NOTE — Telephone Encounter (Signed)
Message copied by Larina Bras on Tue Jan 27, 2012  8:06 AM ------      Message from: Lafayette Dragon      Created: Mon Jan 26, 2012 10:03 PM      Regarding: will not come for OV       Dotti, I spoke to [employer], she is doing much better, will not keep her appointment so she would not miss work. She will check with [place] next week.

## 2012-01-28 ENCOUNTER — Telehealth: Payer: Self-pay | Admitting: Internal Medicine

## 2012-01-28 NOTE — Telephone Encounter (Signed)
Left message for patient to call back  

## 2012-01-29 NOTE — Telephone Encounter (Signed)
Patient states that she was supposed to get Diflucan at her pharmacy. Dr [name], did you want her to have diflucan 100 mg po qd x 3??? Also, patient would like to discuss her future ability to have children in regards to the medications she is on etc.

## 2012-01-29 NOTE — Telephone Encounter (Signed)
Please send Diflucan 147m, #6 1 po qd x3, may repeat if yeast infection  recurrs

## 2012-01-29 NOTE — Telephone Encounter (Signed)
Left message for patient to call back  

## 2012-01-30 MED ORDER — FLUCONAZOLE 100 MG PO TABS: ORAL_TABLET | ORAL | Status: DC

## 2012-01-30 NOTE — Telephone Encounter (Signed)
rx sent

## 2012-02-02 ENCOUNTER — Telehealth: Payer: Self-pay | Admitting: Internal Medicine

## 2012-02-02 NOTE — Telephone Encounter (Signed)
Please finish the Augmentin and then start Doxycycline 100 mg po qd x 7 days, #7, no refill, then call again with an update.

## 2012-02-02 NOTE — Telephone Encounter (Signed)
Spoke with patient and she has 2 days of Augmentin left. States she still has a little bleeding but feels the abscess is better. She was told to call when she was almost finished with Augmentin to find out if she should continue longer. Please, advise.

## 2012-02-03 MED ORDER — DOXYCYCLINE HYCLATE 100 MG PO TABS: ORAL_TABLET | ORAL | Status: DC

## 2012-02-03 NOTE — Telephone Encounter (Signed)
Rx sent. Left patient a message with Dr. Nichola Sizer instructions per patient request since she is at work at this time.

## 2012-02-09 ENCOUNTER — Encounter: Payer: Self-pay | Admitting: Obstetrics and Gynecology

## 2012-02-09 ENCOUNTER — Ambulatory Visit (INDEPENDENT_AMBULATORY_CARE_PROVIDER_SITE_OTHER): Payer: 59 | Admitting: Obstetrics and Gynecology

## 2012-02-09 VITALS — BP 110/78 | Resp 18 | Ht 63.0 in | Wt 226.0 lb

## 2012-02-09 DIAGNOSIS — Z124 Encounter for screening for malignant neoplasm of cervix: Secondary | ICD-10-CM

## 2012-02-09 DIAGNOSIS — Z0000 Encounter for general adult medical examination without abnormal findings: Secondary | ICD-10-CM

## 2012-02-09 NOTE — Progress Notes (Signed)
Regular Periods: yes Mammogram: no  Monthly Breast Ex.: no Exercise: yes  Tetanus < 10 years: yes Seatbelts: yes  NI. Bladder Functn.: yes Abuse at home: no  Daily BM's: yes Stressful Work: yes  Healthy Diet: yes Sigmoid-Colonoscopy: 2013 WNL pt gets them ever year  Calcium: no Medical problems this year: no concerns today    LAST PAP:05/30/08  Contraception: none  Mammogram:  Never   PCP: Gwendolyn Grant   PMH: 2011 hospitalized from End of Aug until March 5th 2012 due to Crohn's. Pt had partial large intestines removed, appendectomy & gallbladder removed. 2013 Perianal cysts grew to size of softball currently on Doxy to help with drainage & clear infection.   [place]: No changes   Last Bone Scan: Never     Subjective:    [name] is a 21 y.o. female, No obstetric history on file., who presents for an annual exam.     History   Social History  . Marital Status: Single    Spouse Name: N/A    Number of Children: 0  . Years of Education: 15   Occupational History  . Student    Social History Main Topics  . Smoking status: Never Smoker   . Smokeless tobacco: Never Used  . Alcohol Use: No  . Drug Use: No  . Sexually Active: Yes    Birth Control/ Protection: None   Other Topics Concern  . None   Social History Narrative   Single, lives mother.    Menstrual cycle:   LMP: Patient's last menstrual period was 01/19/2012.           Cycle: 28 days - regular  The following portions of the patient's history were reviewed and updated as appropriate: allergies, current medications, past family history, past medical history, past social history, past surgical history and problem list.  Review of Systems Pertinent items are noted in HPI. Patient has upbeat affect. Breast:Negative for breast lump,nipple discharge or nipple retraction Gastrointestinal: Negative for abdominal pain, change in bowel habits or rectal bleeding, patient has a Hx of Crohn's Disease and  attends Gastroenterologist Dr. [name]. Hx of intestinal obstruction with temporary colostomy - now reversed At present has Peri Rectal Cysts that have been excised and packed but now th skin has healed and are being tx'ed with Doxycycline Takes Humira Bi weekly. Urinary:negative   Objective:    BP 110/78  Resp 18  Ht 5' 3"  (1.6 m)  Wt 226 lb (102.513 kg)  BMI 40.03 kg/m2  LMP 01/19/2012    Weight:  Wt Readings from Last 1 Encounters:  02/09/12 226 lb (102.513 kg)          BMI: Body mass index is 40.03 kg/(m^2).  General Appearance: Alert, appropriate appearance for age. No acute distress HEENT: Grossly normal Neck / Thyroid: Supple, no masses, nodes or enlargement Lungs: clear to auscultation bilaterally Back: No CVA tenderness Breast Exam: no tenderness, no dimpling, no lumps Cardiovascular: Regular rate and rhythm. S1, S2, no murmur Gastrointestinal: Soft, non-tender, no masses or organomegaly - see not on Peri rectal cysts being tx'ed at present. Pelvic Exam: Vulva and vagina appear normal. Bimanual exam reveals normal uterus and adnexa. Rectovaginal: deferred as has rectal cysts at present - would cause discomfort. Lymphatic Exam: Non-palpable nodes in neck, clavicular, axillary, or inguinal regions Skin: no rash or abnormalities - note multiple scar son abdomen from  Surgery. Neurologic: Normal gait and speech, no tremor  Psychiatric: Alert and oriented, appropriate affect.  Wet Prep:not done Urinalysis:neg UPT: not done.   Assessment:    Normal gyn exam    Plan:  Patient advised re birth control. Contraception - Condoms at present. STD screening: declined  F/u in 1 yr or as needed.    Wyatt Mage, CNM.

## 2012-02-11 LAB — PAP IG W/ RFLX HPV ASCU

## 2012-02-13 ENCOUNTER — Telehealth: Payer: Self-pay | Admitting: *Deleted

## 2012-02-13 MED ORDER — OXYCODONE-ACETAMINOPHEN 5-325 MG PO TABS: 1.0000 | ORAL_TABLET | ORAL | Status: DC | PRN

## 2012-02-13 MED ORDER — METRONIDAZOLE 250 MG PO TABS: ORAL_TABLET | ORAL | Status: DC

## 2012-02-13 NOTE — Telephone Encounter (Signed)
Left a message for patient to call me with update.

## 2012-02-13 NOTE — Telephone Encounter (Signed)
Message copied by Hulan Saas on Fri Feb 13, 2012 10:18 AM ------      Message from: Hulan Saas      Created: Tue Feb 03, 2012  8:51 AM       Did patient call with update on conditionDB

## 2012-02-13 NOTE — Telephone Encounter (Signed)
Spoke with patient and gave her Dr. Nichola Sizer recommendation. Rx sent, Patient also asking for Percocet refill. Rx obtained and up front for pick up. Left patient a message that rx are ready

## 2012-02-13 NOTE — Telephone Encounter (Signed)
Patient returned my call about update on condition. She states she completed the antibiotics. She still has drainage and some pain. She did see her GYN MD last week and was told some of the pain may be from her ovary.

## 2012-02-13 NOTE — Telephone Encounter (Signed)
I would like to keep her on Flagyl 250 mg po bid, #60, x 4 weeks

## 2012-02-13 NOTE — Telephone Encounter (Signed)
Left a message for patient to call me. 

## 2012-02-16 ENCOUNTER — Other Ambulatory Visit: Payer: Self-pay | Admitting: Internal Medicine

## 2012-02-16 MED ORDER — PROMETHAZINE HCL 25 MG PO TABS: 25.0000 mg | ORAL_TABLET | ORAL | Status: DC | PRN

## 2012-02-16 NOTE — Telephone Encounter (Signed)
rx sent

## 2012-02-17 ENCOUNTER — Ambulatory Visit (INDEPENDENT_AMBULATORY_CARE_PROVIDER_SITE_OTHER): Payer: 59 | Admitting: Nurse Practitioner

## 2012-02-17 ENCOUNTER — Telehealth: Payer: Self-pay | Admitting: Internal Medicine

## 2012-02-17 ENCOUNTER — Encounter: Payer: Self-pay | Admitting: Nurse Practitioner

## 2012-02-17 VITALS — BP 120/70 | HR 80 | Ht 64.0 in | Wt 225.0 lb

## 2012-02-17 DIAGNOSIS — B373 Candidiasis of vulva and vagina: Secondary | ICD-10-CM

## 2012-02-17 DIAGNOSIS — R109 Unspecified abdominal pain: Secondary | ICD-10-CM | POA: Insufficient documentation

## 2012-02-17 DIAGNOSIS — K5090 Crohn's disease, unspecified, without complications: Secondary | ICD-10-CM

## 2012-02-17 DIAGNOSIS — K612 Anorectal abscess: Secondary | ICD-10-CM

## 2012-02-17 MED ORDER — FLUCONAZOLE 100 MG PO TABS: ORAL_TABLET | ORAL | Status: DC

## 2012-02-17 MED ORDER — ACYCLOVIR 800 MG PO TABS: ORAL_TABLET | ORAL | Status: DC

## 2012-02-17 NOTE — Patient Instructions (Addendum)
[facility] [address] We sent a prescription for the Zovirax to have on hold.   Call [facility] tomorrow, [phone].

## 2012-02-17 NOTE — Progress Notes (Signed)
[name] [id number] October 25, 1990   HISTORY OR PRESENT ILLNESS : [name] is a 21 year old female followed by Dr. [name] for history of Crohn's enterocolitis and duodenal Crohn's as well. She is status post ileocolectomy and diverting colostomy which was reversed in January 2012. Ernie had been on Humira and 6-MP. In May of this year she developed a perirectal abscess requiring I&D. She has taken several courses of antibiotics since then because of persistent drainage.   [name] was last seen 01/21/12 for evaluation of persistent mucoid drainage from fistula.  At that time patient was on Flagyl, we added Augmentin and scheduled repeat MRI of the pelvis which revealed persistent grade 4 transplenteric perianal fistula with associated abscess in the left ischioanal fossa. The abscess had decreased in size compared to last study. She was to see Dr. [name] in mid July but could not make it secondary to work. Patient called the office 02/02/12 and reported persistent bloody drainage, we called in 7 days of doxycycline and continuation of flagyl. Patient woke up this morning with mid abdominal pain, she was advised to come for appointment today. She has had this type pain before but it was usually more transient. No fever. She is having nausea but that has been ongoing with all the antibiotics she has been on. Bowel movements are normalizing. She is having occasional mucus and blood from fistula but overall that is better.    Current Medications, Allergies, Past Medical History, Past Surgical History, Family History and Social History were reviewed in [employer] record.   PHYSICAL EXAMINATION : General:  Pleasant, obese female in no acute distress Head: Normocephalic and atraumatic Eyes:  sclerae anicteric,conjunctive pink. Ears: Normal auditory acuity Neck: Supple, no masses.  Lungs: Clear throughout to auscultation Heart: Regular rate and rhythm; no murmurs heard Abdomen:  Soft, nondistended, mild right mid abdominal tenderness.  No masses or hepatomegaly noted. Normal bowel sounds Musculoskeletal: Symmetrical with no gross deformities  Skin: No lesions on visible extremities Extremities: No edema or deformities noted Neurological: Oriented x 4, grossly nonfocal Cervical Nodes:  No significant cervical adenopathy Psychological:  Alert and cooperative. Normal mood and affect  ASSESSMENT AND PLAN :   1. Crohn's enterocolitis, s/p resection, diverting colostomy with takedown. Her biologic therapy has been on hold secondary to perianal abscess which seems to be healing. Patient now with onset of abdominal pain today. She actually looks good today, abdominal exam is not overly concerning. Given her age if would be preferble to avoid repeat CTscan. Continue Flagyl. Hopefully pain is transient.  Patient will  call us by Friday morning with condition update. If no improvement then imaging studies will likely be necessary. Will defer to Dr. [name], patient's primary gastroenterologist as to when biologics should be resumed.  2. Vaginal yeast infection. She has been on prolonged antibiotics and feels that she may be getting a yeast infection. Patient has used Diflucan in the past but needs a refill. I will provide her with refills

## 2012-02-17 NOTE — Telephone Encounter (Signed)
Patient woke up this AM with abdominal pain in the middle of stomach. Hurts to stand up. She stayed out of work. Patient scheduled to see [name], NP at 10:30 AM today.

## 2012-02-17 NOTE — Telephone Encounter (Signed)
I agree with her seeing Nevin Bloodgood

## 2012-02-18 ENCOUNTER — Telehealth: Payer: Self-pay | Admitting: *Deleted

## 2012-02-18 DIAGNOSIS — B373 Candidiasis of vulva and vagina: Secondary | ICD-10-CM | POA: Insufficient documentation

## 2012-02-18 NOTE — Telephone Encounter (Signed)
Vianka called and I called her back.  I asked how the pain was today and she said it was better than yesterday.  Yesterday it was # 9 on the pain scale, today it is #5.  I told Alysiah I will call her tomorrow per [name] ACNP.

## 2012-02-18 NOTE — Telephone Encounter (Signed)
Patient returned your call.  Please call her back. [phone]

## 2012-02-18 NOTE — Progress Notes (Signed)
Reviewed and agree with management plan. Pricilla Riffle. Fuller Plan MD Marval Regal

## 2012-02-19 NOTE — Telephone Encounter (Signed)
Called [name] to ask how she was doing today.  She said she is improving.  She has a little pain but  Is doing better.  She thanked me for calling.

## 2012-02-25 ENCOUNTER — Ambulatory Visit (INDEPENDENT_AMBULATORY_CARE_PROVIDER_SITE_OTHER): Payer: 59 | Admitting: Internal Medicine

## 2012-02-25 ENCOUNTER — Telehealth: Payer: Self-pay | Admitting: *Deleted

## 2012-02-25 DIAGNOSIS — K5090 Crohn's disease, unspecified, without complications: Secondary | ICD-10-CM

## 2012-02-25 MED ORDER — CERTOLIZUMAB PEGOL 2 X 200 MG ~~LOC~~ KIT: 200.0000 mg | PACK | SUBCUTANEOUS | Status: DC

## 2012-02-25 MED ORDER — LORAZEPAM 1 MG PO TABS: 1.0000 mg | ORAL_TABLET | Freq: Four times a day (QID) | ORAL | Status: DC | PRN

## 2012-02-25 MED ORDER — MERCAPTOPURINE 50 MG PO TABS: 50.0000 mg | ORAL_TABLET | Freq: Every day | ORAL | Status: DC

## 2012-02-25 MED ORDER — CEPHALEXIN 250 MG PO CAPS: 250.0000 mg | ORAL_CAPSULE | Freq: Four times a day (QID) | ORAL | Status: AC

## 2012-02-25 MED ORDER — OXYCODONE-ACETAMINOPHEN 5-325 MG PO TABS: 1.0000 | ORAL_TABLET | ORAL | Status: DC | PRN

## 2012-02-25 NOTE — Telephone Encounter (Signed)
OK to start 6MP 50 mg/day, Cimzia induction regimen and Keflex 250 mg po qid. X 10 days #40. Recheck CBC,C-met next week when she is free to come.

## 2012-02-25 NOTE — Telephone Encounter (Signed)
I have spoken to patient and have advised that she start back on 6 mp 50 mg daily, start Cimzia induction regimen, start Keflex qid x 10 days and have blood work completed next week. She verbalizes understanding. She has also been given written scripts for Ativan and Oxycodone.

## 2012-02-25 NOTE — Telephone Encounter (Signed)
Patient came for TB test today. She states that she continues to have right middle abdominal pain as well as nausea and increasing diarrhea. Saturday, patient noticed blood in stool. She also continues to notice purulent discharge from rectum. She has had decreased appetite. She has not taken her temp. Patient states that she has been taking promethazine as needed as well as pain medication. She states that she needs additional pain medication (last oxycodone given #30 02/13/12). She has not been taking any medication for crohn's disease since April (no 55m, no Humira). Dr BOlevia Perches can patient have refills on oxycodone and ativan now and do you have any further recommendations?

## 2012-02-27 LAB — TB SKIN TEST: TB Skin Test: NEGATIVE

## 2012-03-02 ENCOUNTER — Encounter (HOSPITAL_COMMUNITY): Payer: Self-pay | Admitting: *Deleted

## 2012-03-02 ENCOUNTER — Emergency Department (HOSPITAL_COMMUNITY): Payer: 59

## 2012-03-02 ENCOUNTER — Inpatient Hospital Stay (HOSPITAL_COMMUNITY)
Admission: EM | Admit: 2012-03-02 | Discharge: 2012-03-05 | DRG: 389 | Disposition: A | Discharge status: Home / Self Care | Payer: 59 | Attending: Family Medicine | Admitting: Family Medicine

## 2012-03-02 DIAGNOSIS — J45909 Unspecified asthma, uncomplicated: Secondary | ICD-10-CM | POA: Diagnosis not present

## 2012-03-02 DIAGNOSIS — K612 Anorectal abscess: Secondary | ICD-10-CM

## 2012-03-02 DIAGNOSIS — F419 Anxiety disorder, unspecified: Secondary | ICD-10-CM | POA: Diagnosis present

## 2012-03-02 DIAGNOSIS — B373 Candidiasis of vulva and vagina: Secondary | ICD-10-CM

## 2012-03-02 DIAGNOSIS — A6000 Herpesviral infection of urogenital system, unspecified: Secondary | ICD-10-CM

## 2012-03-02 DIAGNOSIS — I82C19 Acute embolism and thrombosis of unspecified internal jugular vein: Secondary | ICD-10-CM

## 2012-03-02 DIAGNOSIS — R110 Nausea: Secondary | ICD-10-CM

## 2012-03-02 DIAGNOSIS — E876 Hypokalemia: Secondary | ICD-10-CM

## 2012-03-02 DIAGNOSIS — Z6838 Body mass index (BMI) 38.0-38.9, adult: Secondary | ICD-10-CM

## 2012-03-02 DIAGNOSIS — K219 Gastro-esophageal reflux disease without esophagitis: Secondary | ICD-10-CM | POA: Diagnosis present

## 2012-03-02 DIAGNOSIS — K5090 Crohn's disease, unspecified, without complications: Secondary | ICD-10-CM | POA: Diagnosis present

## 2012-03-02 DIAGNOSIS — N390 Urinary tract infection, site not specified: Secondary | ICD-10-CM

## 2012-03-02 DIAGNOSIS — B009 Herpesviral infection, unspecified: Secondary | ICD-10-CM | POA: Diagnosis present

## 2012-03-02 DIAGNOSIS — J019 Acute sinusitis, unspecified: Secondary | ICD-10-CM

## 2012-03-02 DIAGNOSIS — K56609 Unspecified intestinal obstruction, unspecified as to partial versus complete obstruction: Principal | ICD-10-CM | POA: Diagnosis present

## 2012-03-02 DIAGNOSIS — J209 Acute bronchitis, unspecified: Secondary | ICD-10-CM

## 2012-03-02 DIAGNOSIS — K263 Acute duodenal ulcer without hemorrhage or perforation: Secondary | ICD-10-CM

## 2012-03-02 DIAGNOSIS — R112 Nausea with vomiting, unspecified: Secondary | ICD-10-CM | POA: Diagnosis present

## 2012-03-02 DIAGNOSIS — R109 Unspecified abdominal pain: Secondary | ICD-10-CM | POA: Diagnosis present

## 2012-03-02 DIAGNOSIS — Z79899 Other long term (current) drug therapy: Secondary | ICD-10-CM

## 2012-03-02 DIAGNOSIS — D509 Iron deficiency anemia, unspecified: Secondary | ICD-10-CM | POA: Diagnosis present

## 2012-03-02 DIAGNOSIS — Z86718 Personal history of other venous thrombosis and embolism: Secondary | ICD-10-CM

## 2012-03-02 DIAGNOSIS — L0390 Cellulitis, unspecified: Secondary | ICD-10-CM

## 2012-03-02 DIAGNOSIS — N920 Excessive and frequent menstruation with regular cycle: Secondary | ICD-10-CM

## 2012-03-02 DIAGNOSIS — F411 Generalized anxiety disorder: Secondary | ICD-10-CM | POA: Diagnosis present

## 2012-03-02 DIAGNOSIS — N179 Acute kidney failure, unspecified: Secondary | ICD-10-CM

## 2012-03-02 DIAGNOSIS — G8929 Other chronic pain: Secondary | ICD-10-CM | POA: Diagnosis present

## 2012-03-02 LAB — PHOSPHORUS: Phosphorus: 3.2 mg/dL (ref 2.3–4.6)

## 2012-03-02 LAB — CBC WITH DIFFERENTIAL/PLATELET
Basophils Absolute: 0 10*3/uL (ref 0.0–0.1)
Basophils Relative: 0 % (ref 0–1)
Eosinophils Absolute: 0.1 10*3/uL (ref 0.0–0.7)
MCH: 25.6 pg — ABNORMAL LOW (ref 26.0–34.0)
MCHC: 31.2 g/dL (ref 30.0–36.0)
Monocytes Relative: 4 % (ref 3–12)
Neutro Abs: 7.9 10*3/uL — ABNORMAL HIGH (ref 1.7–7.7)
Neutrophils Relative %: 80 % — ABNORMAL HIGH (ref 43–77)
Platelets: 498 10*3/uL — ABNORMAL HIGH (ref 150–400)
RDW: 15.6 % — ABNORMAL HIGH (ref 11.5–15.5)

## 2012-03-02 LAB — PREGNANCY, URINE: Preg Test, Ur: NEGATIVE

## 2012-03-02 LAB — COMPREHENSIVE METABOLIC PANEL
AST: 15 U/L (ref 0–37)
Albumin: 3.8 g/dL (ref 3.5–5.2)
Alkaline Phosphatase: 53 U/L (ref 39–117)
BUN: 7 mg/dL (ref 6–23)
Potassium: 3.5 mEq/L (ref 3.5–5.1)
Sodium: 132 mEq/L — ABNORMAL LOW (ref 135–145)
Total Protein: 7.9 g/dL (ref 6.0–8.3)

## 2012-03-02 LAB — URINALYSIS, ROUTINE W REFLEX MICROSCOPIC
Glucose, UA: NEGATIVE mg/dL
Hgb urine dipstick: NEGATIVE
Ketones, ur: NEGATIVE mg/dL
Leukocytes, UA: NEGATIVE
pH: 7 (ref 5.0–8.0)

## 2012-03-02 MED ORDER — ONDANSETRON 8 MG PO TBDP
8.0000 mg | ORAL_TABLET | Freq: Once | ORAL | Status: AC
  Administered 2012-03-02: 8 mg via ORAL

## 2012-03-02 MED ORDER — HEPARIN SODIUM (PORCINE) 5000 UNIT/ML IJ SOLN
5000.0000 [IU] | Freq: Three times a day (TID) | INTRAMUSCULAR | Status: DC
  Administered 2012-03-02 – 2012-03-05 (×10): 5000 [IU] via SUBCUTANEOUS
  Filled 2012-03-02 (×12): qty 1

## 2012-03-02 MED ORDER — PANTOPRAZOLE SODIUM 40 MG IV SOLR
40.0000 mg | Freq: Two times a day (BID) | INTRAVENOUS | Status: DC
  Administered 2012-03-02 – 2012-03-03 (×3): 40 mg via INTRAVENOUS
  Filled 2012-03-02 (×4): qty 40

## 2012-03-02 MED ORDER — ONDANSETRON 8 MG PO TBDP
ORAL_TABLET | ORAL | Status: AC
  Administered 2012-03-02: 8 mg via ORAL
  Filled 2012-03-02: qty 1

## 2012-03-02 MED ORDER — PROMETHAZINE HCL 25 MG/ML IJ SOLN
12.5000 mg | Freq: Once | INTRAMUSCULAR | Status: AC
  Administered 2012-03-02: 12.5 mg via INTRAVENOUS
  Filled 2012-03-02: qty 1

## 2012-03-02 MED ORDER — ONDANSETRON HCL 4 MG/2ML IJ SOLN
4.0000 mg | Freq: Four times a day (QID) | INTRAMUSCULAR | Status: DC | PRN
  Administered 2012-03-02 – 2012-03-03 (×2): 4 mg via INTRAVENOUS
  Filled 2012-03-02 (×2): qty 2

## 2012-03-02 MED ORDER — THIAMINE HCL 100 MG/ML IJ SOLN
Freq: Once | INTRAVENOUS | Status: DC
  Filled 2012-03-02: qty 1000

## 2012-03-02 MED ORDER — IOHEXOL 300 MG/ML  SOLN: 100.0000 mL | Freq: Once | INTRAMUSCULAR | Status: DC | PRN

## 2012-03-02 MED ORDER — ACETAMINOPHEN 650 MG RE SUPP: 650.0000 mg | Freq: Four times a day (QID) | RECTAL | Status: DC | PRN

## 2012-03-02 MED ORDER — ONDANSETRON HCL 4 MG/2ML IJ SOLN: 4.0000 mg | Freq: Once | INTRAMUSCULAR | Status: DC

## 2012-03-02 MED ORDER — SODIUM CHLORIDE 0.9 % IV SOLN: INTRAVENOUS | Status: AC

## 2012-03-02 MED ORDER — MORPHINE SULFATE 2 MG/ML IJ SOLN
2.0000 mg | INTRAMUSCULAR | Status: DC | PRN
  Administered 2012-03-02 (×4): 2 mg via INTRAVENOUS
  Filled 2012-03-02 (×4): qty 1

## 2012-03-02 MED ORDER — LORAZEPAM 2 MG/ML IJ SOLN
1.0000 mg | Freq: Three times a day (TID) | INTRAMUSCULAR | Status: DC | PRN
  Administered 2012-03-02 – 2012-03-03 (×3): 1 mg via INTRAVENOUS
  Filled 2012-03-02 (×3): qty 1

## 2012-03-02 MED ORDER — ONDANSETRON HCL 4 MG PO TABS
4.0000 mg | ORAL_TABLET | Freq: Four times a day (QID) | ORAL | Status: DC | PRN
  Administered 2012-03-04 – 2012-03-05 (×2): 4 mg via ORAL
  Filled 2012-03-02 (×2): qty 1

## 2012-03-02 MED ORDER — SODIUM CHLORIDE 0.9 % IV BOLUS (SEPSIS)
1000.0000 mL | Freq: Once | INTRAVENOUS | Status: AC
  Administered 2012-03-02: 1000 mL via INTRAVENOUS

## 2012-03-02 MED ORDER — HYDROMORPHONE HCL PF 1 MG/ML IJ SOLN
1.0000 mg | Freq: Once | INTRAMUSCULAR | Status: AC
  Administered 2012-03-02: 1 mg via INTRAVENOUS
  Filled 2012-03-02: qty 1

## 2012-03-02 MED ORDER — ALBUTEROL SULFATE (5 MG/ML) 0.5% IN NEBU: 2.5000 mg | INHALATION_SOLUTION | Freq: Four times a day (QID) | RESPIRATORY_TRACT | Status: DC | PRN

## 2012-03-02 MED ORDER — SODIUM CHLORIDE 0.9 % IV SOLN
INTRAVENOUS | Status: AC
  Administered 2012-03-02: 1000 mL via INTRAVENOUS

## 2012-03-02 MED ORDER — PROMETHAZINE HCL 25 MG/ML IJ SOLN
25.0000 mg | Freq: Once | INTRAMUSCULAR | Status: AC
  Administered 2012-03-02: 25 mg via INTRAVENOUS
  Filled 2012-03-02: qty 1

## 2012-03-02 MED ORDER — ACETAMINOPHEN 325 MG PO TABS: 650.0000 mg | ORAL_TABLET | Freq: Four times a day (QID) | ORAL | Status: DC | PRN

## 2012-03-02 NOTE — Consult Note (Signed)
General Surgery [facility] Patient seen and examined.  Patient known to our practice - Drs. Wake[place] and [employer].  Recently re-started Crohn's medical therapy.  Will follow with you.  No role for acute surgical intervention for partial SBO. [name], MD, [facility] Office: [phone]

## 2012-03-02 NOTE — ED Notes (Signed)
Pt states is is not nauseous, and does not want the NGT at this time.

## 2012-03-02 NOTE — Progress Notes (Signed)
Pt refused NGT. RN educated pt on the risk of refusing MD  Order, with regards to care. RN assessed patient and Will continue to monitor pt for S/S.

## 2012-03-02 NOTE — ED Notes (Signed)
Patient transported to CT 

## 2012-03-02 NOTE — ED Notes (Signed)
Pt c/o abd pain since 2 pm; vomiting/nausea; states feels like Crohns

## 2012-03-02 NOTE — ED Notes (Signed)
Patient transported to X-ray 

## 2012-03-02 NOTE — ED Notes (Signed)
Per admitting MD, hold putting NGT in at this time, unless pt becomes nauseous or starts vomiting.

## 2012-03-02 NOTE — Consult Note (Signed)
[name] [birth year]  [id number].   Requesting MD: Dr. Barton Dubois Chief Complaint/Reason for Consult: SBO HPI: This is a 21 yo female with a history of Crohn's disease with a complex surgical history.  She states that 2 weeks ago she started developing abdominal pain and saw Dr. [name] who recommended a CT scan, but the patient wanted to hold off at the time.  Last week, she was started on mercaptopurine, Keflex, and certolizumab injection assuming a crohn's flare for the cause of her symptoms.  The patient had stopped taking crohn's medications, Humire, because her insurance stopped covering it.    Yesterday the patient's pain worsened and she developed nausea and vomiting.  She presented to the [facility].  She is still passing flatus and had a BM yesterday afternoon.  A CT scan was done which revealed a PSBO at the site of her anastomosis suggesting a narrowing or stricture as the cause of her PSBO.  The patient is being admitted by the hospitalist and we have been asked to consult on the patient.  Review of Systems: Please see HPI, otherwise all other systems have been reviewed and are negative.  She says her perianal abscess and wound have now healed.  Family History  Problem Relation Age of Onset  . Diabetes Maternal Uncle   . Breast cancer Maternal Grandmother   . Colon cancer Neg Hx   . Diabetes Cousin     Past Medical History  Diagnosis Date  . 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 Anxiety   . Candida esophagitis   . Ovarian cyst, left   . Anemia     iron def  . DVT (deep venous thrombosis) 08/2010    Left IJ (postop) anticoag x 3 mo  . SVT (supraventricular tachycardia)   . Depression   . Small bowel obstruction   . Acute renal failure   . MRSA (methicillin resistant Staphylococcus aureus)     multiple abscesses    Past Surgical History  Procedure Date  . Cholecystectomy 2009  . Closure of ileostomy/loop 08/2010  .  Colectomy with diverting loop ileostomy   . Appendectomy   . Esophagogastroduodenoscopy 11/11/2011    Procedure: ESOPHAGOGASTRODUODENOSCOPY (EGD);  Surgeon: Lafayette Dragon, MD;  Location: [facility];  Service: Endoscopy;  Laterality: N/A;  . Examination under anesthesia 11/12/2011    Procedure: EXAM UNDER ANESTHESIA;  Surgeon: Rolm Bookbinder, MD;  Location: [facility];  Service: General;  Laterality: N/A;  . Proctoscopy 11/12/2011    Procedure: PROCTOSCOPY;  Surgeon: Rolm Bookbinder, MD;  Location: [facility];  Service: General;  Laterality: N/A;  . Incision and drainage perirectal abscess 11/12/2011    Procedure: IRRIGATION AND DEBRIDEMENT PERIRECTAL ABSCESS;  Surgeon: Rolm Bookbinder, MD;  Location: [facility];  Service: General;  Laterality: N/A;  . Incision and drainage perirectal abscess 12/11/2011    Procedure: IRRIGATION AND DEBRIDEMENT PERIRECTAL ABSCESS;  Surgeon: [name], MD;  Location: [facility];  Service: General;  Laterality: N/A;    Social History:  reports that she has never smoked. She has never used smokeless tobacco. She reports that she does not drink alcohol or use illicit drugs.  Allergies:  Allergies  Allergen Reactions  . Iron Dextran Anaphylaxis  . Iron     REACTION: IV iron dextran     (Not in a hospital admission)  Blood pressure 109/65, pulse 71, temperature 98.4 F (36.9 C), temperature source Oral, resp. rate 20, height  5' 4"  (1.626 m), last menstrual period 02/17/2012, SpO2 97.00%. Physical Exam: General: pleasant, obese black female who is laying in bed in NAD HEENT: head is normocephalic, atraumatic.  Sclera are noninjected.  PERRL.  Ears and nose without any masses or lesions.  Mouth is pink and moist Heart: regular, rate, and rhythm.  Normal s1,s2. No obvious murmurs, gallops, or rubs noted.  Palpable radial and pedal pulses bilaterally Lungs: CTAB, no wheezes, rhonchi, or rales noted.  Respiratory effort nonlabored Abd: soft, minimal tenderness in LUQ, ND,  +BS, no masses, hernias, or organomegaly.  She has several scars noted from her prior surgeries. MS: all 4 extremities are symmetrical with no cyanosis, clubbing, or edema. Skin: warm and dry with no masses, lesions, or rashes Psych: A&Ox3 with an appropriate affect.    Results for orders placed during the hospital encounter of 03/02/12 (from the past 48 hour(s))  URINALYSIS, ROUTINE W REFLEX MICROSCOPIC     Status: Abnormal   Collection Time   03/02/12  3:05 AM      Component Value Range Comment   Color, Urine YELLOW  YELLOW    APPearance CLOUDY (*) CLEAR    Specific Gravity, Urine 1.022  1.005 - 1.030    pH 7.0  5.0 - 8.0    Glucose, UA NEGATIVE  NEGATIVE mg/dL    Hgb urine dipstick NEGATIVE  NEGATIVE    Bilirubin Urine NEGATIVE  NEGATIVE    Ketones, ur NEGATIVE  NEGATIVE mg/dL    Protein, ur NEGATIVE  NEGATIVE mg/dL    Urobilinogen, UA 0.2  0.0 - 1.0 mg/dL    Nitrite NEGATIVE  NEGATIVE    Leukocytes, UA NEGATIVE  NEGATIVE MICROSCOPIC NOT DONE ON URINES WITH NEGATIVE PROTEIN, BLOOD, LEUKOCYTES, NITRITE, OR GLUCOSE <1000 mg/dL.  PREGNANCY, URINE     Status: Normal   Collection Time   03/02/12  3:05 AM      Component Value Range Comment   Preg Test, Ur NEGATIVE  NEGATIVE   CBC WITH DIFFERENTIAL     Status: Abnormal   Collection Time   03/02/12  3:29 AM      Component Value Range Comment   WBC 9.9  4.0 - 10.5 K/uL    RBC 4.65  3.87 - 5.11 MIL/uL    Hemoglobin 11.9 (*) 12.0 - 15.0 g/dL    HCT 38.2  36.0 - 46.0 %    MCV 82.2  78.0 - 100.0 fL    MCH 25.6 (*) 26.0 - 34.0 pg    MCHC 31.2  30.0 - 36.0 g/dL    RDW 15.6 (*) 11.5 - 15.5 %    Platelets 498 (*) 150 - 400 K/uL    Neutrophils Relative 80 (*) 43 - 77 %    Neutro Abs 7.9 (*) 1.7 - 7.7 K/uL    Lymphocytes Relative 16  12 - 46 %    Lymphs Abs 1.6  0.7 - 4.0 K/uL    Monocytes Relative 4  3 - 12 %    Monocytes Absolute 0.4  0.1 - 1.0 K/uL    Eosinophils Relative 1  0 - 5 %    Eosinophils Absolute 0.1  0.0 - 0.7 K/uL     Basophils Relative 0  0 - 1 %    Basophils Absolute 0.0  0.0 - 0.1 K/uL   COMPREHENSIVE METABOLIC PANEL     Status: Abnormal   Collection Time   03/02/12  3:29 AM      Component Value Range Comment  Sodium 132 (*) 135 - 145 mEq/L    Potassium 3.5  3.5 - 5.1 mEq/L    Chloride 98  96 - 112 mEq/L    CO2 26  19 - 32 mEq/L    Glucose, Bld 132 (*) 70 - 99 mg/dL    BUN 7  6 - 23 mg/dL    Creatinine, Ser 0.57  0.50 - 1.10 mg/dL    Calcium 9.8  8.4 - 10.5 mg/dL    Total Protein 7.9  6.0 - 8.3 g/dL    Albumin 3.8  3.5 - 5.2 g/dL    AST 15  0 - 37 U/L    ALT 12  0 - 35 U/L    Alkaline Phosphatase 53  39 - 117 U/L    Total Bilirubin 0.3  0.3 - 1.2 mg/dL    GFR calc non Af Amer >90  >90 mL/min    GFR calc Af Amer >90  >90 mL/min    Ct Abdomen Pelvis W Contrast  03/02/2012  *RADIOLOGY REPORT*  Clinical Data: Abdominal pain.  Nausea and vomiting.  History of Crohn's disease.  Perianal fistula.  Prior small bowel obstructions.  CT ABDOMEN AND PELVIS WITH CONTRAST  Technique:  Multidetector CT imaging of the abdomen and pelvis was performed following the standard protocol during bolus administration of intravenous contrast.  Contrast:  100 ml Omnipaque-300  Comparison: Multiple exams, including 01/22/2012 and 11/08/2011 as well as acute abdomen series of 03/02/2012  Findings: The liver, spleen, pancreas, and adrenal glands appear unremarkable.  The gallbladder is surgically absent. The kidneys appear unremarkable, as do the proximal ureters.  Oral contrast medium is present in the stomach and small bowel. There is a grouping of dilated small bowel loops centrally in the abdomen apparently extending to a small bowel and anastomotic staple line just to the left of midline on image 59 of series 2, with multiple air-fluid levels in the involved loops.  Abnormal surrounding mesenteric edema and mild ascites noted, as on the prior exam.  The distal small bowel loops are not dilated.  Pelvic ascites noted  particularly in the cul-de-sac.  Hypodense lesion of the left ovary measures 1.9 cm.  Uterus appears unremarkable.  Stable scarring along the left rectus abdominus muscle noted at the level of the umbilicus.  The patient has a history of perianal fistula which is well shown on today's CT scan, and is better characterized by MRI.  IMPRESSION:  1.  Central abdominal loops of small bowel with apparent transition in the vicinity of the small bowel anastomotic staple lines of the left of midline.  The appearance is compatible with small-bowel obstruction.  As on the prior CT exam, there is surrounding mesenteric edema as well as a mildly complex ascites. 2.  Hypodense left ovarian lesion, possibly a cyst, measuring 1.7 cm in diameter.  The three stable scarring and mild thickening of the left rectus abdominus muscle at the level of the umbilicus. 3.  The patient's known perianal fistula is not included/readily apparent on today's exam, and is typically better characterized by dedicated protocol MRI.   Original Report Authenticated By: Carron Curie, M.D.    Dg Abd Acute W/chest  03/02/2012  *RADIOLOGY REPORT*  Clinical Data: Sudden onset upper abdominal pain and nausea.  ACUTE ABDOMEN SERIES (ABDOMEN 2 VIEW & CHEST 1 VIEW)  Comparison: Chest 06/27/2011, abdomen 11/10/2011.  Findings: Shallow inspiration.  Normal heart size and pulmonary vascularity.  No focal airspace consolidation in the lungs.  No blunting of costophrenic angles.  No pneumothorax.  Mediastinal contours appear intact.  No significant change since previous chest radiograph.  Dilated gas-filled mid abdominal small bowel loops with multiple air-fluid levels consistent with small bowel obstruction.  A paucity of gas in the colon without distension.  Surgical clips in the right upper quadrant.  No free intra-abdominal air.  No radiopaque stones.  Postoperative changes in the right lower quadrant.  Visualized bones appear intact.  IMPRESSION: No  evidence of active pulmonary disease.  Gas filled dilated small bowel with air-fluid levels consistent with obstruction.   Original Report Authenticated By: Neale Burly, M.D.        Assessment/Plan 1. PSBO, likely secondary to stricture at anastomosis 2. H/O crohn's disease, no active disease seen on CT Patient Active Problem List  Diagnosis  . GENITAL HERPES  . ANEMIA, IRON DEFICIENCY, CHRONIC  . ACUT DUOD ULCER W/O MENTION HEMORR PERF/OBST  . CROHN'S DISEASE  . UTI'S, RECURRENT  . MENORRHAGIA  . 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 NAUSEA, CHRONIC  . Acute sinus infection  . Acute bronchitis  . SBO (small bowel obstruction)  . Hypokalemia  . Anxiety  . Cellulitis  . Acute renal failure  . Abscess of anal and rectal regions  . Abdominal pain  . Vaginal candida  . Nausea and vomiting  . Unspecified asthma  . GERD (gastroesophageal reflux disease)   Plan: 1. Right now the patient is not having nausea or vomiting.  I think we can hold off on an NGT for now; however, if the patient begins to develop nausea or emesis, I think this will need to be placed.  She agrees.  I will review her CT scan with Dr. Harlow Asa.  It appears this is a PSBO at the level of her SB anastomosis and not secondary to active crohn's disease.  Hopefully, bowel rest will allow this to resolve without surgical intervention; however, if it does not, then she may need an anastomotic revision.  She is currently still passing flatus and there is air in her colon on her CT scan.  We will follow up this imaging with a 2 view abdominal x-ray in the morning to follow her progress.  Thank you for this consultation.  We will follow with you.  Kavir Savoca E 03/02/2012, 10:28 AM

## 2012-03-02 NOTE — ED Provider Notes (Signed)
Medical screening examination/treatment/procedure(s) were performed by non-physician practitioner and as supervising physician I was immediately available for consultation/collaboration.   Hoy Morn, MD 03/02/12 [phone]

## 2012-03-02 NOTE — H&P (Signed)
[facility] History and Physical  DIM MEISINGER ZES:[id number] DOB: 1990/10/07 DOA: 03/02/2012  Referring physician: Dr. Venora Maples PCP: Gwendolyn Grant, MD   Chief Complaint: abdominal pain, nausea/vomiting  HPI:  21 year old female with extensive history of Crohn disease, prior history of small bowel obstruction, history of DVT, perirectal abscess and chronic anemia; came to the hospital complaining of approximately 2 weeks of abdominal pain gradually worsening but at this time. Patient reports being evaluated by her gastroenterologist about 5 days ago and he started on as needed antiemetics and pain medication. Despite this approach patient reports that approximately one day prior to admission she develops increased abdominal pain with associated intractable nausea vomiting. Patient denies any hematemesis, hematochezia, melena, constipation, diarrhea, chest pain or shortness of breath. In the emergency department initial workup demonstrated a chest x-ray with findings consistent for SBO and also had an abnormal CT of her abdomen that confirmed the diagnosis of SBO with transition zone. Patient described her pain to be sharp and 10-10 in severity the patient doesn't radiates and is not associated with any other symptoms. Food intake makes it worse.  Review of Systems:  Negative except as mentioned on history of present illness.  Past Medical History  Diagnosis Date  . 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 Anxiety   . Candida esophagitis   . Ovarian cyst, left   . Anemia     iron def  . DVT (deep venous thrombosis) 08/2010    Left IJ (postop) anticoag x 3 mo  . SVT (supraventricular tachycardia)   . Depression   . Small bowel obstruction   . Acute renal failure   . MRSA (methicillin resistant Staphylococcus aureus)     multiple abscesses   Past Surgical History  Procedure Date  . Cholecystectomy 2009  . Closure of  ileostomy/loop 08/2010  . Colectomy with diverting loop ileostomy   . Appendectomy   . Esophagogastroduodenoscopy 11/11/2011    Procedure: ESOPHAGOGASTRODUODENOSCOPY (EGD);  Surgeon: Lafayette Dragon, MD;  Location: [facility];  Service: Endoscopy;  Laterality: N/A;  . Examination under anesthesia 11/12/2011    Procedure: EXAM UNDER ANESTHESIA;  Surgeon: Rolm Bookbinder, MD;  Location: [facility];  Service: General;  Laterality: N/A;  . Proctoscopy 11/12/2011    Procedure: PROCTOSCOPY;  Surgeon: Rolm Bookbinder, MD;  Location: [facility];  Service: General;  Laterality: N/A;  . Incision and drainage perirectal abscess 11/12/2011    Procedure: IRRIGATION AND DEBRIDEMENT PERIRECTAL ABSCESS;  Surgeon: Rolm Bookbinder, MD;  Location: [facility];  Service: General;  Laterality: N/A;  . Incision and drainage perirectal abscess 12/11/2011    Procedure: IRRIGATION AND DEBRIDEMENT PERIRECTAL ABSCESS;  Surgeon: [name], MD;  Location: [facility];  Service: General;  Laterality: N/A;   Social History:  reports that she has never smoked. She has never used smokeless tobacco. She reports that she does not drink alcohol or use illicit drugs. patient came from home and was able to perform all her activities of daily living without assistance.   Allergies  Allergen Reactions  . Iron Dextran Anaphylaxis  . Iron     REACTION: IV iron dextran    Family History  Problem Relation Age of Onset  . Diabetes Maternal Uncle   . Breast cancer Maternal Grandmother   . Colon cancer Neg Hx   . Diabetes Cousin    Prior to Admission medications   Medication Sig Start Date End Date Taking? Authorizing Provider  acyclovir (ZOVIRAX) 800 MG tablet Take 1 tablet by mouth three times daily x 1 week 02/17/12  Yes Willia Craze, NP  cephALEXin (KEFLEX) 250 MG capsule Take 1 capsule (250 mg total) by mouth 4 (four) times daily. 02/25/12 03/06/12 Yes Lafayette Dragon, MD  Certolizumab Pegol 2 X 200 MG KIT Inject 200 mg into the skin every  14 (fourteen) days. 02/25/12  Yes Lafayette Dragon, MD  esomeprazole (NEXIUM) 40 MG capsule Take 80 mg by mouth daily before breakfast. TAKE 2 CAP 11/19/11  Yes Lafayette Dragon, MD  fluconazole (DIFLUCAN) 100 MG tablet Take 1 tablet by mouth once daily x 3 days. Repeat if infection reccurs. 02/17/12  Yes Willia Craze, NP  LORazepam (ATIVAN) 1 MG tablet Take 1 tablet (1 mg total) by mouth every 6 (six) hours as needed. ANXIETY 02/25/12  Yes Lafayette Dragon, MD  mercaptopurine (PURINETHOL) 50 MG tablet Take 1 tablet (50 mg total) by mouth daily. 02/25/12  Yes Lafayette Dragon, MD  oxyCODONE-acetaminophen (PERCOCET/ROXICET) 5-325 MG per tablet Take 1 tablet by mouth every 4 (four) hours as needed. PAIN 02/25/12  Yes Lafayette Dragon, MD  promethazine (PHENERGAN) 25 MG tablet Take 1 tablet (25 mg total) by mouth every 4 (four) hours as needed for nausea. Take 1 tablet by mouth every 4-6 hours as needed for nausea 02/16/12  Yes Lafayette Dragon, MD   Physical Exam: Filed Vitals:   03/02/12 0221 03/02/12 0619  BP: 132/97 144/99  Pulse: 91   Temp: 98.7 F (37.1 C) 98.2 F (36.8 C)  TempSrc: Oral Oral  Resp: 20 18  Height: 5' 4"  (1.626 m)   SpO2: 98%      General:  No acute distress; currently comfortable and after receiving medications in the ED pretty much asymptomatic.  Eyes: no icterus or nystagmus appreciated on exam; PERRLA, extraocular muscles intact.  ENT: dry mucous membranes, no erythema or exudate inside her mouth, no discharges out of her ears or nostrils  Neck: supple, no bruits or thyromegaly  Cardiovascular: mild tachycardia, no rubs, no murmurs or gallops, S1 and S2 appreciated.  Respiratory: good air movement bilaterally, no wheezing, no crackles, no rhonchi.  Abdomen: soft, multiple scars from previous surgeries appreciated on her abdomen; mild tenderness to deep palpation in her epigastric and mid abdomen area; no guarding, positive high pitch bowel sounds.  Skin: no rashes or petechiae  appreciated, multiple scars as described on her abdomen from previous surgeries all of them pretty well healed.  Musculoskeletal: full range of motion. No joint swelling or erythema.  Psychiatric: stable and appropriate for current situation.  Neurologic: cranial nerve intact, muscle strength 5 out of 5 bilaterally and symmetrically, no focal motor or sensory deficit appreciated. Patient was alert, awake and oriented x3.  Labs on Admission:  Basic Metabolic Panel:  Lab 97/67/34 0329  NA 132*  K 3.5  CL 98  CO2 26  GLUCOSE 132*  BUN 7  CREATININE 0.57  CALCIUM 9.8  MG --  PHOS --   Liver Function Tests:  Lab 03/02/12 0329  AST 15  ALT 12  ALKPHOS 53  BILITOT 0.3  PROT 7.9  ALBUMIN 3.8   CBC:  Lab 03/02/12 0329  WBC 9.9  NEUTROABS 7.9*  HGB 11.9*  HCT 38.2  MCV 82.2  PLT 498*    Radiological Exams on Admission: Ct Abdomen Pelvis W Contrast  03/02/2012  *RADIOLOGY REPORT*  Clinical Data: Abdominal pain.  Nausea and vomiting.  History of Crohn's disease.  Perianal fistula.  Prior small bowel obstructions.  CT ABDOMEN AND PELVIS WITH CONTRAST  Technique:  Multidetector CT imaging of the abdomen and pelvis was performed following the standard protocol during bolus administration of intravenous contrast.  Contrast:  100 ml Omnipaque-300  Comparison: Multiple exams, including 01/22/2012 and 11/08/2011 as well as acute abdomen series of 03/02/2012  Findings: The liver, spleen, pancreas, and adrenal glands appear unremarkable.  The gallbladder is surgically absent. The kidneys appear unremarkable, as do the proximal ureters.  Oral contrast medium is present in the stomach and small bowel. There is a grouping of dilated small bowel loops centrally in the abdomen apparently extending to a small bowel and anastomotic staple line just to the left of midline on image 59 of series 2, with multiple air-fluid levels in the involved loops.  Abnormal surrounding mesenteric edema and mild  ascites noted, as on the prior exam.  The distal small bowel loops are not dilated.  Pelvic ascites noted particularly in the cul-de-sac.  Hypodense lesion of the left ovary measures 1.9 cm.  Uterus appears unremarkable.  Stable scarring along the left rectus abdominus muscle noted at the level of the umbilicus.  The patient has a history of perianal fistula which is well shown on today's CT scan, and is better characterized by MRI.  IMPRESSION:  1.  Central abdominal loops of small bowel with apparent transition in the vicinity of the small bowel anastomotic staple lines of the left of midline.  The appearance is compatible with small-bowel obstruction.  As on the prior CT exam, there is surrounding mesenteric edema as well as a mildly complex ascites. 2.  Hypodense left ovarian lesion, possibly a cyst, measuring 1.7 cm in diameter.  The three stable scarring and mild thickening of the left rectus abdominus muscle at the level of the umbilicus. 3.  The patient's known perianal fistula is not included/readily apparent on today's exam, and is typically better characterized by dedicated protocol MRI.   Original Report Authenticated By: Carron Curie, M.D.    Dg Abd Acute W/chest  03/02/2012  *RADIOLOGY REPORT*  Clinical Data: Sudden onset upper abdominal pain and nausea.  ACUTE ABDOMEN SERIES (ABDOMEN 2 VIEW & CHEST 1 VIEW)  Comparison: Chest 06/27/2011, abdomen 11/10/2011.  Findings: Shallow inspiration.  Normal heart size and pulmonary vascularity.  No focal airspace consolidation in the lungs.  No blunting of costophrenic angles.  No pneumothorax.  Mediastinal contours appear intact.  No significant change since previous chest radiograph.  Dilated gas-filled mid abdominal small bowel loops with multiple air-fluid levels consistent with small bowel obstruction.  A paucity of gas in the colon without distension.  Surgical clips in the right upper quadrant.  No free intra-abdominal air.  No radiopaque  stones.  Postoperative changes in the right lower quadrant.  Visualized bones appear intact.  IMPRESSION: No evidence of active pulmonary disease.  Gas filled dilated small bowel with air-fluid levels consistent with obstruction.   Original Report Authenticated By: Neale Burly, M.D.     Assessment/Plan 1-Abdominal pain: Patient abdominal pain most likely secondary to SBO; high concerns for akinesia and given past medical history of multiple surgery as a cause for SBO. Patient also with gastroesophageal reflux disease and history of gastritis, that will be contributing to her symptoms. At this point following results of her x-ray and also CT of abdomen, patient will be admitted to medical floor; keep NPO, PRN  antiemetics and pain medications; NGT to  be placed if the patient experienced any further episode of nausea, pain, or vomiting (patient comfortable and asymptomatic at the moment of my examination after she received name medications and antiemetics in the ED; she is refusing to have the NG tube placed at this moment; but is in agreement to place it if she develops any further symptoms). Surgery has been consulted by ED physician and will follow any other recommendations. KUB in the morning in order to follow and assess obstruction.   2-CROHN'S DISEASE:plan is to resume home regimen for her Crohn disease as soon as she can tolerates by mouth medications.  3-SBO (small bowel obstruction):patient with prior history of SBO in the past high concerns for adhesions as the main cause for her obstruction given multiple surgeries in the past. Treatment as mentioned above.  4-Anxiety:currently stable; will use PRN Ativan through her pains while the patient is n.p.o.  5-Nausea and vomiting: most likely due to SBO. Will use PRN antiemetics; if symptoms persist will also place NGT.  6-Unspecified asthma:currently no wheezing. Will use PRN albuterol.  7-GERD (gastroesophageal reflux disease):continue  PPI (will be given through her veins while n.p.o. Status is present).  DVT prophylaxis: Heparin.   Code Status: Full Family Communication: Boyfriend at bedside Disposition Plan: Back home when medically stable  Time spent: >30 minutes  Isai Gottlieb [facility] Pager [phone]  If 7PM-7AM, please contact night-coverage [email or web address] Password [email or web address] 03/02/2012, 8:20 AM

## 2012-03-02 NOTE — ED Notes (Signed)
Pt sts that she has been vomiting since 2pm and has not stopped. Pt sts she has taken medicine for the nausea and vomiting.

## 2012-03-02 NOTE — ED Provider Notes (Signed)
History     CSN: [id number]  Arrival date & time 03/02/12  0211   First MD Initiated Contact with Patient 03/02/12 [phone]      Chief Complaint  Patient presents with  . Abdominal Pain    (Consider location/radiation/quality/duration/timing/severity/associated sxs/prior treatment) HPI Comments: Patient is a 21 year old female with a history of Crohn's disease, small bowel obstruction, DVT, and chronic anemia that presents emergency department with chief complaint of abdominal pain.  Onset of symptoms began approximately 2 weeks ago and have been gradually worsening.  Patient was evaluated by her gastroenterologist's PA at that time and was given pain medication and nausea medication.  These therapies worked temporarily until yesterday when the patient developed hyperemesis at approximately 2 p.m.  Patient denies any hematemesis, hematochezia, melena, constipation, diarrhea, chest pain, shortness of breath, leg swelling, cough, hemoptysis, fever, night sweats or chills.  She reports her last normal bowel movement was yesterday around 130 p.m. pain is located periumbilically and in the left upper quadrant, described as constant and sharp and rated at a 10/10 in severity.  Pain does not radiate and is only associated with severe nausea and emesis.  Patient states she's not pass flatulence today.  Patient with multiple abdominal surgical history is including appendectomy, cholecystectomy, colectomy with ileostomy placement and reversal.  Patient is a 21 y.o. female presenting with abdominal pain. The history is provided by the patient.  Abdominal Pain The primary symptoms of the illness include abdominal pain, nausea and vomiting. The primary symptoms of the illness do not include fever, shortness of breath, diarrhea or dysuria.  Symptoms associated with the illness do not include chills, constipation, urgency or frequency.    Past Medical History  Diagnosis Date  . 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 Anxiety   . Candida esophagitis   . Ovarian cyst, left   . Anemia     iron def  . DVT (deep venous thrombosis) 08/2010    Left IJ (postop) anticoag x 3 mo  . SVT (supraventricular tachycardia)   . Depression   . Small bowel obstruction   . Acute renal failure   . MRSA (methicillin resistant Staphylococcus aureus)     multiple abscesses    Past Surgical History  Procedure Date  . Cholecystectomy 2009  . Closure of ileostomy/loop 08/2010  . Colectomy with diverting loop ileostomy   . Appendectomy   . Esophagogastroduodenoscopy 11/11/2011    Procedure: ESOPHAGOGASTRODUODENOSCOPY (EGD);  Surgeon: Lafayette Dragon, MD;  Location: [facility];  Service: Endoscopy;  Laterality: N/A;  . Examination under anesthesia 11/12/2011    Procedure: EXAM UNDER ANESTHESIA;  Surgeon: Rolm Bookbinder, MD;  Location: [facility];  Service: General;  Laterality: N/A;  . Proctoscopy 11/12/2011    Procedure: PROCTOSCOPY;  Surgeon: Rolm Bookbinder, MD;  Location: [facility];  Service: General;  Laterality: N/A;  . Incision and drainage perirectal abscess 11/12/2011    Procedure: IRRIGATION AND DEBRIDEMENT PERIRECTAL ABSCESS;  Surgeon: Rolm Bookbinder, MD;  Location: [facility];  Service: General;  Laterality: N/A;  . Incision and drainage perirectal abscess 12/11/2011    Procedure: IRRIGATION AND DEBRIDEMENT PERIRECTAL ABSCESS;  Surgeon: [name], MD;  Location: [facility];  Service: General;  Laterality: N/A;    Family History  Problem Relation Age of Onset  . Diabetes Maternal Uncle   . Breast cancer Maternal Grandmother   . Colon cancer Neg Hx   . Diabetes Cousin     History  Substance Use Topics  . Smoking status: Never Smoker   . Smokeless tobacco: Never Used  . Alcohol Use: No    OB History    Grav Para Term Preterm Abortions TAB SAB Ect Mult Living                  Review of Systems  Constitutional: Negative for fever, chills and appetite  change.  HENT: Negative for congestion.   Eyes: Negative for visual disturbance.  Respiratory: Negative for shortness of breath.   Cardiovascular: Negative for chest pain and leg swelling.  Gastrointestinal: Positive for nausea, vomiting and abdominal pain. Negative for diarrhea, constipation, blood in stool, abdominal distention, anal bleeding and rectal pain.  Genitourinary: Negative for dysuria, urgency and frequency.  Skin: Negative for color change.  Neurological: Negative for dizziness, syncope, weakness, light-headedness, numbness and headaches.  Psychiatric/Behavioral: Negative for confusion.  All other systems reviewed and are negative.    Allergies  Iron dextran and Iron  Home Medications   Current Outpatient Rx  Name Route Sig Dispense Refill  . ACYCLOVIR 800 MG PO TABS  Take 1 tablet by mouth three times daily x 1 week 21 tablet 0  . CEPHALEXIN 250 MG PO CAPS Oral Take 1 capsule (250 mg total) by mouth 4 (four) times daily. 40 capsule 0  . CERTOLIZUMAB PEGOL 2 X 200 MG Optima KIT Subcutaneous Inject 200 mg into the skin every 14 (fourteen) days. 2 each 11  . ESOMEPRAZOLE MAGNESIUM 40 MG PO CPDR Oral Take 80 mg by mouth daily before breakfast. TAKE 2 CAP    . FLUCONAZOLE 100 MG PO TABS  Take 1 tablet by mouth once daily x 3 days. Repeat if infection reccurs. 3 tablet 0  . LORAZEPAM 1 MG PO TABS Oral Take 1 tablet (1 mg total) by mouth every 6 (six) hours as needed. ANXIETY 30 tablet 1  . MERCAPTOPURINE 50 MG PO TABS Oral Take 1 tablet (50 mg total) by mouth daily. 30 tablet 2  . OXYCODONE-ACETAMINOPHEN 5-325 MG PO TABS Oral Take 1 tablet by mouth every 4 (four) hours as needed. PAIN 40 tablet 0  . PROMETHAZINE HCL 25 MG PO TABS Oral Take 1 tablet (25 mg total) by mouth every 4 (four) hours as needed for nausea. Take 1 tablet by mouth every 4-6 hours as needed for nausea 30 tablet 1    BP 144/99  Pulse 91  Temp 98.2 F (36.8 C) (Oral)  Resp 18  Ht 5' 4"  (1.626 m)  SpO2 98%   LMP 02/17/2012  Physical Exam  Constitutional: She is oriented to person, place, and time. She appears well-developed and well-nourished. No distress.  HENT:  Head: Normocephalic and atraumatic.  Mouth/Throat: Oropharynx is clear and moist. No oropharyngeal exudate.  Eyes: Conjunctivae and EOM are normal. Pupils are equal, round, and reactive to light. No scleral icterus.  Neck: Normal range of motion. Neck supple. No tracheal deviation present. No thyromegaly present.  Cardiovascular: Normal rate, regular rhythm, normal heart sounds and intact distal pulses.   Pulmonary/Chest: Effort normal and breath sounds normal. No stridor. No respiratory distress. She has no wheezes.  Abdominal: Soft.       Soft extremely tender abdomen throughout.  Tympanic to percussion and right upper quadrant.  Bowel sounds hypoactive.  Large Surgical scar midline  Musculoskeletal: Normal range of motion. She exhibits no edema and no tenderness.  Neurological: She is alert and oriented to person, place, and time. Coordination normal.  Skin: Skin  is warm and dry. No rash noted. She is not diaphoretic. No erythema. No pallor.  Psychiatric: She has a normal mood and affect. Her behavior is normal.    ED Course  Procedures (including critical care time)  Labs Reviewed  CBC WITH DIFFERENTIAL - Abnormal; Notable for the following:    Hemoglobin 11.9 (*)     MCH 25.6 (*)     RDW 15.6 (*)     Platelets 498 (*)     Neutrophils Relative 80 (*)     Neutro Abs 7.9 (*)     All other components within normal limits  COMPREHENSIVE METABOLIC PANEL - Abnormal; Notable for the following:    Sodium 132 (*)     Glucose, Bld 132 (*)     All other components within normal limits  URINALYSIS, ROUTINE W REFLEX MICROSCOPIC - Abnormal; Notable for the following:    APPearance CLOUDY (*)     All other components within normal limits  PREGNANCY, URINE   Ct Abdomen Pelvis W Contrast  03/02/2012  *RADIOLOGY REPORT*  Clinical  Data: Abdominal pain.  Nausea and vomiting.  History of Crohn's disease.  Perianal fistula.  Prior small bowel obstructions.  CT ABDOMEN AND PELVIS WITH CONTRAST  Technique:  Multidetector CT imaging of the abdomen and pelvis was performed following the standard protocol during bolus administration of intravenous contrast.  Contrast:  100 ml Omnipaque-300  Comparison: Multiple exams, including 01/22/2012 and 11/08/2011 as well as acute abdomen series of 03/02/2012  Findings: The liver, spleen, pancreas, and adrenal glands appear unremarkable.  The gallbladder is surgically absent. The kidneys appear unremarkable, as do the proximal ureters.  Oral contrast medium is present in the stomach and small bowel. There is a grouping of dilated small bowel loops centrally in the abdomen apparently extending to a small bowel and anastomotic staple line just to the left of midline on image 59 of series 2, with multiple air-fluid levels in the involved loops.  Abnormal surrounding mesenteric edema and mild ascites noted, as on the prior exam.  The distal small bowel loops are not dilated.  Pelvic ascites noted particularly in the cul-de-sac.  Hypodense lesion of the left ovary measures 1.9 cm.  Uterus appears unremarkable.  Stable scarring along the left rectus abdominus muscle noted at the level of the umbilicus.  The patient has a history of perianal fistula which is well shown on today's CT scan, and is better characterized by MRI.  IMPRESSION:  1.  Central abdominal loops of small bowel with apparent transition in the vicinity of the small bowel anastomotic staple lines of the left of midline.  The appearance is compatible with small-bowel obstruction.  As on the prior CT exam, there is surrounding mesenteric edema as well as a mildly complex ascites. 2.  Hypodense left ovarian lesion, possibly a cyst, measuring 1.7 cm in diameter.  The three stable scarring and mild thickening of the left rectus abdominus muscle at the  level of the umbilicus. 3.  The patient's known perianal fistula is not included/readily apparent on today's exam, and is typically better characterized by dedicated protocol MRI.   Original Report Authenticated By: Carron Curie, M.D.    Dg Abd Acute W/chest  03/02/2012  *RADIOLOGY REPORT*  Clinical Data: Sudden onset upper abdominal pain and nausea.  ACUTE ABDOMEN SERIES (ABDOMEN 2 VIEW & CHEST 1 VIEW)  Comparison: Chest 06/27/2011, abdomen 11/10/2011.  Findings: Shallow inspiration.  Normal heart size and pulmonary vascularity.  No focal airspace  consolidation in the lungs.  No blunting of costophrenic angles.  No pneumothorax.  Mediastinal contours appear intact.  No significant change since previous chest radiograph.  Dilated gas-filled mid abdominal small bowel loops with multiple air-fluid levels consistent with small bowel obstruction.  A paucity of gas in the colon without distension.  Surgical clips in the right upper quadrant.  No free intra-abdominal air.  No radiopaque stones.  Postoperative changes in the right lower quadrant.  Visualized bones appear intact.  IMPRESSION: No evidence of active pulmonary disease.  Gas filled dilated small bowel with air-fluid levels consistent with obstruction.   Original Report Authenticated By: Neale Burly, M.D.      No diagnosis found.   Presentation and history concerning for small bowel obstruction. MDM  Small bowel obstruction  Patient with a history of Crohn's and multiple abdominal surgeries presented with hyperemesis and abdominal pain that began 2 weeks ago gradually worsening becoming acutely severe yesterday around 2 p.m. labs and imaging reviewed.  Diagnosis of small bowel obstruction.  NG tube placed.  Patient is n.p.o.  Fluids and bowel rest ordered.  Patient to be admitted to [facility].  Vital signs within normal limits.  Pain and nausea currently being managed in emergency department with Dilaudid and Phenergan.  Consult to  [place] surgery: BC of SBO etiology likely post surgical adhesions & complex ascites, Last meal yesterday at 3:30 PM, Dr. Arnoldo Morale to see as consult  Hospitalist: Pt refused NG tube and is not currently having s/s of SBO. Hold all nausea and pain meds until pt is agreeable to NG tube placement  GI physician: Dr. Maurene Capes  PCP: Dr.Leschber      Verl Dicker, PA-C 03/02/12 [phone]

## 2012-03-03 ENCOUNTER — Inpatient Hospital Stay (HOSPITAL_COMMUNITY): Payer: 59

## 2012-03-03 DIAGNOSIS — A6000 Herpesviral infection of urogenital system, unspecified: Secondary | ICD-10-CM

## 2012-03-03 DIAGNOSIS — K5090 Crohn's disease, unspecified, without complications: Secondary | ICD-10-CM

## 2012-03-03 LAB — BASIC METABOLIC PANEL
BUN: 6 mg/dL (ref 6–23)
Calcium: 8.5 mg/dL (ref 8.4–10.5)
Creatinine, Ser: 0.6 mg/dL (ref 0.50–1.10)
GFR calc Af Amer: >90 mL/min (ref >90)
GFR calc non Af Amer: >90 mL/min (ref >90)
Potassium: 3.9 mEq/L (ref 3.5–5.1)

## 2012-03-03 LAB — CBC
HCT: 30.9 % — ABNORMAL LOW (ref 36.0–46.0)
MCH: 26 pg (ref 26.0–34.0)
MCHC: 31.4 g/dL (ref 30.0–36.0)
RDW: 15.5 % (ref 11.5–15.5)

## 2012-03-03 MED ORDER — PROMETHAZINE HCL 25 MG PO TABS
12.5000 mg | ORAL_TABLET | ORAL | Status: DC | PRN
  Administered 2012-03-03 – 2012-03-04 (×2): 12.5 mg via ORAL
  Filled 2012-03-03 (×3): qty 1

## 2012-03-03 MED ORDER — PANTOPRAZOLE SODIUM 40 MG PO TBEC
80.0000 mg | DELAYED_RELEASE_TABLET | Freq: Every day | ORAL | Status: DC
  Administered 2012-03-04 – 2012-03-05 (×2): 80 mg via ORAL
  Filled 2012-03-03 (×2): qty 2

## 2012-03-03 MED ORDER — MORPHINE SULFATE 2 MG/ML IJ SOLN
2.0000 mg | INTRAMUSCULAR | Status: DC | PRN
  Administered 2012-03-03 – 2012-03-04 (×11): 2 mg via INTRAVENOUS
  Filled 2012-03-03 (×14): qty 1

## 2012-03-03 MED ORDER — LORAZEPAM 1 MG PO TABS
1.0000 mg | ORAL_TABLET | Freq: Four times a day (QID) | ORAL | Status: DC | PRN
  Administered 2012-03-03 – 2012-03-05 (×7): 1 mg via ORAL
  Filled 2012-03-03 (×7): qty 1

## 2012-03-03 MED ORDER — MORPHINE SULFATE 2 MG/ML IJ SOLN
2.0000 mg | Freq: Once | INTRAMUSCULAR | Status: AC
  Administered 2012-03-03: 2 mg via INTRAVENOUS

## 2012-03-03 MED ORDER — MORPHINE SULFATE 2 MG/ML IJ SOLN
1.0000 mg | INTRAMUSCULAR | Status: AC
  Administered 2012-03-03: 1 mg via INTRAVENOUS
  Filled 2012-03-03: qty 1

## 2012-03-03 MED ORDER — MERCAPTOPURINE 50 MG PO TABS
50.0000 mg | ORAL_TABLET | Freq: Every day | ORAL | Status: DC
  Administered 2012-03-03 – 2012-03-04 (×2): 50 mg via ORAL
  Filled 2012-03-03 (×3): qty 1

## 2012-03-03 MED ORDER — MORPHINE BOLUS VIA INFUSION
2.0000 mg | Freq: Once | INTRAVENOUS | Status: DC
  Filled 2012-03-03: qty 2

## 2012-03-03 NOTE — Progress Notes (Signed)
Patient ID: [name], female   DOB: 12/27/1990, 21 y.o.   MRN: [id number]    Subjective: Pt feels better.  No longer nauseated.  Passing flatus.  hungry  Objective: Vital signs in last 24 hours: Temp:  [98.1 F (36.7 C)-98.7 F (37.1 C)] 98.2 F (36.8 C) (08/21 2774) Pulse Rate:  [63-86] 72  (08/21 0613) Resp:  [16-18] 16  (08/21 0613) BP: (119-132)/(53-81) 119/53 mmHg (08/21 0613) SpO2:  [97 %-100 %] 100 % (08/21 1287) Weight:  [227 lb (102.967 kg)] 227 lb (102.967 kg) (08/20 1035) Last BM Date: 03/01/12  Intake/Output from previous day:   Intake/Output this shift:    PE: Abd: soft, minimal tenderness, +BS, ND  Lab Results:   [facility] 03/03/12 0350 03/02/12 0329  WBC 6.9 9.9  HGB 9.7* 11.9*  HCT 30.9* 38.2  PLT 383 498*   BMET  Basename 03/03/12 0350 03/02/12 0329  NA 137 132*  K 3.9 3.5  CL 106 98  CO2 27 26  GLUCOSE 81 132*  BUN 6 7  CREATININE 0.60 0.57  CALCIUM 8.5 9.8   PT/INR No results found for this basename: LABPROT:2,INR:2 in the last 72 hours CMP     Component Value Date/Time   NA 137 03/03/2012 0350   K 3.9 03/03/2012 0350   CL 106 03/03/2012 0350   CO2 27 03/03/2012 0350   GLUCOSE 81 03/03/2012 0350   BUN 6 03/03/2012 0350   CREATININE 0.60 03/03/2012 0350   CALCIUM 8.5 03/03/2012 0350   PROT 7.9 03/02/2012 0329   ALBUMIN 3.8 03/02/2012 0329   AST 15 03/02/2012 0329   ALT 12 03/02/2012 0329   ALKPHOS 53 03/02/2012 0329   BILITOT 0.3 03/02/2012 0329   GFRNONAA >90 03/03/2012 0350   GFRAA >90 03/03/2012 0350   Lipase     Component Value Date/Time   LIPASE 10* 11/08/2011 0725       Studies/Results: Ct Abdomen Pelvis W Contrast  03/02/2012  *RADIOLOGY REPORT*  Clinical Data: Abdominal pain.  Nausea and vomiting.  History of Crohn's disease.  Perianal fistula.  Prior small bowel obstructions.  CT ABDOMEN AND PELVIS WITH CONTRAST  Technique:  Multidetector CT imaging of the abdomen and pelvis was performed following the standard protocol  during bolus administration of intravenous contrast.  Contrast:  100 ml Omnipaque-300  Comparison: Multiple exams, including 01/22/2012 and 11/08/2011 as well as acute abdomen series of 03/02/2012  Findings: The liver, spleen, pancreas, and adrenal glands appear unremarkable.  The gallbladder is surgically absent. The kidneys appear unremarkable, as do the proximal ureters.  Oral contrast medium is present in the stomach and small bowel. There is a grouping of dilated small bowel loops centrally in the abdomen apparently extending to a small bowel and anastomotic staple line just to the left of midline on image 59 of series 2, with multiple air-fluid levels in the involved loops.  Abnormal surrounding mesenteric edema and mild ascites noted, as on the prior exam.  The distal small bowel loops are not dilated.  Pelvic ascites noted particularly in the cul-de-sac.  Hypodense lesion of the left ovary measures 1.9 cm.  Uterus appears unremarkable.  Stable scarring along the left rectus abdominus muscle noted at the level of the umbilicus.  The patient has a history of perianal fistula which is well shown on today's CT scan, and is better characterized by MRI.  IMPRESSION:  1.  Central abdominal loops of small bowel with apparent transition in the vicinity of the small  bowel anastomotic staple lines of the left of midline.  The appearance is compatible with small-bowel obstruction.  As on the prior CT exam, there is surrounding mesenteric edema as well as a mildly complex ascites. 2.  Hypodense left ovarian lesion, possibly a cyst, measuring 1.7 cm in diameter.  The three stable scarring and mild thickening of the left rectus abdominus muscle at the level of the umbilicus. 3.  The patient's known perianal fistula is not included/readily apparent on today's exam, and is typically better characterized by dedicated protocol MRI.   Original Report Authenticated By: Carron Curie, M.D.    Dg Abd 2 Views  03/03/2012   *RADIOLOGY REPORT*  Clinical Data: Follow up partial small bowel obstruction  ABDOMEN - 2 VIEW  Comparison: 03/02/2012  Findings: The dilated small bowel loops have resolved from previous exam.  There has  been resolution of small bowel air-fluid levels. Progression of enteric contrast material from the small bowel into the colon is noted.  IMPRESSION:  1.  Interval improvement in small bowel obstruction.   Original Report Authenticated By: Angelita Ingles, M.D.    Dg Abd Acute W/chest  03/02/2012  *RADIOLOGY REPORT*  Clinical Data: Sudden onset upper abdominal pain and nausea.  ACUTE ABDOMEN SERIES (ABDOMEN 2 VIEW & CHEST 1 VIEW)  Comparison: Chest 06/27/2011, abdomen 11/10/2011.  Findings: Shallow inspiration.  Normal heart size and pulmonary vascularity.  No focal airspace consolidation in the lungs.  No blunting of costophrenic angles.  No pneumothorax.  Mediastinal contours appear intact.  No significant change since previous chest radiograph.  Dilated gas-filled mid abdominal small bowel loops with multiple air-fluid levels consistent with small bowel obstruction.  A paucity of gas in the colon without distension.  Surgical clips in the right upper quadrant.  No free intra-abdominal air.  No radiopaque stones.  Postoperative changes in the right lower quadrant.  Visualized bones appear intact.  IMPRESSION: No evidence of active pulmonary disease.  Gas filled dilated small bowel with air-fluid levels consistent with obstruction.   Original Report Authenticated By: Neale Burly, M.D.     Anti-infectives: Anti-infectives    None       Assessment/Plan  1. PSBO, resolving 2. Crohn's disease  Plan: 1. X-rays improved and clinically improved.  Will allow clear liquids today.   LOS: 1 day    Magaret Justo E 03/03/2012

## 2012-03-03 NOTE — Progress Notes (Signed)
PROGRESS NOTE  [name] RAQ:[id number] DOB: 12/23/1990 DOA: 03/02/2012 PCP: Gwendolyn Grant, MD  Brief narrative: 21 year old female diagnosed with Crohn's at the age of 66, status post by ileocecectomy and diverting loop ileostomy 10 2011 presented 8/20 with recurrent small bowel obstruction   Past medical history-As per Problem list  Consultants:  general surgery   Procedures:   CT scan abdomen 03/02/12 = central abdominal loops small bowel with apparent transition in the 70s small bowel anastomotic staples of the left of midline.  The appearance is compatible small bowel obstruction this surrounding niece and tight edema as well as mild complex ascites, hypodense left ovarian lesion possibly a cyst measuring 1.7 cm in diameter.   Abdominal x-ray 03/03/12 = interval improvement in small bowel obstruction   Antibiotics:   none present    Subjective   feels better although still slight nausea, + flatus, - stool, abdominal pain rated 5/10 Getting scheduled opiates Requested home medication of Ativan every 6 hourly, requests Phenergan for nausea in preference to Zofran    Objective    Interim History:  nursing notes reviewed   surgery input noted and appreciated   Telemetry:  non-telemetry   Objective: Filed Vitals:   03/02/12 1345 03/02/12 2137 03/03/12 0613 03/03/12 1400  BP: 125/74 123/74 119/53 103/71  Pulse: 68 63 72 76  Temp: 98.7 F (37.1 C) 98.1 F (36.7 C) 98.2 F (36.8 C) 98.6 F (37 C)  TempSrc: Oral Oral Oral Oral  Resp: 16 16 16 18   Height:      Weight:      SpO2: 98% 100% 100% 97%    Intake/Output Summary (Last 24 hours) at 03/03/12 1649 Last data filed at 03/03/12 1200  Gross per 24 hour  Intake     50 ml  Output      0 ml  Net     50 ml    Exam:  General:  alert pleasant morbidly obese African American female in no apparent distress  Cardiovascular:  S1-S2 no murmur rub or gallop Respiratory:  Clear no TVR no TVF Abdomen:   right lateral midline abdominal scar about 30 cm long.  Abdomen slightly distended, bowel sounds decreased but present, tenderness on palpation no rebound no guarding  Skin see above  Neuromotor intact   Data Reviewed: Basic Metabolic Panel:  Lab 45/62/56 0350 03/02/12 0329  NA 137 132*  K 3.9 3.5  CL 106 98  CO2 27 26  GLUCOSE 81 132*  BUN 6 7  CREATININE 0.60 0.57  CALCIUM 8.5 9.8  MG -- 1.9  PHOS -- 3.2   Liver Function Tests:  Lab 03/02/12 0329  AST 15  ALT 12  ALKPHOS 53  BILITOT 0.3  PROT 7.9  ALBUMIN 3.8   No results found for this basename: LIPASE:5,AMYLASE:5 in the last 168 hours No results found for this basename: AMMONIA:5 in the last 168 hours CBC:  Lab 03/03/12 0350 03/02/12 0329  WBC 6.9 9.9  NEUTROABS -- 7.9*  HGB 9.7* 11.9*  HCT 30.9* 38.2  MCV 82.8 82.2  PLT 383 498*   Cardiac Enzymes: No results found for this basename: CKTOTAL:5,CKMB:5,CKMBINDEX:5,TROPONINI:5 in the last 168 hours BNP: No components found with this basename: POCBNP:5 CBG: No results found for this basename: GLUCAP:5 in the last 168 hours  Recent Results (from the past 240 hour(s))  MRSA PCR SCREENING     Status: Normal   Collection Time   03/02/12  6:22 PM  Component Value Range Status Comment   MRSA by PCR NEGATIVE  NEGATIVE Final      Studies:              All Imaging reviewed and is as per above notation   Scheduled Meds:   . sodium chloride   Intravenous STAT  . sodium chloride   Intravenous STAT  . general admission iv infusion   Intravenous Once  . heparin  5,000 Units Subcutaneous Q8H  .  morphine injection  1 mg Intravenous NOW  . pantoprazole (PROTONIX) IV  40 mg Intravenous Q12H   Continuous Infusions:    Assessment/Plan: 1.  improving small bowel obstruction-appreciate general surgery impression.  Continue clear fluids.  Abdominal x-ray a.m.  Gradually diet per surgeon. 2. Crohn's disease-restart mercaptopurine 50 mg daily, patient to restart  her Certolizumab as an outpatient-detailed discussion should occur with patient regarding risk-benefit ratio of this by her gastroenterologist 3. Anxiety-restarted Ativan one tablet 1 mg every 6 as needed-primary care physician to determine need for this and whether this should be tapered or whether she needs counseling 4. Nausea-patient has a preference for Phenergan which was restarted today at 12.5 every 4 when necessary 5. Acyclovir used for unknown reasons, we will investigate the same   Code Status: Full Family Communication: Discussed the plan of care in detail with mother at bedside  Disposition Plan:  home unable to touch her diet and take by mouth    Verneita Griffes, MD  [facility] Pager [phone] 03/03/2012, 4:49 PM    LOS: 1 day

## 2012-03-03 NOTE — Progress Notes (Signed)
CARE MANAGEMENT NOTE 03/03/2012  Patient:  [name], [name]   Account Number:  [id number]  Date Initiated:  03/03/2012  Documentation initiated by:  Enrrique Mierzwa  Subjective/Objective Assessment:   pt with abd pain recent sbo hx of crohn's ct scan confirmed sbo     Action/Plan:   home   Anticipated DC Date:  03/06/2012   Anticipated DC Plan:  HOME/SELF CARE  In-house referral  NA      DC Planning Services  NA      [facility] Choice  NA   Choice offered to / List presented to:  NA   DME arranged  NA      DME agency  NA     [place] arranged  NA      [place] agency  NA   Status of service:  In process, will continue to follow Medicare Important Message given?  NA - LOS <3 / Initial given by admissions (If response is "NO", the following Medicare IM given date fields will be blank) Date Medicare IM given:   Date Additional Medicare IM given:    Discharge Disposition:    Per UR Regulation:  Reviewed for med. necessity/level of care/duration of stay  If discussed at [place] of Stay Meetings, dates discussed:    Comments:  [id number]/Jarome Trull Rosana Hoes, RN, BSN, CCM: CHART REVIEWED AND UPDATED. NO DISCHARGE NEEDS PRESENT AT THIS TIME. CASE MANAGEMENT [phone]

## 2012-03-03 NOTE — Progress Notes (Signed)
General Surgery [facility] Patient seen and examined.  Ambulatory.  Taking clear liquids.  Mild nausea.  AXR much improved.  Will follow. [name], MD, [facility] Office: [phone]

## 2012-03-04 ENCOUNTER — Inpatient Hospital Stay (HOSPITAL_COMMUNITY): Payer: 59

## 2012-03-04 DIAGNOSIS — K263 Acute duodenal ulcer without hemorrhage or perforation: Secondary | ICD-10-CM

## 2012-03-04 DIAGNOSIS — I82C19 Acute embolism and thrombosis of unspecified internal jugular vein: Secondary | ICD-10-CM

## 2012-03-04 LAB — CBC
MCH: 24.8 pg — ABNORMAL LOW (ref 26.0–34.0)
MCHC: 30 g/dL (ref 30.0–36.0)
Platelets: 385 10*3/uL (ref 150–400)
RDW: 15.5 % (ref 11.5–15.5)

## 2012-03-04 MED ORDER — HYDROMORPHONE HCL PF 1 MG/ML IJ SOLN
1.0000 mg | INTRAMUSCULAR | Status: DC | PRN
  Administered 2012-03-04 – 2012-03-05 (×3): 1 mg via INTRAVENOUS
  Filled 2012-03-04 (×3): qty 1

## 2012-03-04 MED ORDER — CAMPHOR-MENTHOL 0.5-0.5 % EX LOTN
TOPICAL_LOTION | CUTANEOUS | Status: DC | PRN
  Filled 2012-03-04: qty 222

## 2012-03-04 NOTE — Progress Notes (Signed)
Patient ID: [name], female   DOB: [birth year], 21 y.o.   MRN: [id number]    Subjective: Pt is very groggy from pain medicine, wanting ativan.  Tolerated clears well.  C/o slight nausea.  Unable to tell if this is somewhat chronic or not.  Objective: Vital signs in last 24 hours: Temp:  [97.4 F (36.3 C)-98.6 F (37 C)] 97.4 F (36.3 C) (08/22 0525) Pulse Rate:  [53-76] 72  (08/22 0525) Resp:  [18] 18  (08/22 0525) BP: (103-132)/(51-75) 110/60 mmHg (08/22 0535) SpO2:  [94 %-99 %] 99 % (08/22 0525) Last BM Date: 03/01/12  Intake/Output from previous day: 04-01-2023 0701 - 08/22 0700 In: 50 [I.V.:50] Out: -  Intake/Output this shift: Total I/O In: 720 [P.O.:720] Out: -   PE: Abd: soft, minimal tenderness, +BS, ND  Lab Results:   [facility] 03/04/12 0351 03/31/12 0350  WBC 5.5 6.9  HGB 9.4* 9.7*  HCT 31.3* 30.9*  PLT 385 383   BMET  Basename 2012-03-31 0350 03/02/12 0329  NA 137 132*  K 3.9 3.5  CL 106 98  CO2 27 26  GLUCOSE 81 132*  BUN 6 7  CREATININE 0.60 0.57  CALCIUM 8.5 9.8   PT/INR No results found for this basename: LABPROT:2,INR:2 in the last 72 hours CMP     Component Value Date/Time   NA 137 03/31/12 0350   K 3.9 31-Mar-2012 0350   CL 106 2012-03-31 0350   CO2 27 2012/03/31 0350   GLUCOSE 81 03-31-12 0350   BUN 6 03-31-2012 0350   CREATININE 0.60 03/31/12 0350   CALCIUM 8.5 2012/03/31 0350   PROT 7.9 03/02/2012 0329   ALBUMIN 3.8 03/02/2012 0329   AST 15 03/02/2012 0329   ALT 12 03/02/2012 0329   ALKPHOS 53 03/02/2012 0329   BILITOT 0.3 03/02/2012 0329   GFRNONAA >90 2012-03-31 0350   GFRAA >90 31-Mar-2012 0350   Lipase     Component Value Date/Time   LIPASE 10* 11/08/2011 0725       Studies/Results: Dg Abd 2 Views  03-31-12  *RADIOLOGY REPORT*  Clinical Data: Follow up partial small bowel obstruction  ABDOMEN - 2 VIEW  Comparison: 03/02/2012  Findings: The dilated small bowel loops have resolved from previous exam.  There has  been  resolution of small bowel air-fluid levels. Progression of enteric contrast material from the small bowel into the colon is noted.  IMPRESSION:  1.  Interval improvement in small bowel obstruction.   Original Report Authenticated By: Angelita Ingles, M.D.    Dg Abd Acute W/chest  03/04/2012  *RADIOLOGY REPORT*  Clinical Data: Mid abdominal pain, small bowel obstruction, follow- up  ACUTE ABDOMEN SERIES (ABDOMEN 2 VIEW & CHEST 1 VIEW)  Comparison: Abdomen films of 03-31-2012 and chest x-ray of 03/02/2012  Findings: The lungs remain clear.  Mediastinal contours are stable. The heart is within normal limits in size.  There has been improvement in gaseous distention of small bowel consistent with improving partial small bowel obstruction.  Some contrast is noted throughout the nondistended colon and air is seen to the rectum.  No free air is seen.  IMPRESSION:  1.  Improvement in partial small bowel obstruction.  No free air. 2.  No active lung disease.   Original Report Authenticated By: Joretta Bachelor, M.D.     Anti-infectives: Anti-infectives    None       Assessment/Plan  1. PSBO 2. Crohn's disease  Plan: 1. Advance to full liquid diet  today 2. No surgical indications currently   LOS: 2 days    Densil Ottey E 03/04/2012

## 2012-03-04 NOTE — Progress Notes (Signed)
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  The appearance is compatible small bowel obstruction this surrounding niece and tight edema as well as mild complex ascites, hypodense left ovarian lesion possibly a cyst measuring 1.7 cm in diameter.   Abdominal x-ray 03/03/12 = interval improvement in small bowel obstruction   Antibiotics:   none present    Subjective  abdominal pain rated 5/10-states it goes upto 9/10 with movement Getting scheduled opiates Requested home medication of Ativan every 6 hourly, requests Phenergan for nausea in preference to Zofran    Objective    Interim History:  nursing notes reviewed   surgery input noted and appreciated   Telemetry:  non-telemetry   Objective: Filed Vitals:   03/03/12 1400 03/03/12 2131 03/04/12 0525 03/04/12 0535  BP: 103/71 132/75 106/51 110/60  Pulse: 76 53 72   Temp: 98.6 F (37 C) 98.2 F (36.8 C) 97.4 F (36.3 C)   TempSrc: Oral Oral Oral   Resp: 18 18 18    Height:      Weight:      SpO2: 97% 94% 99%     Intake/Output Summary (Last 24 hours) at 03/04/12 1001 Last data filed at 03/04/12 0959  Gross per 24 hour  Intake    770 ml  Output      0 ml  Net    770 ml    Exam:  General:  alert pleasant morbidly obese African American female in no apparent distress  Cardiovascular:  S1-S2 no murmur rub or gallop Abdomen:  right lateral midline abdominal scar about 30 cm long.    Data Reviewed: Basic Metabolic  Panel:  Lab 91/79/15 0350 03/02/12 0329  NA 137 132*  K 3.9 3.5  CL 106 98  CO2 27 26  GLUCOSE 81 132*  BUN 6 7  CREATININE 0.60 0.57  CALCIUM 8.5 9.8  MG -- 1.9  PHOS -- 3.2   Liver Function Tests:  Lab 03/02/12 0329  AST 15  ALT 12  ALKPHOS 53  BILITOT 0.3  PROT 7.9  ALBUMIN 3.8   No results found for this basename: LIPASE:5,AMYLASE:5 in the last 168 hours No results found for this basename: AMMONIA:5 in the last 168 hours CBC:  Lab 03/04/12 0351 03/03/12 0350 03/02/12 0329  WBC 5.5 6.9 9.9  NEUTROABS -- -- 7.9*  HGB 9.4* 9.7* 11.9*  HCT 31.3* 30.9* 38.2  MCV 82.6 82.8 82.2  PLT 385 383 498*   Cardiac Enzymes: No results found for this basename: CKTOTAL:5,CKMB:5,CKMBINDEX:5,TROPONINI:5 in the last 168 hours BNP: No components found with this basename: POCBNP:5 CBG: No results found for this basename: GLUCAP:5 in the last 168 hours  Recent Results (from the past 240 hour(s))  MRSA PCR SCREENING     Status: Normal   Collection Time   03/02/12  6:22 PM      Component Value Range Status Comment   MRSA by PCR NEGATIVE  NEGATIVE Final      Studies:  All Imaging reviewed and is as per above notation   Scheduled Meds:    . sodium chloride   Intravenous STAT  . general admission iv infusion   Intravenous Once  . heparin  5,000 Units Subcutaneous Q8H  . mercaptopurine  50 mg Oral QHS  .  morphine injection  2 mg Intravenous Once  . pantoprazole  80 mg Oral Q1200  . DISCONTD: morphine  2 mg Intravenous Once  . DISCONTD: pantoprazole (PROTONIX) IV  40 mg Intravenous Q12H   Continuous Infusions:    Assessment/Plan: 1. Improving small bowel obstruction-appreciate general surgery impression.  Continue clear fluids.  Abdominal x-ray a.m.  Gradually diet per surgeon.  Risks/benefits of opiates discussed with patient.  Recommended that c SBO to consider other options-Patient adamantly requests Dilaudid IV.  Will re-start at 1 mg q4 prn. 2. Crohn's  disease-restart mercaptopurine 50 mg daily, patient to restart her Certolizumab as an outpatient-detailed discussion should occur with patient regarding risk-benefit ratio of this by her gastroenterologist 3. Anxiety-restarted Ativan one tablet 1 mg every 6 as needed-primary care physician to determine need for this and whether this should be tapered or whether she needs counseling 4. Nausea-patient has a preference for Phenergan which was restarted today at 12.5 every 4 when necessary 5. Acyclovir used for unknown reasons, we will investigate the same   Code Status: Full Family Communication: Discussed the plan of care in detail with patient Disposition Plan:  home when able to touand take by mouth    Verneita Griffes, MD  [facility] Pager [phone] 03/04/2012, 10:01 AM    LOS: 2 days

## 2012-03-04 NOTE — Progress Notes (Signed)
General Surgery [facility] Patient seen and evaluated.  Ambulating in halls.  Tolerating clear liquids.  To start full liquid diet this evening.  AXR improved.  Will follow. [name], MD, [facility] Office: [phone]

## 2012-03-05 DIAGNOSIS — K612 Anorectal abscess: Secondary | ICD-10-CM

## 2012-03-05 DIAGNOSIS — D509 Iron deficiency anemia, unspecified: Secondary | ICD-10-CM

## 2012-03-05 MED ORDER — DIPHENHYDRAMINE HCL 25 MG PO CAPS
25.0000 mg | ORAL_CAPSULE | Freq: Four times a day (QID) | ORAL | Status: DC | PRN
  Administered 2012-03-05: 25 mg via ORAL
  Filled 2012-03-05: qty 1

## 2012-03-05 MED ORDER — OXYCODONE-ACETAMINOPHEN 5-325 MG PO TABS
1.0000 | ORAL_TABLET | ORAL | Status: DC | PRN
  Administered 2012-03-05 (×2): 1 via ORAL
  Filled 2012-03-05 (×2): qty 1

## 2012-03-05 NOTE — Progress Notes (Signed)
General Surgery [facility] Patient continues to improve.  Will see as needed. [name], MD, [facility] Office: [phone]

## 2012-03-05 NOTE — Progress Notes (Signed)
Patient ID: [name], female   DOB: 02/24/1991, 21 y.o.   MRN: [id number]    Subjective: Pt feels good, except pain because "the doctor just pushed on my belly."  Tolerating full liquids with no further nausea.  Objective: Vital signs in last 24 hours: Temp:  [97.5 F (36.4 C)-98.1 F (36.7 C)] 98.1 F (36.7 C) (08/23 0535) Pulse Rate:  [67-70] 70  (08/23 0535) Resp:  [16-18] 16  (08/23 0535) BP: (110-133)/(67-74) 133/74 mmHg (08/23 0535) SpO2:  [100 %] 100 % (08/23 0535) Last BM Date: 03/04/12  Intake/Output from previous day: 08/22 0701 - 08/23 0700 In: 2040 [P.O.:2040] Out: -  Intake/Output this shift:    PE: Abd: soft, mild tenderness, +BS, ND  Lab Results:   [facility] 03/04/12 0351 03/03/12 0350  WBC 5.5 6.9  HGB 9.4* 9.7*  HCT 31.3* 30.9*  PLT 385 383   BMET  Basename 03/03/12 0350  NA 137  K 3.9  CL 106  CO2 27  GLUCOSE 81  BUN 6  CREATININE 0.60  CALCIUM 8.5   PT/INR No results found for this basename: LABPROT:2,INR:2 in the last 72 hours CMP     Component Value Date/Time   NA 137 03/03/2012 0350   K 3.9 03/03/2012 0350   CL 106 03/03/2012 0350   CO2 27 03/03/2012 0350   GLUCOSE 81 03/03/2012 0350   BUN 6 03/03/2012 0350   CREATININE 0.60 03/03/2012 0350   CALCIUM 8.5 03/03/2012 0350   PROT 7.9 03/02/2012 0329   ALBUMIN 3.8 03/02/2012 0329   AST 15 03/02/2012 0329   ALT 12 03/02/2012 0329   ALKPHOS 53 03/02/2012 0329   BILITOT 0.3 03/02/2012 0329   GFRNONAA >90 03/03/2012 0350   GFRAA >90 03/03/2012 0350   Lipase     Component Value Date/Time   LIPASE 10* 11/08/2011 0725       Studies/Results: Dg Abd Acute W/chest  03/04/2012  *RADIOLOGY REPORT*  Clinical Data: Mid abdominal pain, small bowel obstruction, follow- up  ACUTE ABDOMEN SERIES (ABDOMEN 2 VIEW & CHEST 1 VIEW)  Comparison: Abdomen films of 03/03/2012 and chest x-ray of 03/02/2012  Findings: The lungs remain clear.  Mediastinal contours are stable. The heart is within normal limits  in size.  There has been improvement in gaseous distention of small bowel consistent with improving partial small bowel obstruction.  Some contrast is noted throughout the nondistended colon and air is seen to the rectum.  No free air is seen.  IMPRESSION:  1.  Improvement in partial small bowel obstruction.  No free air. 2.  No active lung disease.   Original Report Authenticated By: Joretta Bachelor, M.D.     Anti-infectives: Anti-infectives    None       Assessment/Plan  1. PSBO, resolved  Plan: 1. Agree with regular low fiber diet. 2. Follow up with GI for crohn's 3. No surgical indications.  Will sign off.   LOS: 3 days    Valentina Alcoser E 03/05/2012

## 2012-03-05 NOTE — Discharge Summary (Signed)
Physician Discharge Summary  [name]:[id number] DOB: 20-Mar-1991 DOA: 03/02/2012  PCP: Gwendolyn Grant, MD  Admit date: 03/02/2012 Discharge date: 03/05/2012  Recommendations for Outpatient Follow-up:  1. Recommend outpatient followup with primary care physician and gastroenterologist-she potentially needs a pain management specialist and psychiatrist 2. Recommend low fiber diet  Discharge Diagnoses:  Principal Problem:  *Abdominal pain Active Problems:  CROHN'S DISEASE  SBO (small bowel obstruction)  Anxiety  Nausea and vomiting  Unspecified asthma  GERD (gastroesophageal reflux disease)   Discharge Condition: Good  Diet recommendation: Low fiber diet  Filed Weights   03/02/12 1035  Weight: 102.967 kg (227 lb)    History of present illness:  This 21 year old female was admitted 03/02/2012 with likely small bowel obstruction.  Patient has had a prior history of this and also history of ileocectomy and diverting loop ileostomy 04/2010.  General surgery consulted early in hospital course and they recommended that she have an NG tube initially she refused.  Patient was kept n.p.o. for one to 2 days but subsequently seemed to be doing better from general surgeon standpoint.  Hospital Course:    1. Improving small bowel obstruction-appreciate general surgery impression.  She was graduated to a full diet 03/05/2012 and tolerated the same well without any significant issues.  She did complain of some pain in her abdomen which was thought to be potentially secondary to set some element of reflux and her Crohn's issues.  Interestingly she had no diarrhea with these issues in past regular stool.  She was supposed to see Dr. [name] and in fact had recently been started back on since he is in 6-mercaptopurine as well as Keflex by Dr. [name]. 2. Crohn's disease-restart mercaptopurine 50 mg daily, patient to restart her Certolizumab as an outpatient-detailed discussion should occur  with patient regarding risk-benefit ratio of this by her gastroenterologist-given these medications are not without risk and the patient is a young menstruating female and believe she should be on some form of birth control which will need to be coordinated with her gastroenterologist and her primary care physician. 3. ?  Chronic pain-patient has been on the day his anxiolytics as well as Percocet for some amount of time.  I did mention to her the low therapeutic index of these medications and it was agreed that this would be discussed with her outpatient physicians once patient was discharged.  She did specify dosage forms and medication types that she preferred for her nausea and her pain as well as route of administration. 4. Anxiety-restarted Ativan one tablet 1 mg every 6 as needed-primary care physician to determine need for this and whether this should be tapered or whether she needs counseling 5. Nausea-patient has a preference for Phenergan which was restarted today at 12.5 every 4 when necessary 6. History herpes simplex-continue acyclovir 7. Unspecified asthma, probably mild intermittent-continue when necessary albuterol-recheck as an outpatient  Past medical history-As per Problem list  Consultants:  general surgery  Procedures:  CT scan abdomen 03/02/12 = central abdominal loops small bowel with apparent transition in the 70s small bowel anastomotic staples of the left of midline. The appearance is compatible small bowel obstruction this surrounding niece and tight edema as well as mild complex ascites, hypodense left ovarian lesion possibly a cyst measuring 1.7 cm in diameter.  Abdominal x-ray 03/03/12 = interval improvement in small bowel obstruction  Antibiotics:  none present    Discharge Exam: Filed Vitals:   03/05/12 1417  BP: 105/67  Pulse: 64  Temp: 98.2 F (36.8 C)  Resp: 18   Filed Vitals:   03/04/12 1303 03/04/12 2240 03/05/12 0535 03/05/12 1417  BP: 110/67 126/71  133/74 105/67  Pulse: 67 70 70 64  Temp: 97.7 F (36.5 C) 97.5 F (36.4 C) 98.1 F (36.7 C) 98.2 F (36.8 C)  TempSrc: Oral Oral Oral Oral  Resp: 16 18 16 18   Height:      Weight:      SpO2: 100% 100% 100% 100%    General: Alert oriented pleasant no distress Cardiovascular: S1-S2 no murmur rub or gallop Respiratory: Clinically clear no added sound Abdomen: Soft nontender nondistended midline scar objective findings out of portion to subjective complaints  Discharge Instructions  Discharge Orders    Future Orders Please Complete By Expires   Diet - low sodium heart healthy      Increase activity slowly      Call MD for:      Call MD for:  persistant nausea and vomiting      Call MD for:  severe uncontrolled pain      Call MD for:  redness, tenderness, or signs of infection (pain, swelling, redness, odor or green/yellow discharge around incision site)      Call MD for:  hives      Call MD for:  persistant dizziness or light-headedness        Medication List  As of 03/05/2012  4:43 PM   STOP taking these medications         Certolizumab Pegol 2 X 200 MG Kit         TAKE these medications         acyclovir 800 MG tablet   Commonly known as: ZOVIRAX   Take 1 tablet by mouth three times daily x 1 week      cephALEXin 250 MG capsule   Commonly known as: KEFLEX   Take 1 capsule (250 mg total) by mouth 4 (four) times daily.      esomeprazole 40 MG capsule   Commonly known as: NEXIUM   Take 80 mg by mouth daily before breakfast. TAKE 2 CAP      fluconazole 100 MG tablet   Commonly known as: DIFLUCAN   Take 1 tablet by mouth once daily x 3 days. Repeat if infection reccurs.      LORazepam 1 MG tablet   Commonly known as: ATIVAN   Take 1 tablet (1 mg total) by mouth every 6 (six) hours as needed. ANXIETY      mercaptopurine 50 MG tablet   Commonly known as: PURINETHOL   Take 1 tablet (50 mg total) by mouth daily.      oxyCODONE-acetaminophen 5-325 MG per tablet    Commonly known as: PERCOCET/ROXICET   Take 1 tablet by mouth every 4 (four) hours as needed. PAIN      promethazine 25 MG tablet   Commonly known as: PHENERGAN   Take 1 tablet (25 mg total) by mouth every 4 (four) hours as needed for nausea. Take 1 tablet by mouth every 4-6 hours as needed for nausea              The results of significant diagnostics from this hospitalization (including imaging, microbiology, ancillary and laboratory) are listed below for reference.    Significant Diagnostic Studies: Ct Abdomen Pelvis W Contrast  03/02/2012  *RADIOLOGY REPORT*  Clinical Data: Abdominal pain.  Nausea and vomiting.  History of Crohn's disease.  Perianal fistula.  Prior small  bowel obstructions.  CT ABDOMEN AND PELVIS WITH CONTRAST  Technique:  Multidetector CT imaging of the abdomen and pelvis was performed following the standard protocol during bolus administration of intravenous contrast.  Contrast:  100 ml Omnipaque-300  Comparison: Multiple exams, including 01/22/2012 and 11/08/2011 as well as acute abdomen series of 03/02/2012  Findings: The liver, spleen, pancreas, and adrenal glands appear unremarkable.  The gallbladder is surgically absent. The kidneys appear unremarkable, as do the proximal ureters.  Oral contrast medium is present in the stomach and small bowel. There is a grouping of dilated small bowel loops centrally in the abdomen apparently extending to a small bowel and anastomotic staple line just to the left of midline on image 59 of series 2, with multiple air-fluid levels in the involved loops.  Abnormal surrounding mesenteric edema and mild ascites noted, as on the prior exam.  The distal small bowel loops are not dilated.  Pelvic ascites noted particularly in the cul-de-sac.  Hypodense lesion of the left ovary measures 1.9 cm.  Uterus appears unremarkable.  Stable scarring along the left rectus abdominus muscle noted at the level of the umbilicus.  The patient has a history of  perianal fistula which is well shown on today's CT scan, and is better characterized by MRI.  IMPRESSION:  1.  Central abdominal loops of small bowel with apparent transition in the vicinity of the small bowel anastomotic staple lines of the left of midline.  The appearance is compatible with small-bowel obstruction.  As on the prior CT exam, there is surrounding mesenteric edema as well as a mildly complex ascites. 2.  Hypodense left ovarian lesion, possibly a cyst, measuring 1.7 cm in diameter.  The three stable scarring and mild thickening of the left rectus abdominus muscle at the level of the umbilicus. 3.  The patient's known perianal fistula is not included/readily apparent on today's exam, and is typically better characterized by dedicated protocol MRI.   Original Report Authenticated By: Carron Curie, M.D.    Dg Abd 2 Views  03/03/2012  *RADIOLOGY REPORT*  Clinical Data: Follow up partial small bowel obstruction  ABDOMEN - 2 VIEW  Comparison: 03/02/2012  Findings: The dilated small bowel loops have resolved from previous exam.  There has  been resolution of small bowel air-fluid levels. Progression of enteric contrast material from the small bowel into the colon is noted.  IMPRESSION:  1.  Interval improvement in small bowel obstruction.   Original Report Authenticated By: Angelita Ingles, M.D.    Dg Abd Acute W/chest  03/04/2012  *RADIOLOGY REPORT*  Clinical Data: Mid abdominal pain, small bowel obstruction, follow- up  ACUTE ABDOMEN SERIES (ABDOMEN 2 VIEW & CHEST 1 VIEW)  Comparison: Abdomen films of 03/03/2012 and chest x-ray of 03/02/2012  Findings: The lungs remain clear.  Mediastinal contours are stable. The heart is within normal limits in size.  There has been improvement in gaseous distention of small bowel consistent with improving partial small bowel obstruction.  Some contrast is noted throughout the nondistended colon and air is seen to the rectum.  No free air is seen.   IMPRESSION:  1.  Improvement in partial small bowel obstruction.  No free air. 2.  No active lung disease.   Original Report Authenticated By: Joretta Bachelor, M.D.    Dg Abd Acute W/chest  03/02/2012  *RADIOLOGY REPORT*  Clinical Data: Sudden onset upper abdominal pain and nausea.  ACUTE ABDOMEN SERIES (ABDOMEN 2 VIEW & CHEST 1 VIEW)  Comparison: Chest 06/27/2011, abdomen 11/10/2011.  Findings: Shallow inspiration.  Normal heart size and pulmonary vascularity.  No focal airspace consolidation in the lungs.  No blunting of costophrenic angles.  No pneumothorax.  Mediastinal contours appear intact.  No significant change since previous chest radiograph.  Dilated gas-filled mid abdominal small bowel loops with multiple air-fluid levels consistent with small bowel obstruction.  A paucity of gas in the colon without distension.  Surgical clips in the right upper quadrant.  No free intra-abdominal air.  No radiopaque stones.  Postoperative changes in the right lower quadrant.  Visualized bones appear intact.  IMPRESSION: No evidence of active pulmonary disease.  Gas filled dilated small bowel with air-fluid levels consistent with obstruction.   Original Report Authenticated By: Neale Burly, M.D.     Microbiology: Recent Results (from the past 240 hour(s))  MRSA PCR SCREENING     Status: Normal   Collection Time   03/02/12  6:22 PM      Component Value Range Status Comment   MRSA by PCR NEGATIVE  NEGATIVE Final      Labs: Basic Metabolic Panel:  Lab 24/46/28 0350 03/02/12 0329  NA 137 132*  K 3.9 3.5  CL 106 98  CO2 27 26  GLUCOSE 81 132*  BUN 6 7  CREATININE 0.60 0.57  CALCIUM 8.5 9.8  MG -- 1.9  PHOS -- 3.2   Liver Function Tests:  Lab 03/02/12 0329  AST 15  ALT 12  ALKPHOS 53  BILITOT 0.3  PROT 7.9  ALBUMIN 3.8   No results found for this basename: LIPASE:5,AMYLASE:5 in the last 168 hours No results found for this basename: AMMONIA:5 in the last 168 hours CBC:  Lab  03/04/12 0351 03/03/12 0350 03/02/12 0329  WBC 5.5 6.9 9.9  NEUTROABS -- -- 7.9*  HGB 9.4* 9.7* 11.9*  HCT 31.3* 30.9* 38.2  MCV 82.6 82.8 82.2  PLT 385 383 498*   Cardiac Enzymes: No results found for this basename: CKTOTAL:5,CKMB:5,CKMBINDEX:5,TROPONINI:5 in the last 168 hours BNP: BNP (last 3 results) No results found for this basename: PROBNP:3 in the last 8760 hours CBG: No results found for this basename: GLUCAP:5 in the last 168 hours  Time coordinating discharge: 35 minutes minutes  Signed:  Nita Sells  [facility] 03/05/2012, 4:43 PM

## 2012-03-07 ENCOUNTER — Encounter: Payer: Self-pay | Admitting: Family Medicine

## 2012-03-08 ENCOUNTER — Telehealth: Payer: Self-pay | Admitting: Internal Medicine

## 2012-03-08 NOTE — Telephone Encounter (Signed)
Patient was seen recently in the ER and was told she had a small bowel obstruction. She states that she left AMA because the doctor there was unreceptive to her problems and "was not taking care of me." She states that she is still having abdominal pain and has been out of work for 1 week. She has been scheduled to see [name], NP tomorrow, 03/09/12 @ 9 am in Dr Nichola Sizer absence. Patient verbalizes understanding.

## 2012-03-08 NOTE — Telephone Encounter (Signed)
Left message for patient to call back  

## 2012-03-09 ENCOUNTER — Encounter: Payer: Self-pay | Admitting: Nurse Practitioner

## 2012-03-09 ENCOUNTER — Ambulatory Visit (INDEPENDENT_AMBULATORY_CARE_PROVIDER_SITE_OTHER): Payer: 59 | Admitting: Nurse Practitioner

## 2012-03-09 ENCOUNTER — Encounter: Payer: Self-pay | Admitting: Internal Medicine

## 2012-03-09 VITALS — BP 120/80 | HR 70 | Ht 64.25 in | Wt 227.6 lb

## 2012-03-09 DIAGNOSIS — K56600 Partial intestinal obstruction, unspecified as to cause: Secondary | ICD-10-CM

## 2012-03-09 DIAGNOSIS — K5090 Crohn's disease, unspecified, without complications: Secondary | ICD-10-CM

## 2012-03-09 DIAGNOSIS — K56609 Unspecified intestinal obstruction, unspecified as to partial versus complete obstruction: Secondary | ICD-10-CM

## 2012-03-09 DIAGNOSIS — R110 Nausea: Secondary | ICD-10-CM

## 2012-03-09 DIAGNOSIS — R1010 Upper abdominal pain, unspecified: Secondary | ICD-10-CM

## 2012-03-09 DIAGNOSIS — R109 Unspecified abdominal pain: Secondary | ICD-10-CM

## 2012-03-09 MED ORDER — MOVIPREP 100 G PO SOLR: 1.0000 | Freq: Once | ORAL | Status: AC

## 2012-03-09 NOTE — Patient Instructions (Addendum)
We scheduled the Colonoscopy with Dr. [name]. We have given you a voucher for a free colonoscopy prep to take to your pharmacy. Follow the prep instructions. For now stay on full liquids.

## 2012-03-09 NOTE — Progress Notes (Addendum)
DAJANA GEHRIG [id number] December 20, 1990   HISTORY OR PRESENT ILLNESS :  [name] is a 21 year old female well known to Dr. [name] for history of Crohn's enterocolitis and duodenal Crohn's as well.   Patient has been maintained on 48m of 69mdaily and Cimzia.     JeSanaas hospitalized last week from Tuesday to Friday morning for a small bowel obstruction. CTscan revealed central abdominal loops of small bowel with apparent transition in the vicinity of the small bowel anastomosis. As on prior CT exam, there was surrounding  mesenteric edema as well as a mildly complex ascites. Patient left hospital AMA as she didn't feel enough effort was being put into her care. Over the weekend she had a fever of 102.  She still nauseated  But not having any vomiting. Her appetite is poor secondary to the nausea. She has constant mid to upper abdominal pain, similar which she describes as typical Crohn's-related pain. She's had 2 bowel movements since hospital discharge 4 days ago, this is not necessarily unusual for her. Her next cimzia he is due this Friday. Denies current fistula  Current Medications, Allergies, Past Medical History, Past Surgical History, Family History and Social History were reviewed in [employer].   PHYSICAL EXAMINATION : General:  Well developed  female in no acute distress Head: Normocephalic and atraumatic Eyes:  sclerae anicteric,conjunctive pink. Ears: Normal auditory acuity Neck: Supple, no masses.  Lungs: Clear throughout to auscultation Heart: Regular rate and rhythm; no murmurs heard Abdomen: Soft, nondistended, mild diffuse mid upper abdominal pain  No masses or hepatomegaly noted. Normal bowel sounds Musculoskeletal: Symmetrical with no gross deformities  Skin: No lesions on visible extremities Extremities: No edema or deformities noted Neurological: Oriented x 4, grossly nonfocal Cervical Nodes:  No significant cervical  adenopathy Psychological:  Alert and cooperative. Normal mood and affect  ASSESSMENT AND PLAN :  Crohns disease, small and large bowel complicated by abdominal and perirectal abscesses.  She is s/p ileocecectomy /diverting loop ileostomy and reversal January 2012. Patient has been on longterm antibiotics, currently on Keflex. She is maintained on cimzia and 69m369mPatient recently hospitialized with partial SBO. CTscan showed transition at anastomosis. Surgery evaluated, no surgical intervention done. She isn't feeling great, still get nauseated with meals.Her upper abdominal pain is still constant. I don't know if this is active Crohns. No evidence of active disease on EGD in late April nor on March 2012 colonoscopy. At this point patient will be schehduled for colonoscopy (possibly with dilation of what may be an anastomotic stricture). Patient has some nausea but feels it will not preclude prep. Procedure will be done by Dr. BroOlevia Perchesxt week. In meantime, patient should stay on full liquid, or at least low fiber diet for now.   Addendum: Reviewed and agree with initial management. Hopefully colonoscopy with evaluation of the anastomosis with help answer the question of whether the anastomosis has fixed stricture or active disease.  This information can guide management. JayJerene BearsD

## 2012-03-11 ENCOUNTER — Other Ambulatory Visit: Payer: Self-pay | Admitting: *Deleted

## 2012-03-11 DIAGNOSIS — K5010 Crohn's disease of large intestine without complications: Secondary | ICD-10-CM

## 2012-03-12 DIAGNOSIS — R1010 Upper abdominal pain, unspecified: Secondary | ICD-10-CM | POA: Insufficient documentation

## 2012-03-12 DIAGNOSIS — K56600 Partial intestinal obstruction, unspecified as to cause: Secondary | ICD-10-CM | POA: Insufficient documentation

## 2012-03-12 DIAGNOSIS — R1013 Epigastric pain: Secondary | ICD-10-CM | POA: Insufficient documentation

## 2012-03-16 ENCOUNTER — Other Ambulatory Visit: Payer: Self-pay | Admitting: Internal Medicine

## 2012-03-16 ENCOUNTER — Telehealth: Payer: Self-pay | Admitting: Internal Medicine

## 2012-03-16 NOTE — Telephone Encounter (Signed)
I have spoken to the pharmacy, they did not get the 02-16-12 script for phenergan. I have given a verbal okay for #30 with 1 refill.

## 2012-03-17 ENCOUNTER — Ambulatory Visit (AMBULATORY_SURGERY_CENTER): Payer: 59 | Admitting: Internal Medicine

## 2012-03-17 ENCOUNTER — Encounter: Payer: Self-pay | Admitting: *Deleted

## 2012-03-17 ENCOUNTER — Encounter: Payer: Self-pay | Admitting: Internal Medicine

## 2012-03-17 VITALS — BP 132/60 | HR 84 | Temp 97.2°F | Resp 23 | Ht 64.0 in | Wt 227.0 lb

## 2012-03-17 DIAGNOSIS — K5090 Crohn's disease, unspecified, without complications: Secondary | ICD-10-CM

## 2012-03-17 MED ORDER — SODIUM CHLORIDE 0.9 % IV SOLN: 500.0000 mL | INTRAVENOUS | Status: DC

## 2012-03-17 MED ORDER — BUDESONIDE 3 MG PO CP24: 9.0000 mg | ORAL_CAPSULE | ORAL | Status: DC

## 2012-03-17 MED ORDER — MERCAPTOPURINE 50 MG PO TABS: 100.0000 mg | ORAL_TABLET | Freq: Every day | ORAL | Status: AC

## 2012-03-17 NOTE — Patient Instructions (Addendum)
Per Dr. [name] await biopsy results.  Budesonide 9 mg daily.  Continue Cimzia and increase 6MP to 100 mg po daily.  Refill phenergan and zofran as needed.  The 3rd floor nurse will call you within next couple of days to set up follow up office visist with Dr. [name] for 3 weeks.  [facility] [address].  Please call if questions or concerns.     YOU HAD AN ENDOSCOPIC PROCEDURE TODAY AT [facility]: Refer to the procedure report that was given to you for any specific questions about what was found during the examination.  If the procedure report does not answer your questions, please call your gastroenterologist to clarify.  If you requested that your care partner not be given the details of your procedure findings, then the procedure report has been included in a sealed envelope for you to review at your convenience later.  YOU SHOULD EXPECT: Some feelings of bloating in the abdomen. Passage of more gas than usual.  Walking can help get rid of the air that was put into your GI tract during the procedure and reduce the bloating. If you had a lower endoscopy (such as a colonoscopy or flexible sigmoidoscopy) you may notice spotting of blood in your stool or on the toilet paper. If you underwent a bowel prep for your procedure, then you may not have a normal bowel movement for a few days.  DIET: Your first meal following the procedure should be a light meal and then it is ok to progress to your normal diet.  A half-sandwich or bowl of soup is an example of a good first meal.  Heavy or fried foods are harder to digest and may make you feel nauseous or bloated.  Likewise meals heavy in dairy and vegetables can cause extra gas to form and this can also increase the bloating.  Drink plenty of fluids but you should avoid alcoholic beverages for 24 hours.  ACTIVITY: Your care partner should take you home directly after the procedure.  You should plan to take it easy, moving slowly  for the rest of the day.  You can resume normal activity the day after the procedure however you should NOT DRIVE or use heavy machinery for 24 hours (because of the sedation medicines used during the test).    SYMPTOMS TO REPORT IMMEDIATELY: A gastroenterologist can be reached at any hour.  During normal business hours, 8:30 AM to 5:00 PM Monday through Friday, call [phone].  After hours and on weekends, please call the GI answering service at [phone] who will take a message and have the physician on call contact you.   Following lower endoscopy (colonoscopy or flexible sigmoidoscopy):  Excessive amounts of blood in the stool  Significant tenderness or worsening of abdominal pains  Swelling of the abdomen that is new, acute  Fever of 100F or higher    FOLLOW UP: If any biopsies were taken you will be contacted by phone or by letter within the next 1-3 weeks.  Call your gastroenterologist if you have not heard about the biopsies in 3 weeks.  Our staff will call the home number listed on your records the next business day following your procedure to check on you and address any questions or concerns that you may have at that time regarding the information given to you following your procedure. This is a courtesy call and so if there is no answer at the home number and  we have not heard from you through the emergency physician on call, we will assume that you have returned to your regular daily activities without incident.  SIGNATURES/CONFIDENTIALITY: You and/or your care partner have signed paperwork which will be entered into your electronic medical record.  These signatures attest to the fact that that the information above on your After Visit Summary has been reviewed and is understood.  Full responsibility of the confidentiality of this discharge information lies with you and/or your care-partner.

## 2012-03-17 NOTE — Progress Notes (Signed)
The pt was escorted the the restroom with her mother by her side and me on the other side.  Pt dressed in the restroom.  No complaints noted in the recovery room. Maw   Patient did not experience any of the following events: a burn prior to discharge; a fall within the facility; wrong site/side/patient/procedure/implant event; or a hospital transfer or hospital admission upon discharge from the facility. [phone]) Patient did not have preoperative order for IV antibiotic SSI prophylaxis. [phone])

## 2012-03-17 NOTE — Op Note (Signed)
[facility]  [address] [place], [id number]   COLONOSCOPY PROCEDURE REPORT  PATIENT: [name], [name]  MR#: [id number] BIRTHDATE: 08/30/1990 , 21  yrs. old GENDER: Female ENDOSCOPIST: Lafayette Dragon, MD REFERRED BY: PROCEDURE DATE:  03/17/2012 PROCEDURE:   Colonoscopy with biopsy ASA CLASS:   Class II INDICATIONS:an abnormal CT, CT scan shows distal SBO in the vicinity of ileo-colic anastomosis, last colon 09/2010 was normal,, she has had long standing aggressive Crohn'd disease, and follow up for previously diagnosed Crohn's disease. MEDICATIONS: MAC sedation, administered by CRNA and Propofol (Diprivan) 500 mg IV  DESCRIPTION OF PROCEDURE:   After the risks and benefits and of the procedure were explained, informed consent was obtained.  A digital rectal exam revealed no abnormalities of the rectum.    The LB [id number]-[id number]  endoscope was introduced through the anus and advanced to the  distal ileum through widely patent ieocolic anastomosis.  The quality of the prep was good, using MoviPrep . The instrument was then slowly withdrawn as the colon was fully examined.     COLON FINDINGS: There was evidence of a prior end-to-end ileocolonic surgical anastomosis.  Multiple biopsies of the area were performed.   A small bleeding ulcer was found in the terminal ileum., several aphthous ulcers  found on the ileal side of the anastomosis , Multiple biopsies of the area were performed.  Distal ileum appeared normal without evidence of a stricture         The scope was then withdrawn from the patient and the procedure completed.  COMPLICATIONS: There were no complications. ENDOSCOPIC IMPRESSION: 1.   There was evidence of a prior ileocolonic surgical anastomosis; multiple biopsies of the area were performed ,anastomosis is widely patent 2.   Small bleeding ulcer in the terminal ileum; multiple biopsies of the area were performed ,suspect Crohn's  ileatitis on the ileal side of the anastomosis  RECOMMENDATIONS: await pathology results Budesonide 9 mg daily continue Cimzia increase 6MP tp 100 mg po qd, refill Phenergan, Zofran as needed f/up OV 3 weeks   REPEAT EXAM: In 5 year(s)  for Colonoscopy.  cc:  _______________________________ eSignedLafayette Dragon, MD 03/17/2012 9:33 AM     PATIENT NAME:  [name], [name] MR#: [id number]

## 2012-03-17 NOTE — Progress Notes (Signed)
I encouraged the pt to pass flatus.  The pt said she felt like she "pooped" in the bed.  I pulled the curtain and checked the pt with her mother at the bedside, only yellow colored fluid was on her buttocks which I wiped clean with a baby wipe. Maw

## 2012-03-18 ENCOUNTER — Telehealth: Payer: Self-pay

## 2012-03-18 NOTE — Telephone Encounter (Signed)
Left message on answering machine. 

## 2012-03-19 ENCOUNTER — Encounter: Payer: 59 | Admitting: Internal Medicine

## 2012-03-22 ENCOUNTER — Telehealth: Payer: Self-pay | Admitting: Internal Medicine

## 2012-03-22 ENCOUNTER — Encounter: Payer: Self-pay | Admitting: Internal Medicine

## 2012-03-22 NOTE — Telephone Encounter (Signed)
Spoke with patient and moved her appointment to 04/16/12. She is asking about her papers from work. [name] has not received any papers. Patient will have them refaxed to [place].

## 2012-03-25 ENCOUNTER — Telehealth: Payer: Self-pay | Admitting: Internal Medicine

## 2012-03-25 NOTE — Telephone Encounter (Signed)
Pt says [name] told her that they sent an acknowledgement form to [place] today. I have advised her that we have not gotten any form today.

## 2012-03-26 ENCOUNTER — Encounter: Payer: Self-pay | Admitting: *Deleted

## 2012-03-29 ENCOUNTER — Other Ambulatory Visit: Payer: Self-pay | Admitting: *Deleted

## 2012-03-29 MED ORDER — LORAZEPAM 1 MG PO TABS: 1.0000 mg | ORAL_TABLET | Freq: Four times a day (QID) | ORAL | Status: DC | PRN

## 2012-03-30 ENCOUNTER — Telehealth: Payer: Self-pay | Admitting: Internal Medicine

## 2012-03-30 NOTE — Telephone Encounter (Signed)
Left a message for patient to call me. 

## 2012-03-30 NOTE — Telephone Encounter (Signed)
I have spoken to [place], she is much better now, she has been doing well till now, went back to work yesterday. She will call us this Friday am to update [place]  On her condition and to decide if she needs to extend her medical leave.

## 2012-03-30 NOTE — Telephone Encounter (Signed)
Spoke with patient and she states that last night when she opened her Cimzia she noticed that one of the syringes was gray in color and did not look right. She looked at the other syringe and it looked ok so she took it. Within 15 minutes she vomited x2. She took her Phenergan. Today she has had nausea but no vomiting. The injection site is unremarkable. Spoke with Sherrill Raring our Cimzia rep and he has not heard of this issue before but he is reporting it. He also requested patient keep the syringes for the lot numbers and to possibly return. Patient notified and she is contacting the company also.

## 2012-04-05 ENCOUNTER — Other Ambulatory Visit: Payer: Self-pay | Admitting: *Deleted

## 2012-04-05 MED ORDER — CERTOLIZUMAB PEGOL 2 X 200 MG/ML ~~LOC~~ KIT: 200.0000 mg | PACK | SUBCUTANEOUS | Status: DC

## 2012-04-13 ENCOUNTER — Ambulatory Visit: Payer: 59 | Admitting: Internal Medicine

## 2012-04-16 ENCOUNTER — Other Ambulatory Visit (INDEPENDENT_AMBULATORY_CARE_PROVIDER_SITE_OTHER): Payer: 59

## 2012-04-16 ENCOUNTER — Encounter: Payer: Self-pay | Admitting: Internal Medicine

## 2012-04-16 ENCOUNTER — Ambulatory Visit (INDEPENDENT_AMBULATORY_CARE_PROVIDER_SITE_OTHER): Payer: 59 | Admitting: Internal Medicine

## 2012-04-16 VITALS — BP 100/86 | HR 100 | Ht 64.0 in | Wt 233.2 lb

## 2012-04-16 DIAGNOSIS — K603 Anal fistula, unspecified: Secondary | ICD-10-CM

## 2012-04-16 DIAGNOSIS — K56609 Unspecified intestinal obstruction, unspecified as to partial versus complete obstruction: Secondary | ICD-10-CM

## 2012-04-16 DIAGNOSIS — K5090 Crohn's disease, unspecified, without complications: Secondary | ICD-10-CM

## 2012-04-16 DIAGNOSIS — K3184 Gastroparesis: Secondary | ICD-10-CM

## 2012-04-16 LAB — CBC WITH DIFFERENTIAL/PLATELET
Eosinophils Relative: 1.4 % (ref 0.0–5.0)
HCT: 33.8 % — ABNORMAL LOW (ref 36.0–46.0)
Lymphs Abs: 1.7 10*3/uL (ref 0.7–4.0)
Monocytes Relative: 3.9 % (ref 3.0–12.0)
Platelets: 551 10*3/uL — ABNORMAL HIGH (ref 150.0–400.0)
WBC: 5.8 10*3/uL (ref 4.5–10.5)

## 2012-04-16 LAB — COMPREHENSIVE METABOLIC PANEL
CO2: 29 mEq/L (ref 19–32)
Creatinine, Ser: 0.6 mg/dL (ref 0.4–1.2)
GFR: 162.16 mL/min (ref >60.00)
Glucose, Bld: 89 mg/dL (ref 70–99)
Total Bilirubin: 0.3 mg/dL (ref 0.3–1.2)
Total Protein: 7.3 g/dL (ref 6.0–8.3)

## 2012-04-16 LAB — SEDIMENTATION RATE: Sed Rate: 31 mm/hr — ABNORMAL HIGH (ref 0–22)

## 2012-04-16 MED ORDER — LORAZEPAM 1 MG PO TABS: 1.0000 mg | ORAL_TABLET | Freq: Four times a day (QID) | ORAL | Status: DC | PRN

## 2012-04-16 MED ORDER — PROMETHAZINE HCL 25 MG PO TABS: 25.0000 mg | ORAL_TABLET | ORAL | Status: DC | PRN

## 2012-04-16 MED ORDER — METOCLOPRAMIDE HCL 10 MG PO TABS: 10.0000 mg | ORAL_TABLET | ORAL | Status: DC

## 2012-04-16 MED ORDER — CIPROFLOXACIN HCL 250 MG PO TABS: 250.0000 mg | ORAL_TABLET | Freq: Every day | ORAL | Status: DC

## 2012-04-16 NOTE — Progress Notes (Signed)
[name] Apr 09, 1991 MRN [id number]  History of Present Illness:  This is a 21 year old African American female with complicated Crohn's disease of the duodenum, small bowel, colon and rectum. She is post recent colonoscopy on 03/17/2012 which showed aphthous ulcers at the ileo-colic anastomosis and in the terminal ileum. Biopsies confirmed ileitis and colitis with chronic minimally active inflammation. Her CT scan of the abdomen showed a partial small bowel obstruction which was not confirmed on a colonoscopy to the terminal ileum. She has been much improved on Entocort 9 mg daily, Cimzia every other week, and 6-MP 100 mg daily. She continues to have perirectal drainage from a fistula and sometimes a small amount of blood draining. Her main complaint is diarrhea, every other day and chronic nausea for which she takes Phenergan on a daily basis. She has early satiety and symptoms of poor gastric emptying. She takes Nexium on an as necessary basis.   Past Medical History  Diagnosis Date  . 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 Anxiety   . Candida esophagitis   . Ovarian cyst, left   . Anemia     iron def  . DVT (deep venous thrombosis) 08/2010    Left IJ (postop) anticoag x 3 mo  . SVT (supraventricular tachycardia)   . Depression   . Small bowel obstruction   . Acute renal failure   . MRSA (methicillin resistant Staphylococcus aureus)     multiple abscesses  . Allergy   . Blood transfusion   . Hypertension   . Perianal fistula    Past Surgical History  Procedure Date  . Cholecystectomy 2009  . Closure of ileostomy/loop 08/2010  . Colectomy with diverting loop ileostomy   . Appendectomy   . Esophagogastroduodenoscopy 11/11/2011    Procedure: ESOPHAGOGASTRODUODENOSCOPY (EGD);  Surgeon: Lafayette Dragon, MD;  Location: [facility];  Service: Endoscopy;  Laterality: N/A;  . Examination under anesthesia 11/12/2011    Procedure: EXAM  UNDER ANESTHESIA;  Surgeon: Rolm Bookbinder, MD;  Location: [facility];  Service: General;  Laterality: N/A;  . Proctoscopy 11/12/2011    Procedure: PROCTOSCOPY;  Surgeon: Rolm Bookbinder, MD;  Location: [facility];  Service: General;  Laterality: N/A;  . Incision and drainage perirectal abscess 11/12/2011    Procedure: IRRIGATION AND DEBRIDEMENT PERIRECTAL ABSCESS;  Surgeon: Rolm Bookbinder, MD;  Location: [facility];  Service: General;  Laterality: N/A;  . Incision and drainage perirectal abscess 12/11/2011    Procedure: IRRIGATION AND DEBRIDEMENT PERIRECTAL ABSCESS;  Surgeon: [name], MD;  Location: [facility];  Service: General;  Laterality: N/A;    reports that she has never smoked. She has never used smokeless tobacco. She reports that she does not drink alcohol or use illicit drugs. family history includes Breast cancer in her maternal grandmother and Diabetes in her cousin and maternal uncle.  There is no history of Colon cancer. Allergies  Allergen Reactions  . Iron Dextran Anaphylaxis  . Iron     REACTION: IV iron dextran        Review of Systems: Denies heartburn dysphagia denies significant abdominal pain  The remainder of the 10 point ROS is negative except as outlined in H&P   Physical Exam: General appearance  Well developed, in no distress. Eyes- non icteric. HEENT nontraumatic, normocephalic. Mouth no lesions, tongue papillated, no cheilosis. Neck supple without adenopathy, thyroid not enlarged, no carotid bruits, no JVD. Lungs Clear to auscultation bilaterally. Cor normal S1, normal  S2, regular rhythm, no murmur,  quiet precordium. Abdomen: Very tender epigastrium. Normal active bowel sounds some hyperactive rushes. No probable mass. Well-healed surgical scars. Tenderness of left lower quadrant.  Rectal: The draining fistula 2 cm away from the rectal opening at 9:00. Very tender. I have expressed some purulent material, no other lesions. Extremities no pedal edema. Skin no  lesions. Neurological alert and oriented x 3. Psychological normal mood and affect.  Assessment and Plan:  Problem #1 Crohn's disease of the duodenum, small bowel and the rectum with a draining anal fistula and symptoms of early satiety suggestive of gastroparesis. This has been present now for several months and may be related to partial small bowel obstruction which was demonstrated on a recent CT scan of the abdomen but not proven on the colonoscopy. We will obtain a gastric emptying scan to assess the gastric motility. We will also start her on Reglan 10 mg in the morning and this may be increased if she does not have any CNS side effects. She will continue all the other medications. She will also start Cipro 250 mg once a day. I will see her in 2 months. We are also checking her CBC and sedimentation rate today.    04/16/2012 [name]

## 2012-04-16 NOTE — Patient Instructions (Addendum)
You have been scheduled for a gastric emptying scan at 04-30-12 on Friday at 8 am. Please arrive at least 15 minutes prior to your appointment for registration. Please make certain not to have anything to eat or drink at least 8 hours prior to your test. Hold all stomach medications (ex: Zofran, phenergan, Reglan) 48 hours prior to your test. If you need to reschedule your appointment, please contact radiology scheduling at [phone]. Your physician has requested that you go to the basement for the following lab work before leaving today: CBC and sed rate. We have sent the following medications to your pharmacy for you to pick up at your convenience: Reglan, Cipro, Phenergan and Ativan. We are working on a letter for your lawyer to help with FMLA.

## 2012-04-23 ENCOUNTER — Other Ambulatory Visit: Payer: Self-pay | Admitting: *Deleted

## 2012-04-23 DIAGNOSIS — R197 Diarrhea, unspecified: Secondary | ICD-10-CM

## 2012-04-23 MED ORDER — METRONIDAZOLE 250 MG PO TABS: 250.0000 mg | ORAL_TABLET | Freq: Three times a day (TID) | ORAL | Status: DC

## 2012-04-23 NOTE — Telephone Encounter (Signed)
Per Dr [name], she would like patient to continue all medications and add Flagyl 250 mg tid x 10 days. She would also like patient to come Monday for CBC, C Diff by PCR. I have spoken to patient and advised her of both. Dr [name] has also okayed work notes for patient.

## 2012-04-23 NOTE — Telephone Encounter (Signed)
Called patient to let her know that we have her letter ready for her lawyer. Patient states that she is again having mid abdominal pain as well as large amounts of blood in the stool. Stool is loose. She had a fever of 103F Tuesday and again Thursday night. She has also been having night sweats. Minimal nausea. She is currently on Entocort, 6MP, Cimzia and Cipro 250 mg twice daily as well as phenergan. Patient states that she also needs a work note for this week and a note saying that she needs additional bathroom breaks. Dr [name], please advise.

## 2012-04-26 ENCOUNTER — Telehealth: Payer: Self-pay | Admitting: Internal Medicine

## 2012-04-26 ENCOUNTER — Other Ambulatory Visit (INDEPENDENT_AMBULATORY_CARE_PROVIDER_SITE_OTHER): Payer: 59

## 2012-04-26 DIAGNOSIS — R197 Diarrhea, unspecified: Secondary | ICD-10-CM

## 2012-04-26 LAB — CBC WITH DIFFERENTIAL/PLATELET
Eosinophils Relative: 0.7 % (ref 0.0–5.0)
HCT: 33.6 % — ABNORMAL LOW (ref 36.0–46.0)
Lymphs Abs: 2 10*3/uL (ref 0.7–4.0)
MCV: 84.9 fl (ref 78.0–100.0)
Monocytes Absolute: 0.3 10*3/uL (ref 0.1–1.0)
Platelets: 570 10*3/uL — ABNORMAL HIGH (ref 150.0–400.0)
RDW: 19.8 % — ABNORMAL HIGH (ref 11.5–14.6)
WBC: 5.7 10*3/uL (ref 4.5–10.5)

## 2012-04-26 NOTE — Telephone Encounter (Signed)
Letters have been created and are awaiting Dr Nichola Sizer signature. Patient advised.

## 2012-04-28 ENCOUNTER — Other Ambulatory Visit: Payer: 59

## 2012-04-28 DIAGNOSIS — R197 Diarrhea, unspecified: Secondary | ICD-10-CM

## 2012-04-29 ENCOUNTER — Telehealth: Payer: Self-pay | Admitting: Internal Medicine

## 2012-04-29 LAB — CLOSTRIDIUM DIFFICILE BY PCR: Toxigenic C. Difficile by PCR: DETECTED — CR

## 2012-04-29 MED ORDER — SACCHAROMYCES BOULARDII 250 MG PO CAPS: 250.0000 mg | ORAL_CAPSULE | Freq: Two times a day (BID) | ORAL | Status: DC

## 2012-04-29 NOTE — Telephone Encounter (Signed)
Received a call from lab that patient is positive for C. Diff. Per Nicoletta Ba, PA : Vancomycin 250 mg QID x 14 days.(Amy felt she should be on extended dose due to  immunosuppression)  Hold Cipro for now. Florastor BID. Hold Cimzia this week if due this week and call us next week before taking next dose. Patient notified. Rx called to [facility].

## 2012-04-29 NOTE — Telephone Encounter (Signed)
Reviewed and agree.

## 2012-04-29 NOTE — Telephone Encounter (Signed)
Gave patient instructions for GES tomorrow.

## 2012-04-30 ENCOUNTER — Encounter (HOSPITAL_COMMUNITY)
Admission: RE | Admit: 2012-04-30 | Discharge: 2012-04-30 | Disposition: A | Discharge status: Home / Self Care | Payer: 59 | Source: Ambulatory Visit | Attending: Internal Medicine | Admitting: Internal Medicine

## 2012-04-30 ENCOUNTER — Encounter (HOSPITAL_COMMUNITY): Payer: Self-pay

## 2012-04-30 DIAGNOSIS — K5090 Crohn's disease, unspecified, without complications: Secondary | ICD-10-CM

## 2012-04-30 DIAGNOSIS — K3184 Gastroparesis: Secondary | ICD-10-CM

## 2012-04-30 DIAGNOSIS — R1013 Epigastric pain: Secondary | ICD-10-CM | POA: Insufficient documentation

## 2012-04-30 DIAGNOSIS — K3189 Other diseases of stomach and duodenum: Secondary | ICD-10-CM | POA: Insufficient documentation

## 2012-04-30 MED ORDER — TECHNETIUM TC 99M SULFUR COLLOID
2.2000 | Freq: Once | INTRAVENOUS | Status: AC | PRN
  Administered 2012-04-30: 2.2 via INTRAVENOUS

## 2012-05-06 ENCOUNTER — Telehealth: Payer: Self-pay | Admitting: *Deleted

## 2012-05-06 DIAGNOSIS — R197 Diarrhea, unspecified: Secondary | ICD-10-CM

## 2012-05-06 NOTE — Telephone Encounter (Signed)
Patient c/o continued bloody diarrhea (4-5x daily, sometimes more). She has not noticed any improvement in her symptoms after taking 1 week of vancomycin. She has an additional week of vancomycin to take. Any additional recommendations?

## 2012-05-07 NOTE — Telephone Encounter (Signed)
Please order another stool for C.Diff by PCR

## 2012-05-10 ENCOUNTER — Other Ambulatory Visit: Payer: 59

## 2012-05-10 ENCOUNTER — Telehealth: Payer: Self-pay | Admitting: Internal Medicine

## 2012-05-10 DIAGNOSIS — R197 Diarrhea, unspecified: Secondary | ICD-10-CM

## 2012-05-10 NOTE — Telephone Encounter (Signed)
Spoke with patient and she will come by and pick up stool study containers.

## 2012-05-10 NOTE — Telephone Encounter (Signed)
Spoke with Judson Roch and told her I need a signed release from patient before I can speak with her.

## 2012-05-11 LAB — CLOSTRIDIUM DIFFICILE BY PCR: Toxigenic C. Difficile by PCR: NOT DETECTED

## 2012-05-18 ENCOUNTER — Encounter: Payer: Self-pay | Admitting: Obstetrics and Gynecology

## 2012-05-18 ENCOUNTER — Ambulatory Visit (INDEPENDENT_AMBULATORY_CARE_PROVIDER_SITE_OTHER): Payer: 59 | Admitting: Obstetrics and Gynecology

## 2012-05-18 VITALS — BP 122/74 | Temp 98.4°F | Wt 232.0 lb

## 2012-05-18 DIAGNOSIS — R109 Unspecified abdominal pain: Secondary | ICD-10-CM

## 2012-05-18 DIAGNOSIS — K5090 Crohn's disease, unspecified, without complications: Secondary | ICD-10-CM

## 2012-05-18 DIAGNOSIS — N644 Mastodynia: Secondary | ICD-10-CM

## 2012-05-18 DIAGNOSIS — K612 Anorectal abscess: Secondary | ICD-10-CM

## 2012-05-18 DIAGNOSIS — N63 Unspecified lump in unspecified breast: Secondary | ICD-10-CM

## 2012-05-18 NOTE — Progress Notes (Signed)
21 YO complains of itching in both breast x 2 months and pain, swelling and itching in right breast x 2 weeks.  Patient reports that her left breast use to be larger than the right (from a C-cup to now a D-cup) but now that is reverse.  Denies nipple discharge, trauma,rash,  upper body exercise, family history of breast issues, chest pain, shortness of breath or cough.  Just began Cimzia for Crohn's in August and recently treated x 14 days with Vancomycin for C. difficile. Denies caffeine,  excess salt consumption, change in bras or laundry detergent.  PMH: Crohn's Dz., DVT  Menses flow 5.5 days, change pad every 4-6 hours with no cramps;  In either August or September had only a 3 day period with pad change every 4 hours but no cramps.  O: Neck: supple no masses or thyromegaly, no clavicular adenopathy      LUNGS-clear no wheezes, rales or rhonchi      HEART:  RRR no murmur      Breasts:  Right obviously larger than left with no skin changes, rash, mildly tender with no focal nodules on either side, no dimpling, nipple discharge      or retractions and no axillary adenopathy.  UPT-unable to void  A:  Bilateral Breast Pain/Itching      Right Breast Enlargement  P: Prolactin, BHCG-pending (patient to have drawn in 4 days at [facility] on [address])      Consulted Dr. Charlesetta Garibaldi who recommended Vistaril for itching and follow up in 4 weeks      F/U with PCP about possible drug induced symptoms     RTO-4 weeks follow up with Dr. Delight Stare, PA-C

## 2012-05-20 ENCOUNTER — Telehealth: Payer: Self-pay | Admitting: Internal Medicine

## 2012-05-20 NOTE — Telephone Encounter (Signed)
Spoke with patient and she is calling to report breast changes. She has seen her OB/GYN and was told to ask if the Cimiza could be causing the changes. She has had right breast enlargement. She has also had itching, swelling and redness of both breasts. Please, advise.

## 2012-05-21 NOTE — Telephone Encounter (Signed)
Spoke with patient and she is taking Reglan. Informed her this can cause the breast changes.

## 2012-05-21 NOTE — Telephone Encounter (Signed)
Breast enlargement is not a common side effect of Cimzia ( only 2 patients out of thousand), but Nexiem is listed and so is BCPills .Humira was listed as having breast symptoms as uncommon sideffect..Pregnancy certainly?? Hope she is not pregnant.

## 2012-05-21 NOTE — Telephone Encounter (Signed)
I will have to look it up.Reglan can do that ( it is listed in her meds)

## 2012-05-21 NOTE — Telephone Encounter (Signed)
Spoke with patient and let her know about Dr. Nichola Sizer findings. She states she is not pregnant.

## 2012-05-22 LAB — HCG, QUANTITATIVE, PREGNANCY: hCG, Beta Chain, Quant, S: <2 m[IU]/mL

## 2012-05-24 ENCOUNTER — Telehealth: Payer: Self-pay | Admitting: Internal Medicine

## 2012-05-24 NOTE — Telephone Encounter (Signed)
Patient states that her FMLA was denied because we never sent labwork or office notes over from where she had C DIFF. I have advised that actually, we sent all records to them on at least 3 occasions. They were sent on the 2nd time 04/30/12 and again 05/12/12 (which I have confirmation of). I will call and speak to patient's representative, Adam Phenix at [phone].

## 2012-05-24 NOTE — Telephone Encounter (Signed)
Left message for [name] to call back as we have sent the information requested on at least 3 occasions.

## 2012-05-26 NOTE — Telephone Encounter (Signed)
Left message for Bubba Hales on 05/24/17 @ 4:55 pm. I left another message @ 11:32 am 05/26/12 as I never received a return phone call. Hopefully we will hear back from her soon to get the issue resolved.

## 2012-05-27 NOTE — Telephone Encounter (Signed)
Patient states that Breana's name is now [employer]. She has been in touch with [employer] as well to let them know that we are trying to contact them.

## 2012-06-01 NOTE — Telephone Encounter (Signed)
Left message for patient that I still have yet to hear from [place] 2 voicemails left on her machine. I have asked that patient call back if we can be of any help in an appeal process etc as I do have the info and confirmation that info was sent to [place] when they originally requested the information.

## 2012-06-02 ENCOUNTER — Encounter: Payer: Self-pay | Admitting: Internal Medicine

## 2012-06-14 ENCOUNTER — Other Ambulatory Visit: Payer: Self-pay | Admitting: Internal Medicine

## 2012-06-14 ENCOUNTER — Encounter: Payer: Self-pay | Admitting: *Deleted

## 2012-06-14 MED ORDER — OXYCODONE-ACETAMINOPHEN 5-325 MG PO TABS: 1.0000 | ORAL_TABLET | ORAL | Status: DC | PRN

## 2012-06-14 NOTE — Telephone Encounter (Signed)
Advised patient that Percocet rx will be placed at front desk for her. Patient is scheduled for an office visit on 06/16/12.

## 2012-06-16 ENCOUNTER — Encounter: Payer: Self-pay | Admitting: Internal Medicine

## 2012-06-16 ENCOUNTER — Ambulatory Visit (INDEPENDENT_AMBULATORY_CARE_PROVIDER_SITE_OTHER): Payer: 59 | Admitting: Internal Medicine

## 2012-06-16 VITALS — BP 108/70 | HR 72 | Ht 64.0 in | Wt 233.0 lb

## 2012-06-16 DIAGNOSIS — D849 Immunodeficiency, unspecified: Secondary | ICD-10-CM

## 2012-06-16 DIAGNOSIS — K603 Anal fistula: Secondary | ICD-10-CM

## 2012-06-16 DIAGNOSIS — K5010 Crohn's disease of large intestine without complications: Secondary | ICD-10-CM

## 2012-06-16 DIAGNOSIS — Z79899 Other long term (current) drug therapy: Secondary | ICD-10-CM

## 2012-06-16 MED ORDER — OMEPRAZOLE 20 MG PO CPDR: 20.0000 mg | DELAYED_RELEASE_CAPSULE | Freq: Two times a day (BID) | ORAL | Status: DC

## 2012-06-16 MED ORDER — METRONIDAZOLE 250 MG PO TABS: 250.0000 mg | ORAL_TABLET | Freq: Two times a day (BID) | ORAL | Status: DC

## 2012-06-16 NOTE — Progress Notes (Signed)
[name] 12/31/1990 MRN [id number]  History of Present Illness:  This is a 21 year old African American female with complicated Crohn's disease involving the small bowel, duodenum and rectum. She has recovered from pseudomembranous colitis. She underwent a terminal ileal resection and takedown of ileostomy in February 2012. She currently has a small amount of drainage from a rectal abscess. She has gastroparesis documented on a gastric emptying scan in October 2013. She had 6% retention in 2 hours. Her symptoms include nausea, abdominal pain and diarrhea. She has continued on Cimzia every 2 weeks, 6-MP 100 mg daily and Entocort 9 mg daily. She denies fever or rectal bleeding. She has a pending application for a disability court date on 06/28/2012.   Past Medical History  Diagnosis Date  . 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 Anxiety   . Candida esophagitis   . Ovarian cyst, left   . Anemia     iron def  . DVT (deep venous thrombosis) 08/2010    Left IJ (postop) anticoag x 3 mo  . SVT (supraventricular tachycardia)   . Depression   . Small bowel obstruction   . Acute renal failure   . MRSA (methicillin resistant Staphylococcus aureus)     multiple abscesses  . Allergy   . Blood transfusion   . Hypertension   . Perianal fistula   . 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 Gastroparesis    Past Surgical History  Procedure Date  . Cholecystectomy 2009  . Closure of ileostomy/loop 08/2010  . Colectomy with diverting loop ileostomy   . Appendectomy   . Esophagogastroduodenoscopy 11/11/2011    Procedure: ESOPHAGOGASTRODUODENOSCOPY (EGD);  Surgeon: Lafayette Dragon, MD;  Location: [facility];  Service: Endoscopy;  Laterality: N/A;  . Examination under anesthesia 11/12/2011    Procedure: EXAM UNDER ANESTHESIA;  Surgeon: Rolm Bookbinder, MD;  Location: [facility];  Service: General;  Laterality: N/A;  . Proctoscopy  11/12/2011    Procedure: PROCTOSCOPY;  Surgeon: Rolm Bookbinder, MD;  Location: [facility];  Service: General;  Laterality: N/A;  . Incision and drainage perirectal abscess 11/12/2011    Procedure: IRRIGATION AND DEBRIDEMENT PERIRECTAL ABSCESS;  Surgeon: Rolm Bookbinder, MD;  Location: [facility];  Service: General;  Laterality: N/A;  . Incision and drainage perirectal abscess 12/11/2011    Procedure: IRRIGATION AND DEBRIDEMENT PERIRECTAL ABSCESS;  Surgeon: [name], MD;  Location: [facility];  Service: General;  Laterality: N/A;    reports that she has never smoked. She has never used smokeless tobacco. She reports that she does not drink alcohol or use illicit drugs. family history includes Diabetes in her cousin and maternal uncle; Emphysema in her maternal grandmother; Heart attack in her maternal grandmother; Heart disease in her maternal grandmother; and Sickle cell trait in her father.  There is no history of Colon cancer. Allergies  Allergen Reactions  . Iron Dextran Anaphylaxis  . Iron     REACTION: IV iron dextran        Review of Systems: Denies dysphagia odynophagia. Positive for nausea no vomiting. Weight stable  The remainder of the 10 point ROS is negative except as outlined in H&P   Physical Exam: General appearance  Well developed, in no distress. Overweight appears cushingoid Eyes- non icteric. HEENT nontraumatic, normocephalic. Mouth no lesions, tongue papillated, no cheilosis. Neck supple without adenopathy, thyroid not enlarged, no carotid bruits, no JVD. Lungs Clear to auscultation  bilaterally. Cor normal S1, normal S2, regular rhythm, no murmur,  quiet precordium. Abdomen: Soft, obese diffusely tender in all quadrants. Well-healed surgical scars. No fistula.  Rectal: Small amount of drainage from the perianal fistula within 1 inch of the anal opening. It's tender but not fluctuant. Extremities no pedal edema. Skin no lesions. Neurological alert and oriented x  3. Psychological normal mood and affect.  Assessment and Plan:  Problem #1 Active Crohn's disease of the small bowel, terminal ileum and rectum. Patient has been on maximum medical therapy. We will add Flagyl 250 mg twice a day. We are supporting her application for disability. She is  chronically immunosuppressed but shows no evidence of ongoing infection. We will refill her omeprazole. Her next office visit will be due in 8 weeks.  06/16/2012 [name]

## 2012-06-16 NOTE — Patient Instructions (Addendum)
We have sent the following medications to your pharmacy for you to pick up at your convenience: Flagyl  Omeprazole  Please follow up with Dr [name] in 8 weeks.  Dr Asa Lente

## 2012-06-17 ENCOUNTER — Telehealth: Payer: Self-pay | Admitting: Internal Medicine

## 2012-06-17 NOTE — Telephone Encounter (Signed)
Left message for patient to call back  

## 2012-06-17 NOTE — Telephone Encounter (Signed)
Dr. [name] do you want Yarelin to have a flu vaccine

## 2012-06-17 NOTE — Telephone Encounter (Signed)
OK to give a flu shot

## 2012-06-17 NOTE — Telephone Encounter (Signed)
I have left a message for [name] as she requested on her cell about Dr. Nichola Sizer response.  She wants to wait a few weeks and she will call back to schedule

## 2012-06-22 ENCOUNTER — Telehealth: Payer: Self-pay | Admitting: Internal Medicine

## 2012-06-22 NOTE — Telephone Encounter (Signed)
It was denied. I have spoken to patient and she is to contact her insurance company to find out what they want her to take.

## 2012-06-28 ENCOUNTER — Encounter: Payer: Self-pay | Admitting: Obstetrics and Gynecology

## 2012-06-28 ENCOUNTER — Ambulatory Visit (INDEPENDENT_AMBULATORY_CARE_PROVIDER_SITE_OTHER): Payer: 59 | Admitting: Obstetrics and Gynecology

## 2012-06-28 VITALS — BP 120/66 | Temp 99.1°F | Wt 238.0 lb

## 2012-06-28 DIAGNOSIS — N649 Disorder of breast, unspecified: Secondary | ICD-10-CM

## 2012-06-28 NOTE — Progress Notes (Signed)
BP 120/66  Temp 99.1 F (37.3 C)  Wt 238 lb (107.956 kg)  LMP 06/10/2012 Pt states the breast itching is better after using cocoa butter.  The right breast is the same size.  No nipple d/c or masses Physical Examination: General appearance - alert, well appearing, and in no distress Chest - clear to auscultation, no wheezes, rales or rhonchi, symmetric air entry Heart - normal rate and regular rhythm Abdomen - soft, nontender, nondistended, no masses or organomegaly Breasts - breasts appear normal, no suspicious masses, no skin or nipple changes or axillary nodes, right breast is slightly larger than the left Breast Itching Now improving Pt declined consult with surgeon or breast center RT for AEX.  Call PRN

## 2012-06-28 NOTE — Patient Instructions (Signed)
Breast Self-Exam A self breast exam may help you find changes or problems while they are still small. Do a breast self-exam:  Every month.  One week after your period (menstrual period).  On the first day of each month if you do not have periods anymore. Look for any:  Change in breast color, size, or shape.  Dimples in your breast.  Changes in your nipples or skin.  Dry skin on your breasts or nipples.  Watery or bloody discharge from your nipples.  Feel for:  Lumps.  Thick, hard places.  Any other changes. HOME CARE There are 3 ways to do the breast self-exam: In front of a mirror.  Lift your arms over your head and turn side to side.  Put your hands on your hips and lean down, then turn from side to side.  Bend forward and turn from side to side. In the shower.  With soapy hands, check both breasts. Then check above and below your collarbone and your armpits.  Feel above and below your collarbone down to under your breast, and from the center of your chest to the outer edge of the armpit. Check for any lumps or hard spots.  Using the tips of your middle three fingers check your whole breast by pressing your hand over your breast in a circle or in an up and down motion. Lying down.  Lie flat on your bed.  Put a small pillow under the breast you are going to check. On that same side, put your hand behind your head.  With your other hand, use the 3 middle fingers to feel the breast.  Move your fingers in a circle around the breast. Press firmly over all parts of the breast to feel for any lumps. GET HELP RIGHT AWAY IF: You find any changes in your breasts so they can be checked. Document Released: 12/17/2007 Document Revised: 09/22/2011 Document Reviewed: 10/18/2008 [facility] Patient Information 2013 [place].

## 2012-07-06 ENCOUNTER — Other Ambulatory Visit: Payer: Self-pay | Admitting: Internal Medicine

## 2012-07-06 MED ORDER — ACYCLOVIR 800 MG PO TABS: ORAL_TABLET | ORAL | Status: DC

## 2012-07-06 MED ORDER — PROMETHAZINE HCL 25 MG PO TABS: 25.0000 mg | ORAL_TABLET | ORAL | Status: DC | PRN

## 2012-07-09 ENCOUNTER — Telehealth: Payer: Self-pay | Admitting: *Deleted

## 2012-07-09 NOTE — Telephone Encounter (Signed)
Patient states that she is having periumbilical abdominal pain since last Thursday. Pain is constant unless she takes Roxicet, which then aleviates the pain.  No change in bowels. Has had a fever of 103 as of this morning (also had one earlier in the week). There has been no blood in the stool, no vomiting. Patient is currently taking Flagyl, Cimzia (q2weeks), 72m and Entocort. Dr BOlevia Perches please advise..Marland KitchenMarland KitchenMarland Kitchen

## 2012-07-09 NOTE — Telephone Encounter (Signed)
I have spoken to the pt, she is sick with fever, not eating well, constipated. She will take Cipro 563m, po bid which she has at home, #14 left, also she will take MOM 45 cc tonight. Please refill Roxicet

## 2012-07-12 MED ORDER — OXYCODONE-ACETAMINOPHEN 5-325 MG PO TABS
1.0000 | ORAL_TABLET | ORAL | Status: DC | PRN
Start: 2012-07-12 — End: 2012-12-29

## 2012-07-12 NOTE — Telephone Encounter (Signed)
Rx created and will be signed by Nicoletta Ba, PA-C in Dr Nichola Sizer absence from the office (she is currently hospital Dr).

## 2012-07-23 ENCOUNTER — Telehealth: Payer: Self-pay | Admitting: Internal Medicine

## 2012-07-23 ENCOUNTER — Encounter: Payer: Self-pay | Admitting: Gastroenterology

## 2012-07-23 ENCOUNTER — Telehealth: Payer: Self-pay | Admitting: Gastroenterology

## 2012-07-23 ENCOUNTER — Other Ambulatory Visit (INDEPENDENT_AMBULATORY_CARE_PROVIDER_SITE_OTHER): Payer: 59

## 2012-07-23 ENCOUNTER — Ambulatory Visit (INDEPENDENT_AMBULATORY_CARE_PROVIDER_SITE_OTHER): Payer: 59 | Admitting: Gastroenterology

## 2012-07-23 VITALS — BP 128/86 | HR 68 | Ht 64.0 in | Wt 227.8 lb

## 2012-07-23 DIAGNOSIS — K5090 Crohn's disease, unspecified, without complications: Secondary | ICD-10-CM

## 2012-07-23 DIAGNOSIS — R509 Fever, unspecified: Secondary | ICD-10-CM

## 2012-07-23 LAB — COMPREHENSIVE METABOLIC PANEL
CO2: 29 mEq/L (ref 19–32)
Calcium: 9.4 mg/dL (ref 8.4–10.5)
Chloride: 105 mEq/L (ref 96–112)
Creatinine, Ser: 0.7 mg/dL (ref 0.4–1.2)
GFR: 139.71 mL/min (ref >60.00)
Glucose, Bld: 88 mg/dL (ref 70–99)
Total Bilirubin: 0.4 mg/dL (ref 0.3–1.2)

## 2012-07-23 LAB — CBC WITH DIFFERENTIAL/PLATELET
Basophils Relative: 0.5 % (ref 0.0–3.0)
Eosinophils Relative: 2.9 % (ref 0.0–5.0)
HCT: 36.1 % (ref 36.0–46.0)
Hemoglobin: 11.5 g/dL — ABNORMAL LOW (ref 12.0–15.0)
Lymphocytes Relative: 29.4 % (ref 12.0–46.0)
Lymphs Abs: 1.7 10*3/uL (ref 0.7–4.0)
Monocytes Relative: 7 % (ref 3.0–12.0)
Neutro Abs: 3.4 10*3/uL (ref 1.4–7.7)
RBC: 4.33 Mil/uL (ref 3.87–5.11)
WBC: 5.7 10*3/uL (ref 4.5–10.5)

## 2012-07-23 MED ORDER — PROMETHAZINE HCL 25 MG PO TABS: ORAL_TABLET | ORAL | Status: DC

## 2012-07-23 NOTE — Progress Notes (Signed)
07/23/2012 [name] [id number] 1990/10/24   History of Present Illness: This is a 22 year old African American female with complicated Crohn's disease involving the small bowel, duodenum and rectum.  She comes in today with complaint of fever.  Says that her temp was 103 two days ago then 102 yesterday, but was down to 100 today.  She is on Cimzia every 2 weeks, 6-MP 100 mg daily and Entocort 9 mg daily.  Is due for Cimzia today.  Has nausea and some mid-abdominal pain, but that is usual for her.  Has a small amount of drainage from a rectal fistula, but says that it is even less than it was at her last visit.  No rectal bleeding or diarrhea.  In fact, she denies really any new or changed GI symptoms.  Denies cough, sore throat, body aches (says that her back just hurt a little), urinary symptoms, recent herpes flare.  Says that her appetite has not been great so is not eating much, but drinking a lot of fluids.  CBC from today looks good.   Current Medications, Allergies, Past Medical History, Past Surgical History, Family History and Social History were reviewed in [employer] record.   Physical Exam: BP 128/86  Pulse 68  Ht 5' 4"  (1.626 m)  Wt 227 lb 12.8 oz (103.329 kg)  BMI 39.10 kg/m2  LMP 06/10/2012 General: Well developed, black female in no acute distress Head: Normocephalic and atraumatic Eyes:  sclerae anicteric, conjunctiva pink  Ears: Normal auditory acuity Neck:  No lymphadenopathy Lungs: Clear throughout to auscultation Heart: Regular rate and rhythm Abdomen: Soft, non-distended. Multiple incisional scars and previous ostomy scar noted.  No masses, no hepatomegaly.  BS present, but quiet/distant.  TTP at right mid-abdomen without rebound or involuntary guarding. Rectal: Deferred. Musculoskeletal: Symmetrical with no gross deformities  Extremities: No edema  Neurological: Alert oriented x 4, grossly nonfocal Psychological:  Alert and  cooperative. Normal mood and affect  Assessment and Recommendations: -Fever:  Unknown source.  No identifiable source. -Severe, complicated Crohn's disease on immunosuppressants. *We will await the remaining labs. *Patient will hold her Cimzia dose today and her next dose in two weeks. *Monitor her temps and call back if fever persists or increases.  Call if develops any other new symptoms.

## 2012-07-23 NOTE — Progress Notes (Signed)
Discussed with Ms [name], agree with plans to hold Cimzia. Consider adding low dose Flagyl .

## 2012-07-23 NOTE — Patient Instructions (Addendum)
Hold Cimzia Today and next dose Monitor temperatures Call us back if you you continue with high temperatures

## 2012-07-23 NOTE — Telephone Encounter (Signed)
Patient calling to report fever x 2 days. States temp was 103 on Wed., 102 on Thursday and 100 today. She states she is achy. She is having nausea but no vomiting. States the nausea is her usual nausea. She is taking her pain meds and states she is not having any unusual pain. Denies diarrhea or cramping. States she has not had a bowel movement in 2 days.  Not sleeping much. Did not have a flu shot this year. Per Dr. [name], CBC, CMET today and see extender. Patient scheduled with Alonza Bogus, PA at 2:30 PM. Labs in [facility]. Patient aware.

## 2012-07-23 NOTE — Telephone Encounter (Signed)
Ok per Dr [name] to give patient #40 promethazine per month. Rx must last 1 full month.

## 2012-07-26 ENCOUNTER — Telehealth: Payer: Self-pay | Admitting: Obstetrics and Gynecology

## 2012-07-26 ENCOUNTER — Other Ambulatory Visit: Payer: Self-pay | Admitting: *Deleted

## 2012-07-26 MED ORDER — PROMETHAZINE HCL 25 MG PO TABS: ORAL_TABLET | ORAL | Status: DC

## 2012-07-27 ENCOUNTER — Encounter: Payer: Self-pay | Admitting: Obstetrics and Gynecology

## 2012-07-27 ENCOUNTER — Other Ambulatory Visit: Payer: Self-pay | Admitting: *Deleted

## 2012-07-27 ENCOUNTER — Ambulatory Visit (INDEPENDENT_AMBULATORY_CARE_PROVIDER_SITE_OTHER): Payer: 59 | Admitting: Obstetrics and Gynecology

## 2012-07-27 VITALS — BP 132/72 | Ht 64.0 in | Wt 221.0 lb

## 2012-07-27 DIAGNOSIS — Z113 Encounter for screening for infections with a predominantly sexual mode of transmission: Secondary | ICD-10-CM

## 2012-07-27 DIAGNOSIS — N760 Acute vaginitis: Secondary | ICD-10-CM

## 2012-07-27 DIAGNOSIS — A499 Bacterial infection, unspecified: Secondary | ICD-10-CM

## 2012-07-27 DIAGNOSIS — Z7251 High risk heterosexual behavior: Secondary | ICD-10-CM

## 2012-07-27 MED ORDER — LORAZEPAM 1 MG PO TABS: 1.0000 mg | ORAL_TABLET | Freq: Four times a day (QID) | ORAL | Status: DC | PRN

## 2012-07-27 NOTE — Progress Notes (Signed)
Pt here for STD testing.  Her boyfriend cheated on her and she wants to be checked for all STDS except herpes.  She has been dxd with HSV 2.   BP 132/72  Ht 5' 4"  (1.626 m)  Wt 221 lb (100.245 kg)  BMI 37.93 kg/m2  LMP 06/28/2012 Physical Examination: General appearance - alert, well appearing, and in no distress Pelvic - normal external genitalia, vulva, vagina, cervix, uterus and adnexa, VULVA: normal appearing vulva with no masses, tenderness or lesions, VAGINA: normal appearing vagina with normal color and discharge, no lesions, vaginal discharge - creamy and malodorous, WET MOUNT done - results: clue cells, vaginal pH is 5.5, CERVIX: normal appearing cervix without discharge or lesions, UTERUS: uterus is normal size, shape, consistency and nontender, ADNEXA: normal adnexa in size, nontender and no masses, BV STD testing Flagyl rx Std testing done Rt 7/14 for AEX Pt has chrons dz and is having constant flares.  She wants a second opinion from [facility] will try to schedule

## 2012-07-27 NOTE — Patient Instructions (Signed)
Bacterial Vaginosis Bacterial vaginosis (BV) is a vaginal infection where the normal balance of bacteria in the vagina is disrupted. The normal balance is then replaced by an overgrowth of certain bacteria. There are several different kinds of bacteria that can cause BV. BV is the most common vaginal infection in women of childbearing age. CAUSES   The cause of BV is not fully understood. BV develops when there is an increase or imbalance of harmful bacteria.  Some activities or behaviors can upset the normal balance of bacteria in the vagina and put women at increased risk including:  Having a new sex partner or multiple sex partners.  Douching.  Using an intrauterine device (IUD) for contraception.  It is not clear what role sexual activity plays in the development of BV. However, women that have never had sexual intercourse are rarely infected with BV. Women do not get BV from toilet seats, bedding, swimming pools or from touching objects around them.  SYMPTOMS   Grey vaginal discharge.  A fish-like odor with discharge, especially after sexual intercourse.  Itching or burning of the vagina and vulva.  Burning or pain with urination.  Some women have no signs or symptoms at all. DIAGNOSIS  Your caregiver must examine the vagina for signs of BV. Your caregiver will perform lab tests and look at the sample of vaginal fluid through a microscope. They will look for bacteria and abnormal cells (clue cells), a pH test higher than 4.5, and a positive amine test all associated with BV.  RISKS AND COMPLICATIONS   Pelvic inflammatory disease (PID).  Infections following gynecology surgery.  Developing HIV.  Developing herpes virus. TREATMENT  Sometimes BV will clear up without treatment. However, all women with symptoms of BV should be treated to avoid complications, especially if gynecology surgery is planned. Female partners generally do not need to be treated. However, BV may spread  between female sex partners so treatment is helpful in preventing a recurrence of BV.   BV may be treated with antibiotics. The antibiotics come in either pill or vaginal cream forms. Either can be used with nonpregnant or pregnant women, but the recommended dosages differ. These antibiotics are not harmful to the baby.  BV can recur after treatment. If this happens, a second round of antibiotics will often be prescribed.  Treatment is important for pregnant women. If not treated, BV can cause a premature delivery, especially for a pregnant woman who had a premature birth in the past. All pregnant women who have symptoms of BV should be checked and treated.  For chronic reoccurrence of BV, treatment with a type of prescribed gel vaginally twice a week is helpful. HOME CARE INSTRUCTIONS   Finish all medication as directed by your caregiver.  Do not have sex until treatment is completed.  Tell your sexual partner that you have a vaginal infection. They should see their caregiver and be treated if they have problems, such as a mild rash or itching.  Practice safe sex. Use condoms. Only have 1 sex partner. PREVENTION  Basic prevention steps can help reduce the risk of upsetting the natural balance of bacteria in the vagina and developing BV:  Do not have sexual intercourse (be abstinent).  Do not douche.  Use all of the medicine prescribed for treatment of BV, even if the signs and symptoms go away.  Tell your sex partner if you have BV. That way, they can be treated, if needed, to prevent reoccurrence. SEEK MEDICAL CARE IF:  Your symptoms are not improving after 3 days of treatment.  You have increased discharge, pain, or fever. MAKE SURE YOU:   Understand these instructions.  Will watch your condition.  Will get help right away if you are not doing well or get worse. FOR MORE INFORMATION  Division of STD Prevention (DSTDP), Centers for Disease Control and Prevention:  [employer] (ASHA): www.ashastd.org  Document Released: 06/30/2005 Document Revised: 09/22/2011 Document Reviewed: 12/21/2008 [facility] Patient Information 2013 [place].

## 2012-07-27 NOTE — Telephone Encounter (Signed)
rx sent

## 2012-07-28 ENCOUNTER — Telehealth: Payer: Self-pay | Admitting: Internal Medicine

## 2012-07-28 NOTE — Telephone Encounter (Signed)
Patient calling to let Dr. [name] know that her OBGYN put her on Flagyl 500 mg BID x 7 days for bacterial vaginosis. She did not take her Cimzia last week due to fever. She is to call us next week before she restarts it.

## 2012-07-28 NOTE — Addendum Note (Signed)
Addended by: Darl Householder on: 07/28/2012 08:44 AM   Modules accepted: Orders

## 2012-07-28 NOTE — Telephone Encounter (Signed)
OK to use Flagyl. Hold Cimzia for another week.

## 2012-07-29 ENCOUNTER — Telehealth: Payer: Self-pay

## 2012-07-29 LAB — GC/CHLAMYDIA PROBE AMP
CT Probe RNA: NEGATIVE
GC Probe RNA: NEGATIVE

## 2012-07-29 LAB — HIV ANTIBODY (ROUTINE TESTING W REFLEX): HIV-1/HIV-2 Ab: NONREACTIVE

## 2012-07-29 NOTE — Telephone Encounter (Signed)
Message copied by Ilean China on Thu Jul 29, 2012 10:04 AM ------      Message from: Crawford Givens      Created: Tue Jul 27, 2012  4:34 PM       Please refer pt to GI that specializes in Chrons disease at [facility].

## 2012-07-29 NOTE — Telephone Encounter (Signed)
I left a message for the pt to call me back to discuss referral for second opinion

## 2012-07-29 NOTE — Telephone Encounter (Signed)
Referral to [facility] for second opinion and eval of Chrohns disease records faxed they will call pt with appt date and time

## 2012-07-29 NOTE — Telephone Encounter (Signed)
Unable to reach patient because her mailbox is full. Will try again later.

## 2012-07-29 NOTE — Telephone Encounter (Signed)
Spoke with patient and gave her Dr. Nichola Sizer recommendations. She would like to talk with Dr. [name] about a possible second opinion for her Crohn's. She can be reached at [phone].

## 2012-08-28 ENCOUNTER — Other Ambulatory Visit: Payer: Self-pay

## 2012-08-30 ENCOUNTER — Telehealth: Payer: Self-pay | Admitting: Obstetrics and Gynecology

## 2012-08-30 ENCOUNTER — Telehealth: Payer: Self-pay | Admitting: Internal Medicine

## 2012-08-30 NOTE — Telephone Encounter (Signed)
We did not refer patient, so not sure exactly where she is to go. I assume she is going to [facility]. I gave her the phone number that I have and told her to contact them to make sure her appointment is tomorrow and to discuss location of appt.

## 2012-08-30 NOTE — Telephone Encounter (Signed)
TC to pt.States question has been answered.

## 2012-09-24 ENCOUNTER — Other Ambulatory Visit: Payer: Self-pay | Admitting: Internal Medicine

## 2012-12-23 ENCOUNTER — Telehealth: Payer: Self-pay | Admitting: Internal Medicine

## 2012-12-23 NOTE — Telephone Encounter (Signed)
I have reviewed the [place] note from [place] they did not change anything, they think N&V is functional- started Remeron. Taper Entecort. OK to stop 6MP. Flagyl/Cipro for rectal  Disease. I will see her.

## 2012-12-23 NOTE — Telephone Encounter (Signed)
According to Up to date, 6MP and Cimzia are safe to use  In conception, during pregnancy and during breast feeding. If discontinued,the resulting flare up of Crohn's disease is associated with higher rate of complications and lower birth rate. I have no treceived any communication from [facility]- did she go there? I need their consultation note.

## 2012-12-23 NOTE — Telephone Encounter (Signed)
Patient did go to [facility]. Notes are under Media tab.

## 2012-12-23 NOTE — Telephone Encounter (Signed)
Spoke with patient and she found out yesterday she is pregnant. No sure how far along she is. She will see her OB GYN on 01/06/13. She is on 6MP and Cimzia. Scheduled OV with Dr. [name] on 01/17/13 at 3:15 PM.

## 2012-12-28 ENCOUNTER — Encounter (HOSPITAL_COMMUNITY): Payer: Self-pay | Admitting: Emergency Medicine

## 2012-12-28 DIAGNOSIS — Z79899 Other long term (current) drug therapy: Secondary | ICD-10-CM | POA: Insufficient documentation

## 2012-12-28 DIAGNOSIS — Z8719 Personal history of other diseases of the digestive system: Secondary | ICD-10-CM | POA: Insufficient documentation

## 2012-12-28 DIAGNOSIS — Z8614 Personal history of Methicillin resistant Staphylococcus aureus infection: Secondary | ICD-10-CM | POA: Insufficient documentation

## 2012-12-28 DIAGNOSIS — Z8659 Personal history of other mental and behavioral disorders: Secondary | ICD-10-CM | POA: Insufficient documentation

## 2012-12-28 DIAGNOSIS — I10 Essential (primary) hypertension: Secondary | ICD-10-CM | POA: Insufficient documentation

## 2012-12-28 DIAGNOSIS — Z87448 Personal history of other diseases of urinary system: Secondary | ICD-10-CM | POA: Insufficient documentation

## 2012-12-28 DIAGNOSIS — Z8742 Personal history of other diseases of the female genital tract: Secondary | ICD-10-CM | POA: Insufficient documentation

## 2012-12-28 DIAGNOSIS — Z8619 Personal history of other infectious and parasitic diseases: Secondary | ICD-10-CM | POA: Insufficient documentation

## 2012-12-28 DIAGNOSIS — O99891 Other specified diseases and conditions complicating pregnancy: Secondary | ICD-10-CM | POA: Insufficient documentation

## 2012-12-28 DIAGNOSIS — Z862 Personal history of diseases of the blood and blood-forming organs and certain disorders involving the immune mechanism: Secondary | ICD-10-CM | POA: Insufficient documentation

## 2012-12-28 DIAGNOSIS — E860 Dehydration: Secondary | ICD-10-CM | POA: Insufficient documentation

## 2012-12-28 DIAGNOSIS — Z3201 Encounter for pregnancy test, result positive: Secondary | ICD-10-CM | POA: Insufficient documentation

## 2012-12-28 DIAGNOSIS — Z86718 Personal history of other venous thrombosis and embolism: Secondary | ICD-10-CM | POA: Insufficient documentation

## 2012-12-28 DIAGNOSIS — Z8679 Personal history of other diseases of the circulatory system: Secondary | ICD-10-CM | POA: Insufficient documentation

## 2012-12-28 LAB — POCT PREGNANCY, URINE: Preg Test, Ur: POSITIVE — AB

## 2012-12-28 NOTE — ED Notes (Signed)
PT. REPORTS DIZZINESS / LIGHTHEADED WITH URINARY FREQUENCY FOR SEVERAL DAYS , PT. STATED POSITIVE HOME PREGNANCY TEST  , LMP 11/16/2012 , SCHEDULED FOR PRENATAL APPOINTMENT NEXT WEEK.

## 2012-12-29 ENCOUNTER — Emergency Department (HOSPITAL_COMMUNITY)
Admission: EM | Admit: 2012-12-29 | Discharge: 2012-12-29 | Disposition: A | Discharge status: Home / Self Care | Payer: Medicaid Other | Attending: Emergency Medicine | Admitting: Emergency Medicine

## 2012-12-29 DIAGNOSIS — E860 Dehydration: Secondary | ICD-10-CM

## 2012-12-29 DIAGNOSIS — Z3490 Encounter for supervision of normal pregnancy, unspecified, unspecified trimester: Secondary | ICD-10-CM

## 2012-12-29 LAB — URINALYSIS, ROUTINE W REFLEX MICROSCOPIC
Glucose, UA: NEGATIVE mg/dL
Specific Gravity, Urine: 1.023 (ref 1.005–1.030)
pH: 6.5 (ref 5.0–8.0)

## 2012-12-29 LAB — URINE MICROSCOPIC-ADD ON

## 2012-12-29 NOTE — ED Notes (Signed)
Pt denies any questions upon discharge.

## 2012-12-29 NOTE — ED Provider Notes (Signed)
History     CSN: [id number]  Arrival date & time 12/28/12  2317   First MD Initiated Contact with Patient 12/29/12 0043      Chief Complaint  Patient presents with  . Dizziness  . Urinary Frequency    (Consider location/radiation/quality/duration/timing/severity/associated sxs/prior treatment) HPI 22 year old female presents emergency part complaining of dizziness, dehydration, and burning with urination.  Patient is [redacted] weeks pregnant.  She reports history of Crohn's disease.  She has had no recent Crohn's flares.  She is controlled on her current medications.  She has been taking Phenergan for nausea.  She admits to not eating and drinking enough over the last few days.  Due to nausea.  She's had no vomiting.  No fever.  No back pain.  Patient is concerned that she might have a urinary tract infection.  She is scheduled to see her OB for her first prenatal appointment.  Next week.  No prior pregnancies. Past Medical History  Diagnosis Date  . 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 Anxiety   . Candida esophagitis   . Ovarian cyst, left   . Anemia     iron def  . DVT (deep venous thrombosis) 08/2010    Left IJ (postop) anticoag x 3 mo  . SVT (supraventricular tachycardia)   . Depression   . Small bowel obstruction   . Acute renal failure   . MRSA (methicillin resistant Staphylococcus aureus)     multiple abscesses  . Allergy   . Blood transfusion   . Hypertension   . Perianal fistula   . 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 Gastroparesis     Past Surgical History  Procedure Laterality Date  . Cholecystectomy  2009  . Closure of ileostomy/loop  08/2010  . Colectomy with diverting loop ileostomy    . Appendectomy    . Esophagogastroduodenoscopy  11/11/2011    Procedure: ESOPHAGOGASTRODUODENOSCOPY (EGD);  Surgeon: Lafayette Dragon, MD;  Location: [facility];  Service: Endoscopy;  Laterality: N/A;  .  Examination under anesthesia  11/12/2011    Procedure: EXAM UNDER ANESTHESIA;  Surgeon: Rolm Bookbinder, MD;  Location: [facility];  Service: General;  Laterality: N/A;  . Proctoscopy  11/12/2011    Procedure: PROCTOSCOPY;  Surgeon: Rolm Bookbinder, MD;  Location: [facility];  Service: General;  Laterality: N/A;  . Incision and drainage perirectal abscess  11/12/2011    Procedure: IRRIGATION AND DEBRIDEMENT PERIRECTAL ABSCESS;  Surgeon: Rolm Bookbinder, MD;  Location: [facility];  Service: General;  Laterality: N/A;  . Incision and drainage perirectal abscess  12/11/2011    Procedure: IRRIGATION AND DEBRIDEMENT PERIRECTAL ABSCESS;  Surgeon: [name], MD;  Location: [facility];  Service: General;  Laterality: N/A;    Family History  Problem Relation Age of Onset  . Diabetes Maternal Uncle   . Heart attack Maternal Grandmother   . Heart disease Maternal Grandmother   . Emphysema Maternal Grandmother   . Colon cancer Neg Hx   . Diabetes Cousin   . Sickle cell trait Father     History  Substance Use Topics  . Smoking status: Never Smoker   . Smokeless tobacco: Never Used  . Alcohol Use: No    OB History   Grav Para Term Preterm Abortions TAB SAB Ect Mult Living   0               Review of Systems  All other systems reviewed and are negative.    Allergies  Iron dextran and Iron  Home Medications   Current Outpatient Rx  Name  Route  Sig  Dispense  Refill  . mercaptopurine (PURINETHOL) 50 MG tablet   Oral   Take 100 mg by mouth daily.         . Prenatal Vit-Fe Fumarate-FA (PRENATAL MULTIVITAMIN) TABS   Oral   Take 1 tablet by mouth daily at 12 noon.         . Certolizumab Pegol (CIMZIA PREFILLED) 2 X 200 MG/ML KIT   Subcutaneous   Inject 200 mg into the skin every 14 (fourteen) days.   2 kit   3     BP 128/78  Pulse 82  Temp(Src) 98.2 F (36.8 C) (Oral)  Resp 16  SpO2 100%  LMP 11/16/2012  Physical Exam  Nursing note and vitals reviewed. Constitutional: She  is oriented to person, place, and time. She appears well-developed and well-nourished. No distress.  HENT:  Head: Normocephalic and atraumatic.  Nose: Nose normal.  Mouth/Throat: Oropharynx is clear and moist.  Eyes: Conjunctivae and EOM are normal. Pupils are equal, round, and reactive to light.  Neck: Normal range of motion. Neck supple. No JVD present. No tracheal deviation present. No thyromegaly present.  Cardiovascular: Normal rate, regular rhythm, normal heart sounds and intact distal pulses.  Exam reveals no gallop and no friction rub.   No murmur heard. Pulmonary/Chest: Effort normal and breath sounds normal. No stridor. No respiratory distress. She has no wheezes. She has no rales. She exhibits no tenderness.  Abdominal: Soft. Bowel sounds are normal. She exhibits no distension and no mass. There is no tenderness. There is no rebound and no guarding.  Musculoskeletal: Normal range of motion. She exhibits no edema and no tenderness.  Lymphadenopathy:    She has no cervical adenopathy.  Neurological: She is alert and oriented to person, place, and time. She exhibits normal muscle tone. Coordination normal.  Skin: Skin is warm and dry. No rash noted. No erythema. No pallor.  Psychiatric: She has a normal mood and affect. Her behavior is normal. Judgment and thought content normal.    ED Course  Procedures (including critical care time)  Labs Reviewed  URINALYSIS, ROUTINE W REFLEX MICROSCOPIC - Abnormal; Notable for the following:    APPearance CLOUDY (*)    Hgb urine dipstick TRACE (*)    Ketones, ur 15 (*)    Leukocytes, UA SMALL (*)    All other components within normal limits  URINE MICROSCOPIC-ADD ON - Abnormal; Notable for the following:    Squamous Epithelial / LPF MANY (*)    All other components within normal limits  POCT PREGNANCY, URINE - Abnormal; Notable for the following:    Preg Test, Ur POSITIVE (*)    All other components within normal limits   No results  found.   1. Mild dehydration   2. Pregnancy at early stage       MDM  Signs of urinary tract infection.  Patient with mild dehydration.  She is tolerating fluids.  Had long discussion with patient on changes in the body with pregnancy, and need for good hydration, small, frequent healthy meals.  Patient feels okay to go home.        Kalman Drape, MD 12/29/12 [phone]

## 2013-01-03 ENCOUNTER — Other Ambulatory Visit: Payer: Self-pay | Admitting: Internal Medicine

## 2013-01-12 ENCOUNTER — Other Ambulatory Visit: Payer: Self-pay

## 2013-01-17 ENCOUNTER — Ambulatory Visit (INDEPENDENT_AMBULATORY_CARE_PROVIDER_SITE_OTHER): Payer: Medicaid Other | Admitting: Internal Medicine

## 2013-01-17 ENCOUNTER — Encounter: Payer: Self-pay | Admitting: Internal Medicine

## 2013-01-17 VITALS — BP 124/80 | HR 93 | Ht 64.0 in | Wt 218.0 lb

## 2013-01-17 DIAGNOSIS — K5090 Crohn's disease, unspecified, without complications: Secondary | ICD-10-CM

## 2013-01-17 DIAGNOSIS — D509 Iron deficiency anemia, unspecified: Secondary | ICD-10-CM

## 2013-01-17 DIAGNOSIS — K603 Anal fistula: Secondary | ICD-10-CM

## 2013-01-17 MED ORDER — AMOXICILLIN 500 MG PO TABS: 500.0000 mg | ORAL_TABLET | Freq: Every day | ORAL | Status: DC

## 2013-01-17 NOTE — Progress Notes (Signed)
[name] [birth year] MRN [id number]   History of Present Illness:  This is a 22 year old African American female with Crohn's disease of the duodenum, terminal ileum, colon and rectum with a current draining rectal abscess. 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 She is status post terminal ileal resection and takedown of a diverting loop ileostomy in February 2012. She is on Cimzia 200 mg every 2 weeks and 6-MP 100 mg daily. She discontinued Entocort. She is in 9 weeks of intrauterine pregnancy. Her obstetrician is Dr. Charlesetta Garibaldi. Her first prenatal visit will be next week. She is currently asymptomatic except for a draining rectal abcess/fistula. She denies diarrhea, nausea or vomiting. Her  past history is  significant for C. difficile colitis in the Fall of 2013. She also has morbid obesity and chronic iron deficiency. She is allergic to iron dextran infusion ( had an anaphylactic reaction).. She has a history of DVT. She was in the past on Humira but because of insurance problems, it had to be discontinued. She was intermittently on Cipro and Flagyl for rectal abscess. Her last upper endoscopy in April 2013 was normal and there was no evidence of duodenal Crohn's disease. Her last colonoscopy in March 2012 showed no active disease. She was seen at [facility] for a second opinion in the Spring of 2014, sent by Dr Charlesetta Garibaldi.. The recommendations were to continue Cimzia, to taper the Entocort and continue Flagyl and Cipro. There is a question of whether she needs the 6-MP in the presence of Cimzia.   Past Medical History  Diagnosis Date  . 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 Anxiety   . Candida esophagitis   . Ovarian cyst, left   . Anemia     iron def  . DVT (deep venous thrombosis) 08/2010    Left IJ (postop) anticoag x 3 mo  . SVT (supraventricular  tachycardia)   . Depression   . Small bowel obstruction   . Acute renal failure   . MRSA (methicillin resistant Staphylococcus aureus)     multiple abscesses  . Allergy   . Blood transfusion   . Hypertension   . Perianal fistula   . 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 Gastroparesis    Past Surgical History  Procedure Laterality Date  . Cholecystectomy  2009  . Closure of ileostomy/loop  08/2010  . Colectomy with diverting loop ileostomy    . Appendectomy    . Esophagogastroduodenoscopy  11/11/2011    Procedure: ESOPHAGOGASTRODUODENOSCOPY (EGD);  Surgeon: Lafayette Dragon, MD;  Location: [facility];  Service: Endoscopy;  Laterality: N/A;  . Examination under anesthesia  11/12/2011    Procedure: EXAM UNDER ANESTHESIA;  Surgeon: Rolm Bookbinder, MD;  Location: [facility];  Service: General;  Laterality: N/A;  . Proctoscopy  11/12/2011    Procedure: PROCTOSCOPY;  Surgeon: Rolm Bookbinder, MD;  Location: [facility];  Service: General;  Laterality: N/A;  . Incision and drainage perirectal abscess  11/12/2011    Procedure: IRRIGATION AND DEBRIDEMENT PERIRECTAL ABSCESS;  Surgeon: Rolm Bookbinder, MD;  Location: [facility];  Service: General;  Laterality: N/A;  . Incision and drainage perirectal abscess  12/11/2011    Procedure: IRRIGATION AND DEBRIDEMENT PERIRECTAL ABSCESS;  Surgeon: [name], MD;  Location: [facility];  Service: General;  Laterality: N/A;    reports  that she has never smoked. She has never used smokeless tobacco. She reports that she does not drink alcohol or use illicit drugs. family history includes Diabetes in her cousin and maternal uncle; Emphysema in her maternal grandmother; Heart attack in her maternal grandmother; Heart disease in her maternal grandmother; and Sickle cell trait in her father.  There is no history of Colon cancer. Allergies  Allergen Reactions  . Iron Dextran Anaphylaxis  . Iron     REACTION: IV iron dextran        Review of  Systems: Denies nausea vomiting abdominal pain diarrhea  The remainder of the 10 point ROS is negative except as outlined in H&P   Physical Exam: General appearance  Well developed, in no distress. Obese Eyes- non icteric. HEENT nontraumatic, normocephalic. Mouth no lesions, tongue papillated, no cheilosis. Neck supple without adenopathy, thyroid not enlarged, no carotid bruits, no JVD. Lungs Clear to auscultation bilaterally. Cor normal S1, normal S2, regular rhythm, no murmur,  quiet precordium. Abdomen: Protuberant abdomen nontender with normal active bowel sounds. Well-healed surgical scars. Rectal: Normal fistulous opening in the vicinity ofl anal opening on the side of the rectovaginal wall. There was some purulent material and small amount of blood. There was no induration or fluctuation.  exam is otherwise unremarkable and stool which is Hemoccult negative. Extremities no pedal edema. Skin no lesions. Neurological alert and oriented x 3. Psychological normal mood and affect.  Assessment and Plan:  Problem #23 23 year old African American female 9 weeks intrauterine pregnancy. Her first prenatal visit will be next week. From a GI standpoint, I recommend she be treated as a High-Risk pregnancy . Her immunosuppressive medications have been reviewed and are safe to be used during pregnancy; specifically 6-MP is class D but may be used during pregnancy and so is Cimzia. She needs to avoid Imodium, Lomotil and Cipro.If these medications are stopped, the risk of flare up  Would be high and would  Have negative impact on outcome of the pregnancy.  Problem #2 Crohn disease of the duodenum, terminal ileum and colon with no evidence of active colon disease as of her last colonoscopy. She will continue Cimzia and 6-MP.  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 We will start amoxicillin 500 mg daily for 10 days and watch for possibility of pseudomembranous colitis.  Prioblem #4  Mild anemia with a hemoglobin of 11.6. 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 Patient will start prenatal vitamins with iron and her hemoglobin will have to be carefully monitored during pregnancy. 5. Duodenal Crohn's disease , this was diagnosed first at age 83, before she was diagnosed with colitis and ileitis. She was told at [facility] to switch from Nexiem to Pepcid but I am concerned  the dose may not be sufficient to conrtol the duodenitis. I don't thing the old records were available to the GI doctors at [facility], so, we will see how she does , may have to go back on PPI's.  01/17/2013 [name]

## 2013-01-17 NOTE — Patient Instructions (Addendum)
We have sent the following medications to your pharmacy for you to pick up at your convenience: Amoxicillin  Please follow up with Dr [name] in 8 weeks.    CC: Dr Asa Lente, Dr Delane Ginger.Dillard   ,

## 2013-01-23 ENCOUNTER — Emergency Department (HOSPITAL_COMMUNITY)
Admission: EM | Admit: 2013-01-23 | Discharge: 2013-01-23 | Disposition: A | Discharge status: Home / Self Care | Payer: Medicaid Other | Attending: Emergency Medicine | Admitting: Emergency Medicine

## 2013-01-23 ENCOUNTER — Emergency Department (HOSPITAL_COMMUNITY): Payer: Medicaid Other

## 2013-01-23 ENCOUNTER — Encounter (HOSPITAL_COMMUNITY): Payer: Self-pay

## 2013-01-23 DIAGNOSIS — Z9889 Other specified postprocedural states: Secondary | ICD-10-CM | POA: Insufficient documentation

## 2013-01-23 DIAGNOSIS — O9989 Other specified diseases and conditions complicating pregnancy, childbirth and the puerperium: Secondary | ICD-10-CM | POA: Insufficient documentation

## 2013-01-23 DIAGNOSIS — N390 Urinary tract infection, site not specified: Secondary | ICD-10-CM | POA: Insufficient documentation

## 2013-01-23 DIAGNOSIS — R109 Unspecified abdominal pain: Secondary | ICD-10-CM | POA: Insufficient documentation

## 2013-01-23 DIAGNOSIS — Z862 Personal history of diseases of the blood and blood-forming organs and certain disorders involving the immune mechanism: Secondary | ICD-10-CM | POA: Insufficient documentation

## 2013-01-23 DIAGNOSIS — Z8679 Personal history of other diseases of the circulatory system: Secondary | ICD-10-CM | POA: Insufficient documentation

## 2013-01-23 DIAGNOSIS — Z8659 Personal history of other mental and behavioral disorders: Secondary | ICD-10-CM | POA: Insufficient documentation

## 2013-01-23 DIAGNOSIS — K5090 Crohn's disease, unspecified, without complications: Secondary | ICD-10-CM | POA: Insufficient documentation

## 2013-01-23 DIAGNOSIS — Z8742 Personal history of other diseases of the female genital tract: Secondary | ICD-10-CM | POA: Insufficient documentation

## 2013-01-23 DIAGNOSIS — O26899 Other specified pregnancy related conditions, unspecified trimester: Secondary | ICD-10-CM

## 2013-01-23 DIAGNOSIS — R112 Nausea with vomiting, unspecified: Secondary | ICD-10-CM | POA: Insufficient documentation

## 2013-01-23 DIAGNOSIS — Z8719 Personal history of other diseases of the digestive system: Secondary | ICD-10-CM | POA: Insufficient documentation

## 2013-01-23 DIAGNOSIS — Z8619 Personal history of other infectious and parasitic diseases: Secondary | ICD-10-CM | POA: Insufficient documentation

## 2013-01-23 DIAGNOSIS — O2390 Unspecified genitourinary tract infection in pregnancy, unspecified trimester: Secondary | ICD-10-CM | POA: Insufficient documentation

## 2013-01-23 DIAGNOSIS — O218 Other vomiting complicating pregnancy: Secondary | ICD-10-CM | POA: Insufficient documentation

## 2013-01-23 DIAGNOSIS — N898 Other specified noninflammatory disorders of vagina: Secondary | ICD-10-CM | POA: Insufficient documentation

## 2013-01-23 DIAGNOSIS — R748 Abnormal levels of other serum enzymes: Secondary | ICD-10-CM | POA: Insufficient documentation

## 2013-01-23 DIAGNOSIS — Z8614 Personal history of Methicillin resistant Staphylococcus aureus infection: Secondary | ICD-10-CM | POA: Insufficient documentation

## 2013-01-23 DIAGNOSIS — Z87448 Personal history of other diseases of urinary system: Secondary | ICD-10-CM | POA: Insufficient documentation

## 2013-01-23 DIAGNOSIS — O10019 Pre-existing essential hypertension complicating pregnancy, unspecified trimester: Secondary | ICD-10-CM | POA: Insufficient documentation

## 2013-01-23 LAB — CBC WITH DIFFERENTIAL/PLATELET
Basophils Relative: 0 % (ref 0–1)
Eosinophils Absolute: 0.1 10*3/uL (ref 0.0–0.7)
Eosinophils Relative: 1 % (ref 0–5)
Lymphs Abs: 1.9 10*3/uL (ref 0.7–4.0)
MCH: 26.7 pg (ref 26.0–34.0)
MCHC: 31.7 g/dL (ref 30.0–36.0)
MCV: 84.2 fL (ref 78.0–100.0)
Platelets: 393 10*3/uL (ref 150–400)
RBC: 4.42 MIL/uL (ref 3.87–5.11)
RDW: 15.8 % — ABNORMAL HIGH (ref 11.5–15.5)

## 2013-01-23 LAB — URINALYSIS, ROUTINE W REFLEX MICROSCOPIC
Nitrite: NEGATIVE
Specific Gravity, Urine: 1.031 — ABNORMAL HIGH (ref 1.005–1.030)
pH: 6.5 (ref 5.0–8.0)

## 2013-01-23 LAB — COMPREHENSIVE METABOLIC PANEL
ALT: 110 U/L — ABNORMAL HIGH (ref 0–35)
Albumin: 3.5 g/dL (ref 3.5–5.2)
Calcium: 9.8 mg/dL (ref 8.4–10.5)
GFR calc Af Amer: >90 mL/min (ref >90)
Glucose, Bld: 82 mg/dL (ref 70–99)
Sodium: 132 mEq/L — ABNORMAL LOW (ref 135–145)
Total Protein: 8.3 g/dL (ref 6.0–8.3)

## 2013-01-23 LAB — WET PREP, GENITAL: Yeast Wet Prep HPF POC: NONE SEEN

## 2013-01-23 LAB — URINE MICROSCOPIC-ADD ON

## 2013-01-23 LAB — POCT PREGNANCY, URINE: Preg Test, Ur: POSITIVE — AB

## 2013-01-23 MED ORDER — ACETAMINOPHEN 325 MG PO TABS
650.0000 mg | ORAL_TABLET | Freq: Once | ORAL | Status: AC
  Administered 2013-01-23: 650 mg via ORAL
  Filled 2013-01-23: qty 2

## 2013-01-23 MED ORDER — ONDANSETRON HCL 4 MG/2ML IJ SOLN: 4.0000 mg | Freq: Once | INTRAMUSCULAR | Status: DC

## 2013-01-23 MED ORDER — SODIUM CHLORIDE 0.9 % IV BOLUS (SEPSIS)
500.0000 mL | Freq: Once | INTRAVENOUS | Status: AC
  Administered 2013-01-23: 500 mL via INTRAVENOUS

## 2013-01-23 MED ORDER — HYDROMORPHONE HCL PF 1 MG/ML IJ SOLN
1.0000 mg | Freq: Once | INTRAMUSCULAR | Status: DC
  Filled 2013-01-23: qty 1

## 2013-01-23 MED ORDER — METOCLOPRAMIDE HCL 5 MG/ML IJ SOLN
10.0000 mg | Freq: Once | INTRAMUSCULAR | Status: AC
  Administered 2013-01-23: 10 mg via INTRAVENOUS
  Filled 2013-01-23: qty 2

## 2013-01-23 MED ORDER — NITROFURANTOIN MONOHYD MACRO 100 MG PO CAPS: 100.0000 mg | ORAL_CAPSULE | Freq: Two times a day (BID) | ORAL | Status: DC

## 2013-01-23 NOTE — ED Notes (Signed)
Pt c/o lower abdominal pain for 2 days.  Pt has hx of Crohn's and is pregnant.  Pt endorses N/V over last few days, especially after eating.  Pt states she has hx of SBO and believes that is what she has now.  Pt denies fever, but states she has diarrhea in keeping with Crohn's.  Pt denies vaginal discharge.  Pt's lung sounds are clear and she has good pulses in all 4 extremities.  Bowel sounds are present.

## 2013-01-23 NOTE — ED Notes (Signed)
She states she has had abd. Pain, plus n/v few episodes since yesterday.  She states she is just over [redacted] weeks pregnant, and has a hx of Crohn's dis.  She feels this is related to her Crohn's.  She is in no distress.

## 2013-01-23 NOTE — ED Provider Notes (Signed)
History    CSN: [id number] Arrival date & time 01/23/13  1356  First MD Initiated Contact with Patient 01/23/13 1702     Chief Complaint  Patient presents with  . Abdominal Pain   (Consider location/radiation/quality/duration/timing/severity/associated sxs/prior Treatment) HPI Comments: 22 y/o female who is [redacted] weeks pregnant with a PMHx of Crohn's disease, small bowel obstruction, hypertension, anxiety and gastroparesis presents to the ED complaining of gradual onset mid-abdominal pain beginning last night. Pain described as a "grumbling" pain radiating across her abdomen, 10/10 last night, decreased to 7/10 today. She has not tried any alleviating factors. Admits to nausea and vomiting, had vaginal discharge this morning. Denies fever, chills, constipation, diarrhea, vaginal bleeding, urinary changes. States she has not had any problems with her pregnancy. Pain similar to both a Crohn's flare and her prior bowel obstruction.  Patient is a 22 y.o. female presenting with abdominal pain. The history is provided by the patient.  Abdominal Pain Associated symptoms include abdominal pain, nausea and vomiting. Pertinent negatives include no chills or fever.   Past Medical History  Diagnosis Date  . 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 Anxiety   . Candida esophagitis   . Ovarian cyst, left   . Anemia     iron def  . DVT (deep venous thrombosis) 08/2010    Left IJ (postop) anticoag x 3 mo  . SVT (supraventricular tachycardia)   . Depression   . Small bowel obstruction   . Acute renal failure   . MRSA (methicillin resistant Staphylococcus aureus)     multiple abscesses  . Allergy   . Blood transfusion   . Hypertension   . Perianal fistula   . 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 Gastroparesis    Past Surgical History  Procedure Laterality Date  . Cholecystectomy  2009  . Closure of ileostomy/loop  08/2010  .  Colectomy with diverting loop ileostomy    . Appendectomy    . Esophagogastroduodenoscopy  11/11/2011    Procedure: ESOPHAGOGASTRODUODENOSCOPY (EGD);  Surgeon: Lafayette Dragon, MD;  Location: [facility];  Service: Endoscopy;  Laterality: N/A;  . Examination under anesthesia  11/12/2011    Procedure: EXAM UNDER ANESTHESIA;  Surgeon: Rolm Bookbinder, MD;  Location: [facility];  Service: General;  Laterality: N/A;  . Proctoscopy  11/12/2011    Procedure: PROCTOSCOPY;  Surgeon: Rolm Bookbinder, MD;  Location: [facility];  Service: General;  Laterality: N/A;  . Incision and drainage perirectal abscess  11/12/2011    Procedure: IRRIGATION AND DEBRIDEMENT PERIRECTAL ABSCESS;  Surgeon: Rolm Bookbinder, MD;  Location: [facility];  Service: General;  Laterality: N/A;  . Incision and drainage perirectal abscess  12/11/2011    Procedure: IRRIGATION AND DEBRIDEMENT PERIRECTAL ABSCESS;  Surgeon: [name], MD;  Location: [facility];  Service: General;  Laterality: N/A;   Family History  Problem Relation Age of Onset  . Diabetes Maternal Uncle   . Heart attack Maternal Grandmother   . Heart disease Maternal Grandmother   . Emphysema Maternal Grandmother   . Colon cancer Neg Hx   . Diabetes Cousin   . Sickle cell trait Father    History  Substance Use Topics  . Smoking status: Never Smoker   . Smokeless tobacco: Never Used  . Alcohol Use: No   OB History   Grav Para Term Preterm Abortions TAB SAB Ect Mult Living   1  Review of Systems  Constitutional: Negative for fever and chills.  Gastrointestinal: Positive for nausea, vomiting and abdominal pain.  Genitourinary: Positive for vaginal discharge. Negative for vaginal bleeding and vaginal pain.  All other systems reviewed and are negative.    Allergies  Iron dextran and Iron  Home Medications   Current Outpatient Rx  Name  Route  Sig  Dispense  Refill  . acetaminophen (TYLENOL) 500 MG tablet   Oral   Take 1,000 mg by mouth every 6  (six) hours as needed for pain.         Marland Kitchen amoxicillin (AMOXIL) 500 MG tablet   Oral   Take 1 tablet (500 mg total) by mouth daily.   10 tablet   0   . calcium carbonate (TUMS - DOSED IN MG ELEMENTAL CALCIUM) 500 MG chewable tablet   Oral   Chew 1 tablet by mouth as needed for heartburn.         . Certolizumab Pegol (CIMZIA PREFILLED) 2 X 200 MG/ML KIT   Subcutaneous   Inject 200 mg into the skin every 14 (fourteen) days.   2 kit   3   . Doxylamine-Pyridoxine (DICLEGIS) 10-10 MG TBEC   Oral   Take by mouth.         . mercaptopurine (PURINETHOL) 50 MG tablet   Oral   Take 100 mg by mouth daily.         . promethazine (PHENERGAN) 25 MG tablet   Oral   Take 25 mg by mouth every 6 (six) hours as needed for nausea.          Pulse 91  Temp(Src) 99 F (37.2 C) (Oral)  Resp 16  SpO2 100%  LMP 11/16/2012 Physical Exam  Nursing note and vitals reviewed. Constitutional: She is oriented to person, place, and time. She appears well-developed and well-nourished. No distress.  HENT:  Head: Normocephalic and atraumatic.  Mouth/Throat: Oropharynx is clear and moist.  Eyes: Conjunctivae are normal.  Neck: Normal range of motion. Neck supple.  Cardiovascular: Normal rate, regular rhythm and normal heart sounds.   Pulmonary/Chest: Effort normal and breath sounds normal.  Abdominal: Soft. Normal appearance and bowel sounds are normal. She exhibits no distension. There is tenderness. There is no rigidity, no rebound, no guarding, no tenderness at McBurney's point and negative Murphy's sign.  No peritoneal signs. Abdominal TTP of mid-spigastric radiating into RUQ.  Genitourinary: Uterus normal. Cervix exhibits no motion tenderness, no discharge and no friability. Right adnexum displays no mass, no tenderness and no fullness. Left adnexum displays no mass, no tenderness and no fullness. No erythema, tenderness or bleeding around the vagina. Vaginal discharge (thick, white) found.   Musculoskeletal: Normal range of motion. She exhibits no edema.  Neurological: She is alert and oriented to person, place, and time.  Skin: Skin is warm and dry. She is not diaphoretic.  Psychiatric: She has a normal mood and affect. Her behavior is normal.    ED Course  Procedures (including critical care time) Labs Reviewed  WET PREP, GENITAL - Abnormal; Notable for the following:    WBC, Wet Prep HPF POC MODERATE (*)    All other components within normal limits  CBC WITH DIFFERENTIAL - Abnormal; Notable for the following:    Hemoglobin 11.8 (*)    RDW 15.8 (*)    All other components within normal limits  COMPREHENSIVE METABOLIC PANEL - Abnormal; Notable for the following:    Sodium 132 (*)    Potassium  3.3 (*)    AST 58 (*)    ALT 110 (*)    All other components within normal limits  URINALYSIS, ROUTINE W REFLEX MICROSCOPIC - Abnormal; Notable for the following:    Color, Urine AMBER (*)    APPearance CLOUDY (*)    Specific Gravity, Urine 1.031 (*)    Hgb urine dipstick SMALL (*)    Bilirubin Urine SMALL (*)    Ketones, ur >80 (*)    Protein, ur 30 (*)    Leukocytes, UA LARGE (*)    All other components within normal limits  URINE MICROSCOPIC-ADD ON - Abnormal; Notable for the following:    Squamous Epithelial / LPF MANY (*)    Bacteria, UA FEW (*)    All other components within normal limits  POCT PREGNANCY, URINE - Abnormal; Notable for the following:    Preg Test, Ur POSITIVE (*)    All other components within normal limits  GC/CHLAMYDIA PROBE AMP  URINE CULTURE  LIPASE, BLOOD   US Abdomen Complete  01/23/2013   *RADIOLOGY REPORT*  Clinical Data:  Right upper quadrant pain.  History of cholecystectomy.  COMPLETE ABDOMINAL ULTRASOUND  Comparison:  CT abdomen and pelvis 03/02/2012.  Findings:  Gallbladder:  Removed.  Common bile duct:  Measures 0.7 cm.  Liver:  No focal lesion identified.  Within normal limits in parenchymal echogenicity.  IVC:  Appears normal.   Pancreas:  No focal abnormality seen.  Spleen:  Normal in size and appearance.  Right Kidney:  Normal in size and appearance.  Left Kidney:  Normal in size and appearance.  Abdominal aorta:  No aneurysm identified.  IMPRESSION: No acute finding.  Status post cholecystectomy.   Original Report Authenticated By: Orlean Patten, M.D.   1. Abdominal pain in pregnancy   2. Urinary tract infection   3. Elevated liver enzymes     MDM  Patient with abdominal pain, n/v. [redacted] weeks pregnant, hx of crohn's. UTI present. LFTs elevated, no hx of prior elevation. TTP of mid-epigastric into RUQ region of abdomen. Will obtain abdominal US. 7:28 PM Abdominal US unremarkable. Patient is in NAD, abdomen soft, ND, tender to palpation but with clinical improvement noted. No n/v. Vitals stable, she is stable for discharge. Rx macrobid for UTI. F/u with PCP regarding LFTs. Patient discussed with Dr. Vanita Panda who agrees with plan of care.  Illene Labrador, PA-C 01/23/13 1929

## 2013-01-24 ENCOUNTER — Other Ambulatory Visit (HOSPITAL_COMMUNITY): Payer: Medicaid Other

## 2013-01-24 LAB — OB RESULTS CONSOLE ANTIBODY SCREEN: ANTIBODY SCREEN: NEGATIVE

## 2013-01-24 LAB — URINE CULTURE

## 2013-01-24 LAB — OB RESULTS CONSOLE ABO/RH: RH Type: POSITIVE

## 2013-01-24 LAB — OB RESULTS CONSOLE RUBELLA ANTIBODY, IGM: Rubella: IMMUNE

## 2013-01-24 LAB — OB RESULTS CONSOLE RPR: RPR: NONREACTIVE

## 2013-01-24 LAB — OB RESULTS CONSOLE HIV ANTIBODY (ROUTINE TESTING): HIV: NONREACTIVE

## 2013-01-24 NOTE — ED Provider Notes (Signed)
  Medical screening examination/treatment/procedure(s) were performed by non-physician practitioner and as supervising physician I was immediately available for consultation/collaboration.    Carmin Muskrat, MD 01/24/13 [id number]

## 2013-01-25 ENCOUNTER — Encounter: Payer: Self-pay | Admitting: Family Medicine

## 2013-01-25 ENCOUNTER — Ambulatory Visit (HOSPITAL_COMMUNITY)
Admission: RE | Admit: 2013-01-25 | Discharge: 2013-01-25 | Disposition: A | Discharge status: Home / Self Care | Payer: Medicaid Other | Source: Ambulatory Visit | Attending: Obstetrics and Gynecology | Admitting: Obstetrics and Gynecology

## 2013-01-25 ENCOUNTER — Encounter (HOSPITAL_COMMUNITY): Payer: Self-pay | Admitting: Obstetrics and Gynecology

## 2013-01-25 ENCOUNTER — Encounter (HOSPITAL_COMMUNITY): Payer: Self-pay

## 2013-01-25 VITALS — BP 132/80 | HR 102 | Wt 216.0 lb

## 2013-01-25 DIAGNOSIS — K5090 Crohn's disease, unspecified, without complications: Secondary | ICD-10-CM

## 2013-01-25 NOTE — Consult Note (Signed)
Maternal Fetal Medicine Consultation  Requesting Provider(s): Sophronia Simas, MD  Reason for consultation: Crohn's disease, hx of IJ blood clot post operatively  HPI: [name] is a 22 yo G1P0 currently at 10 0/7 weeks, EDD 08/23/2013 who was seen for consultation due history of Crohn's disease.  She reports that she was diagnosed with Crohn's at age 10 and has been followed by Dr. [name] since the time of her diagnosis.  Her Crohn's disease involves the duodenum, terminal ileum, colon and rectum - she currently has a draining rectal abscess.  She underwent a terminal ileal resection and takedown of a diverting loop ileostomy in Feb 2012 and has undergone several surgical procedures over the last year to address the perirectal abscess. She has been on PPIs since the time of her diagnosis, but recently stopped due to pregnancy.  Following her surgery in 2012, [name] was diagnosed with a left IJ blood clot that required treatment with "blood thinners" for approximately 3 months. She reports that she had a pic line placed prior to her surgery, but no central lines "in her neck" during that admission.  She has never previously experienced lower extremity DVTs or other episodes of thromboembolism.  She denies any family history of blood clots.  [name] is currently on Cimzia and 6-MP.  She was briefly treated with Humira, but her insurance would not cover the cost.  Prior to pregnancy, she was taking Flagyl and Cipro for her draining abscess.  She is currently on Amoxicillin - Cipro was discontinued once it was determined that she was pregnant.  Over the weekend, the patient was seen in the ED for what she felt was a developing bowel obstruction.  Of note, the LFTs were mildly elevated (a new finding).  Her symptoms have since improved, although she does report some weight loss and morning sickness symptoms.  She continues to have some drainage from her rectal abscess, but denies diarrhea or  blood in her stools. Of note, [name] reports a history of severe allergy to Iron Dextran that was previously given for iron deficiency anemia.    OB History: OB History   Grav Para Term Preterm Abortions TAB SAB Ect Mult Living   1               PMH:  Past Medical History  Diagnosis Date  . 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 Anxiety   . Candida esophagitis   . Ovarian cyst, left   . Anemia     iron def  . DVT (deep venous thrombosis) 08/2010    Left IJ (postop) anticoag x 3 mo  . SVT (supraventricular tachycardia)   . Depression   . Small bowel obstruction   . Acute renal failure   . MRSA (methicillin resistant Staphylococcus aureus)     multiple abscesses  . Allergy   . Blood transfusion   . Hypertension   . Perianal fistula   . 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 Gastroparesis     PSH:  Past Surgical History  Procedure Laterality Date  . Cholecystectomy  2009  . Closure of ileostomy/loop  08/2010  . Colectomy with diverting loop ileostomy    . Appendectomy    . Esophagogastroduodenoscopy  11/11/2011    Procedure: ESOPHAGOGASTRODUODENOSCOPY (EGD);  Surgeon: Lafayette Dragon, MD;  Location: [facility];  Service: Endoscopy;  Laterality: N/A;  .  Examination under anesthesia  11/12/2011    Procedure: EXAM UNDER ANESTHESIA;  Surgeon: Rolm Bookbinder, MD;  Location: [facility];  Service: General;  Laterality: N/A;  . Proctoscopy  11/12/2011    Procedure: PROCTOSCOPY;  Surgeon: Rolm Bookbinder, MD;  Location: [facility];  Service: General;  Laterality: N/A;  . Incision and drainage perirectal abscess  11/12/2011    Procedure: IRRIGATION AND DEBRIDEMENT PERIRECTAL ABSCESS;  Surgeon: Rolm Bookbinder, MD;  Location: [facility];  Service: General;  Laterality: N/A;  . Incision and drainage perirectal abscess  12/11/2011    Procedure: IRRIGATION AND DEBRIDEMENT PERIRECTAL ABSCESS;  Surgeon: [name], MD;   Location: [facility];  Service: General;  Laterality: N/A;   Meds: Current outpatient prescriptions:Certolizumab Pegol (CIMZIA PREFILLED) 2 X 200 MG/ML KIT, Inject 200 mg into the skin every 14 (fourteen) days., Disp: 2 kit, Rfl: 3;  Doxylamine-Pyridoxine (DICLEGIS) 10-10 MG TBEC, Take by mouth., Disp: , Rfl: ;  mercaptopurine (PURINETHOL) 50 MG tablet, Take 100 mg by mouth daily., Disp: , Rfl: ;  acetaminophen (TYLENOL) 500 MG tablet, Take 1,000 mg by mouth every 6 (six) hours as needed for pain., Disp: , Rfl:  amoxicillin (AMOXIL) 500 MG tablet, Take 1 tablet (500 mg total) by mouth daily., Disp: 10 tablet, Rfl: 0;  calcium carbonate (TUMS - DOSED IN MG ELEMENTAL CALCIUM) 500 MG chewable tablet, Chew 1 tablet by mouth as needed for heartburn., Disp: , Rfl: ;  nitrofurantoin, macrocrystal-monohydrate, (MACROBID) 100 MG capsule, Take 1 capsule (100 mg total) by mouth 2 (two) times daily. X 7 days, Disp: 14 capsule, Rfl: 0 promethazine (PHENERGAN) 25 MG tablet, Take 25 mg by mouth every 6 (six) hours as needed for nausea., Disp: , Rfl:   Allergies:  Allergies  Allergen Reactions  . Iron Dextran Anaphylaxis  . Iron     REACTION: IV iron dextran   FH: denies family history of birth defects, hereditary disorders or thromboembolism  Soc: denies tobacco or ETOH use  Review of Systems: no vaginal bleeding or cramping/contractions, no LOF, no nausea/vomiting. All other systems reviewed and are negative.  PE:   Filed Vitals:   01/25/13 1521  BP: 132/80  Pulse: 102    Labs: CBC    Component Value Date/Time   WBC 9.7 01/23/2013 1710   WBC 8.8 02/18/2010 1210   RBC 4.42 01/23/2013 1710   RBC 4.09 02/18/2010 1210   HGB 11.8* 01/23/2013 1710   HGB 10.5* 02/18/2010 1210   HCT 37.2 01/23/2013 1710   HCT 32.5* 02/18/2010 1210   PLT 393 01/23/2013 1710   PLT 451* 02/18/2010 1210   MCV 84.2 01/23/2013 1710   MCV 79.4* 02/18/2010 1210   MCH 26.7 01/23/2013 1710   MCH 25.6 02/18/2010 1210   MCHC 31.7 01/23/2013 1710    MCHC 32.3 02/18/2010 1210   RDW 15.8* 01/23/2013 1710   RDW 17.6* 02/18/2010 1210   LYMPHSABS 1.9 01/23/2013 1710   LYMPHSABS 1.7 02/18/2010 1210   MONOABS 0.5 01/23/2013 1710   MONOABS 0.7 02/18/2010 1210   EOSABS 0.1 01/23/2013 1710   EOSABS 0.2 02/18/2010 1210   BASOSABS 0.0 01/23/2013 1710   BASOSABS 0.0 02/18/2010 1210   CMP     Component Value Date/Time   NA 132* 01/23/2013 1710   K 3.3* 01/23/2013 1710   CL 97 01/23/2013 1710   CO2 23 01/23/2013 1710   GLUCOSE 82 01/23/2013 1710   BUN 6 01/23/2013 1710   CREATININE 0.57 01/23/2013 1710   CALCIUM 9.8 01/23/2013  1710   PROT 8.3 01/23/2013 1710   ALBUMIN 3.5 01/23/2013 1710   AST 58* 01/23/2013 1710   ALT 110* 01/23/2013 1710   ALKPHOS 45 01/23/2013 1710   BILITOT 0.4 01/23/2013 1710   GFRNONAA >90 01/23/2013 1710   GFRAA >90 01/23/2013 1710     A/P: 1) Single IUP at 10 0/7 weeks  2) Crohn's disease - involving duodenum, ileum, colon, rectum with draining perirectal abscess.  Cimza is a TNF alpha blocker of which little is transferred to the fetus and although little data is available in pregnancy, would not anticipate any issues.  Although there are concerns about 6-MP in pregnancy, human case reports and epidemiology studies have not found a malformation syndrome with this medication.  Given the extent of her duodenal Crohn's, would recommend resuming PPIs.    Regarding the management of Crohn's during pregnancy - would continue the same plan as if she were non-pregnant.  She will need close follow up with her GI physician and would have a low threshold to involve General Surgery if there is any concern for bowel obstruction or surgical abdomen.  Recommend ultrasound for anatomic survey at 18 weeks and serial growth scans thereafter every 4-6 weeks and would start antepartum fetal testing at 32 weeks if marginal fetal growth is noted.  Please contact our office if you would like any of these procedures performed here.  Given the extent of her rectal  involvement, would recommend cesarean delivery to avoid risk of rectovaginal fistula development.  Given the extent of her previous surgeries, it may be reasonable to notify General Surgery to assist if needed.  3) Previous Left IJ blood clot - based on the patient's history, suspect that this was related to her surgery, and does not appear to be a spontaneous event.  She has had no previous history of thromboembolism.  Based on this, I would not recommend Lovenox prophylaxis or further work up at this time.  4) Elevated LFTs - uncertain etiology - this is apparently a new finding.  LFTs may be mildly elevated with hyperemesis gravidarum.  She previously had a negative Hep C and is HepB surface antigen negative.  Would recommend repeating these labs in several weeks - if persistently elevated, may need additional work up.   Thank you for the opportunity to be a part of the care of [name]. Please contact our office if we can be of further assistance.   I spent approximately 30 minutes with this patient with over 50% of time spent in face-to-face counseling.  Benjaman Lobe, MD Maternal Fetal Medicine

## 2013-02-14 ENCOUNTER — Encounter: Payer: Self-pay | Admitting: *Deleted

## 2013-02-14 ENCOUNTER — Ambulatory Visit (INDEPENDENT_AMBULATORY_CARE_PROVIDER_SITE_OTHER): Payer: Medicaid Other | Admitting: Internal Medicine

## 2013-02-14 DIAGNOSIS — K5090 Crohn's disease, unspecified, without complications: Secondary | ICD-10-CM

## 2013-02-16 LAB — TB SKIN TEST: TB Skin Test: NEGATIVE

## 2013-03-23 ENCOUNTER — Other Ambulatory Visit (INDEPENDENT_AMBULATORY_CARE_PROVIDER_SITE_OTHER): Payer: Medicaid Other

## 2013-03-23 ENCOUNTER — Ambulatory Visit (INDEPENDENT_AMBULATORY_CARE_PROVIDER_SITE_OTHER): Payer: Medicaid Other | Admitting: Internal Medicine

## 2013-03-23 ENCOUNTER — Encounter: Payer: Self-pay | Admitting: Internal Medicine

## 2013-03-23 VITALS — BP 110/78 | HR 72 | Ht 64.0 in | Wt 226.2 lb

## 2013-03-23 DIAGNOSIS — K5090 Crohn's disease, unspecified, without complications: Secondary | ICD-10-CM

## 2013-03-23 DIAGNOSIS — K612 Anorectal abscess: Secondary | ICD-10-CM

## 2013-03-23 DIAGNOSIS — Z331 Pregnant state, incidental: Secondary | ICD-10-CM

## 2013-03-23 DIAGNOSIS — K610 Anal abscess: Secondary | ICD-10-CM

## 2013-03-23 DIAGNOSIS — D849 Immunodeficiency, unspecified: Secondary | ICD-10-CM

## 2013-03-23 LAB — CBC WITH DIFFERENTIAL/PLATELET
Basophils Absolute: 0 10*3/uL (ref 0.0–0.1)
Eosinophils Absolute: 0.1 10*3/uL (ref 0.0–0.7)
Hemoglobin: 11 g/dL — ABNORMAL LOW (ref 12.0–15.0)
Lymphocytes Relative: 15.5 % (ref 12.0–46.0)
MCHC: 33.7 g/dL (ref 30.0–36.0)
MCV: 84.3 fl (ref 78.0–100.0)
Monocytes Absolute: 0.6 10*3/uL (ref 0.1–1.0)
Neutro Abs: 5.9 10*3/uL (ref 1.4–7.7)
Neutrophils Relative %: 75.4 % (ref 43.0–77.0)
RDW: 18.3 % — ABNORMAL HIGH (ref 11.5–14.6)

## 2013-03-23 MED ORDER — ADALIMUMAB 40 MG/0.8ML ~~LOC~~ KIT: 40.0000 mg | PACK | SUBCUTANEOUS | Status: DC

## 2013-03-23 MED ORDER — MERCAPTOPURINE 50 MG PO TABS: 100.0000 mg | ORAL_TABLET | Freq: Every day | ORAL | Status: DC

## 2013-03-23 MED ORDER — ESOMEPRAZOLE MAGNESIUM 40 MG PO CPDR: 40.0000 mg | DELAYED_RELEASE_CAPSULE | Freq: Two times a day (BID) | ORAL | Status: DC

## 2013-03-23 NOTE — Progress Notes (Signed)
[name] [birth year] MRN [id number]   History of Present Illness:  This is a 22 year old African American female with complicated Crohn's disease of the small bowel, colon, terminal ileum and rectum. She is currently in her 18th week of intrauterine pregnancy. She is followed by Dr. Charlesetta Garibaldi. She also follow up for a high risk pregnancy with Dr. Ola Spurr who told her to continue 6 MP and switch to Humira as well as resume her Nexium 40 mg twice a day. He confirmed that it is safe to continue these medications in pregnancy.. She has no specific symptoms today. She is somewhat constipated. 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 She discontinued Flagyl and Cipro. There has been no pain. Her last colonoscopy in September 2013 showed anastomotic ulcers consistent with ileitis and chronic colitis. She is up-to-date on her TB skin test which was 02/16/2013. She has a history of an iron infusion reaction, history of DVT and history of C. difficile colitis.   Past Medical History  Diagnosis Date  . 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 Anxiety   . Candida esophagitis   . Ovarian cyst, left   . Anemia     iron def  . DVT (deep venous thrombosis) 08/2010    Left IJ (postop) anticoag x 3 mo  . SVT (supraventricular tachycardia)   . Depression   . Small bowel obstruction   . Acute renal failure   . MRSA (methicillin resistant Staphylococcus aureus)     multiple abscesses  . Allergy   . Blood transfusion   . Hypertension   . Perianal fistula   . 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 Gastroparesis    Past Surgical History  Procedure Laterality Date  . Cholecystectomy  2009  . Closure of ileostomy/loop  08/2010  . Colectomy with diverting loop ileostomy    . Appendectomy    . Esophagogastroduodenoscopy  11/11/2011    Procedure: ESOPHAGOGASTRODUODENOSCOPY (EGD);  Surgeon: Lafayette Dragon, MD;   Location: [facility];  Service: Endoscopy;  Laterality: N/A;  . Examination under anesthesia  11/12/2011    Procedure: EXAM UNDER ANESTHESIA;  Surgeon: Rolm Bookbinder, MD;  Location: [facility];  Service: General;  Laterality: N/A;  . Proctoscopy  11/12/2011    Procedure: PROCTOSCOPY;  Surgeon: Rolm Bookbinder, MD;  Location: [facility];  Service: General;  Laterality: N/A;  . Incision and drainage perirectal abscess  11/12/2011    Procedure: IRRIGATION AND DEBRIDEMENT PERIRECTAL ABSCESS;  Surgeon: Rolm Bookbinder, MD;  Location: [facility];  Service: General;  Laterality: N/A;  . Incision and drainage perirectal abscess  12/11/2011    Procedure: IRRIGATION AND DEBRIDEMENT PERIRECTAL ABSCESS;  Surgeon: [name], MD;  Location: [facility];  Service: General;  Laterality: N/A;    reports that she has never smoked. She has never used smokeless tobacco. She reports that she does not drink alcohol or use illicit drugs. family history includes Diabetes in her cousin and maternal uncle; Emphysema in her maternal grandmother; Heart attack in her maternal grandmother; Heart disease in her maternal grandmother; Sickle cell trait in her father. There is no history of Colon cancer. Allergies  Allergen Reactions  . Iron Dextran Anaphylaxis  . Iron     REACTION: IV iron dextran        Review of Systems: Positive for heartburn negative for abdominal pain. Positive for constipation  The remainder of the  10 point ROS is negative except as outlined in H&P   Physical Exam: General appearance  Well developed, in no distress. Mildly overweight pregnant Eyes- non icteric. HEENT nontraumatic, normocephalic. Mouth no lesions, tongue papillated, no cheilosis. Neck supple without adenopathy, thyroid not enlarged, no carotid bruits, no JVD. Lungs Clear to auscultation bilaterally. Cor normal S1, normal S2, regular rhythm, no murmur,  quiet precordium. Abdomen: Protuberant abdomen with normoactive bowel sounds. Urine  fundus above the umbilicus. No mass or tenderness. Rectal: Not done. Extremities no pedal edema. Skin no lesions. Neurological alert and oriented x 3. Psychological normal mood and affect.  Assessment and Plan:  Problem #56 22 year old African American female in her 18th week of intrauterine pregnancy. She is doing well. I suggest a C-section to avoid problems with rectovaginal fistula which I anticipate will still be there at the time of her delivery in February 2015.She has been followed as a high risk pregnancy. She has seen Dr Ola Spurr for consultation.  Problem #2 Complicated Crohn's disease. She is to continue 6 MP. We will switch her to Humira from Cimzia. She will resume Nexium 40 mg twice a day. She will need a follow up office visit in 2 months. We will check hepatitis and A and B serologies and CBC. She is to continue prenatal vitamins with iron.   03/23/2013 [name]

## 2013-03-23 NOTE — Patient Instructions (Addendum)
Your physician has requested that you go to the basement for the following lab work before leaving today: Hepatitis A, Hepatitis B, CBC  We have sent the following medications to your pharmacy for you to pick up at your convenience: 6 mp, Nexium twice daily  We will start you back on Humira. Take your cimzia until we get the Humira ordered. (per Dr [name], no induction of Humira is needed. Just restart maintanence therapy every 2 weeks.)  Please follow up with Dr [name] in 2 months.   CC: Dr Charlesetta Garibaldi, Dr Kris Hartmann, Dr Ola Spurr, nenonatology

## 2013-03-24 ENCOUNTER — Encounter: Payer: Self-pay | Admitting: Internal Medicine

## 2013-03-24 LAB — HEPATITIS B SURFACE ANTIGEN: Hepatitis B Surface Ag: NEGATIVE

## 2013-03-24 LAB — HEPATITIS A ANTIBODY, TOTAL: Hep A Total Ab: NEGATIVE

## 2013-03-24 LAB — HEPATITIS B SURFACE ANTIBODY,QUALITATIVE: Hep B S Ab: POSITIVE — AB

## 2013-03-24 NOTE — Progress Notes (Signed)
Prior Josem Kaufmann has been completed with patient's insurance company for Nexium. PA # [id number].

## 2013-03-31 ENCOUNTER — Observation Stay (HOSPITAL_COMMUNITY)
Admission: EM | Admit: 2013-03-31 | Discharge: 2013-04-02 | Disposition: A | Discharge status: Home / Self Care | Payer: Medicaid Other | Attending: General Surgery | Admitting: General Surgery

## 2013-03-31 ENCOUNTER — Telehealth: Payer: Self-pay | Admitting: Internal Medicine

## 2013-03-31 ENCOUNTER — Encounter (HOSPITAL_COMMUNITY): Payer: Self-pay | Admitting: Emergency Medicine

## 2013-03-31 DIAGNOSIS — Z79899 Other long term (current) drug therapy: Secondary | ICD-10-CM | POA: Insufficient documentation

## 2013-03-31 DIAGNOSIS — K5090 Crohn's disease, unspecified, without complications: Secondary | ICD-10-CM | POA: Insufficient documentation

## 2013-03-31 DIAGNOSIS — O98819 Other maternal infectious and parasitic diseases complicating pregnancy, unspecified trimester: Principal | ICD-10-CM | POA: Insufficient documentation

## 2013-03-31 DIAGNOSIS — Z3490 Encounter for supervision of normal pregnancy, unspecified, unspecified trimester: Secondary | ICD-10-CM

## 2013-03-31 DIAGNOSIS — Z9089 Acquired absence of other organs: Secondary | ICD-10-CM | POA: Insufficient documentation

## 2013-03-31 DIAGNOSIS — Z86718 Personal history of other venous thrombosis and embolism: Secondary | ICD-10-CM | POA: Insufficient documentation

## 2013-03-31 DIAGNOSIS — K612 Anorectal abscess: Secondary | ICD-10-CM | POA: Insufficient documentation

## 2013-03-31 DIAGNOSIS — O99891 Other specified diseases and conditions complicating pregnancy: Secondary | ICD-10-CM | POA: Insufficient documentation

## 2013-03-31 DIAGNOSIS — I10 Essential (primary) hypertension: Secondary | ICD-10-CM | POA: Insufficient documentation

## 2013-03-31 DIAGNOSIS — K611 Rectal abscess: Secondary | ICD-10-CM | POA: Diagnosis present

## 2013-03-31 HISTORY — DX: Rectal abscess: K61.1

## 2013-03-31 LAB — BASIC METABOLIC PANEL
BUN: 6 mg/dL (ref 6–23)
Chloride: 102 mEq/L (ref 96–112)
GFR calc Af Amer: >90 mL/min (ref >90)
GFR calc non Af Amer: >90 mL/min (ref >90)
Potassium: 3.4 mEq/L — ABNORMAL LOW (ref 3.5–5.1)
Sodium: 135 mEq/L (ref 135–145)

## 2013-03-31 LAB — CBC WITH DIFFERENTIAL/PLATELET
Basophils Absolute: 0 10*3/uL (ref 0.0–0.1)
Basophils Relative: 0 % (ref 0–1)
Hemoglobin: 10.9 g/dL — ABNORMAL LOW (ref 12.0–15.0)
MCHC: 34.3 g/dL (ref 30.0–36.0)
Monocytes Relative: 7 % (ref 3–12)
Neutro Abs: 7.6 10*3/uL (ref 1.7–7.7)
Neutrophils Relative %: 75 % (ref 43–77)
Platelets: 397 10*3/uL (ref 150–400)

## 2013-03-31 MED ORDER — KCL IN DEXTROSE-NACL 20-5-0.45 MEQ/L-%-% IV SOLN
INTRAVENOUS | Status: DC
  Administered 2013-03-31 – 2013-04-02 (×3): via INTRAVENOUS
  Filled 2013-03-31 (×5): qty 1000

## 2013-03-31 MED ORDER — ONDANSETRON HCL 4 MG/2ML IJ SOLN
4.0000 mg | Freq: Four times a day (QID) | INTRAMUSCULAR | Status: DC | PRN
  Filled 2013-03-31: qty 2

## 2013-03-31 MED ORDER — PIPERACILLIN-TAZOBACTAM 3.375 G IVPB
3.3750 g | Freq: Three times a day (TID) | INTRAVENOUS | Status: DC
  Administered 2013-03-31 – 2013-04-02 (×5): 3.375 g via INTRAVENOUS
  Filled 2013-03-31 (×8): qty 50

## 2013-03-31 MED ORDER — PRENATAL 27-0.8 MG PO TABS
1.0000 | ORAL_TABLET | Freq: Every day | ORAL | Status: DC
  Administered 2013-04-01: 1 via ORAL
  Filled 2013-03-31 (×2): qty 1

## 2013-03-31 MED ORDER — HEPARIN SODIUM (PORCINE) 5000 UNIT/ML IJ SOLN
5000.0000 [IU] | Freq: Three times a day (TID) | INTRAMUSCULAR | Status: DC
  Administered 2013-03-31 – 2013-04-02 (×5): 5000 [IU] via SUBCUTANEOUS
  Filled 2013-03-31 (×8): qty 1

## 2013-03-31 MED ORDER — MORPHINE SULFATE 2 MG/ML IJ SOLN
1.0000 mg | INTRAMUSCULAR | Status: DC | PRN
  Administered 2013-04-01: 2 mg via INTRAVENOUS
  Filled 2013-03-31: qty 1

## 2013-03-31 MED ORDER — ACETAMINOPHEN 500 MG PO TABS
1000.0000 mg | ORAL_TABLET | Freq: Four times a day (QID) | ORAL | Status: DC | PRN
  Administered 2013-04-01: 1000 mg via ORAL
  Filled 2013-03-31: qty 2

## 2013-03-31 NOTE — Telephone Encounter (Signed)
Spoke with patient and she states the abscess she had last year seems to be getting larger again. States it is about the size of a plum and is sore to touch. She reports she soaked last night and it seem to get larger. Uncomfortable to sit. Discussed with Alonza Bogus, PA. Patient instructed to call her surgeon to be seen for this. Patient will do this.

## 2013-03-31 NOTE — ED Notes (Signed)
Pt states has a perianal abscess on L side, states had this happen twice last year, and within the past week pt has developed another one, states painful to sit down, states it's draining bloody, puss, clear like drainage.

## 2013-03-31 NOTE — H&P (Signed)
Re:   [name] DOB:   January 02, 1991 MRN:   [id number]  [facility] Admission  ASSESSMENT AND PLAN: 1.  Left perianal/rectal abscess  Recurrent.  She probably has an underlying fistula in ano by her history.  Will plan I&D and EUA in am.  Dr. Barry Dienes is our surgeon of the week and I will discuss with her in the AM.  2.  Crohn's disease - of small bowel, colon, terminal ileum and rectum  Sees Dr. [name] - on 6MP and Humira 3.  Pregnant - 19 weeks  Followed by Dr. Charlesetta Garibaldi.  Saw Dr. Benjaman Lobe - 01/25/2013. 4.  History of rectovaginal fistula 5.  History of left IJ blood clot 6.  History of partial SBO secondary to her prior abdominal surgery and Crohn's - 02/2012  Chief Complaint  Patient presents with  . Abscess    perianal    REFERRING PHYSICIAN: Gwendolyn Grant, MD  HISTORY OF PRESENT ILLNESS: [name] is a 22 y.o. (DOB: 05-17-1991)  AA  female whose primary care physician is Anita Grant, MD and comes to the [facility] with increasing perianal pain. Mom, [name], in room.  Diagnosed with Crohn's at age 22. She had a bowel resection in Dec 2011 by Dr. Brantley Stage, he reversed her in Mar 2012.  Since then she has had two prior I&D of perirectal disease - Dr. Donne Hazel 11/12/2011 and Dr. Harlow Asa 12/11/2011.  Since her last I&D, she says that the wound has never really healed and she has had chronic drainage.  She tried antibiotics this summer, but it made things worse.  I spoke to pharmacist, Lyndee Leo, about antibiotic coverage.  I tried to call Dr. Berneta Sages service, but went into a voice mail black hole.  Past Medical History  Diagnosis Date  . 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 Anxiety   . Candida esophagitis   . Ovarian cyst, left   . Anemia     iron def  . DVT (deep venous thrombosis) 08/2010    Left IJ (postop) anticoag x 3 mo  . SVT (supraventricular tachycardia)   . Depression   . Small bowel obstruction   .  Acute renal failure   . MRSA (methicillin resistant Staphylococcus aureus)     multiple abscesses  . Allergy   . Blood transfusion   . Hypertension   . Perianal fistula   . 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 Gastroparesis       Past Surgical History  Procedure Laterality Date  . Cholecystectomy  2009  . Closure of ileostomy/loop  08/2010  . Colectomy with diverting loop ileostomy    . Appendectomy    . Esophagogastroduodenoscopy  11/11/2011    Procedure: ESOPHAGOGASTRODUODENOSCOPY (EGD);  Surgeon: Lafayette Dragon, MD;  Location: [facility];  Service: Endoscopy;  Laterality: N/A;  . Examination under anesthesia  11/12/2011    Procedure: EXAM UNDER ANESTHESIA;  Surgeon: Rolm Bookbinder, MD;  Location: [facility];  Service: General;  Laterality: N/A;  . Proctoscopy  11/12/2011    Procedure: PROCTOSCOPY;  Surgeon: Rolm Bookbinder, MD;  Location: [facility];  Service: General;  Laterality: N/A;  . Incision and drainage perirectal abscess  11/12/2011    Procedure: IRRIGATION AND DEBRIDEMENT PERIRECTAL ABSCESS;  Surgeon: Rolm Bookbinder, MD;  Location: [facility];  Service: General;  Laterality: N/A;  . Incision and drainage perirectal abscess  12/11/2011    Procedure:  IRRIGATION AND DEBRIDEMENT PERIRECTAL ABSCESS;  Surgeon: [name], MD;  Location: [facility];  Service: General;  Laterality: N/A;      No current facility-administered medications for this encounter.   Current Outpatient Prescriptions  Medication Sig Dispense Refill  . Certolizumab Pegol (CIMZIA PREFILLED) 2 X 200 MG/ML KIT Inject 200 mg into the skin every 14 (fourteen) days.      Marland Kitchen mercaptopurine (PURINETHOL) 50 MG tablet Take 100 mg by mouth daily.      . Prenatal Vit-Fe Fumarate-FA (MULTIVITAMIN-PRENATAL) 27-0.8 MG TABS tablet Take 1 tablet by mouth daily at 12 noon.      Marland Kitchen acetaminophen (TYLENOL) 500 MG tablet Take 1,000 mg by mouth every 6 (six) hours as needed for pain.      Marland Kitchen adalimumab (HUMIRA) 40  MG/0.8ML injection Inject 0.8 mLs (40 mg total) into the skin every 14 (fourteen) days.  6 each  1  . promethazine (PHENERGAN) 25 MG tablet Take 25 mg by mouth every 6 (six) hours as needed for nausea.          Allergies  Allergen Reactions  . Iron Dextran Anaphylaxis  . Iron     REACTION: IV iron dextran    REVIEW OF SYSTEMS: Skin:  No history of rash.  No history of abnormal moles. Infection:  No history of hepatitis or HIV.  No history of MRSA. Neurologic:  No history of stroke.  No history of seizure.  No history of headaches. Cardiac:  No history of hypertension. No history of heart disease.  No history of prior cardiac catheterization.  No history of seeing a cardiologist. Pulmonary:  Does not smoke cigarettes.  No asthma or bronchitis.  No OSA/CPAP.  Endocrine:  No diabetes. No thyroid disease. Gastrointestinal:  See HPI.  She had a cholecystectomy in 2009. Urologic:  No history of kidney stones.  No history of bladder infections. Musculoskeletal:  No history of joint or back disease. Hematologic:  Left IJ clot in Jan 2012- associated with her bowel surgery. Psycho-social:  The patient is oriented.   The patient has no obvious psychologic or social impairment to understanding our conversation and plan.  SOCIAL and FAMILY HISTORY: Unmarried. First pregnancy. Was in school until pregnancy.  PHYSICAL EXAM: BP 140/67  Pulse 99  Temp(Src) 99.3 F (37.4 C) (Oral)  Resp 20  SpO2 100%  LMP 11/16/2012  General: AA F who is alert and generally healthy appearing.  HEENT: Normal. Pupils equal. Neck: Supple. No mass.  No thyroid mass. Lymph Nodes:  No supraclavicular or cervical nodes. Lungs: Clear to auscultation and symmetric breath sounds. Heart:  RRR. No murmur or rub.  Abdomen: Soft. No mass. No tenderness. Well healed midline scar and left abdominal scar. Rectal: Draining sinus left lateral anus. 5 cm area of fluctuance in left lateral anus. Tender rectal  exam. Extremities:  Good strength and ROM  in upper and lower extremities. Neurologic:  Grossly intact to motor and sensory function. Psychiatric: Has normal mood and affect. Behavior is normal.   DATA REVIEWED: Notes in [place], MD,  [facility], [place] [address] [place].,  [place], [place]    [place] Phone:  [place]:  [phone]

## 2013-03-31 NOTE — ED Provider Notes (Signed)
CSN: [id number]     Arrival date & time 03/31/13  1742 History   First MD Initiated Contact with Patient 03/31/13 1756     Chief Complaint  Patient presents with  . Abscess    perianal     HPI  [name] is a very pleasant young female is currently [redacted] weeks pregnant. She has a complicated history involving Crohn's disease. She has multiple small bowel resections. She has a history of multiple perirectal abscesses that require operative debridement. 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 Her most recent surgical procedure regarding her abscess was by the 13th. She underwent incision and drainage and removal of a perirectal abscess and fistula  Past Medical History  Diagnosis Date  . 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 Anxiety   . Candida esophagitis   . Ovarian cyst, left   . Anemia     iron def  . DVT (deep venous thrombosis) 08/2010    Left IJ (postop) anticoag x 3 mo  . SVT (supraventricular tachycardia)   . Depression   . Small bowel obstruction   . Acute renal failure   . MRSA (methicillin resistant Staphylococcus aureus)     multiple abscesses  . Allergy   . Blood transfusion   . Hypertension   . Perianal fistula   . 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 Gastroparesis    Past Surgical History  Procedure Laterality Date  . Cholecystectomy  2009  . Closure of ileostomy/loop  08/2010  . Colectomy with diverting loop ileostomy    . Appendectomy    . Esophagogastroduodenoscopy  11/11/2011    Procedure: ESOPHAGOGASTRODUODENOSCOPY (EGD);  Surgeon: Lafayette Dragon, MD;  Location: [facility];  Service: Endoscopy;  Laterality: N/A;  . Examination under anesthesia  11/12/2011    Procedure: EXAM UNDER ANESTHESIA;  Surgeon: Rolm Bookbinder, MD;  Location: [facility];  Service: General;  Laterality: N/A;  . Proctoscopy  11/12/2011    Procedure: PROCTOSCOPY;   Surgeon: Rolm Bookbinder, MD;  Location: [facility];  Service: General;  Laterality: N/A;  . Incision and drainage perirectal abscess  11/12/2011    Procedure: IRRIGATION AND DEBRIDEMENT PERIRECTAL ABSCESS;  Surgeon: Rolm Bookbinder, MD;  Location: [facility];  Service: General;  Laterality: N/A;  . Incision and drainage perirectal abscess  12/11/2011    Procedure: IRRIGATION AND DEBRIDEMENT PERIRECTAL ABSCESS;  Surgeon: [name], MD;  Location: [facility];  Service: General;  Laterality: N/A;   Family History  Problem Relation Age of Onset  . Diabetes Maternal Uncle   . Heart attack Maternal Grandmother   . Heart disease Maternal Grandmother   . Emphysema Maternal Grandmother   . Colon cancer Neg Hx   . Diabetes Cousin   . Sickle cell trait Father    History  Substance Use Topics  . Smoking status: Never Smoker   . Smokeless tobacco: Never Used  . Alcohol Use: No   OB History   Grav Para Term Preterm Abortions TAB SAB Ect Mult Living   1              Review of Systems  Constitutional: Negative for fever, chills, diaphoresis, appetite change and fatigue.  HENT: Negative for sore throat, mouth sores and trouble swallowing.   Eyes: Negative for visual disturbance.  Respiratory: Negative for cough, chest tightness, shortness of breath and wheezing.  Cardiovascular: Negative for chest pain.  Gastrointestinal: Negative for nausea, vomiting, abdominal pain, diarrhea and abdominal distention.       Perirectal pain. She's not had drainage from a fistula for more than 2 days  Endocrine: Negative for polydipsia, polyphagia and polyuria.  Genitourinary: Negative for dysuria, frequency and hematuria.  Musculoskeletal: Negative for gait problem.  Skin: Negative for color change, pallor and rash.  Neurological: Negative for dizziness, syncope, light-headedness and headaches.  Hematological: Does not bruise/bleed easily.  Psychiatric/Behavioral: Negative for behavioral problems and confusion.       Allergies  Iron dextran and Iron  Home Medications   Current Outpatient Rx  Name  Route  Sig  Dispense  Refill  . Certolizumab Pegol (CIMZIA PREFILLED) 2 X 200 MG/ML KIT   Subcutaneous   Inject 200 mg into the skin every 14 (fourteen) days.         Marland Kitchen mercaptopurine (PURINETHOL) 50 MG tablet   Oral   Take 100 mg by mouth daily.         . Prenatal Vit-Fe Fumarate-FA (MULTIVITAMIN-PRENATAL) 27-0.8 MG TABS tablet   Oral   Take 1 tablet by mouth daily at 12 noon.         Marland Kitchen acetaminophen (TYLENOL) 500 MG tablet   Oral   Take 1,000 mg by mouth every 6 (six) hours as needed for pain.         Marland Kitchen adalimumab (HUMIRA) 40 MG/0.8ML injection   Subcutaneous   Inject 0.8 mLs (40 mg total) into the skin every 14 (fourteen) days.   6 each   1   . promethazine (PHENERGAN) 25 MG tablet   Oral   Take 25 mg by mouth every 6 (six) hours as needed for nausea.          BP 140/67  Pulse 99  Temp(Src) 99.3 F (37.4 C) (Oral)  Resp 20  SpO2 100%  LMP 11/16/2012 Physical Exam  Constitutional: She is oriented to person, place, and time. She appears well-developed and well-nourished. No distress.  HENT:  Head: Normocephalic.  Eyes: Conjunctivae are normal. Pupils are equal, round, and reactive to light. No scleral icterus.  Neck: Normal range of motion. Neck supple. No thyromegaly present.  Cardiovascular: Normal rate and regular rhythm.  Exam reveals no gallop and no friction rub.   No murmur heard. Pulmonary/Chest: Effort normal and breath sounds normal. No respiratory distress. She has no wheezes. She has no rales.  Abdominal: Soft. Bowel sounds are normal. She exhibits no distension. There is no tenderness. There is no rebound.  Perirectal exam shows a large firm fluctuant (rectal abscess. There is a sinus more medial to abscesses adjacent her rectum and anus. There is no drainage from the sinus.  Musculoskeletal: Normal range of motion.  Neurological: She is alert and  oriented to person, place, and time.  Skin: Skin is warm and dry. No rash noted.  Psychiatric: She has a normal mood and affect. Her behavior is normal.    ED Course  Procedures (including critical care time) Labs Review Labs Reviewed  CBC WITH DIFFERENTIAL - Abnormal; Notable for the following:    RBC 3.82 (*)    Hemoglobin 10.9 (*)    HCT 31.8 (*)    RDW 16.5 (*)    All other components within normal limits  BASIC METABOLIC PANEL - Abnormal; Notable for the following:    Potassium 3.4 (*)    All other components within normal limits   Imaging Review No results  found.  MDM   1. Perirectal abscess   2. Pregnancy    Dr. Lucia Gaskins general surgery. Dr. Lucia Gaskins as always return my call immediately. He plans on ER evaluation the patient.  Her last meal was at 2 PM. I have asked that she remained n.p.o. here.    Lebron Quam, MD 03/31/13 [phone]

## 2013-04-01 ENCOUNTER — Encounter (HOSPITAL_COMMUNITY): Payer: Self-pay | Admitting: General Surgery

## 2013-04-01 ENCOUNTER — Encounter (HOSPITAL_COMMUNITY): Payer: Self-pay | Admitting: Anesthesiology

## 2013-04-01 ENCOUNTER — Observation Stay (HOSPITAL_COMMUNITY): Payer: Medicaid Other | Admitting: Anesthesiology

## 2013-04-01 ENCOUNTER — Encounter (HOSPITAL_COMMUNITY)
Admission: EM | Disposition: A | Discharge status: Home / Self Care | Payer: Self-pay | Source: Home / Self Care | Attending: Emergency Medicine

## 2013-04-01 HISTORY — PX: INCISION AND DRAINAGE PERIRECTAL ABSCESS: SHX1804

## 2013-04-01 HISTORY — PX: EXAMINATION UNDER ANESTHESIA: SHX1540

## 2013-04-01 SURGERY — EXAM UNDER ANESTHESIA
Anesthesia: General | Site: Anus | Wound class: Dirty or Infected

## 2013-04-01 MED ORDER — FENTANYL CITRATE 0.05 MG/ML IJ SOLN: 25.0000 ug | INTRAMUSCULAR | Status: DC | PRN

## 2013-04-01 MED ORDER — MERCAPTOPURINE 50 MG PO TABS
100.0000 mg | ORAL_TABLET | Freq: Every day | ORAL | Status: DC
  Administered 2013-04-01: 100 mg via ORAL
  Filled 2013-04-01 (×3): qty 2

## 2013-04-01 MED ORDER — 0.9 % SODIUM CHLORIDE (POUR BTL) OPTIME
TOPICAL | Status: DC | PRN
  Administered 2013-04-01: 1000 mL

## 2013-04-01 MED ORDER — PROMETHAZINE HCL 25 MG/ML IJ SOLN
6.2500 mg | INTRAMUSCULAR | Status: AC | PRN
  Administered 2013-04-01: 6.25 mg via INTRAVENOUS
  Administered 2013-04-01: 12.5 mg via INTRAVENOUS

## 2013-04-01 MED ORDER — PROMETHAZINE HCL 25 MG PO TABS
25.0000 mg | ORAL_TABLET | Freq: Four times a day (QID) | ORAL | Status: DC | PRN
  Administered 2013-04-01 – 2013-04-02 (×2): 25 mg via ORAL
  Filled 2013-04-01 (×2): qty 1

## 2013-04-01 MED ORDER — OXYCODONE-ACETAMINOPHEN 5-325 MG PO TABS
1.0000 | ORAL_TABLET | ORAL | Status: DC | PRN
  Administered 2013-04-01 – 2013-04-02 (×3): 2 via ORAL
  Filled 2013-04-01 (×3): qty 2

## 2013-04-01 MED ORDER — FENTANYL CITRATE 0.05 MG/ML IJ SOLN
INTRAMUSCULAR | Status: DC | PRN
  Administered 2013-04-01 (×2): 50 ug via INTRAVENOUS
  Administered 2013-04-01: 100 ug via INTRAVENOUS
  Administered 2013-04-01: 50 ug via INTRAVENOUS

## 2013-04-01 MED ORDER — LACTATED RINGERS IV SOLN: INTRAVENOUS | Status: DC

## 2013-04-01 MED ORDER — PROMETHAZINE HCL 25 MG/ML IJ SOLN
INTRAMUSCULAR | Status: AC
  Filled 2013-04-01: qty 1

## 2013-04-01 MED ORDER — PROPOFOL 10 MG/ML IV BOLUS
INTRAVENOUS | Status: DC | PRN
  Administered 2013-04-01: 200 mg via INTRAVENOUS

## 2013-04-01 MED ORDER — ONDANSETRON HCL 4 MG/2ML IJ SOLN
INTRAMUSCULAR | Status: DC | PRN
  Administered 2013-04-01: 4 mg via INTRAVENOUS

## 2013-04-01 MED ORDER — LIDOCAINE-EPINEPHRINE 1 %-1:100000 IJ SOLN
INTRAMUSCULAR | Status: AC
  Filled 2013-04-01: qty 1

## 2013-04-01 MED ORDER — BUPIVACAINE HCL (PF) 0.25 % IJ SOLN
INTRAMUSCULAR | Status: AC
  Filled 2013-04-01: qty 30

## 2013-04-01 MED ORDER — SUCCINYLCHOLINE CHLORIDE 20 MG/ML IJ SOLN
INTRAMUSCULAR | Status: DC | PRN
  Administered 2013-04-01: 100 mg via INTRAVENOUS

## 2013-04-01 MED ORDER — LACTATED RINGERS IV SOLN
INTRAVENOUS | Status: DC
  Administered 2013-04-01: 09:00:00 via INTRAVENOUS

## 2013-04-01 SURGICAL SUPPLY — 38 items
BLADE HEX COATED 2.75 (ELECTRODE) ×2 IMPLANT
BLADE SURG 15 STRL LF DISP TIS (BLADE) ×1 IMPLANT
BLADE SURG 15 STRL SS (BLADE) ×1
BRIEF STRETCH FOR OB PAD LRG (UNDERPADS AND DIAPERS) ×2 IMPLANT
CANISTER SUCTION 2500CC (MISCELLANEOUS) ×2 IMPLANT
CLOTH BEACON ORANGE TIMEOUT ST (SAFETY) ×2 IMPLANT
DECANTER SPIKE VIAL GLASS SM (MISCELLANEOUS) ×2 IMPLANT
DRAPE LAPAROTOMY T 102X78X121 (DRAPES) ×2 IMPLANT
ELECT REM PT RETURN 9FT ADLT (ELECTROSURGICAL) ×2
ELECTRODE REM PT RTRN 9FT ADLT (ELECTROSURGICAL) ×1 IMPLANT
GAUZE PACKING IODOFORM 1/2 (PACKING) ×2 IMPLANT
GAUZE SPONGE 4X4 16PLY XRAY LF (GAUZE/BANDAGES/DRESSINGS) ×2 IMPLANT
GLOVE BIO SURGEON STRL SZ 6 (GLOVE) ×2 IMPLANT
GLOVE BIOGEL PI IND STRL 6.5 (GLOVE) ×1 IMPLANT
GLOVE BIOGEL PI IND STRL 7.0 (GLOVE) ×1 IMPLANT
GLOVE BIOGEL PI INDICATOR 6.5 (GLOVE) ×1
GLOVE BIOGEL PI INDICATOR 7.0 (GLOVE) ×1
GLOVE INDICATOR 6.5 STRL GRN (GLOVE) ×2 IMPLANT
GOWN PREVENTION PLUS LG XLONG (DISPOSABLE) ×2 IMPLANT
GOWN PREVENTION PLUS XXLARGE (GOWN DISPOSABLE) ×2 IMPLANT
GOWN STRL REIN 2XL XLG LVL4 (GOWN DISPOSABLE) ×2 IMPLANT
KIT BASIN OR (CUSTOM PROCEDURE TRAY) ×2 IMPLANT
NEEDLE HYPO 22GX1.5 SAFETY (NEEDLE) ×2 IMPLANT
PACK BASIC VI WITH GOWN DISP (CUSTOM PROCEDURE TRAY) IMPLANT
PACK LITHOTOMY IV (CUSTOM PROCEDURE TRAY) ×2 IMPLANT
PAD ABD 7.5X8 STRL (GAUZE/BANDAGES/DRESSINGS) ×2 IMPLANT
PENCIL BUTTON HOLSTER BLD 10FT (ELECTRODE) ×2 IMPLANT
SHEARS HARMONIC 9CM CVD (BLADE) IMPLANT
SPONGE GAUZE 4X4 12PLY (GAUZE/BANDAGES/DRESSINGS) ×2 IMPLANT
SPONGE LAP 18X18 X RAY DECT (DISPOSABLE) ×2 IMPLANT
SPONGE SURGIFOAM ABS GEL 100 (HEMOSTASIS) ×2 IMPLANT
SUT CHROMIC 3 0 SH 27 (SUTURE) IMPLANT
SUT VIC AB 3-0 SH 27 (SUTURE)
SUT VIC AB 3-0 SH 27XBRD (SUTURE) IMPLANT
SYR BULB IRRIGATION 50ML (SYRINGE) ×2 IMPLANT
SYR CONTROL 10ML LL (SYRINGE) ×2 IMPLANT
TOWEL OR 17X26 10 PK STRL BLUE (TOWEL DISPOSABLE) ×2 IMPLANT
YANKAUER SUCT BULB TIP 10FT TU (MISCELLANEOUS) ×2 IMPLANT

## 2013-04-01 NOTE — Anesthesia Preprocedure Evaluation (Addendum)
Anesthesia Evaluation  Patient identified by MRN, date of birth, ID band Patient awake    Reviewed: Allergy & Precautions, H&P , NPO status , Patient's Chart, lab work & pertinent test results  Airway Mallampati: II TM Distance: >3 FB Neck ROM: Full    Dental  (+) Teeth Intact and Dental Advisory Given   Pulmonary neg pulmonary ROS, asthma ,  breath sounds clear to auscultation  Pulmonary exam normal       Cardiovascular hypertension, DVT (DVT of neck in past) negative cardio ROS  + dysrhythmias Supra Ventricular Tachycardia Rhythm:Regular Rate:Normal     Neuro/Psych negative neurological ROS  negative psych ROS   GI/Hepatic negative GI ROS, Neg liver ROS, PUD, GERD-  ,Crohns disease   Endo/Other  neg diabetesMorbid obesity  Renal/GU ARFRenal diseasenegative Renal ROS  negative genitourinary   Musculoskeletal negative musculoskeletal ROS (+)   Abdominal (+) + obese,   Peds negative pediatric ROS (+)  Hematology negative hematology ROS (+)   Anesthesia Other Findings   Reproductive/Obstetrics                         Anesthesia Physical Anesthesia Plan  ASA: III and emergent  Anesthesia Plan: General   Post-op Pain Management:    Induction: Intravenous  Airway Management Planned: Oral ETT  Additional Equipment:   Intra-op Plan:   Post-operative Plan: Extubation in OR  Informed Consent: I have reviewed the patients History and Physical, chart, labs and discussed the procedure including the risks, benefits and alternatives for the proposed anesthesia with the patient or authorized representative who has indicated his/her understanding and acceptance.   Dental advisory given  Plan Discussed with: CRNA  Anesthesia Plan Comments: (Risk of premature labor explained and all questions answered. I spoke with Dr. Crawford Givens and informed her of patients surgical intervention; she asked  that patient follow-up with OBGYN following procedure upon discharge from hospital. Dr. Barry Dienes aware and elects to proceed.    A Maysin Carstens MD)      Anesthesia Quick Evaluation

## 2013-04-01 NOTE — Anesthesia Postprocedure Evaluation (Signed)
Anesthesia Post Note  Patient: [name]  Procedure(s) Performed: Procedure(s) (LRB): EXAM UNDER ANESTHESIA (N/A) IRRIGATION AND DEBRIDEMENT PERIRECTAL ABSCESS (N/A)  Anesthesia type: General  Patient location: PACU  Post pain: Pain level controlled  Post assessment: Post-op Vital signs reviewed  Last Vitals:  Filed Vitals:   04/01/13 1020  BP:   Pulse: 97  Temp:   Resp: 21    Post vital signs: Reviewed  Level of consciousness: sedated  Complications: No apparent anesthesia complications

## 2013-04-01 NOTE — Transfer of Care (Signed)
Immediate Anesthesia Transfer of Care Note  Patient: [name]  Procedure(s) Performed: Procedure(s) (LRB): EXAM UNDER ANESTHESIA (N/A) IRRIGATION AND DEBRIDEMENT PERIRECTAL ABSCESS (N/A)  Patient Location: PACU  Anesthesia Type: General  Level of Consciousness: sedated, patient cooperative and responds to stimulation  Airway & Oxygen Therapy: Patient Spontanous Breathing and Patient connected to face mask oxgen  Post-op Assessment: Report given to PACU RN and Post -op Vital signs reviewed and stable  Post vital signs: Reviewed and stable  Complications: No apparent anesthesia complications

## 2013-04-01 NOTE — Interval H&P Note (Signed)
History and Physical Interval Note:  04/01/2013 9:02 AM  [name]  has presented today for surgery, with the diagnosis of peri anal abscess  The various methods of treatment have been discussed with the patient and family. After consideration of risks, benefits and other options for treatment, the patient has consented to  Procedure(s): EXAM UNDER ANESTHESIA (N/A) IRRIGATION AND DEBRIDEMENT PERIRECTAL ABSCESS (N/A) as a surgical intervention .  The patient's history has been reviewed, patient examined, no change in status, stable for surgery.  I have reviewed the patient's chart and labs.  Questions were answered to the patient's satisfaction.     Jarad Barth

## 2013-04-01 NOTE — Op Note (Signed)
Incision and Drainage left complex perirectal abscess   Pre-operative Diagnosis: left perirectal abscess, complicated  Post-operative Diagnosis: same   Indications: [redacted] week pregnant female with Crohn's disease, previous fistula in ano and abscesses, last drained 11/2011  Anesthesia: General   Procedure Details  The procedure, risks and complications have been discussed in detail (including, infection, bleeding, need for additional procedures) with the patient, and the patient has signed consent to the procedure. The patient was informed that the wound would be left open.  The patient was taken to OR 11 and general anesthesia was induced. The patient was placed into lithotomy position.  The skin was sterilely prepped and draped over the affected area in the usual fashion. Time out was performed according to the surgical safety checklist.   I&D with a #15 blade was performed on the left posterior perirectal region.  Cultures were sent.  Purulent drainage was present. The immediate soft tissue below the skin did not have abscess, but there were numerous deeper loculated pockets that were broken up digitally with copious purulent drainage. The wound was packed with 1/2" iodoform dressing.  The patient was awakened from anesthesia and taken to the PACU in stable condition.   Findings:  Complex left posterior perirectal abscess  EBL: minimal   Complications:  None known Counts:  correct  Drains: None   Condition: Tolerated procedure well   Complications:  none known.

## 2013-04-02 MED ORDER — OXYCODONE-ACETAMINOPHEN 5-325 MG PO TABS: 1.0000 | ORAL_TABLET | ORAL | Status: DC | PRN

## 2013-04-02 MED ORDER — AMOXICILLIN-POT CLAVULANATE 875-125 MG PO TABS: 1.0000 | ORAL_TABLET | Freq: Two times a day (BID) | ORAL | Status: DC

## 2013-04-02 NOTE — Progress Notes (Signed)
1 Day Post-Op  Subjective: Doing well.  No pain.  Packing came out last night  Objective: Vital signs in last 24 hours: Temp:  [97.6 F (36.4 C)-98.7 F (37.1 C)] 98.1 F (36.7 C) (09/20 0500) Pulse Rate:  [73-99] 78 (09/20 0500) Resp:  [16-21] 18 (09/20 0500) BP: (102-145)/(48-95) 112/74 mmHg (09/20 0500) SpO2:  [100 %] 100 % (09/20 0500) Last BM Date: 03/31/13  Intake/Output from previous day: 09/19 0701 - 09/20 0700 In: 3241.7 [P.O.:240; I.V.:3001.7] Out: 8335 [Urine:1825] Intake/Output this shift: Total I/O In: 240 [P.O.:240] Out: 250 [Urine:250]  General appearance: alert, cooperative and no distress Skin: no erythema, or induration or remaining fluctuance.  wound explored and no purulence.  widely open  Lab Results:   Recent Labs  03/31/13 1823  WBC 10.1  HGB 10.9*  HCT 31.8*  PLT 397   BMET  Recent Labs  03/31/13 1823  NA 135  K 3.4*  CL 102  CO2 23  GLUCOSE 84  BUN 6  CREATININE 0.56  CALCIUM 9.8   PT/INR No results found for this basename: LABPROT, INR,  in the last 72 hours ABG No results found for this basename: PHART, PCO2, PO2, HCO3,  in the last 72 hours  Studies/Results: No results found.  Anti-infectives: Anti-infectives   Start     Dose/Rate Route Frequency Ordered Stop   03/31/13 2200  piperacillin-tazobactam (ZOSYN) IVPB 3.375 g     3.375 g 12.5 mL/hr over 240 Minutes Intravenous 3 times per day 03/31/13 2103        Assessment/Plan: s/p Procedure(s): EXAM UNDER ANESTHESIA (N/A) IRRIGATION AND DEBRIDEMENT PERIRECTAL ABSCESS (N/A) the wound looks good.  she feels okay for discharge.  will change to oral abx and plan for discharge with follow up [facility]  LOS: 2 days    [place], [place] 04/02/2013

## 2013-04-04 ENCOUNTER — Encounter (HOSPITAL_COMMUNITY): Payer: Self-pay | Admitting: General Surgery

## 2013-04-04 LAB — CULTURE, ROUTINE-ABSCESS

## 2013-04-06 LAB — ANAEROBIC CULTURE

## 2013-04-20 ENCOUNTER — Encounter (INDEPENDENT_AMBULATORY_CARE_PROVIDER_SITE_OTHER): Payer: Self-pay | Admitting: General Surgery

## 2013-04-20 ENCOUNTER — Ambulatory Visit (INDEPENDENT_AMBULATORY_CARE_PROVIDER_SITE_OTHER): Payer: Medicaid Other | Admitting: General Surgery

## 2013-04-20 VITALS — BP 114/60 | HR 109 | Temp 97.4°F | Ht 64.0 in | Wt 227.8 lb

## 2013-04-20 DIAGNOSIS — K612 Anorectal abscess: Secondary | ICD-10-CM

## 2013-04-20 NOTE — Progress Notes (Signed)
HISTORY: Pt is a 22 yo F at [redacted] weeks gestation who is around 3 weeks s/p I&D of perianal abscess.  She has history of crohn's disease.  She has had perianal crohn's disease in the past, but this has been around a year and a half ago.  She is now feeling much better.  She has no pain and minimal drainage.  She denies fevers/ chills.  She states that this is the best her bottom has felt in a long time.     EXAM: General:  Alert and oriented.   Incision:  Healing well.  Still firm.  Cannot express any drainage.  No erythema or warmth.     PATHOLOGY: n/a   ASSESSMENT AND PLAN:   Abscess of anal and rectal regions There is still some firm scar tissue, but the abscess appears to be resolved.    Follow up as needed.    Abscess likely a result of perianal crohn's with fistula.   If she develops recurrent abscesses during pregnancy, she may benefit from draining seton.            Milus Height, MD Surgical Oncology, [facility]  Gwendolyn Grant, MD Rowe Clack, MD

## 2013-04-20 NOTE — Patient Instructions (Signed)
Call early if you develop recurrent symptoms.    Follow up as needed.

## 2013-04-20 NOTE — Assessment & Plan Note (Signed)
There is still some firm scar tissue, but the abscess appears to be resolved.    Follow up as needed.    Abscess likely a result of perianal crohn's with fistula.   If she develops recurrent abscesses during pregnancy, she may benefit from draining seton.

## 2013-04-21 ENCOUNTER — Encounter (HOSPITAL_COMMUNITY): Payer: Self-pay | Admitting: General Surgery

## 2013-04-21 DIAGNOSIS — K611 Rectal abscess: Secondary | ICD-10-CM

## 2013-04-21 DIAGNOSIS — Z3490 Encounter for supervision of normal pregnancy, unspecified, unspecified trimester: Secondary | ICD-10-CM

## 2013-04-21 HISTORY — DX: Rectal abscess: K61.1

## 2013-04-21 NOTE — Discharge Summary (Signed)
Physician Discharge Summary  Patient ID: ARRION BURRUEL MRN: [id number] DOB/AGE: [birth year] 22 y.o.  Admit date: 03/31/2013 Discharge date: 04/21/2013  Admission Diagnoses:  1.   Left perianal/rectal abscess  Recurrent. She probably has an underlying fistula in ano by her history.  2. Crohn's disease - of small bowel, colon, terminal ileum and rectum  Sees Dr. [name] - on 6MP and Humira  3. Pregnant - 19 weeks  Followed by Dr. Charlesetta Garibaldi. Saw Dr. Benjaman Lobe - 01/25/2013.  4. History of rectovaginal fistula  5. History of left IJ blood clot  6. History of partial SBO secondary to her prior abdominal surgery and Crohn's - 02/2012   Discharge Diagnoses:  left perirectal abscess, complicated  Principal Problem:   Perirectal abscess Active Problems:   Patient currently pregnant   PROCEDURES: Incision and Drainage left complex perirectal abscess, 04/01/13 Dr. Haydee Monica Course:  ABRISH ERNY is a 22 y.o. (DOB: 12/05/1990) AA female whose primary care physician is Gwendolyn Grant, MD and comes to the [facility] with increasing perianal pain.  Mom, Edrick Oh, in room.  Diagnosed with Crohn's at age 22. She had a bowel resection in Dec 2011 by Dr. Brantley Stage, he reversed her in Mar 2012. Since then she has had two prior I&D of perirectal disease - Dr. Donne Hazel 11/12/2011 and Dr. Harlow Asa 12/11/2011. Since her last I&D, she says that the wound has never really healed and she has had chronic drainage. She tried antibiotics this summer, but it made things worse.  I spoke to pharmacist, Lyndee Leo, about antibiotic coverage. I tried to call Dr. Berneta Sages service, but went into a voice mail black hole.  She was admitted by Dr. Lucia Gaskins and seen and taken to the OR the following day by Dr. Barry Dienes.  She was seen and discharged home the following day by Dr. Lilyan Punt.  I did not see the patient.  Condition on D/C:  Improved.    Disposition: 01-Home or Self Care      Discharge Orders   Future  Appointments Provider Department Dept Phone   05/19/2013 9:45 AM Lafayette Dragon, MD [facility] [phone]   Future Orders Complete By Expires   Call MD for:  persistant nausea and vomiting  As directed    Call MD for:  redness, tenderness, or signs of infection (pain, swelling, redness, odor or green/yellow discharge around incision site)  As directed    Call MD for:  severe uncontrolled pain  As directed    Call MD for:  temperature >100.4  As directed    Diet - low sodium heart healthy  As directed    Discharge instructions  As directed    Comments:     Call [phone] to schedule follow up with Dr. Barry Dienes or Dr. Marcello Moores in 1-2 weeks or sooner as needed. Shower or sitz baths TID and after each bowel movement. Follow up with your gastroenterologist for management of Crohns Stool softeners as needed   Increase activity slowly  As directed        Medication List    STOP taking these medications       acetaminophen 500 MG tablet  Commonly known as:  TYLENOL      TAKE these medications       adalimumab 40 MG/0.8ML injection  Commonly known as:  HUMIRA  Inject 0.8 mLs (40 mg total) into the skin every 14 (fourteen) days.     mercaptopurine 50 MG tablet  Commonly known as:  PURINETHOL  Take 100 mg by mouth daily.     multivitamin-prenatal 27-0.8 MG Tabs tablet  Take 1 tablet by mouth daily at 12 noon.     promethazine 25 MG tablet  Commonly known as:  PHENERGAN  Take 25 mg by mouth every 6 (six) hours as needed for nausea.         SignedEarnstine Regal 04/21/2013, 4:26 PM

## 2013-05-19 ENCOUNTER — Encounter: Payer: Self-pay | Admitting: Internal Medicine

## 2013-05-19 ENCOUNTER — Other Ambulatory Visit: Payer: Self-pay

## 2013-05-19 ENCOUNTER — Other Ambulatory Visit (INDEPENDENT_AMBULATORY_CARE_PROVIDER_SITE_OTHER): Payer: Medicaid Other

## 2013-05-19 ENCOUNTER — Ambulatory Visit (INDEPENDENT_AMBULATORY_CARE_PROVIDER_SITE_OTHER): Payer: Medicaid Other | Admitting: Internal Medicine

## 2013-05-19 VITALS — BP 110/80 | HR 99 | Ht 64.0 in | Wt 228.0 lb

## 2013-05-19 DIAGNOSIS — Z331 Pregnant state, incidental: Secondary | ICD-10-CM

## 2013-05-19 DIAGNOSIS — K5090 Crohn's disease, unspecified, without complications: Secondary | ICD-10-CM

## 2013-05-19 DIAGNOSIS — D849 Immunodeficiency, unspecified: Secondary | ICD-10-CM

## 2013-05-19 LAB — CBC WITH DIFFERENTIAL/PLATELET
Basophils Relative: 0.3 % (ref 0.0–3.0)
Eosinophils Relative: 1.1 % (ref 0.0–5.0)
MCV: 83.5 fl (ref 78.0–100.0)
Monocytes Absolute: 0.6 10*3/uL (ref 0.1–1.0)
Monocytes Relative: 7 % (ref 3.0–12.0)
Neutrophils Relative %: 73.7 % (ref 43.0–77.0)
Platelets: 403 10*3/uL — ABNORMAL HIGH (ref 150.0–400.0)
RBC: 3.61 Mil/uL — ABNORMAL LOW (ref 3.87–5.11)
WBC: 9.2 10*3/uL (ref 4.5–10.5)

## 2013-05-19 LAB — VITAMIN B12: Vitamin B-12: 222 pg/mL (ref 211–911)

## 2013-05-19 LAB — IBC PANEL
Iron: 29 ug/dL — ABNORMAL LOW (ref 42–145)
Saturation Ratios: 4.6 % — ABNORMAL LOW (ref 20.0–50.0)
Transferrin: 453.7 mg/dL — ABNORMAL HIGH (ref 212.0–360.0)

## 2013-05-19 LAB — SEDIMENTATION RATE: Sed Rate: 86 mm/hr — ABNORMAL HIGH (ref 0–22)

## 2013-05-19 LAB — TSH: TSH: 2.12 u[IU]/mL (ref 0.35–5.50)

## 2013-05-19 NOTE — Progress Notes (Signed)
[name] 1990-09-01 MRN [id number]  History of Present Illness:  This is a 22 year old African American female who is in her 26th week of intrauterine pregnancy. She has complicated Crohn's disease involving the rectum. She is status post recent drainage of a rectovaginal fistula. She has involvement of the terminal ileum which was resected 2 years ago. She was initially diagnosed with duodenal Crohn's disease in 2004. She is doing excellent during her third trimester of pregnancy. She denies diarrhea, abdominal pain or nausea. The baby will be delivered by C-section on 08/16/2013 by Dr.Rivard. She is continue on Humira 40 mg every 2 weeks and on 6 MP 50 mg daily. Her last colonoscopy in September 2013 showed an anastomotic ulcer at the ileocolic anastomosis. Her endoscopy in April 2013 was normal. She has a history of iron dextran allergy, DVT and C. difficile colitis.    Past Medical History  Diagnosis Date  . 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 Anxiety   . Candida esophagitis   . Ovarian cyst, left   . Anemia     iron def  . DVT (deep venous thrombosis) 08/2010    Left IJ (postop) anticoag x 3 mo  . SVT (supraventricular tachycardia)   . Depression   . Small bowel obstruction   . Acute renal failure   . MRSA (methicillin resistant Staphylococcus aureus)     multiple abscesses  . Allergy   . Blood transfusion   . Hypertension   . Perianal fistula   . 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 Gastroparesis   . Blood transfusion without reported diagnosis   . Perirectal abscess 04/21/2013   Past Surgical History  Procedure Laterality Date  . Cholecystectomy  2009  . Closure of ileostomy/loop  08/2010  . Colectomy with diverting loop ileostomy    . Appendectomy    . Esophagogastroduodenoscopy  11/11/2011    Procedure: ESOPHAGOGASTRODUODENOSCOPY (EGD);  Surgeon: Lafayette Dragon, MD;  Location: [facility];   Service: Endoscopy;  Laterality: N/A;  . Examination under anesthesia  11/12/2011    Procedure: EXAM UNDER ANESTHESIA;  Surgeon: Rolm Bookbinder, MD;  Location: [facility];  Service: General;  Laterality: N/A;  . Proctoscopy  11/12/2011    Procedure: PROCTOSCOPY;  Surgeon: Rolm Bookbinder, MD;  Location: [facility];  Service: General;  Laterality: N/A;  . Incision and drainage perirectal abscess  11/12/2011    Procedure: IRRIGATION AND DEBRIDEMENT PERIRECTAL ABSCESS;  Surgeon: Rolm Bookbinder, MD;  Location: [facility];  Service: General;  Laterality: N/A;  . Incision and drainage perirectal abscess  12/11/2011    Procedure: IRRIGATION AND DEBRIDEMENT PERIRECTAL ABSCESS;  Surgeon: [name], MD;  Location: [facility];  Service: General;  Laterality: N/A;  . Examination under anesthesia N/A 04/01/2013    Procedure: Jasmine December UNDER ANESTHESIA;  Surgeon: Stark Klein, MD;  Location: [facility];  Service: General;  Laterality: N/A;  . Incision and drainage perirectal abscess N/A 04/01/2013    Procedure: IRRIGATION AND DEBRIDEMENT PERIRECTAL ABSCESS;  Surgeon: Stark Klein, MD;  Location: [facility];  Service: General;  Laterality: N/A;    reports that she has never smoked. She has never used smokeless tobacco. She reports that she does not drink alcohol or use illicit drugs. family history includes Diabetes in her cousin and maternal uncle; Emphysema in her maternal grandmother; Heart attack in her maternal grandmother; Heart disease in her maternal grandmother; Sickle cell trait  in her father. There is no history of Colon cancer. Allergies  Allergen Reactions  . Iron Dextran Anaphylaxis  . Iron     REACTION: IV iron dextran        Review of Systems:Negative for abdominal pain diarrhea or bleeding  The remainder of the 10 point ROS is negative except as outlined in H&P   Physical Exam: General appearance  Well developed, in no distress.[redacted] weeks pregnant  Eyes- non icteric. HEENT nontraumatic, normocephalic. Mouth  no lesions, tongue papillated, no cheilosis. Neck supple without adenopathy, thyroid not enlarged, no carotid bruits, no JVD. Lungs Clear to auscultation bilaterally. Cor normal S1, normal S2, regular rhythm, no murmur,  quiet precordium. Abdomen: Protuberant. Consistent with third trimester pregnancy. Normal active bowel sounds. No tenderness. Rectal:Not done.  Extremities no pedal edema. Skin no lesions. Neurological alert and oriented x 3. Psychological normal mood and affect.  Assessment and Plan:  Problem #28 22 year old [place] American female who is in the third trimester of pregnancy doing very well with her Crohn's disease. Her rectal vaginal fistula continues to drain small amounts. She will deliver by C-section in the next 3 months. We will continue current medication as such. I will see her one week before her delivery at the end of January 2015. We will obtain labs today.   05/19/2013 [name]

## 2013-05-19 NOTE — Patient Instructions (Addendum)
Your physician has requested that you go to the basement for the following lab work before leaving today: CBC, Sed Rate, IBC, TSH, B12  You have been scheduled for an appointment with Dr [name] on 08/09/12 @ 9:45 am.  CC: Dr Rowan Blase, Dr Cletis Media, Dr Ola Spurr

## 2013-05-24 ENCOUNTER — Ambulatory Visit: Payer: Medicaid Other | Admitting: Internal Medicine

## 2013-05-24 ENCOUNTER — Other Ambulatory Visit: Payer: Self-pay | Admitting: *Deleted

## 2013-05-26 ENCOUNTER — Telehealth: Payer: Self-pay | Admitting: Internal Medicine

## 2013-05-26 NOTE — Telephone Encounter (Signed)
Patient states her OB is suppose to call us with permission for her to get B12 injections. Informed patient I have not received a call. She will contact OB/GYN office.

## 2013-05-30 ENCOUNTER — Telehealth: Payer: Self-pay | Admitting: Internal Medicine

## 2013-05-30 NOTE — Telephone Encounter (Signed)
Please start Augmentin 875 mg po qd #10,

## 2013-05-30 NOTE — Telephone Encounter (Signed)
Spoke with patient and she states the abscess is starting to drain again. States it started today. She states Dr. [name] told her to call her first for possible antibiotics. Please, advise.

## 2013-05-31 MED ORDER — AMOXICILLIN-POT CLAVULANATE 875-125 MG PO TABS: ORAL_TABLET | ORAL | Status: DC

## 2013-05-31 MED ORDER — CYANOCOBALAMIN 1000 MCG/ML IJ SOLN: INTRAMUSCULAR | Status: DC

## 2013-05-31 NOTE — Telephone Encounter (Signed)
Left a message for patient to call me. Per Dixon Boos, CMA patient's OB sent a letter stating she can take B 12 injections.

## 2013-05-31 NOTE — Telephone Encounter (Signed)
Spoke with patient and gave her Dr. Nichola Sizer recommendation. Rx sent.

## 2013-06-02 ENCOUNTER — Ambulatory Visit (INDEPENDENT_AMBULATORY_CARE_PROVIDER_SITE_OTHER): Payer: 59 | Admitting: Internal Medicine

## 2013-06-02 DIAGNOSIS — E538 Deficiency of other specified B group vitamins: Secondary | ICD-10-CM

## 2013-06-02 MED ORDER — CYANOCOBALAMIN 1000 MCG/ML IJ SOLN
1000.0000 ug | INTRAMUSCULAR | Status: DC
  Administered 2013-06-02 – 2013-07-04 (×2): 1000 ug via INTRAMUSCULAR

## 2013-07-04 ENCOUNTER — Ambulatory Visit (INDEPENDENT_AMBULATORY_CARE_PROVIDER_SITE_OTHER): Payer: Medicaid Other | Admitting: Internal Medicine

## 2013-07-04 DIAGNOSIS — E538 Deficiency of other specified B group vitamins: Secondary | ICD-10-CM | POA: Insufficient documentation

## 2013-08-04 ENCOUNTER — Encounter (HOSPITAL_COMMUNITY): Payer: Self-pay | Admitting: Pharmacist

## 2013-08-05 ENCOUNTER — Other Ambulatory Visit: Payer: Self-pay | Admitting: Obstetrics and Gynecology

## 2013-08-09 ENCOUNTER — Encounter: Payer: Self-pay | Admitting: Internal Medicine

## 2013-08-09 ENCOUNTER — Ambulatory Visit (INDEPENDENT_AMBULATORY_CARE_PROVIDER_SITE_OTHER): Payer: Medicaid Other | Admitting: Internal Medicine

## 2013-08-09 VITALS — BP 130/84 | HR 88 | Ht 64.0 in | Wt 226.4 lb

## 2013-08-09 DIAGNOSIS — K5010 Crohn's disease of large intestine without complications: Secondary | ICD-10-CM

## 2013-08-09 DIAGNOSIS — D849 Immunodeficiency, unspecified: Secondary | ICD-10-CM

## 2013-08-09 DIAGNOSIS — K603 Anal fistula, unspecified: Secondary | ICD-10-CM

## 2013-08-09 DIAGNOSIS — Z331 Pregnant state, incidental: Secondary | ICD-10-CM

## 2013-08-09 DIAGNOSIS — Z3490 Encounter for supervision of normal pregnancy, unspecified, unspecified trimester: Secondary | ICD-10-CM

## 2013-08-09 MED ORDER — CYANOCOBALAMIN 1000 MCG/ML IJ SOLN
1000.0000 ug | INTRAMUSCULAR | Status: AC
  Administered 2013-08-09 – 2013-10-04 (×2): 1000 ug via INTRAMUSCULAR

## 2013-08-09 NOTE — Patient Instructions (Addendum)
We have given you a vitamin b12 shot today. You will be due for your next injection in 1 month.  Follow up with Dr [name] in 8 weeks.  Cc:Dr Rivard/Dr Charlesetta Garibaldi, Dr Asa Lente

## 2013-08-09 NOTE — Progress Notes (Signed)
[name] 04-23-1991 [id number]  Note: This dictation was prepared with [name] digital system. Any transcriptional errors that result from this procedure are unintentional.   History of Present Illness:  This is a 23 year old [place] American female who is in  third trimester of pregnancy. The baby is to be delivered by C-section on 08/16/2013 by Dr. Cletis Media Dr [name]. She is feeling well. She has had  complicated Crohn's disease since 2004 involving the rectum, colon, terminal ileum and duodenum. She has an ongoing rectovaginal fistula. She had a terminal ileal resection 2 years ago. Her pregnancy has been uneventful from a GI standpoint. She is maintained on Humira and 6 MP 50 mg daily. Her last colonoscopy in 2013 showed an anastomotic ulcer at the ileocolic anastomosis. Her last upper endoscopy in April 2013 was normal. She is allergic to iron dextran. She at one point had C. difficile colitis. She is denying any diarrhea, abdominal pain, nausea or vomiting at this time.     Past Medical History  Diagnosis Date  . 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 Anxiety   . Candida esophagitis   . Ovarian cyst, left   . Anemia     iron def  . DVT (deep venous thrombosis) 08/2010    Left IJ (postop) anticoag x 3 mo  . SVT (supraventricular tachycardia)   . Depression   . Small bowel obstruction   . Acute renal failure   . MRSA (methicillin resistant Staphylococcus aureus)     multiple abscesses  . Allergy   . Blood transfusion   . Hypertension   . Perianal fistula   . 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 Gastroparesis   . Blood transfusion without reported diagnosis   . Perirectal abscess 04/21/2013    Past Surgical History  Procedure Laterality Date  . Cholecystectomy  2009  . Closure of ileostomy/loop  08/2010  . Colectomy with diverting loop ileostomy    . Appendectomy    . Esophagogastroduodenoscopy   11/11/2011    Procedure: ESOPHAGOGASTRODUODENOSCOPY (EGD);  Surgeon: Anita Dragon, MD;  [name];  Service: Endoscopy;  Laterality: N/A;  . Examination under anesthesia  11/12/2011    Procedure: EXAM UNDER ANESTHESIA;  Surgeon: Anita Bookbinder, MD;  Location: [facility];  Service: General;  Laterality: N/A;  . Proctoscopy  11/12/2011    Procedure: PROCTOSCOPY;  Surgeon: Anita Bookbinder, MD;  Location: [facility];  Service: General;  Laterality: N/A;  . Incision and drainage perirectal abscess  11/12/2011    Procedure: IRRIGATION AND DEBRIDEMENT PERIRECTAL ABSCESS;  Surgeon: Anita Bookbinder, MD;  Location: [facility];  Service: General;  Laterality: N/A;  . Incision and drainage perirectal abscess  12/11/2011    Procedure: IRRIGATION AND DEBRIDEMENT PERIRECTAL ABSCESS;  Surgeon: Anita Regal, MD;  Location: [facility];  Service: General;  Laterality: N/A;  . Examination under anesthesia N/A 04/01/2013    Procedure: [name] UNDER ANESTHESIA;  Surgeon: Anita Klein, MD;  Location: [facility];  Service: General;  Laterality: N/A;  . Incision and drainage perirectal abscess N/A 04/01/2013    Procedure: IRRIGATION AND DEBRIDEMENT PERIRECTAL ABSCESS;  Surgeon: Anita Klein, MD;  Location: [facility];  Service: General;  Laterality: N/A;    Allergies  Allergen Reactions  . Iron Dextran Anaphylaxis    Family history and social history have been reviewed.  Review of Systems: Weight gain of 7 pounds  The remainder of  the 10 point ROS is negative except as outlined in the H&P  Physical Exam: General Appearance Well developed, in no distress Eyes  Non icteric  HEENT  Non traumatic, normocephalic  Mouth No lesion, tongue papillated, no cheilosis Neck Supple without adenopathy, thyroid not enlarged, no carotid bruits, no JVD Lungs Clear to auscultation bilaterally COR Normal S1, normal S2, regular rhythm, no murmur, quiet precordium Abdomen protuberant consistent with third trimester pregnancy. Normal active  bowel sounds. No tenderness Rectal not done Extremities  No pedal edema Skin No lesions Neurological Alert and oriented x 3 Psychological Normal mood and affect  Assessment and Plan:   Problem #1 Intrauterine pregnancy, 3rd trimester. Baby is to be delivered by C-section on 08/16/2013.  Problem #2 Crohn's disease of the duodenum, small bowel and rectum, currently in remission except for a rectovaginal fistula. Patient has immunosuppression with Humira and 6-MP. She will continue her medications throughout the delivery. She will also continue oral iron supplements. I will see her in the office in 8 weeks after her delivery.    [name] 08/09/2013

## 2013-08-11 ENCOUNTER — Encounter (HOSPITAL_COMMUNITY): Payer: Self-pay

## 2013-08-15 ENCOUNTER — Encounter (HOSPITAL_COMMUNITY): Payer: Self-pay

## 2013-08-15 ENCOUNTER — Encounter (HOSPITAL_COMMUNITY)
Admission: RE | Admit: 2013-08-15 | Discharge: 2013-08-15 | Disposition: A | Discharge status: Home / Self Care | Payer: Medicaid Other | Source: Ambulatory Visit | Attending: Obstetrics and Gynecology | Admitting: Obstetrics and Gynecology

## 2013-08-15 HISTORY — DX: Other specified postprocedural states: Z98.890

## 2013-08-15 HISTORY — DX: Nausea with vomiting, unspecified: R11.2

## 2013-08-15 HISTORY — DX: Personal history of other medical treatment: Z92.89

## 2013-08-15 LAB — CBC
HCT: 33.9 % — ABNORMAL LOW (ref 36.0–46.0)
Hemoglobin: 11.1 g/dL — ABNORMAL LOW (ref 12.0–15.0)
MCH: 26.3 pg (ref 26.0–34.0)
MCHC: 32.7 g/dL (ref 30.0–36.0)
MCV: 80.3 fL (ref 78.0–100.0)
PLATELETS: 322 10*3/uL (ref 150–400)
RBC: 4.22 MIL/uL (ref 3.87–5.11)
RDW: 18.4 % — ABNORMAL HIGH (ref 11.5–15.5)
WBC: 7.3 10*3/uL (ref 4.0–10.5)

## 2013-08-15 LAB — TYPE AND SCREEN
ABO/RH(D): AB POS
Antibody Screen: NEGATIVE

## 2013-08-15 LAB — SURGICAL PCR SCREEN
MRSA, PCR: NEGATIVE
Staphylococcus aureus: POSITIVE — AB

## 2013-08-15 LAB — RPR: RPR Ser Ql: NONREACTIVE

## 2013-08-15 LAB — ABO/RH: ABO/RH(D): AB POS

## 2013-08-15 MED ORDER — CEFAZOLIN SODIUM-DEXTROSE 2-3 GM-% IV SOLR
2.0000 g | INTRAVENOUS | Status: AC
  Administered 2013-08-16: 2 g via INTRAVENOUS

## 2013-08-15 NOTE — Patient Instructions (Addendum)
   Your procedure is scheduled on:  Tuesday, Feb 3  Enter through the [employer] [facility] at: [facility] up the phone at the desk and dial [phone] and inform us of your arrival.  Please call this number if you have any problems the morning of surgery: [phone]  Remember: Do not eat food after midnight: Monday Do not drink clear liquids after: 930 AM Tuesday Take these medicines the morning of surgery with a SIP OF WATER:  mercaptopurine  Do not wear jewelry, make-up, or FINGER nail polish No metal in your hair or on your body. Do not wear lotions, powders, perfumes.  You may wear deodorant.  Do not bring valuables to the hospital. Contacts, dentures or bridgework may not be worn into surgery.  Leave suitcase in the car. After Surgery it may be brought to your room. For patients being admitted to the hospital, checkout time is 11:00am the day of discharge.  Home with Boyfriend Katheran Awe.

## 2013-08-16 ENCOUNTER — Encounter (HOSPITAL_COMMUNITY): Payer: Self-pay | Admitting: *Deleted

## 2013-08-16 ENCOUNTER — Inpatient Hospital Stay (HOSPITAL_COMMUNITY): Payer: Medicaid Other | Admitting: Anesthesiology

## 2013-08-16 ENCOUNTER — Encounter (HOSPITAL_COMMUNITY)
Admission: AD | Disposition: A | Discharge status: Home / Self Care | Payer: Self-pay | Source: Ambulatory Visit | Attending: Obstetrics and Gynecology

## 2013-08-16 ENCOUNTER — Inpatient Hospital Stay (HOSPITAL_COMMUNITY)
Admission: AD | Admit: 2013-08-16 | Discharge: 2013-08-18 | DRG: 765 | Disposition: A | Discharge status: Home / Self Care | Payer: Medicaid Other | Source: Ambulatory Visit | Attending: Obstetrics and Gynecology | Admitting: Obstetrics and Gynecology

## 2013-08-16 ENCOUNTER — Encounter (HOSPITAL_COMMUNITY): Payer: Medicaid Other | Admitting: Anesthesiology

## 2013-08-16 DIAGNOSIS — K5090 Crohn's disease, unspecified, without complications: Secondary | ICD-10-CM | POA: Diagnosis present

## 2013-08-16 DIAGNOSIS — K603 Anal fistula: Secondary | ICD-10-CM

## 2013-08-16 DIAGNOSIS — O9902 Anemia complicating childbirth: Secondary | ICD-10-CM | POA: Diagnosis present

## 2013-08-16 DIAGNOSIS — D649 Anemia, unspecified: Secondary | ICD-10-CM | POA: Diagnosis present

## 2013-08-16 DIAGNOSIS — Z86718 Personal history of other venous thrombosis and embolism: Secondary | ICD-10-CM

## 2013-08-16 DIAGNOSIS — O99892 Other specified diseases and conditions complicating childbirth: Principal | ICD-10-CM | POA: Diagnosis present

## 2013-08-16 DIAGNOSIS — K219 Gastro-esophageal reflux disease without esophagitis: Secondary | ICD-10-CM | POA: Diagnosis present

## 2013-08-16 DIAGNOSIS — K5010 Crohn's disease of large intestine without complications: Secondary | ICD-10-CM

## 2013-08-16 DIAGNOSIS — O99214 Obesity complicating childbirth: Secondary | ICD-10-CM

## 2013-08-16 DIAGNOSIS — K279 Peptic ulcer, site unspecified, unspecified as acute or chronic, without hemorrhage or perforation: Secondary | ICD-10-CM | POA: Diagnosis present

## 2013-08-16 DIAGNOSIS — E669 Obesity, unspecified: Secondary | ICD-10-CM | POA: Diagnosis present

## 2013-08-16 DIAGNOSIS — O9989 Other specified diseases and conditions complicating pregnancy, childbirth and the puerperium: Principal | ICD-10-CM

## 2013-08-16 DIAGNOSIS — Z331 Pregnant state, incidental: Secondary | ICD-10-CM

## 2013-08-16 DIAGNOSIS — Z3490 Encounter for supervision of normal pregnancy, unspecified, unspecified trimester: Secondary | ICD-10-CM

## 2013-08-16 DIAGNOSIS — D849 Immunodeficiency, unspecified: Secondary | ICD-10-CM

## 2013-08-16 SURGERY — Surgical Case
Anesthesia: Epidural | Site: Abdomen

## 2013-08-16 MED ORDER — CLINDAMYCIN HCL 300 MG PO CAPS
300.0000 mg | ORAL_CAPSULE | Freq: Three times a day (TID) | ORAL | Status: DC
  Administered 2013-08-17 – 2013-08-18 (×5): 300 mg via ORAL
  Filled 2013-08-16 (×8): qty 1

## 2013-08-16 MED ORDER — KETOROLAC TROMETHAMINE 30 MG/ML IJ SOLN
30.0000 mg | Freq: Four times a day (QID) | INTRAMUSCULAR | Status: AC | PRN
Start: 2013-08-16 — End: 2013-08-17
  Administered 2013-08-16: 30 mg via INTRAMUSCULAR

## 2013-08-16 MED ORDER — ZOLPIDEM TARTRATE 5 MG PO TABS: 5.0000 mg | ORAL_TABLET | Freq: Every evening | ORAL | Status: DC | PRN

## 2013-08-16 MED ORDER — OXYTOCIN 10 UNIT/ML IJ SOLN
INTRAMUSCULAR | Status: AC
  Filled 2013-08-16: qty 4

## 2013-08-16 MED ORDER — TETANUS-DIPHTH-ACELL PERTUSSIS 5-2.5-18.5 LF-MCG/0.5 IM SUSP: 0.5000 mL | Freq: Once | INTRAMUSCULAR | Status: DC

## 2013-08-16 MED ORDER — PHENYLEPHRINE 40 MCG/ML (10ML) SYRINGE FOR IV PUSH (FOR BLOOD PRESSURE SUPPORT)
PREFILLED_SYRINGE | INTRAVENOUS | Status: AC
  Filled 2013-08-16: qty 10

## 2013-08-16 MED ORDER — SENNOSIDES-DOCUSATE SODIUM 8.6-50 MG PO TABS
2.0000 | ORAL_TABLET | ORAL | Status: DC
Start: 2013-08-17 — End: 2013-08-18
  Administered 2013-08-16 – 2013-08-18 (×2): 2 via ORAL
  Filled 2013-08-16 (×2): qty 2

## 2013-08-16 MED ORDER — MEPERIDINE HCL 25 MG/ML IJ SOLN
6.2500 mg | INTRAMUSCULAR | Status: DC | PRN
  Administered 2013-08-16: 12.5 mg via INTRAVENOUS

## 2013-08-16 MED ORDER — SCOPOLAMINE 1 MG/3DAYS TD PT72: 1.0000 | MEDICATED_PATCH | Freq: Once | TRANSDERMAL | Status: DC

## 2013-08-16 MED ORDER — SODIUM CHLORIDE 0.9 % IJ SOLN
3.0000 mL | INTRAMUSCULAR | Status: DC | PRN
Start: 2013-08-16 — End: 2013-08-18

## 2013-08-16 MED ORDER — MIDAZOLAM HCL 2 MG/2ML IJ SOLN: 0.5000 mg | Freq: Once | INTRAMUSCULAR | Status: DC | PRN

## 2013-08-16 MED ORDER — NALOXONE HCL 1 MG/ML IJ SOLN: 1.0000 ug/kg/h | INTRAVENOUS | Status: DC | PRN

## 2013-08-16 MED ORDER — IBUPROFEN 600 MG PO TABS: 600.0000 mg | ORAL_TABLET | Freq: Four times a day (QID) | ORAL | Status: DC | PRN

## 2013-08-16 MED ORDER — WITCH HAZEL-GLYCERIN EX PADS: 1.0000 "application " | MEDICATED_PAD | CUTANEOUS | Status: DC | PRN

## 2013-08-16 MED ORDER — DIPHENHYDRAMINE HCL 50 MG/ML IJ SOLN: 12.5000 mg | INTRAMUSCULAR | Status: DC | PRN

## 2013-08-16 MED ORDER — LACTATED RINGERS IV SOLN
INTRAVENOUS | Status: DC
Start: 2013-08-16 — End: 2013-08-18
  Administered 2013-08-17 (×2): via INTRAVENOUS

## 2013-08-16 MED ORDER — SIMETHICONE 80 MG PO CHEW
80.0000 mg | CHEWABLE_TABLET | ORAL | Status: DC
  Administered 2013-08-16 – 2013-08-18 (×2): 80 mg via ORAL
  Filled 2013-08-16 (×2): qty 1

## 2013-08-16 MED ORDER — CYANOCOBALAMIN 1000 MCG/ML IJ SOLN: 1000.0000 ug | INTRAMUSCULAR | Status: DC

## 2013-08-16 MED ORDER — MORPHINE SULFATE 0.5 MG/ML IJ SOLN
INTRAMUSCULAR | Status: AC
  Filled 2013-08-16: qty 10

## 2013-08-16 MED ORDER — SCOPOLAMINE 1 MG/3DAYS TD PT72
1.0000 | MEDICATED_PATCH | Freq: Once | TRANSDERMAL | Status: DC
  Administered 2013-08-16: 1.5 mg via TRANSDERMAL

## 2013-08-16 MED ORDER — OXYTOCIN 10 UNIT/ML IJ SOLN
40.0000 [IU] | INTRAVENOUS | Status: DC | PRN
  Administered 2013-08-16: 40 [IU] via INTRAVENOUS

## 2013-08-16 MED ORDER — ONDANSETRON HCL 4 MG PO TABS: 4.0000 mg | ORAL_TABLET | ORAL | Status: DC | PRN

## 2013-08-16 MED ORDER — PHENYLEPHRINE HCL 10 MG/ML IJ SOLN
INTRAMUSCULAR | Status: DC | PRN
  Administered 2013-08-16 (×9): 80 ug via INTRAVENOUS

## 2013-08-16 MED ORDER — MEPERIDINE HCL 25 MG/ML IJ SOLN
INTRAMUSCULAR | Status: AC
  Filled 2013-08-16: qty 1

## 2013-08-16 MED ORDER — ONDANSETRON HCL 4 MG/2ML IJ SOLN
INTRAMUSCULAR | Status: AC
  Filled 2013-08-16: qty 4

## 2013-08-16 MED ORDER — SODIUM BICARBONATE 8.4 % IV SOLN
INTRAVENOUS | Status: DC | PRN
  Administered 2013-08-16: 5 mL via EPIDURAL
  Administered 2013-08-16: 3 mL via EPIDURAL
  Administered 2013-08-16 (×3): 5 mL via EPIDURAL

## 2013-08-16 MED ORDER — OXYTOCIN 40 UNITS IN LACTATED RINGERS INFUSION - SIMPLE MED: 62.5000 mL/h | INTRAVENOUS | Status: AC

## 2013-08-16 MED ORDER — SIMETHICONE 80 MG PO CHEW
80.0000 mg | CHEWABLE_TABLET | Freq: Three times a day (TID) | ORAL | Status: DC
  Administered 2013-08-17 – 2013-08-18 (×5): 80 mg via ORAL
  Filled 2013-08-16 (×5): qty 1

## 2013-08-16 MED ORDER — PROMETHAZINE HCL 25 MG/ML IJ SOLN
25.0000 mg | Freq: Four times a day (QID) | INTRAMUSCULAR | Status: DC | PRN
  Administered 2013-08-16: 25 mg via INTRAVENOUS
  Filled 2013-08-16: qty 1

## 2013-08-16 MED ORDER — MORPHINE SULFATE (PF) 0.5 MG/ML IJ SOLN
INTRAMUSCULAR | Status: DC | PRN
  Administered 2013-08-16: 4 mg via EPIDURAL

## 2013-08-16 MED ORDER — ACETAMINOPHEN 500 MG PO TABS
1000.0000 mg | ORAL_TABLET | Freq: Four times a day (QID) | ORAL | Status: AC
  Administered 2013-08-16 – 2013-08-17 (×3): 1000 mg via ORAL
  Filled 2013-08-16 (×3): qty 2

## 2013-08-16 MED ORDER — SODIUM BICARBONATE 8.4 % IV SOLN
INTRAVENOUS | Status: AC
  Filled 2013-08-16: qty 50

## 2013-08-16 MED ORDER — FENTANYL CITRATE 0.05 MG/ML IJ SOLN
25.0000 ug | INTRAMUSCULAR | Status: DC | PRN
  Administered 2013-08-16 (×2): 50 ug via INTRAVENOUS

## 2013-08-16 MED ORDER — PROMETHAZINE HCL 25 MG/ML IJ SOLN: 6.2500 mg | INTRAMUSCULAR | Status: DC | PRN

## 2013-08-16 MED ORDER — NALBUPHINE HCL 10 MG/ML IJ SOLN: 5.0000 mg | INTRAMUSCULAR | Status: DC | PRN

## 2013-08-16 MED ORDER — METOCLOPRAMIDE HCL 5 MG/ML IJ SOLN
10.0000 mg | Freq: Three times a day (TID) | INTRAMUSCULAR | Status: DC | PRN
Start: 2013-08-16 — End: 2013-08-17
  Administered 2013-08-16: 10 mg via INTRAVENOUS
  Filled 2013-08-16: qty 2

## 2013-08-16 MED ORDER — MENTHOL 3 MG MT LOZG
1.0000 | LOZENGE | OROMUCOSAL | Status: DC | PRN
Start: 2013-08-16 — End: 2013-08-18

## 2013-08-16 MED ORDER — METOCLOPRAMIDE HCL 5 MG/ML IJ SOLN
10.0000 mg | Freq: Four times a day (QID) | INTRAMUSCULAR | Status: DC
  Administered 2013-08-16 – 2013-08-17 (×4): 10 mg via INTRAVENOUS
  Filled 2013-08-16 (×4): qty 2

## 2013-08-16 MED ORDER — FENTANYL CITRATE 0.05 MG/ML IJ SOLN
INTRAMUSCULAR | Status: AC
  Administered 2013-08-16: 50 ug via INTRAVENOUS
  Filled 2013-08-16: qty 2

## 2013-08-16 MED ORDER — DIPHENHYDRAMINE HCL 25 MG PO CAPS
25.0000 mg | ORAL_CAPSULE | ORAL | Status: DC | PRN
  Administered 2013-08-17: 25 mg via ORAL
  Filled 2013-08-16: qty 1

## 2013-08-16 MED ORDER — KETOROLAC TROMETHAMINE 30 MG/ML IJ SOLN: 30.0000 mg | Freq: Four times a day (QID) | INTRAMUSCULAR | Status: AC | PRN

## 2013-08-16 MED ORDER — LANOLIN HYDROUS EX OINT: 1.0000 "application " | TOPICAL_OINTMENT | CUTANEOUS | Status: DC | PRN

## 2013-08-16 MED ORDER — MEASLES, MUMPS & RUBELLA VAC ~~LOC~~ INJ
0.5000 mL | INJECTION | Freq: Once | SUBCUTANEOUS | Status: DC
  Filled 2013-08-16: qty 0.5

## 2013-08-16 MED ORDER — ONDANSETRON HCL 4 MG/2ML IJ SOLN: 4.0000 mg | Freq: Three times a day (TID) | INTRAMUSCULAR | Status: DC | PRN

## 2013-08-16 MED ORDER — 0.9 % SODIUM CHLORIDE (POUR BTL) OPTIME
TOPICAL | Status: DC | PRN
  Administered 2013-08-16: 1000 mL

## 2013-08-16 MED ORDER — DIPHENHYDRAMINE HCL 50 MG/ML IJ SOLN: 25.0000 mg | INTRAMUSCULAR | Status: DC | PRN

## 2013-08-16 MED ORDER — DIBUCAINE 1 % RE OINT: 1.0000 "application " | TOPICAL_OINTMENT | RECTAL | Status: DC | PRN

## 2013-08-16 MED ORDER — OXYCODONE-ACETAMINOPHEN 5-325 MG PO TABS
1.0000 | ORAL_TABLET | ORAL | Status: DC | PRN
  Administered 2013-08-17 (×2): 1 via ORAL
  Administered 2013-08-18 (×3): 2 via ORAL
  Filled 2013-08-16 (×3): qty 2
  Filled 2013-08-16 (×2): qty 1

## 2013-08-16 MED ORDER — MERCAPTOPURINE 50 MG PO TABS
100.0000 mg | ORAL_TABLET | Freq: Every day | ORAL | Status: DC
  Administered 2013-08-17 – 2013-08-18 (×2): 100 mg via ORAL
  Filled 2013-08-16 (×3): qty 2

## 2013-08-16 MED ORDER — DIPHENHYDRAMINE HCL 25 MG PO CAPS: 25.0000 mg | ORAL_CAPSULE | Freq: Four times a day (QID) | ORAL | Status: DC | PRN

## 2013-08-16 MED ORDER — LACTATED RINGERS IV SOLN
INTRAVENOUS | Status: DC
  Administered 2013-08-16 (×3): via INTRAVENOUS

## 2013-08-16 MED ORDER — SCOPOLAMINE 1 MG/3DAYS TD PT72
MEDICATED_PATCH | TRANSDERMAL | Status: AC
  Administered 2013-08-16: 1.5 mg via TRANSDERMAL
  Filled 2013-08-16: qty 1

## 2013-08-16 MED ORDER — IBUPROFEN 600 MG PO TABS
600.0000 mg | ORAL_TABLET | Freq: Four times a day (QID) | ORAL | Status: DC
  Filled 2013-08-16: qty 1

## 2013-08-16 MED ORDER — ONDANSETRON HCL 4 MG/2ML IJ SOLN
INTRAMUSCULAR | Status: DC | PRN
  Administered 2013-08-16: 4 mg via INTRAVENOUS

## 2013-08-16 MED ORDER — NALOXONE HCL 0.4 MG/ML IJ SOLN: 0.4000 mg | INTRAMUSCULAR | Status: DC | PRN

## 2013-08-16 MED ORDER — ADALIMUMAB 40 MG/0.8ML ~~LOC~~ KIT: 40.0000 mg | PACK | SUBCUTANEOUS | Status: DC

## 2013-08-16 MED ORDER — BISACODYL 10 MG RE SUPP: 10.0000 mg | Freq: Every day | RECTAL | Status: DC | PRN

## 2013-08-16 MED ORDER — MEPERIDINE HCL 25 MG/ML IJ SOLN: 6.2500 mg | INTRAMUSCULAR | Status: DC | PRN

## 2013-08-16 MED ORDER — SIMETHICONE 80 MG PO CHEW
80.0000 mg | CHEWABLE_TABLET | ORAL | Status: DC | PRN
Start: 2013-08-16 — End: 2013-08-18

## 2013-08-16 MED ORDER — LIDOCAINE-EPINEPHRINE (PF) 2 %-1:200000 IJ SOLN
INTRAMUSCULAR | Status: AC
  Filled 2013-08-16: qty 20

## 2013-08-16 MED ORDER — ONDANSETRON HCL 4 MG/2ML IJ SOLN
4.0000 mg | INTRAMUSCULAR | Status: DC | PRN
Start: 2013-08-16 — End: 2013-08-18

## 2013-08-16 MED ORDER — KETOROLAC TROMETHAMINE 30 MG/ML IJ SOLN
INTRAMUSCULAR | Status: AC
  Administered 2013-08-16: 30 mg via INTRAMUSCULAR
  Filled 2013-08-16: qty 1

## 2013-08-16 MED ORDER — FLEET ENEMA 7-19 GM/118ML RE ENEM
1.0000 | ENEMA | Freq: Every day | RECTAL | Status: DC | PRN
Start: 2013-08-16 — End: 2013-08-18

## 2013-08-16 SURGICAL SUPPLY — 41 items
APL SKNCLS STERI-STRIP NONHPOA (GAUZE/BANDAGES/DRESSINGS) ×1
BENZOIN TINCTURE PRP APPL 2/3 (GAUZE/BANDAGES/DRESSINGS) ×3 IMPLANT
CLAMP CORD UMBIL (MISCELLANEOUS) IMPLANT
CLOSURE WOUND 1/2 X4 (GAUZE/BANDAGES/DRESSINGS) ×1
CLOTH BEACON ORANGE TIMEOUT ST (SAFETY) ×3 IMPLANT
CONTAINER PREFILL 10% NBF 15ML (MISCELLANEOUS) IMPLANT
DRAIN JACKSON PRT FLT 10 (DRAIN) IMPLANT
DRAPE LG THREE QUARTER DISP (DRAPES) IMPLANT
DRSG OPSITE POSTOP 4X10 (GAUZE/BANDAGES/DRESSINGS) ×3 IMPLANT
DURAPREP 26ML APPLICATOR (WOUND CARE) ×3 IMPLANT
ELECT REM PT RETURN 9FT ADLT (ELECTROSURGICAL) ×3
ELECTRODE REM PT RTRN 9FT ADLT (ELECTROSURGICAL) ×1 IMPLANT
EVACUATOR SILICONE 100CC (DRAIN) IMPLANT
EXTRACTOR VACUUM M CUP 4 TUBE (SUCTIONS) IMPLANT
EXTRACTOR VACUUM M CUP 4' TUBE (SUCTIONS)
GLOVE BIO SURGEON STRL SZ 6.5 (GLOVE) ×2 IMPLANT
GLOVE BIO SURGEONS STRL SZ 6.5 (GLOVE) ×1
GLOVE BIOGEL PI IND STRL 7.0 (GLOVE) ×1 IMPLANT
GLOVE BIOGEL PI INDICATOR 7.0 (GLOVE) ×2
GOWN STRL REUS W/ TWL XL LVL3 (GOWN DISPOSABLE) ×1 IMPLANT
GOWN STRL REUS W/TWL LRG LVL3 (GOWN DISPOSABLE) ×3 IMPLANT
GOWN STRL REUS W/TWL XL LVL3 (GOWN DISPOSABLE) ×2
KIT ABG SYR 3ML LUER SLIP (SYRINGE) IMPLANT
NEEDLE HYPO 25X5/8 SAFETYGLIDE (NEEDLE) IMPLANT
NS IRRIG 1000ML POUR BTL (IV SOLUTION) ×3 IMPLANT
PACK C SECTION WH (CUSTOM PROCEDURE TRAY) ×3 IMPLANT
PAD OB MATERNITY 4.3X12.25 (PERSONAL CARE ITEMS) ×3 IMPLANT
RTRCTR C-SECT PINK 25CM LRG (MISCELLANEOUS) ×3 IMPLANT
STAPLER VISISTAT 35W (STAPLE) IMPLANT
STRIP CLOSURE SKIN 1/2X4 (GAUZE/BANDAGES/DRESSINGS) ×2 IMPLANT
SUT CHROMIC 0 CT 1 (SUTURE) ×3 IMPLANT
SUT MNCRL AB 3-0 PS2 27 (SUTURE) IMPLANT
SUT PLAIN 2 0 (SUTURE)
SUT PLAIN 2 0 XLH (SUTURE) IMPLANT
SUT PLAIN ABS 2-0 CT1 27XMFL (SUTURE) IMPLANT
SUT SILK 2 0 SH (SUTURE) IMPLANT
SUT VIC AB 0 CTX 36 (SUTURE) ×8
SUT VIC AB 0 CTX36XBRD ANBCTRL (SUTURE) ×4 IMPLANT
TOWEL OR 17X24 6PK STRL BLUE (TOWEL DISPOSABLE) ×3 IMPLANT
TRAY FOLEY CATH 14FR (SET/KITS/TRAYS/PACK) ×3 IMPLANT
WATER STERILE IRR 1000ML POUR (IV SOLUTION) ×3 IMPLANT

## 2013-08-16 NOTE — Anesthesia Postprocedure Evaluation (Signed)
  Anesthesia Post Note  Patient: [name]  Procedure(s) Performed: Procedure(s) (LRB): Primary CESAREAN SECTION (N/A)  Anesthesia type:epi   Patient location: PACU  Post pain: Pain level controlled  Post assessment: Post-op Vital signs reviewed  Last Vitals:  Filed Vitals:   08/16/13 1545  BP: 120/40  Pulse: 115  Temp:   Resp: 20    Post vital signs: Reviewed  Level of consciousness: awake  Complications: No apparent anesthesia complications

## 2013-08-16 NOTE — H&P (Signed)
[name] is a 23 y.o. female presenting for cesarean section.  She plans for primary cesarean because of her chrons disease.  It was recommended that pt has a cesarean section to avoid rectal vaginal fistula by her gastroenterologist.  Pt has had several abdominal surgeries and general surgery is on back up for her surgery today.   History OB History   Grav Para Term Preterm Abortions TAB SAB Ect Mult Living   1              Past Medical History  Diagnosis Date  . 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 Candida esophagitis   . Ovarian cyst, left   . Anemia     iron def  . DVT (deep venous thrombosis) 08/2010    Left IJ (postop) anticoag x 3 mo  . Small bowel obstruction 2011  . Acute renal failure   . Allergy   . Blood transfusion   . Perianal fistula   . 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 Gastroparesis   . Blood transfusion without reported diagnosis   . Perirectal abscess 04/21/2013  . PONV (postoperative nausea and vomiting)   . Hypertension 2011    resolved - after 2011 surgery, no problems  . SVT (supraventricular tachycardia) 2011    resolved, after 2011 surgery, no problem  . Anxiety 2011    resolved - after 2011 surgery, no problem  . Depression 2011    resolved - after 2011 surgery, no problem  . History of blood transfusion 2011    [place]  2 units transfuses  . MRSA (methicillin resistant Staphylococcus aureus) 04/2010    multiple abscesses   Past Surgical History  Procedure Laterality Date  . Cholecystectomy  2009  . Closure of ileostomy/loop  08/2010  . Colectomy with diverting loop ileostomy  2011  . Appendectomy    . Esophagogastroduodenoscopy  11/11/2011    Procedure: ESOPHAGOGASTRODUODENOSCOPY (EGD);  Surgeon: Lafayette Dragon, MD;  [facility]: [facility];  Service: Endoscopy;  Laterality: N/A;  . Examination under anesthesia  11/12/2011    Procedure: EXAM UNDER ANESTHESIA;   Surgeon: Rolm Bookbinder, MD;  [facility]: [facility];  Service: General;  Laterality: N/A;  . Proctoscopy  11/12/2011    Procedure: PROCTOSCOPY;  Surgeon: Rolm Bookbinder, MD;  Location: [facility];  Service: General;  Laterality: N/A;  . Incision and drainage perirectal abscess  11/12/2011    Procedure: IRRIGATION AND DEBRIDEMENT PERIRECTAL ABSCESS;  Surgeon: Rolm Bookbinder, MD;  Location: [facility];  Service: General;  Laterality: N/A;  . Incision and drainage perirectal abscess  12/11/2011    Procedure: IRRIGATION AND DEBRIDEMENT PERIRECTAL ABSCESS;  Surgeon: [name], MD;  Location: [facility];  Service: General;  Laterality: N/A;  . Examination under anesthesia N/A 04/01/2013    Procedure: Jasmine December UNDER ANESTHESIA;  Surgeon: Stark Klein, MD;  [facility]: [facility];  Service: General;  Laterality: N/A;  . Incision and drainage perirectal abscess N/A 04/01/2013    Procedure: IRRIGATION AND DEBRIDEMENT PERIRECTAL ABSCESS;  Surgeon: Stark Klein, MD;  Location: [facility];  Service: General;  Laterality: N/A;  . Colonoscopy  2013   Family History: family history includes Diabetes in her cousin and maternal uncle; Emphysema in her maternal grandmother; Heart attack in her maternal grandmother; Heart disease in her maternal grandmother; Sickle cell trait in her father. There is no history of Colon cancer. Social History:  reports  that she has never smoked. She has never used smokeless tobacco. She reports that she does not drink alcohol or use illicit drugs.   Prenatal Transfer Tool  Maternal Diabetes: No Genetic Screening: Normal Maternal Ultrasounds/Referrals: Normal Fetal Ultrasounds or other Referrals:  None Maternal Substance Abuse:  No Significant Maternal Medications:  Meds include: Other: pt takes Humira Significant Maternal Lab Results:  None Other Comments:  pt has Chrons disease  ROS    Blood pressure 126/87, pulse 107, temperature 97.7 F (36.5 C), temperature source Oral, resp. rate 16, last  menstrual period 11/16/2012, SpO2 99.00%. Exam Physical Exam Physical Examination: General appearance - alert, well appearing, and in no distress Chest - clear to auscultation, no wheezes, rales or rhonchi, symmetric air entry Heart - normal rate and regular rhythm Abdomen - soft, nontender, nondistended, no masses or organomegaly gravid Extremities - Homan's sign negative bilaterally Prenatal labs: ABO, Rh: --/--/AB POS, AB POS (02/02 1125) Antibody: NEG (02/02 1125) Rubella: Immune (07/14 0000) RPR: NON REACTIVE (02/02 1125)  HBsAg: NEGATIVE (09/10 1241)  HIV: Non-reactive (07/14 0000)  GBS:   positive CBC    Component Value Date/Time   WBC 7.3 08/15/2013 1125   WBC 8.8 02/18/2010 1210   RBC 4.22 08/15/2013 1125   RBC 4.09 02/18/2010 1210   HGB 11.1* 08/15/2013 1125   HGB 10.5* 02/18/2010 1210   HCT 33.9* 08/15/2013 1125   HCT 32.5* 02/18/2010 1210   PLT 322 08/15/2013 1125   PLT 451* 02/18/2010 1210   MCV 80.3 08/15/2013 1125   MCV 79.4* 02/18/2010 1210   MCH 26.3 08/15/2013 1125   MCH 25.6 02/18/2010 1210   MCHC 32.7 08/15/2013 1125   MCHC 32.3 02/18/2010 1210   RDW 18.4* 08/15/2013 1125   RDW 17.6* 02/18/2010 1210   LYMPHSABS 1.6 05/19/2013 1059   LYMPHSABS 1.7 02/18/2010 1210   MONOABS 0.6 05/19/2013 1059   MONOABS 0.7 02/18/2010 1210   EOSABS 0.1 05/19/2013 1059   EOSABS 0.2 02/18/2010 1210   BASOSABS 0.0 05/19/2013 1059   BASOSABS 0.0 02/18/2010 1210     Assessment/Plan: 34w0dpresenting for primary cesarean due to h/o Chrons disease and prevention of rectovaginal fistula Pt understands her risk for bowel injury are significantly higher than a pt without history of multiple surgeries and Chrons disease R&B reviewed in detail.     DUintaA 08/16/2013, 12:37 PM

## 2013-08-16 NOTE — Interval H&P Note (Signed)
History and Physical Interval Note:  08/16/2013 1:24 PM  [name]  has presented today for surgery, with the diagnosis of Crohn's Disease  The various methods of treatment have been discussed with the patient and family. After consideration of risks, benefits and other options for treatment, the patient has consented to  Procedure(s): Primary CESAREAN SECTION (N/A) as a surgical intervention .  The patient's history has been reviewed, patient examined, no change in status, stable for surgery.  I have reviewed the patient's chart and labs.  Questions were answered to the patient's satisfaction.     [facility]

## 2013-08-16 NOTE — Anesthesia Procedure Notes (Signed)
Epidural Patient location during procedure: OB Start time: 08/16/2013 1:33 PM  Staffing Anesthesiologist: Rudean Curt Performed by: anesthesiologist   Preanesthetic Checklist Completed: patient identified, site marked, surgical consent, pre-op evaluation, timeout performed, IV checked, risks and benefits discussed and monitors and equipment checked  Epidural Patient position: sitting Prep: site prepped and draped and DuraPrep Patient monitoring: continuous pulse ox and blood pressure Approach: midline Injection technique: LOR air  Needle:  Needle type: Tuohy  Needle gauge: 17 G Needle length: 9 cm and 9 Needle insertion depth: 8 cm Catheter type: closed end flexible Catheter size: 19 Gauge Catheter at skin depth: 14 cm Test dose: negative  Assessment Events: blood not aspirated, injection not painful, no injection resistance, negative IV test and no paresthesia  Additional Notes Patient identified.  Risk benefits discussed including failed block, incomplete pain control, headache, nerve damage, paralysis, blood pressure changes, nausea, vomiting, reactions to medication both toxic or allergic, and postpartum back pain.  Patient expressed understanding and wished to proceed.  All questions were answered.  Sterile technique used throughout procedure and epidural site dressed with sterile barrier dressing. No paresthesia or other complications noted.The patient did not experience any signs of intravascular injection such as tinnitus or metallic taste in mouth nor signs of intrathecal spread such as rapid motor block. Please see nursing notes for vital signs.

## 2013-08-16 NOTE — Anesthesia Preprocedure Evaluation (Addendum)
Anesthesia Evaluation  Patient identified by MRN, date of birth, ID band Patient awake    Reviewed: Allergy & Precautions, H&P , NPO status , Patient's Chart, lab work & pertinent test results  History of Anesthesia Complications (+) PONV and history of anesthetic complications  Airway Mallampati: II      Dental   Pulmonary asthma ,  breath sounds clear to auscultation        Cardiovascular Exercise Tolerance: Good hypertension, + dysrhythmias Rhythm:regular Rate:Normal     Neuro/Psych    GI/Hepatic PUD, GERD-  ,  Endo/Other  Morbid obesity  Renal/GU      Musculoskeletal   Abdominal   Peds  Hematology  (+) anemia ,   Anesthesia Other Findings crohns  Reproductive/Obstetrics (+) Pregnancy                         Anesthesia Physical Anesthesia Plan  ASA: III  Anesthesia Plan: Epidural   Post-op Pain Management:    Induction:   Airway Management Planned:   Additional Equipment:   Intra-op Plan:   Post-operative Plan:   Informed Consent: I have reviewed the patients History and Physical, chart, labs and discussed the procedure including the risks, benefits and alternatives for the proposed anesthesia with the patient or authorized representative who has indicated his/her understanding and acceptance.     Plan Discussed with: Anesthesiologist, CRNA and Surgeon  Anesthesia Plan Comments:        Anesthesia Quick Evaluation

## 2013-08-16 NOTE — Transfer of Care (Signed)
Immediate Anesthesia Transfer of Care Note  Patient: [name]  Procedure(s) Performed: Procedure(s): Primary CESAREAN SECTION (N/A)  Patient Location: PACU  Anesthesia Type:Epidural  Level of Consciousness: awake, alert  and oriented  Airway & Oxygen Therapy: Patient Spontanous Breathing  Post-op Assessment: Report given to PACU RN and Post -op Vital signs reviewed and stable  Post vital signs: Reviewed and stable  Complications: No apparent anesthesia complications

## 2013-08-16 NOTE — Op Note (Signed)
Cesarean Section Procedure Note   [name]  08/16/2013  Indications: CHRONS DISEASE [redacted] WEEKS PREGNANT.  HERE FOR CS TO AVOID RECTOVAGINAL FISTULA   Pre-operative Diagnosis: Crohn's Disease.   Post-operative Diagnosis: Same   Surgeon: Surgeon(s) and Role:    * Betsy Coder, MD - Primary    * Delice Lesch, MD - Assisting   Assistants: Octavio Manns MD   Anesthesia: epidural   Procedure Details:  The patient was seen in the Holding Room. The risks, benefits, complications, treatment options, and expected outcomes were discussed with the patient. The patient concurred with the proposed plan, giving informed consent. identified as [name] and the procedure verified as C-Section Delivery. A Time Out was held and the above information confirmed.  After induction of anesthesia, the patient was draped and prepped in the usual sterile manner. A transverse incision was made and carried down through the subcutaneous tissue to the fascia. Fascial incision was made in the midline and extended transversely. The fascia was separated from the underlying rectus muscle superiorly and inferiorly. The peritoneum was identified and entered. Peritoneal incision was extended longitudinally with good visualization of bowel and bladder. The utero-vesical peritoneal reflection was incised transversely and the bladder flap was bluntly freed from the lower uterine segment.  An alexsis retractor was placed in the abdomen.   A low transverse uterine incision was made. Delivered from cephalic presentation was a  infant, with Apgar scores of 8 at one minute and 9 at five minutes. Cord ph was not sent the umbilical cord was clamped and cut cord blood was obtained for evaluation. The placenta was removed Intact and appeared normal. The uterine outline, tubes and ovaries appeared normal}. The uterine incision was closed with running locked sutures of 0Vicryl. A second layer 0 vicrlyl was used to imbricate the  uterine incision    Hemostasis was observed. Lavage was carried out until clear. The alexsis was removed.  The peritoneum was closed with 0 chromic.  The muscles were examined and any bleeders were made hemostatic using bovie cautery device.   The fascia was then reapproximated with running sutures of 0 vicryl.  The subcutaneous tissue was reapproximated  With interrupted stitches using 2-0 plain gut. The subcuticular closure was performed using 3-20moocryl     Instrument, sponge, and needle counts were correct prior the abdominal closure and were correct at the conclusion of the case.   After the procedure, I examined the vagina and rectum.  There is some induration on the left buttock.  The pts vagina was normal   Findings: infant was delivered from vertex presentation. The fluid was clear.  The uterus tubes and ovaries appeared normal.  Small adhesion from the colon to the bladder   Estimated Blood Loss: 8045m  Total IV Fluids: 260090m Urine Output: 100CC OF clear urine  Specimens: placenta to pathology  Complications: no complications  Disposition: PACU - hemodynamically stable.   Maternal Condition: stable   Baby condition / location:  Couplet care / Skin to Skin  Attending Attestation: I was present and scrubbed for the key portions of the procedure.   Signed: Surgeon(s): NaiBetsy CoderD AngDelice LeschD

## 2013-08-16 NOTE — Lactation Note (Signed)
This note was copied from the chart of [name]. Lactation Consultation Note  Patient Name: Anita Misaki Sozio PETKK'O Date: 08/16/2013 Reason for consult: Initial assessment;Other (Comment) (charting for exclusion) based on mom's choice on admission although she informs LC that she thought she might not be able to breastfeed because of certain medication but was told breastfeeding was ok.  Mom is sleepy and baby also asleep in [place], not showing hunger cues at this time. Mom says she does not know how to hand express her colostrum so she will ask her nurse to demonstrate later this evening.  Baby is 6 hours of age.  Initial feeding was 40 minutes with LATCH score=7 and baby is only 6 hours of age. LC encouraged review of Baby and Me pp 9, 14 and 20-25 for STS and BF information. LC provided [employer] brochure and reviewed [facility] services and list of community and web site resources.      Maternal Data Formula Feeding for Exclusion: Yes Reason for exclusion: Mother's choice to formula and breast feed on admission Infant to breast within first hour of birth: Yes Has patient been taught Hand Expression?: No (mom too sleepy but will ask her nurse to show her later) Does the patient have breastfeeding experience prior to this delivery?: No  Feeding Feeding Type: Breast Fed Length of feed: 5 min  LATCH Score/Interventions              initial LATCH score=7, per RN assessment        Lactation Tools Discussed/Used   STS, cue feeding  Consult Status Consult Status: Follow-up Date: 08/17/13 Follow-up type: In-patient    Junious Dresser [facility] 08/16/2013, 8:44 PM

## 2013-08-17 ENCOUNTER — Encounter (HOSPITAL_COMMUNITY): Payer: Self-pay | Admitting: Obstetrics and Gynecology

## 2013-08-17 LAB — CBC
HCT: 27.9 % — ABNORMAL LOW (ref 36.0–46.0)
Hemoglobin: 9.2 g/dL — ABNORMAL LOW (ref 12.0–15.0)
MCH: 26.7 pg (ref 26.0–34.0)
MCHC: 33 g/dL (ref 30.0–36.0)
MCV: 80.9 fL (ref 78.0–100.0)
PLATELETS: 305 10*3/uL (ref 150–400)
RBC: 3.45 MIL/uL — AB (ref 3.87–5.11)
RDW: 18.3 % — ABNORMAL HIGH (ref 11.5–15.5)
WBC: 11.3 10*3/uL — ABNORMAL HIGH (ref 4.0–10.5)

## 2013-08-17 MED ORDER — METOCLOPRAMIDE HCL 10 MG PO TABS
10.0000 mg | ORAL_TABLET | Freq: Three times a day (TID) | ORAL | Status: DC
  Administered 2013-08-18 (×2): 10 mg via ORAL
  Filled 2013-08-17 (×2): qty 1

## 2013-08-17 MED ORDER — ACETAMINOPHEN 325 MG PO TABS: 650.0000 mg | ORAL_TABLET | Freq: Four times a day (QID) | ORAL | Status: DC | PRN

## 2013-08-17 NOTE — Progress Notes (Signed)
Subjective: Postpartum Day 1: Primary LTCS due to Crohn's dx. Patient up ad lib, reports no syncope or dizziness. Feeding:  Breast Contraceptive plan:  Undecided  Objective: Vital signs in last 24 hours: Temp:  [97.3 F (36.3 C)-98.5 F (36.9 C)] 98.5 F (36.9 C) (02/04 0429) Pulse Rate:  [85-115] 104 (02/04 0431) Resp:  [16-24] 16 (02/04 0429) BP: (92-147)/(37-87) 126/86 mmHg (02/04 0431) SpO2:  [97 %-100 %] 100 % (02/04 0429) Weight:  [230 lb (104.327 kg)] 230 lb (104.327 kg) (02/03 1700)  Physical Exam:  General: alert Lochia: appropriate Uterine Fundus: firm Incision: Honeycomb dressing CDI--small amount old drainage noted. DVT Evaluation: No evidence of DVT seen on physical exam. Negative Homan's sign. On Clindamycin 300 mg TID x 5 days due to abscess in left buttock gluteal crease.  Area is approx 3 cm in diameter--patient reports minimal pain. No current drainage.    Recent Labs  08/15/13 1125 08/17/13 0610  HGB 11.1* 9.2*  HCT 33.9* 27.9*    Assessment/Plan: Status post Cesarean section day 1 Crohn's dx Healing abscess on left buttock Doing well postoperatively.  Continue current care. D/C'd Ibuprophen due to GI intolerance.    Mattilyn Crites, [place] 08/17/2013, 7:13 AM

## 2013-08-17 NOTE — Progress Notes (Signed)
Patient was referred for history of depression/anxiety. * Referral screened out by Clinical Social Worker because none of the following criteria appear to apply: ~ History of anxiety/depression during this pregnancy, or of post-partum depression. ~ Diagnosis of anxiety and/or depression within last 3 years ~ History of depression due to pregnancy loss/loss of child OR * Patient's symptoms currently being treated with medication and/or therapy. Please contact the Clinical Social Worker if needs arise, or if patient requests.

## 2013-08-17 NOTE — Progress Notes (Signed)
UR chart review completed.  

## 2013-08-17 NOTE — Progress Notes (Signed)
Subjective:  Postpartum Day 1: Cesarean Delivery Patient reports that she is doing well. Her pain is well-controlled.     Objective: Vital signs in last 24 hours: Temp:  [97.3 F (36.3 C)-98.5 F (36.9 C)] 98.5 F (36.9 C) (02/04 0429) Pulse Rate:  [85-115] 104 (02/04 0431) Resp:  [16-24] 16 (02/04 0429) BP: (92-147)/(37-86) 126/86 mmHg (02/04 0431) SpO2:  [97 %-100 %] 100 % (02/04 0429) Weight:  [230 lb (104.327 kg)] 230 lb (104.327 kg) (02/03 1700)  Physical Exam:  General: alert and no distress Lochia: appropriate Uterine Fundus: firm Incision: Dressing is clean and dry DVT Evaluation: No evidence of DVT seen on physical exam.   Recent Labs  08/15/13 1125 08/17/13 0610  HGB 11.1* 9.2*  HCT 33.9* 27.9*    Assessment/Plan:  Status post Cesarean section. Doing well postoperatively.  Anemia Crohn's disease Obesity  Continue current care. Discharge tomorrow discussed. Breast-feeding and bottlefeeding. Going well. Contraception discussed.  Eli Hose 08/17/2013, 12:26 PM

## 2013-08-17 NOTE — Lactation Note (Signed)
This note was copied from the chart of [name]. Lactation Consultation Note  Patient Name: [name] [id number] Date: 08/17/2013 Reason for consult: Follow-up assessment;Breast/nipple pain, especially (L) nipple where baby prefers latching and has cracked nipple base.  Mom states "too sore" to nurse or pump right now but is pumping (R) breast but #27 flange tight, so LC suggested trying #30 and this allows more room for nipple to stretch comfortably into flange.  LC provided lanolin for mom to apply sparingly to inside of flange when she pumps and comfort gelpads to wear between feedings or pumpings to help soothe and heal nipples.  LC stressed importance of frequent pumping, 15 minutes every 3 hours (mom wants to start pumping or resume nursing on (L) in early am.  Mom to contact [place] and LC demonstrated hand pump option in double kit, as well as [facility] loaner option if there is a delay in obtaining WIC pump.   Maternal Data    Feeding    LATCH Score/Interventions         Previous feeding, LATCH score, epr RN assessment=8 but now (L) nipple base cracked             Lactation Tools Discussed/Used Tools: Lanolin;Pump;Comfort gels;Flanges Flange Size: 30 (large diameter nipples; needed larger flanges) Breast pump type: Double-Electric Breast Pump WIC Program: Yes Pump Review: Setup, frequency, and cleaning;Milk Storage Initiated by:: RN initiated and [name] reinforced and assisted with larger flange size Date initiated:: 08/17/13   Consult Status Consult Status: Follow-up Date: 08/18/13 Follow-up type: In-patient    [name] [facility] 08/17/2013, 10:59 PM

## 2013-08-18 MED ORDER — DOCUSATE SODIUM 100 MG PO CAPS: 100.0000 mg | ORAL_CAPSULE | Freq: Two times a day (BID) | ORAL | Status: DC

## 2013-08-18 MED ORDER — OXYCODONE-ACETAMINOPHEN 5-325 MG PO TABS: 1.0000 | ORAL_TABLET | ORAL | Status: DC | PRN

## 2013-08-18 MED ORDER — PROMETHAZINE HCL 12.5 MG PO TABS: 12.5000 mg | ORAL_TABLET | Freq: Four times a day (QID) | ORAL | Status: DC | PRN

## 2013-08-18 NOTE — Discharge Instructions (Signed)
Cesarean Delivery Care After Refer to this sheet in the next few weeks. These instructions provide you with information on caring for yourself after your procedure. Your health care provider may also give you specific instructions. Your treatment has been planned according to current medical practices, but problems sometimes occur. Call your health care provider if you have any problems or questions after you go home. HOME CARE INSTRUCTIONS  Only take over-the-counter or prescription medications as directed by your health care provider.  Do not drink alcohol, especially if you are breastfeeding or taking medication to relieve pain.  Do not chew or smoke tobacco.  Continue to use good perineal care. Good perineal care includes:  Wiping your perineum from front to back.  Keeping your perineum clean.  Check your surgical cut (incision) daily for increased redness, drainage, swelling, or separation of skin.  Clean your incision gently with soap and water every day, and then pat it dry. If your health care provider says it is OK, leave the incision uncovered. Use a bandage (dressing) if the incision is draining fluid or appears irritated. If the adhesive strips across the incision do not fall off within 7 days, carefully peel them off.  Hug a pillow when coughing or sneezing until your incision is healed. This helps to relieve pain.  Do not use tampons or douche until your health care provider says it is okay.  Shower, wash your hair, and take tub baths as directed by your health care provider.  Wear a well-fitting bra that provides breast support.  Limit wearing support panties or control-top hose.  Drink enough fluids to keep your urine clear or pale yellow.  Eat high-fiber foods such as whole grain cereals and breads, brown rice, beans, and fresh fruits and vegetables every day. These foods may help prevent or relieve constipation.  Resume activities such as climbing stairs,  driving, lifting, exercising, or traveling as directed by your health care provider.  Talk to your health care provider about resuming sexual activities. This is dependent upon your risk of infection, your rate of healing, and your comfort and desire to resume sexual activity.  Try to have someone help you with your household activities and your newborn for at least a few days after you leave the hospital.  Rest as much as possible. Try to rest or take a nap when your newborn is sleeping.  Increase your activities gradually.  Keep all of your scheduled postpartum appointments. It is very important to keep your scheduled follow-up appointments. At these appointments, your health care provider will be checking to make sure that you are healing physically and emotionally. SEEK MEDICAL CARE IF:   You are passing large clots from your vagina. Save any clots to show your health care provider.  You have a foul smelling discharge from your vagina.  You have trouble urinating.  You are urinating frequently.  You have pain when you urinate.  You have a change in your bowel movements.  You have increasing redness, pain, or swelling near your incision.  You have pus draining from your incision.  Your incision is separating.  You have painful, hard, or reddened breasts.  You have a severe headache.  You have blurred vision or see spots.  You feel sad or depressed.  You have thoughts of hurting yourself or your newborn.  You have questions about your care, the care of your newborn, or medications.  You are dizzy or lightheaded.  You have a rash.  You  have pain, redness, or swelling at the site of the removed intravenous access (IV) tube.  You have nausea or vomiting.  You stopped breastfeeding and have not had a menstrual period within 12 weeks of stopping.  You are not breastfeeding and have not had a menstrual period within 12 weeks of delivery.  You have a fever. SEEK  IMMEDIATE MEDICAL CARE IF:  You have persistent pain.  You have chest pain.  You have shortness of breath.  You faint.  You have leg pain.  You have stomach pain.  Your vaginal bleeding saturates 2 or more sanitary pads in 1 hour. MAKE SURE YOU:   Understand these instructions.  Will watch your condition.  Will get help right away if you are not doing well or get worse. Document Released: 03/22/2002 Document Revised: 03/02/2013 Document Reviewed: 02/25/2012 [facility] Patient Information 2014 [place]. Postpartum Depression and Baby Blues The postpartum period begins right after the birth of a baby. During this time, there is often a great amount of joy and excitement. It is also a time of considerable changes in the life of the parent(s). Regardless of how many times a mother gives birth, each child brings new challenges and dynamics to the family. It is not unusual to have feelings of excitement accompanied by confusing shifts in moods, emotions, and thoughts. All mothers are at risk of developing postpartum depression or the "baby blues." These mood changes can occur right after giving birth, or they may occur many months after giving birth. The baby blues or postpartum depression can be mild or severe. Additionally, postpartum depression can resolve rather quickly, or it can be a long-term condition. CAUSES Elevated hormones and their rapid decline are thought to be a main cause of postpartum depression and the baby blues. There are a number of hormones that radically change during and after pregnancy. Estrogen and progesterone usually decrease immediately after delivering your baby. The level of thyroid hormone and various cortisol steroids also rapidly drop. Other factors that play a major role in these changes include major life events and genetics.  RISK FACTORS If you have any of the following risks for the baby blues or postpartum depression, know what symptoms to watch  out for during the postpartum period. Risk factors that may increase the likelihood of getting the baby blues or postpartum depression include:  Havinga personal or family history of depression.  Having depression while being pregnant.  Having premenstrual or oral contraceptive-associated mood issues.  Having exceptional life stress.  Having marital conflict.  Lacking a social support network.  Having a baby with special needs.  Having health problems such as diabetes. SYMPTOMS Baby blues symptoms include:  Brief fluctuations in mood, such as going from extreme happiness to sadness.  Decreased concentration.  Difficulty sleeping.  Crying spells, tearfulness.  Irritability.  Anxiety. Postpartum depression symptoms typically begin within the first month after giving birth. These symptoms include:  Difficulty sleeping or excessive sleepiness.  Marked weight loss.  Agitation.  Feelings of worthlessness.  Lack of interest in activity or food. Postpartum psychosis is a very concerning condition and can be dangerous. Fortunately, it is rare. Displaying any of the following symptoms is cause for immediate medical attention. Postpartum psychosis symptoms include:  Hallucinations and delusions.  Bizarre or disorganized behavior.  Confusion or disorientation. DIAGNOSIS  A diagnosis is made by an evaluation of your symptoms. There are no medical or lab tests that lead to a diagnosis, but there are various questionnaires that  a caregiver may use to identify those with the baby blues, postpartum depression, or psychosis. Often times, a screening tool called the [place] Postnatal Depression Scale is used to diagnose depression in the postpartum period.  TREATMENT The baby blues usually goes away on its own in 1 to 2 weeks. Social support is often all that is needed. You should be encouraged to get adequate sleep and rest. Occasionally, you may be given medicines to help you  sleep.  Postpartum depression requires treatment as it can last several months or longer if it is not treated. Treatment may include individual or group therapy, medicine, or both to address any social, physiological, and psychological factors that may play a role in the depression. Regular exercise, a healthy diet, rest, and social support may also be strongly recommended.  Postpartum psychosis is more serious and needs treatment right away. Hospitalization is often needed. HOME CARE INSTRUCTIONS  Get as much rest as you can. Nap when the baby sleeps.  Exercise regularly. Some women find yoga and walking to be beneficial.  Eat a balanced and nourishing diet.  Do little things that you enjoy. Have a cup of tea, take a bubble bath, read your favorite magazine, or listen to your favorite music.  Avoid alcohol.  Ask for help with household chores, cooking, grocery shopping, or running errands as needed. Do not try to do everything.  Talk to people close to you about how you are feeling. Get support from your partner, family members, friends, or other new moms.  Try to stay positive in how you think. Think about the things you are grateful for.  Do not spend a lot of time alone.  Only take medicine as directed by your caregiver.  Keep all your postpartum appointments.  Let your caregiver know if you have any concerns. SEEK MEDICAL CARE IF: You are having a reaction or problems with your medicine. SEEK IMMEDIATE MEDICAL CARE IF:  You have suicidal feelings.  You feel you may harm the baby or someone else. Document Released: 04/03/2004 Document Revised: 09/22/2011 Document Reviewed: 04/11/2013 [facility] Patient Information 2014 [place], [place].

## 2013-08-18 NOTE — Lactation Note (Signed)
This note was copied from the chart of Anita Kamea Dacosta. Lactation Consultation Note  Patient Name: [name] [id number] Date: 08/18/2013 Reason for consult: Follow-up assessment Mom reports breastfeeding going well. Assisted mom to latch baby in the football position. Mom had been using cross-cradle. Mom able to achieve a deep latch, baby settled into rhythmic sucking and swallowing. Mom enc to call [facility] for an appointment because mom wants a DEBP for later. Given a hand pump with instruction. Reviewed basics. Reviewed engorgement prevention/treatment. Printed out information from [facility] regarding Mercaptopurine medication and gave to mom.  Dr. Jess Barters reviewed medication risks with patient. Mom wants to continue breastfeeding. Refitted mom with #27 flanges, and mom given comfort gels. Referred mom to Baby and Me booklet for milk storage information. Mom aware of OP/BFSG services.   Maternal Data    Feeding Feeding Type: Breast Fed Length of feed: 20 min  LATCH Score/Interventions Latch: Grasps breast easily, tongue down, lips flanged, rhythmical sucking. Intervention(s): Adjust position  Audible Swallowing: Spontaneous and intermittent  Type of Nipple: Everted at rest and after stimulation  Comfort (Breast/Nipple): Soft / non-tender     Hold (Positioning): No assistance needed to correctly position infant at breast.  LATCH Score: 10  Lactation Tools Discussed/Used Tools: Comfort gels;Pump;Flanges Breast pump type: Manual   Consult Status Consult Status: Complete    [name], [name] 08/18/2013, 2:04 PM

## 2013-08-18 NOTE — Anesthesia Postprocedure Evaluation (Signed)
  Anesthesia Post-op Note  Patient: [name]  Procedure(s) Performed: Procedure(s): Primary CESAREAN SECTION (N/A)  Patient Location: Mother/Baby  Anesthesia Type:Spinal  Level of Consciousness: awake  Airway and Oxygen Therapy: Patient Spontanous Breathing  Post-op Pain: mild  Post-op Assessment: Patient's Cardiovascular Status Stable and Respiratory Function Stable  Post-op Vital Signs: stable  Complications: No apparent anesthesia complications

## 2013-08-29 ENCOUNTER — Telehealth: Payer: Self-pay | Admitting: Internal Medicine

## 2013-08-29 MED ORDER — MERCAPTOPURINE 50 MG PO TABS: 100.0000 mg | ORAL_TABLET | Freq: Every day | ORAL | Status: DC

## 2013-08-29 NOTE — Telephone Encounter (Signed)
Patient is asking for Mercaptopurine rx to be switched to [employer]. rx sent as requested.

## 2013-09-05 NOTE — Discharge Summary (Signed)
Cesarean Section Delivery Discharge Summary  [name]  DOB:    03-01-1991 MRN:    [id number] CSN:    [id number]  Date of admission:                  08/16/13  Date of discharge:                   08/18/13   Procedures this admission: primary LTCS by Dr Charlesetta Garibaldi   Date of Delivery: 08/16/13   Newborn Data:  Live born female  Birth Weight: 6 lb 7.5 oz (2934 g) APGAR: 8, 9  Home with: mother  History of Present Illness:  [name] is a 23 y.o. female, G1P1001, who presents at 49w0dweeks gestation. [facility]    Her pregnancy has been complicated by:  Patient Active Problem List   Diagnosis Date Noted  . Cesarean delivery delivered 08/16/2013  . B12 deficiency 07/04/2013  . Perirectal abscess 04/21/2013  . Patient currently pregnant 04/21/2013  . Fever 07/23/2012  . Upper abdominal pain 03/12/2012  . Partial small bowel obstruction 03/12/2012  . Nausea and vomiting 03/02/2012  . Unspecified asthma 03/02/2012  . GERD (gastroesophageal reflux disease) 03/02/2012  . Vaginal candida 02/18/2012  . Abdominal pain 02/17/2012  . Abscess of anal and rectal regions 12/05/2011  . Acute renal failure 11/16/2011  . SBO (small bowel obstruction) 11/08/2011  . Hypokalemia 11/08/2011  . Anxiety 11/08/2011  . Cellulitis 11/08/2011  . Acute sinus infection 06/27/2011  . Acute bronchitis 06/27/2011  . NAUSEA, CHRONIC 09/06/2010  . ACUTE VENOUS EMBO & THROMB INTRL JUGULAR VEINS 08/31/2010  . GENITAL HERPES 12/19/2008  . UTI'S, RECURRENT 01/25/2008  . ANEMIA, IRON DEFICIENCY, CHRONIC 09/17/2007  . MENORRHAGIA 09/17/2007  . CROHN'S DISEASE 02/29/2004  . ACUT DUOD ULCER W/O MENTION HEMORR PERF/OBST 10/18/2003   none.  Hospital course:  The patient was admitted for C/S.  Her postpartum course was not complicated.  She was discharged to home on postpartum  day 2 doing well.  Feeding:  breast  Contraception:  condoms  Discharge hemoglobin:  HGB  Date Value Ref Range Status  02/18/2010 10.5* 11.6 - 15.9 g/dL Final     Hemoglobin  Date Value Ref Range Status  08/17/2013 9.2* 12.0 - 15.0 g/dL Final     REPEATED TO VERIFY     DELTA CHECK NOTED     HCT  Date Value Ref Range Status  08/17/2013 27.9* 36.0 - 46.0 % Final  02/18/2010 32.5* 34.8 - 46.6 % Final    Discharge Physical Exam:   General: alert and no distress Lochia: appropriate Uterine Fundus: firm Incision: healing well DVT Evaluation: No evidence of DVT seen on physical exam.  Intrapartum Procedures: cesarean: low cervical, transverse Postpartum Procedures: none Complications-Operative and Postpartum: none  Discharge Diagnoses: Term Pregnancy-delivered  Discharge Information:  Activity:           pelvic rest Diet:                routine Medications: PNV, Ibuprofen and Percocet Condition:      stable Instructions:  Care After Cesarean Delivery  Refer to this sheet in the next few weeks. These instructions provide you with information on caring for yourself after your procedure. Your caregiver may also give you specific instructions. Your treatment has been planned according to current medical practices, but problems sometimes occur.  Call your caregiver if you have any problems or questions after you go home. HOME CARE INSTRUCTIONS  Only take over-the-counter or prescription medicines as directed by your caregiver.  Do not drink alcohol, especially if you are breastfeeding or taking medicine to relieve pain.  Do not chew or smoke tobacco.  Continue to use good perineal care. Good perineal care includes:  Wiping your perineum from front to back.  Keeping your perineum clean.  Check your cut (incision) daily for increased redness, drainage, swelling, or separation of skin.  Clean your incision gently with soap and water every day, and then pat it dry. If  your caregiver says it is okay, leave the incision uncovered. Use a bandage (dressing) if the incision is draining fluid or appears irritated. If the adhesive strips across the incision do not fall off within 7 days, carefully peel them off.  Hug a pillow when coughing or sneezing until your incision is healed. This helps to relieve pain.  Do not use tampons or douche until your caregiver says it is okay.  Shower, wash your hair, and take tub baths as directed by your caregiver.  Wear a well-fitting bra that provides breast support.  Limit wearing support panties or control-top hose.  Drink enough fluids to keep your urine clear or pale yellow.  Eat high-fiber foods such as whole grain cereals and breads, brown rice, beans, and fresh fruits and vegetables every day. These foods may help prevent or relieve constipation.  Resume activities such as climbing stairs, driving, lifting, exercising, or traveling as directed by your caregiver.  Talk to your caregiver about resuming sexual activities. This is dependent upon your risk of infection, your rate of healing, and your comfort and desire to resume sexual activity.  Try to have someone help you with your household activities and your newborn for at least a few days after you leave the hospital.  Rest as much as possible. Try to rest or take a nap when your newborn is sleeping.  Increase your activities gradually.  Keep all of your scheduled postpartum appointments. It is very important to keep your scheduled follow-up appointments. At these appointments, your caregiver will be checking to make sure that you are healing physically and emotionally. SEEK MEDICAL CARE IF:   You are passing large clots from your vagina. Save any clots to show your caregiver.  You have a foul smelling discharge from your vagina.  You have trouble urinating.  You are urinating frequently.  You have pain when you urinate.  You have a change in your bowel  movements.  You have increasing redness, pain, or swelling near your incision.  You have pus draining from your incision.  Your incision is separating.  You have painful, hard, or reddened breasts.  You have a severe headache.  You have blurred vision or see spots.  You feel sad or depressed.  You have thoughts of hurting yourself or your newborn.  You have questions about your care, the care of your newborn, or medicines.  You are dizzy or lightheaded.  You have a rash.  You have pain, redness, or swelling at the site of the removed intravenous access (IV) tube.  You have nausea or vomiting.  You stopped breastfeeding and have not had a menstrual period within 12 weeks of stopping.  You are not breastfeeding and have not had a menstrual period within 12 weeks of delivery.  You have a fever. SEEK IMMEDIATE MEDICAL CARE IF:  You have persistent  pain.  You have chest pain.  You have shortness of breath.  You faint.  You have leg pain.  You have stomach pain.  Your vaginal bleeding saturates 2 or more sanitary pads in 1 hour. MAKE SURE YOU:   Understand these instructions.  Will watch your condition.  Will get help right away if you are not doing well or get worse. Document Released: 03/22/2002 Document Revised: 03/24/2012 Document Reviewed: 02/25/2012 [facility] Patient Information 2014 [place].   Postpartum Depression and Baby Blues  The postpartum period begins right after the birth of a baby. During this time, there is often a great amount of joy and excitement. It is also a time of considerable changes in the life of the parent(s). Regardless of how many times a mother gives birth, each child brings new challenges and dynamics to the family. It is not unusual to have feelings of excitement accompanied by confusing shifts in moods, emotions, and thoughts. All mothers are at risk of developing postpartum depression or the "baby blues." These mood  changes can occur right after giving birth, or they may occur many months after giving birth. The baby blues or postpartum depression can be mild or severe. Additionally, postpartum depression can resolve rather quickly, or it can be a long-term condition. CAUSES Elevated hormones and their rapid decline are thought to be a main cause of postpartum depression and the baby blues. There are a number of hormones that radically change during and after pregnancy. Estrogen and progesterone usually decrease immediately after delivering your baby. The level of thyroid hormone and various cortisol steroids also rapidly drop. Other factors that play a major role in these changes include major life events and genetics.  RISK FACTORS If you have any of the following risks for the baby blues or postpartum depression, know what symptoms to watch out for during the postpartum period. Risk factors that may increase the likelihood of getting the baby blues or postpartum depression include:  Havinga personal or family history of depression.  Having depression while being pregnant.  Having premenstrual or oral contraceptive-associated mood issues.  Having exceptional life stress.  Having marital conflict.  Lacking a social support network.  Having a baby with special needs.  Having health problems such as diabetes. SYMPTOMS Baby blues symptoms include:  Brief fluctuations in mood, such as going from extreme happiness to sadness.  Decreased concentration.  Difficulty sleeping.  Crying spells, tearfulness.  Irritability.  Anxiety. Postpartum depression symptoms typically begin within the first month after giving birth. These symptoms include:  Difficulty sleeping or excessive sleepiness.  Marked weight loss.  Agitation.  Feelings of worthlessness.  Lack of interest in activity or food. Postpartum psychosis is a very concerning condition and can be dangerous. Fortunately, it is rare.  Displaying any of the following symptoms is cause for immediate medical attention. Postpartum psychosis symptoms include:  Hallucinations and delusions.  Bizarre or disorganized behavior.  Confusion or disorientation. DIAGNOSIS  A diagnosis is made by an evaluation of your symptoms. There are no medical or lab tests that lead to a diagnosis, but there are various questionnaires that a caregiver may use to identify those with the baby blues, postpartum depression, or psychosis. Often times, a screening tool called the [place] Postnatal Depression Scale is used to diagnose depression in the postpartum period.  TREATMENT The baby blues usually goes away on its own in 1 to 2 weeks. Social support is often all that is needed. You should be encouraged to get  adequate sleep and rest. Occasionally, you may be given medicines to help you sleep.  Postpartum depression requires treatment as it can last several months or longer if it is not treated. Treatment may include individual or group therapy, medicine, or both to address any social, physiological, and psychological factors that may play a role in the depression. Regular exercise, a healthy diet, rest, and social support may also be strongly recommended.  Postpartum psychosis is more serious and needs treatment right away. Hospitalization is often needed. HOME CARE INSTRUCTIONS  Get as much rest as you can. Nap when the baby sleeps.  Exercise regularly. Some women find yoga and walking to be beneficial.  Eat a balanced and nourishing diet.  Do little things that you enjoy. Have a cup of tea, take a bubble bath, read your favorite magazine, or listen to your favorite music.  Avoid alcohol.  Ask for help with household chores, cooking, grocery shopping, or running errands as needed. Do not try to do everything.  Talk to people close to you about how you are feeling. Get support from your partner, family members, friends, or other new  moms.  Try to stay positive in how you think. Think about the things you are grateful for.  Do not spend a lot of time alone.  Only take medicine as directed by your caregiver.  Keep all your postpartum appointments.  Let your caregiver know if you have any concerns. SEEK MEDICAL CARE IF: You are having a reaction or problems with your medicine. SEEK IMMEDIATE MEDICAL CARE IF:  You have suicidal feelings.  You feel you may harm the baby or someone else. Document Released: 04/03/2004 Document Revised: 09/22/2011 Document Reviewed: 05/06/2011 [facility] Patient Information 2014 [place], [place].  Discharge to: home  Follow-up Information   Follow up with [facility] A, MD In 5 weeks.   Specialty:  Obstetrics and Gynecology   Contact information:   [address] [place] [address] [phone]        Charlane Ferretti 09/05/2013

## 2013-10-04 ENCOUNTER — Encounter: Payer: Self-pay | Admitting: Internal Medicine

## 2013-10-04 ENCOUNTER — Ambulatory Visit (INDEPENDENT_AMBULATORY_CARE_PROVIDER_SITE_OTHER): Payer: Medicaid Other | Admitting: Internal Medicine

## 2013-10-04 VITALS — BP 110/78 | HR 84 | Ht 64.0 in | Wt 224.1 lb

## 2013-10-04 DIAGNOSIS — K5010 Crohn's disease of large intestine without complications: Secondary | ICD-10-CM

## 2013-10-04 DIAGNOSIS — Z79899 Other long term (current) drug therapy: Secondary | ICD-10-CM

## 2013-10-04 DIAGNOSIS — K603 Anal fistula: Secondary | ICD-10-CM

## 2013-10-04 DIAGNOSIS — K5090 Crohn's disease, unspecified, without complications: Secondary | ICD-10-CM

## 2013-10-04 DIAGNOSIS — D849 Immunodeficiency, unspecified: Secondary | ICD-10-CM

## 2013-10-04 NOTE — Patient Instructions (Addendum)
Please follow up with Dr [name] in 6 weeks. Ob-Gyn doctor, Dr Asa Lente

## 2013-10-04 NOTE — Progress Notes (Signed)
[name] 23-Dec-1990 [id number]  Note: This dictation was prepared with Dragon digital system. Any transcriptional errors that result from this procedure are unintentional.   History of Present Illness: This is a 23 year old [place] American female this is her first office visit since C-section on February 3 which was uneventful. She has  a complicated Crohn's disease diagnosed in 59at age 8.. Initially in the duodenum, later on in terminal ileum. She has aectovaginal fistula. History of terminal ileal resection in 2012. Last colonoscopy in 2013 showed anastomotic ulcer. She is doing very well. She has had increased frequency of stools 2 to 3 timesa day but she denies rectal bleeding or crampy abdominal pain. There has been no fever, nausea or vomiting. She remains on Humira 40 mg every 2 weeks and on 6-MP 100 mg daily. She has a history of C. difficile colitis and B12 deficiency. Upper endoscopy in April 2013 was normal and showed no evidence of recurrent Crohn's disease in the duodenum.    Past Medical History  Diagnosis Date  . 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 Candida esophagitis   . Ovarian cyst, left   . Anemia     iron def  . DVT (deep venous thrombosis) 08/2010    Left IJ (postop) anticoag x 3 mo  . Small bowel obstruction 2011  . Acute renal failure   . Allergy   . Blood transfusion   . Perianal fistula   . 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 Gastroparesis   . Blood transfusion without reported diagnosis   . Perirectal abscess 04/21/2013  . PONV (postoperative nausea and vomiting)   . Hypertension 2011    resolved - after 2011 surgery, no problems  . SVT (supraventricular tachycardia) 2011    resolved, after 2011 surgery, no problem  . Anxiety 2011    resolved - after 2011 surgery, no problem  . Depression 2011    resolved - after 2011 surgery, no problem  . History of blood transfusion  2011    [place]  2 units transfuses  . MRSA (methicillin resistant Staphylococcus aureus) 04/2010    multiple abscesses    Past Surgical History  Procedure Laterality Date  . Cholecystectomy  2009  . Closure of ileostomy/loop  08/2010  . Colectomy with diverting loop ileostomy  2011  . Appendectomy    . Esophagogastroduodenoscopy  11/11/2011    Procedure: ESOPHAGOGASTRODUODENOSCOPY (EGD);  Surgeon: Lafayette Dragon, MD;  Location: [facility];  Service: Endoscopy;  Laterality: N/A;  . Examination under anesthesia  11/12/2011    Procedure: EXAM UNDER ANESTHESIA;  Surgeon: Rolm Bookbinder, MD;  Location: [facility];  Service: General;  Laterality: N/A;  . Proctoscopy  11/12/2011    Procedure: PROCTOSCOPY;  Surgeon: Rolm Bookbinder, MD;  Location: [facility];  Service: General;  Laterality: N/A;  . Incision and drainage perirectal abscess  11/12/2011    Procedure: IRRIGATION AND DEBRIDEMENT PERIRECTAL ABSCESS;  Surgeon: Rolm Bookbinder, MD;  Location: [facility];  Service: General;  Laterality: N/A;  . Incision and drainage perirectal abscess  12/11/2011    Procedure: IRRIGATION AND DEBRIDEMENT PERIRECTAL ABSCESS;  Surgeon: [name], MD;  Location: [facility];  Service: General;  Laterality: N/A;  . Examination under anesthesia N/A 04/01/2013    Procedure: Jasmine December UNDER ANESTHESIA;  Surgeon: Stark Klein, MD;  Location: [facility];  Service: General;  Laterality: N/A;  . Incision and drainage  perirectal abscess N/A 04/01/2013    Procedure: IRRIGATION AND DEBRIDEMENT PERIRECTAL ABSCESS;  Surgeon: Stark Klein, MD;  Location: [facility];  Service: General;  Laterality: N/A;  . Colonoscopy  2013  . Cesarean section N/A 08/16/2013    Procedure: Primary CESAREAN SECTION;  Surgeon: Betsy Coder, MD;  Location: [facility];  Service: Obstetrics;  Laterality: N/A;    Allergies  Allergen Reactions  . Iron Dextran Anaphylaxis    Family history and social history have been reviewed.  Review of Systems:   The remainder  of the 10 point ROS is negative except as outlined in the H&P  Physical Exam: General Appearance Well developed, in no distress overweight Eyes  Non icteric  HEENT  Non traumatic, normocephalic  Mouth No lesion, tongue papillated, no cheilosis Neck Supple without adenopathy, thyroid not enlarged, no carotid bruits, no JVD Lungs Clear to auscultation bilaterally COR Normal S1, normal S2, regular rhythm, no murmur, quiet precordium Abdomen protuberant soft with minimal tenderness across her lower abdomen. Well-healed C-section scar Rectal not done Extremities  No pedal edema Skin No lesions Neurological Alert and oriented x 3 Psychological Normal mood and affect  Assessment and Plan:   23 year old AA female with Crohn's disease now 7 weeks since C-section. Doing well. Her stools are increased in frequency. This may potentially indicates recurrence of Crohn's disease but she is otherwise feeling well. We will continue the same medication and will see her in the 6 weeks. At that time we will obtain blood work and make a decision whether she can go back to work  Follow up Ob-GYN for continued vag. bleeding    [name] 10/04/2013

## 2013-10-10 ENCOUNTER — Telehealth: Payer: Self-pay | Admitting: Internal Medicine

## 2013-10-10 NOTE — Telephone Encounter (Signed)
Questions answered.

## 2013-11-15 ENCOUNTER — Ambulatory Visit: Payer: Medicaid Other | Admitting: Internal Medicine

## 2013-11-15 ENCOUNTER — Telehealth: Payer: Self-pay | Admitting: Internal Medicine

## 2013-11-15 NOTE — Telephone Encounter (Signed)
No charge. 

## 2013-11-15 NOTE — Telephone Encounter (Signed)
Dr [name], please advise

## 2013-12-12 ENCOUNTER — Telehealth: Payer: Self-pay | Admitting: Internal Medicine

## 2013-12-12 MED ORDER — MERCAPTOPURINE 50 MG PO TABS: 100.0000 mg | ORAL_TABLET | Freq: Every day | ORAL | Status: DC

## 2013-12-12 MED ORDER — PROMETHAZINE HCL 12.5 MG PO TABS: 12.5000 mg | ORAL_TABLET | Freq: Four times a day (QID) | ORAL | Status: DC | PRN

## 2013-12-12 NOTE — Telephone Encounter (Signed)
Advised we have no samples of 59m, but I will send her an rx. She states that the 650mis around $300 as she currently has no insurance. She is awaiting reinstatement from Medicaid and will call for an appointment as soon as she get this. I have gone ahead and sent a refill of 10m3mnd phenergan to her pharmacy for her to pick up when she gets the funds to do so.

## 2013-12-20 ENCOUNTER — Ambulatory Visit (INDEPENDENT_AMBULATORY_CARE_PROVIDER_SITE_OTHER): Payer: Medicaid Other | Admitting: Internal Medicine

## 2013-12-20 ENCOUNTER — Other Ambulatory Visit (INDEPENDENT_AMBULATORY_CARE_PROVIDER_SITE_OTHER): Payer: Medicaid Other

## 2013-12-20 ENCOUNTER — Encounter: Payer: Self-pay | Admitting: Internal Medicine

## 2013-12-20 VITALS — BP 120/84 | HR 70 | Ht 64.0 in | Wt 225.2 lb

## 2013-12-20 DIAGNOSIS — D509 Iron deficiency anemia, unspecified: Secondary | ICD-10-CM

## 2013-12-20 DIAGNOSIS — R197 Diarrhea, unspecified: Secondary | ICD-10-CM

## 2013-12-20 DIAGNOSIS — Z79899 Other long term (current) drug therapy: Secondary | ICD-10-CM

## 2013-12-20 DIAGNOSIS — K5090 Crohn's disease, unspecified, without complications: Secondary | ICD-10-CM | POA: Diagnosis not present

## 2013-12-20 DIAGNOSIS — D849 Immunodeficiency, unspecified: Secondary | ICD-10-CM

## 2013-12-20 LAB — CBC WITH DIFFERENTIAL/PLATELET
BASOS ABS: 0 10*3/uL (ref 0.0–0.1)
Basophils Relative: 0.4 % (ref 0.0–3.0)
EOS ABS: 0 10*3/uL (ref 0.0–0.7)
Eosinophils Relative: 0.6 % (ref 0.0–5.0)
HEMATOCRIT: 38.5 % (ref 36.0–46.0)
Hemoglobin: 12.5 g/dL (ref 12.0–15.0)
LYMPHS ABS: 2.6 10*3/uL (ref 0.7–4.0)
Lymphocytes Relative: 36 % (ref 12.0–46.0)
MCHC: 32.4 g/dL (ref 30.0–36.0)
MCV: 83.3 fl (ref 78.0–100.0)
MONO ABS: 0.6 10*3/uL (ref 0.1–1.0)
Monocytes Relative: 8.3 % (ref 3.0–12.0)
Neutro Abs: 3.9 10*3/uL (ref 1.4–7.7)
Neutrophils Relative %: 54.7 % (ref 43.0–77.0)
PLATELETS: 396 10*3/uL (ref 150.0–400.0)
RBC: 4.63 Mil/uL (ref 3.87–5.11)
RDW: 18.9 % — ABNORMAL HIGH (ref 11.5–15.5)
WBC: 7.1 10*3/uL (ref 4.0–10.5)

## 2013-12-20 LAB — COMPREHENSIVE METABOLIC PANEL
ALK PHOS: 48 U/L (ref 39–117)
ALT: 18 U/L (ref 0–35)
AST: 17 U/L (ref 0–37)
Albumin: 3.8 g/dL (ref 3.5–5.2)
BUN: 9 mg/dL (ref 6–23)
CO2: 28 mEq/L (ref 19–32)
Calcium: 9.3 mg/dL (ref 8.4–10.5)
Chloride: 106 mEq/L (ref 96–112)
Creatinine, Ser: 0.7 mg/dL (ref 0.4–1.2)
GFR: 129.12 mL/min (ref >60.00)
Glucose, Bld: 77 mg/dL (ref 70–99)
Potassium: 3.7 mEq/L (ref 3.5–5.1)
SODIUM: 139 meq/L (ref 135–145)
TOTAL PROTEIN: 7.4 g/dL (ref 6.0–8.3)
Total Bilirubin: 0.4 mg/dL (ref 0.2–1.2)

## 2013-12-20 LAB — SEDIMENTATION RATE: Sed Rate: 36 mm/hr — ABNORMAL HIGH (ref 0–22)

## 2013-12-20 MED ORDER — RANITIDINE HCL 300 MG PO CAPS: 300.0000 mg | ORAL_CAPSULE | Freq: Every evening | ORAL | Status: DC

## 2013-12-20 MED ORDER — PROMETHAZINE HCL 25 MG PO TABS: ORAL_TABLET | ORAL | Status: DC

## 2013-12-20 MED ORDER — METRONIDAZOLE 250 MG PO TABS: 250.0000 mg | ORAL_TABLET | Freq: Two times a day (BID) | ORAL | Status: DC

## 2013-12-20 MED ORDER — BUDESONIDE 9 MG PO TB24: 1.0000 | ORAL_TABLET | Freq: Every day | ORAL | Status: DC

## 2013-12-20 NOTE — Patient Instructions (Signed)
We have sent the following medications to your pharmacy for you to pick up at your convenience: Uceris 9 mg daily x 6 weeks Ranitidine 300 mg every night Phenergan 25 mg-1/2-1 tablet by mouth every 6 hours as needed Flagyl 250 mg twice daily x 7 days  Your physician has requested that you go to the basement for the following lab work before leaving today: CBC, Sed Rate, CMET  CC:Dr [employer]

## 2013-12-20 NOTE — Progress Notes (Signed)
[name] 10-01-1990 [id number]  Note: This dictation was prepared with Dragon digital system. Any transcriptional errors that result from this procedure are unintentional.   History of Present Illness:  This is a 23 year old African American female with complicated Crohn's disease diagnosed in 2004 at age 71, initially  in the duodenum. Later on, it was demonstrated  in the terminal ileum and rectum. She has a rectovaginal fistula. She delivered by C-section in February 2015. Prior to that, she had a terminal ileal resection in 2012 and a temporary colostomy and takedown in 2012. Her last colonoscopy in 2013 showed an anastomotic ulcer at the ileo colic anastomosis.She has been on Humira 40 mg every 2 weeks, 6-MP 100 mg a day. She has a history of C. difficile colitis and B12 deficiency. Last endoscopy in April 2013 was normal. She is here today because of a 2 week history of loose stool but no blood  Her rectovaginal fistula has been stable. She is complaining of nausea but no pain.    Past Medical History  Diagnosis Date  . 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 Candida esophagitis   . Ovarian cyst, left   . Anemia     iron def  . DVT (deep venous thrombosis) 08/2010    Left IJ (postop) anticoag x 3 mo  . Small bowel obstruction 2011  . Acute renal failure   . Allergy   . Blood transfusion   . Perianal fistula   . 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 Gastroparesis   . Blood transfusion without reported diagnosis   . Perirectal abscess 04/21/2013  . PONV (postoperative nausea and vomiting)   . Hypertension 2011    resolved - after 2011 surgery, no problems  . SVT (supraventricular tachycardia) 2011    resolved, after 2011 surgery, no problem  . Anxiety 2011    resolved - after 2011 surgery, no problem  . Depression 2011    resolved - after 2011 surgery, no problem  . History of blood transfusion 2011     [place]  2 units transfuses  . MRSA (methicillin resistant Staphylococcus aureus) 04/2010    multiple abscesses    Past Surgical History  Procedure Laterality Date  . Cholecystectomy  2009  . Closure of ileostomy/loop  08/2010  . Colectomy with diverting loop ileostomy  2011  . Appendectomy    . Esophagogastroduodenoscopy  11/11/2011    Procedure: ESOPHAGOGASTRODUODENOSCOPY (EGD);  Surgeon: Lafayette Dragon, MD;  Location: [facility];  Service: Endoscopy;  Laterality: N/A;  . Examination under anesthesia  11/12/2011    Procedure: EXAM UNDER ANESTHESIA;  Surgeon: Rolm Bookbinder, MD;  Location: [facility];  Service: General;  Laterality: N/A;  . Proctoscopy  11/12/2011    Procedure: PROCTOSCOPY;  Surgeon: Rolm Bookbinder, MD;  Location: [facility];  Service: General;  Laterality: N/A;  . Incision and drainage perirectal abscess  11/12/2011    Procedure: IRRIGATION AND DEBRIDEMENT PERIRECTAL ABSCESS;  Surgeon: Rolm Bookbinder, MD;  Location: [facility];  Service: General;  Laterality: N/A;  . Incision and drainage perirectal abscess  12/11/2011    Procedure: IRRIGATION AND DEBRIDEMENT PERIRECTAL ABSCESS;  Surgeon: [name], MD;  Location: [facility];  Service: General;  Laterality: N/A;  . Examination under anesthesia N/A 04/01/2013    Procedure: Jasmine December UNDER ANESTHESIA;  Surgeon: Stark Klein, MD;  Location: [facility];  Service: General;  Laterality: N/A;  .  Incision and drainage perirectal abscess N/A 04/01/2013    Procedure: IRRIGATION AND DEBRIDEMENT PERIRECTAL ABSCESS;  Surgeon: Stark Klein, MD;  Location: [facility];  Service: General;  Laterality: N/A;  . Colonoscopy  2013  . Cesarean section N/A 08/16/2013    Procedure: Primary CESAREAN SECTION;  Surgeon: Betsy Coder, MD;  Location: [facility];  Service: Obstetrics;  Laterality: N/A;    Allergies  Allergen Reactions  . Iron Dextran Anaphylaxis    Family history and social history have been reviewed.  Review of Systems: Positive for nausea but  no vomiting  The remainder of the 10 point ROS is negative except as outlined in the H&P  Physical Exam: General Appearance Well developed, in no distress Eyes  Non icteric  HEENT  Non traumatic, normocephalic  Mouth No lesion, tongue papillated, no cheilosis Neck Supple without adenopathy, thyroid not enlarged, no carotid bruits, no JVD Lungs Clear to auscultation bilaterally COR Normal S1, normal S2, regular rhythm, no murmur, quiet precordium Abdomen tender in epigastrium and across the upper abdomen. Normal in right lower quadrant and left lower quadrant. No distention. Well-healed surgical scars Rectal somewhat tender digital exam. Scarring and fibrotic tissue in the anal canal but no active inflammation or drainage. Stool is heme positive Extremities  No pedal edema Skin No lesions Neurological Alert and oriented x 3 Psychological Normal mood and affect  Assessment and Plan:   Problem #1 Crohn's disease. On complex medical therapy. She has done very well since her C-section in February 2015 but she has started to have diarrhea. It could be due to bacterial overgrowth or due to an exacerbation of colitis. Her nausea may be related to gastroesophageal reflux or gastritis. We will start her on ranitidine 300 mg a day, Flagyl 250 mg twice a day for one week and Uceris 9 mg daily for 6 weeks. We will be checking sedimentation rate, CBC and metabolic panel. She also needs a refill on Phenergan 25 mg, to take 1/2-1 tablet every 6 hours when necessary. I will see her in 8 weeks.    Lafayette Dragon 12/20/2013

## 2013-12-28 ENCOUNTER — Telehealth: Payer: Self-pay | Admitting: Internal Medicine

## 2013-12-28 MED ORDER — PROMETHAZINE HCL 25 MG PO TABS: ORAL_TABLET | ORAL | Status: DC

## 2013-12-28 NOTE — Telephone Encounter (Signed)
Patient calling to report diarrhea went away for 2 days then came back. She did not start her Uceris yet because it had to be precerted. She is getting it today. She is also having nausea that is not relieved by Phenergan. States she is not pregnant. Has no appetite. She denies cramping. She is bleeding from hemorrhoids. Please, advise.

## 2013-12-28 NOTE — Telephone Encounter (Signed)
Spoke with patient and gave her Dr. Nichola Sizer recommendations. Rx sent to pharmacy.

## 2013-12-28 NOTE — Telephone Encounter (Signed)
First , she needs to be taking Uceris before anything gets better. OK to refill Phenergan 25 mg, #30 q 4 hrs prn, 1 refill.

## 2013-12-28 NOTE — Telephone Encounter (Signed)
Left a message for patient to call back. 

## 2014-03-07 ENCOUNTER — Other Ambulatory Visit (INDEPENDENT_AMBULATORY_CARE_PROVIDER_SITE_OTHER): Payer: Medicaid Other

## 2014-03-07 ENCOUNTER — Ambulatory Visit (INDEPENDENT_AMBULATORY_CARE_PROVIDER_SITE_OTHER): Payer: Medicaid Other | Admitting: Internal Medicine

## 2014-03-07 ENCOUNTER — Encounter: Payer: Self-pay | Admitting: Internal Medicine

## 2014-03-07 VITALS — BP 116/78 | HR 80 | Ht 64.0 in | Wt 229.5 lb

## 2014-03-07 DIAGNOSIS — Z111 Encounter for screening for respiratory tuberculosis: Secondary | ICD-10-CM

## 2014-03-07 DIAGNOSIS — K603 Anal fistula, unspecified: Secondary | ICD-10-CM

## 2014-03-07 DIAGNOSIS — D509 Iron deficiency anemia, unspecified: Secondary | ICD-10-CM

## 2014-03-07 DIAGNOSIS — Z79899 Other long term (current) drug therapy: Secondary | ICD-10-CM

## 2014-03-07 DIAGNOSIS — D849 Immunodeficiency, unspecified: Secondary | ICD-10-CM

## 2014-03-07 DIAGNOSIS — K5090 Crohn's disease, unspecified, without complications: Secondary | ICD-10-CM

## 2014-03-07 LAB — CBC WITH DIFFERENTIAL/PLATELET
BASOS PCT: 0.4 % (ref 0.0–3.0)
Basophils Absolute: 0 10*3/uL (ref 0.0–0.1)
EOS PCT: 0.7 % (ref 0.0–5.0)
Eosinophils Absolute: 0 10*3/uL (ref 0.0–0.7)
HCT: 38.8 % (ref 36.0–46.0)
Hemoglobin: 12.6 g/dL (ref 12.0–15.0)
Lymphocytes Relative: 33.4 % (ref 12.0–46.0)
Lymphs Abs: 2.1 10*3/uL (ref 0.7–4.0)
MCHC: 32.3 g/dL (ref 30.0–36.0)
MCV: 85.9 fl (ref 78.0–100.0)
Monocytes Absolute: 0.5 10*3/uL (ref 0.1–1.0)
Monocytes Relative: 7.6 % (ref 3.0–12.0)
NEUTROS PCT: 57.9 % (ref 43.0–77.0)
Neutro Abs: 3.6 10*3/uL (ref 1.4–7.7)
Platelets: 387 10*3/uL (ref 150.0–400.0)
RBC: 4.52 Mil/uL (ref 3.87–5.11)
RDW: 17.1 % — ABNORMAL HIGH (ref 11.5–15.5)
WBC: 6.3 10*3/uL (ref 4.0–10.5)

## 2014-03-07 LAB — IBC PANEL
Iron: 54 ug/dL (ref 42–145)
SATURATION RATIOS: 12.2 % — AB (ref 20.0–50.0)
Transferrin: 316.3 mg/dL (ref 212.0–360.0)

## 2014-03-07 LAB — SEDIMENTATION RATE: Sed Rate: 27 mm/hr — ABNORMAL HIGH (ref 0–22)

## 2014-03-07 MED ORDER — MOVIPREP 100 G PO SOLR: 1.0000 | Freq: Once | ORAL | Status: DC

## 2014-03-07 MED ORDER — PROMETHAZINE HCL 25 MG PO TABS: ORAL_TABLET | ORAL | Status: DC

## 2014-03-07 MED ORDER — BUDESONIDE 9 MG PO TB24: 1.0000 | ORAL_TABLET | Freq: Every day | ORAL | Status: DC

## 2014-03-07 NOTE — Progress Notes (Signed)
[name] [birth year] [id number]  Note: This dictation was prepared with Dragon digital system. Any transcriptional errors that result from this procedure are unintentional.   History of Present Illness:  This is a 23 year old [place] American female with complicated Crohn's disease diagnosed at  age 73. It was initially located in the duodenum and later found in the terminal ileum and rectum. She had a rectovaginal fistula and I & D of rectal abscess in 2013. She delivered by C-section in February 2015. She has a history of a terminal ileal resection in 2012. She also had a temporary loop ileostomy and takedown of the ileostomy in 2012. Her last colonoscopy in 2013 showed an anastomotic ulcer at the ileocolic anastomosis. She is currently on Humira 40 mg every 2 weeks, 6-MP 100 mg daily and was started on Uceris 9 mg daily on her last visit in June 2015 because of increasing diarrhea.and sed rate 36. She is having 2-3 soft stools a day and some rectal discomfort. She denies any rectal bleeding.or drainage. Her last upper endoscopy in April 2013 showed no evidence of duodenal Crohn's disease. Today, she needs a refill on Phenergan.    Past Medical History  Diagnosis Date  . 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 Candida esophagitis   . Ovarian cyst, left   . Anemia     iron def  . DVT (deep venous thrombosis) 08/2010    Left IJ (postop) anticoag x 3 mo  . Small bowel obstruction 2011  . Acute renal failure   . Allergy   . Blood transfusion   . Perianal fistula   . 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 Gastroparesis   . Blood transfusion without reported diagnosis   . Perirectal abscess 04/21/2013  . PONV (postoperative nausea and vomiting)   . Hypertension 2011    resolved - after 2011 surgery, no problems  . SVT (supraventricular tachycardia) 2011    resolved, after 2011 surgery, no problem  . Anxiety 2011     resolved - after 2011 surgery, no problem  . Depression 2011    resolved - after 2011 surgery, no problem  . History of blood transfusion 2011    [facility]  2 units transfuses  . MRSA (methicillin resistant Staphylococcus aureus) 04/2010    multiple abscesses    Past Surgical History  Procedure Laterality Date  . Cholecystectomy  2009  . Closure of ileostomy/loop  08/2010  . Colectomy with diverting loop ileostomy  2011  . Appendectomy    . Esophagogastroduodenoscopy  11/11/2011    Procedure: ESOPHAGOGASTRODUODENOSCOPY (EGD);  Surgeon: Lafayette Dragon, MD;  Location: [facility];  Service: Endoscopy;  Laterality: N/A;  . Examination under anesthesia  11/12/2011    Procedure: EXAM UNDER ANESTHESIA;  Surgeon: Rolm Bookbinder, MD;  Location: [facility];  Service: General;  Laterality: N/A;  . Proctoscopy  11/12/2011    Procedure: PROCTOSCOPY;  Surgeon: Rolm Bookbinder, MD;  Location: [facility];  Service: General;  Laterality: N/A;  . Incision and drainage perirectal abscess  11/12/2011    Procedure: IRRIGATION AND DEBRIDEMENT PERIRECTAL ABSCESS;  Surgeon: Rolm Bookbinder, MD;  Location: [facility];  Service: General;  Laterality: N/A;  . Incision and drainage perirectal abscess  12/11/2011    Procedure: IRRIGATION AND DEBRIDEMENT PERIRECTAL ABSCESS;  Surgeon: [name], MD;  Location: [facility];  Service: General;  Laterality: N/A;  . Examination under anesthesia  N/A 04/01/2013    Procedure: EXAM UNDER ANESTHESIA;  Surgeon: Stark Klein, MD;  Location: [facility];  Service: General;  Laterality: N/A;  . Incision and drainage perirectal abscess N/A 04/01/2013    Procedure: IRRIGATION AND DEBRIDEMENT PERIRECTAL ABSCESS;  Surgeon: Stark Klein, MD;  Location: [facility];  Service: General;  Laterality: N/A;  . Colonoscopy  2013  . Cesarean section N/A 08/16/2013    Procedure: Primary CESAREAN SECTION;  Surgeon: Betsy Coder, MD;  Location: [facility];  Service: Obstetrics;  Laterality: N/A;    Allergies   Allergen Reactions  . Iron Dextran Anaphylaxis    Family history and social history have been reviewed.  Review of Systems: Occasional loose stools. Crampy abdominal pain and rectal pain  The remainder of the 10 point ROS is negative except as outlined in the H&P  Physical Exam: General Appearance Well developed, in no distress, overweight Eyes  Non icteric  HEENT  Non traumatic, normocephalic  Mouth No lesion, tongue papillated, no cheilosis Neck Supple without adenopathy, thyroid not enlarged, no carotid bruits, no JVD Lungs Clear to auscultation bilaterally COR Normal S1, normal S2, regular rhythm, no murmur, quiet precordium Abdomen obese soft with multiple healed scars. Tenderness in all quadrants, mostly left lower quadrant and right middle quadrant. No rebound. No palpable mass Rectal no drainage around the perianal area. Well healed rectal abscess and fistula. Tender anal canal. Small amount of blood on the glove. Hemoccult-positive. Extremities  No pedal edema Skin No lesions Neurological Alert and oriented x 3 Psychological Normal mood and affect  Assessment and Plan:   Problem #43 23 year old [place] American female with complex Crohn's disease. She is now 6 months post C-section delivery. She is improved since we started her on Uceris 9 mg daily and we will plan to keep her on Uceris for additional 2 full months.I think she has an active disease. We will proceed with a colonoscopy to assess the activity of the disease. Based on the colonoscopy, we may change her Humira to 40 mg weekly. We will check a sedimentation rate, CBC and iron studies today. She is allergic to iron dextran.    [name] 03/07/2014

## 2014-03-07 NOTE — Patient Instructions (Addendum)
We have given you a TB skin test today. Please make certain to come back to the office for a reading between 48-72 hours from now to avoid requiring repeat testing.  You have been scheduled for a colonoscopy. Please follow written instructions given to you at your visit today.  Please pick up your prep kit at the pharmacy within the next 1-3 days. If you use inhalers (even only as needed), please bring them with you on the day of your procedure. Your physician has requested that you go to www.startemmi.com and enter the access code given to you at your visit today. This web site gives a general overview about your procedure. However, you should still follow specific instructions given to you by our office regarding your preparation for the procedure.  We have sent the following medications to your pharmacy for you to pick up at your convenience: Phenergan, [facility] has requested that you go to the basement for the following lab work before leaving today: CBC, IBC, Sed Rate  CC: Dr Asa Lente

## 2014-03-09 LAB — TB SKIN TEST: TB Skin Test: NEGATIVE

## 2014-03-13 ENCOUNTER — Telehealth: Payer: Self-pay | Admitting: Internal Medicine

## 2014-03-13 NOTE — Telephone Encounter (Signed)
Left a message for patient to call back. 

## 2014-03-14 NOTE — Telephone Encounter (Signed)
Spoke with patient and reviewed labs from [place]. Labs are stable or better than last OV.

## 2014-03-27 ENCOUNTER — Ambulatory Visit (AMBULATORY_SURGERY_CENTER): Payer: Medicaid Other | Admitting: Internal Medicine

## 2014-03-27 ENCOUNTER — Encounter: Payer: Self-pay | Admitting: *Deleted

## 2014-03-27 ENCOUNTER — Encounter: Payer: Self-pay | Admitting: Internal Medicine

## 2014-03-27 VITALS — BP 166/79 | HR 58 | Temp 98.0°F | Resp 16 | Ht 64.0 in | Wt 229.0 lb

## 2014-03-27 DIAGNOSIS — D1330 Benign neoplasm of unspecified part of small intestine: Secondary | ICD-10-CM

## 2014-03-27 DIAGNOSIS — K612 Anorectal abscess: Secondary | ICD-10-CM

## 2014-03-27 DIAGNOSIS — D126 Benign neoplasm of colon, unspecified: Secondary | ICD-10-CM

## 2014-03-27 DIAGNOSIS — K5090 Crohn's disease, unspecified, without complications: Secondary | ICD-10-CM

## 2014-03-27 MED ORDER — SODIUM CHLORIDE 0.9 % IV SOLN: 500.0000 mL | INTRAVENOUS | Status: DC

## 2014-03-27 NOTE — Progress Notes (Signed)
A/ox3 pleased with MAC, report to Karen RN 

## 2014-03-27 NOTE — Op Note (Signed)
[facility]  [address] [place], [id number]   COLONOSCOPY PROCEDURE REPORT  PATIENT: [name], [name]  MR#: [id number] BIRTHDATE: 09-07-1990 , 23  yrs. old GENDER: Female ENDOSCOPIST: Lafayette Dragon, MD REFERRED LN:[id number] Asa Lente, M.D. PROCEDURE DATE:  03/27/2014 PROCEDURE:   Colonoscopy with biopsy First Screening Colonoscopy - Avg.  risk and is 50 yrs.  old or older - No.  Prior Negative Screening - Now for repeat screening. N/A  History of Adenoma - Now for follow-up colonoscopy & has been > or = to 3 yrs.  N/A  Polyps Removed Today? No.  Recommend repeat exam, <10 yrs? Yes.  No reason given. ASA CLASS:   Class III INDICATIONS:prior colonoscopy in 2013 show anastomotic ulcer. Complicated Crohn's disease of the rectum colon and small bowel. Post terminal ileal resection and ileocolic anastomosis.  Patient delivered by C-section in 2015.  Currently on Humira, 6-MP and Uceris.Marland Kitchen MEDICATIONS: MAC sedation, administered by CRNA and propofol (Diprivan) 254m IV  DESCRIPTION OF PROCEDURE:   After the risks benefits and alternatives of the procedure were thoroughly explained, informed consent was obtained.  A digital rectal exam revealed no abnormalities of the rectum.   The LB 1528  endoscope was introduced through the anus and advanced to the surgical anastomosis. No adverse events experienced.   The quality of the prep was good, using MoviPrep  The instrument was then slowly withdrawn as the colon was fully examined.      COLON FINDINGS: There was evidence of a prior end-to-end ileocolonic surgical anastomosis in the terminal ileum. terminal ileum was entered and showed normal mucosa. Biopsies were taken from the ileum. The anastomosis was widely patent. There were no ulcerations. Biopsies were taken from the suture line. There was no stricture Multiple biopsies were performed. from the right transverse and left colon to rule out granulomatous  colitis Retroflexed views revealed no abnormalities. The time to cecum=3 minutes 57 seconds.  Withdrawal time=6 minutes 10 seconds.  The scope was withdrawn and the procedure completed. COMPLICATIONS: There were no complications.  ENDOSCOPIC IMPRESSION: There was evidence of a prior ileocolonic surgical anastomosis in the terminal ileum; multiple biopsies were performed , from the terminal ileum anastomosis, and colon Healed rectal abscess and rectovaginal fistula No evidence of active Crohn's disease  RECOMMENDATIONS: 1.  Await pathology results 2.  continue Humira 40 mg, 6-MP and Uceris as prescribed Followup office visit Recall colonoscopy in 5 years   eSigned:  DLafayette Dragon MD 03/27/2014 3:02 PM   cc:

## 2014-03-27 NOTE — Patient Instructions (Signed)
YOU HAD AN ENDOSCOPIC PROCEDURE TODAY AT THE [facility]: Refer to the procedure report that was given to you for any specific questions about what was found during the examination.  If the procedure report does not answer your questions, please call your gastroenterologist to clarify.  If you requested that your care partner not be given the details of your procedure findings, then the procedure report has been included in a sealed envelope for you to review at your convenience later.  YOU SHOULD EXPECT: Some feelings of bloating in the abdomen. Passage of more gas than usual.  Walking can help get rid of the air that was put into your GI tract during the procedure and reduce the bloating. If you had a lower endoscopy (such as a colonoscopy or flexible sigmoidoscopy) you may notice spotting of blood in your stool or on the toilet paper. If you underwent a bowel prep for your procedure, then you may not have a normal bowel movement for a few days.  DIET: Your first meal following the procedure should be a light meal and then it is ok to progress to your normal diet.  A half-sandwich or bowl of soup is an example of a good first meal.  Heavy or fried foods are harder to digest and may make you feel nauseous or bloated.  Likewise meals heavy in dairy and vegetables can cause extra gas to form and this can also increase the bloating.  Drink plenty of fluids but you should avoid alcoholic beverages for 24 hours.  ACTIVITY: Your care partner should take you home directly after the procedure.  You should plan to take it easy, moving slowly for the rest of the day.  You can resume normal activity the day after the procedure however you should NOT DRIVE or use heavy machinery for 24 hours (because of the sedation medicines used during the test).    SYMPTOMS TO REPORT IMMEDIATELY: A gastroenterologist can be reached at any hour.  During normal business hours, 8:30 AM to 5:00 PM Monday through Friday,  call [phone].  After hours and on weekends, please call the GI answering service at [phone] who will take a message and have the physician on call contact you.   Following lower endoscopy (colonoscopy or flexible sigmoidoscopy):  Excessive amounts of blood in the stool  Significant tenderness or worsening of abdominal pains  Swelling of the abdomen that is new, acute  Fever of 100F or higher    FOLLOW UP: If any biopsies were taken you will be contacted by phone or by letter within the next 1-3 weeks.  Call your gastroenterologist if you have not heard about the biopsies in 3 weeks.  Our staff will call the home number listed on your records the next business day following your procedure to check on you and address any questions or concerns that you may have at that time regarding the information given to you following your procedure. This is a courtesy call and so if there is no answer at the home number and we have not heard from you through the emergency physician on call, we will assume that you have returned to your regular daily activities without incident.  SIGNATURES/CONFIDENTIALITY: You and/or your care partner have signed paperwork which will be entered into your electronic medical record.  These signatures attest to the fact that that the information above on your After Visit Summary has been reviewed and is understood.  Full responsibility of the confidentiality   of this discharge information lies with you and/or your care-partner.     

## 2014-03-27 NOTE — Progress Notes (Signed)
Called to room to assist during endoscopic procedure.  Patient ID and intended procedure confirmed with present staff. Received instructions for my participation in the procedure from the performing physician.  

## 2014-03-28 ENCOUNTER — Telehealth: Payer: Self-pay | Admitting: *Deleted

## 2014-03-28 NOTE — Telephone Encounter (Signed)
Number identifier, left message, follow-up

## 2014-04-03 ENCOUNTER — Encounter: Payer: Self-pay | Admitting: Internal Medicine

## 2014-04-04 ENCOUNTER — Encounter: Payer: Self-pay | Admitting: *Deleted

## 2014-05-02 ENCOUNTER — Encounter: Payer: Self-pay | Admitting: Internal Medicine

## 2014-05-02 ENCOUNTER — Ambulatory Visit (INDEPENDENT_AMBULATORY_CARE_PROVIDER_SITE_OTHER): Payer: Medicaid Other | Admitting: Internal Medicine

## 2014-05-02 VITALS — BP 118/70 | HR 64 | Ht 64.0 in | Wt 231.1 lb

## 2014-05-02 DIAGNOSIS — D849 Immunodeficiency, unspecified: Secondary | ICD-10-CM

## 2014-05-02 DIAGNOSIS — R197 Diarrhea, unspecified: Secondary | ICD-10-CM

## 2014-05-02 DIAGNOSIS — E538 Deficiency of other specified B group vitamins: Secondary | ICD-10-CM

## 2014-05-02 DIAGNOSIS — K612 Anorectal abscess: Secondary | ICD-10-CM

## 2014-05-02 DIAGNOSIS — K50113 Crohn's disease of large intestine with fistula: Secondary | ICD-10-CM

## 2014-05-02 DIAGNOSIS — D899 Disorder involving the immune mechanism, unspecified: Secondary | ICD-10-CM

## 2014-05-02 MED ORDER — ZOLPIDEM TARTRATE 5 MG PO TABS: 5.0000 mg | ORAL_TABLET | Freq: Every evening | ORAL | Status: DC | PRN

## 2014-05-02 NOTE — Progress Notes (Signed)
[name] 1991/01/29 [id number]  Note: This dictation was prepared with Dragon digital system. Any transcriptional errors that result from this procedure are unintentional.   History of Present Illness: This is a 23 year old African American female with complicated Crohn's disease of the duodenum, ileum, rectum and colon. A colonoscopy in 03/27/2014 showed no evidence of active Crohn's disease. Biopsies were obtained from the terminal ileum, anastomosis and colon. Her sedimentation rate is 27 and iron saturation saturation 12%. She still uses Uceris 9 mg daily. She is complaining of night sweats and inability to sleep. She takes Humira 40 mg every 2 weeks. She completed Flagyl for diarrhea. She has no complaints today as far as her GI tract is concerned.     Past Medical History  Diagnosis Date  . 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 Candida esophagitis   . Ovarian cyst, left   . Anemia     iron def  . DVT (deep venous thrombosis) 08/2010    Left IJ (postop) anticoag x 3 mo  . Small bowel obstruction 2011  . Acute renal failure   . Allergy   . Blood transfusion   . Perianal fistula   . 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 Gastroparesis   . Blood transfusion without reported diagnosis   . Perirectal abscess 04/21/2013  . PONV (postoperative nausea and vomiting)   . Hypertension 2011    resolved - after 2011 surgery, no problems  . SVT (supraventricular tachycardia) 2011    resolved, after 2011 surgery, no problem  . Anxiety 2011    resolved - after 2011 surgery, no problem  . Depression 2011    resolved - after 2011 surgery, no problem  . History of blood transfusion 2011    [facility]  2 units transfuses  . MRSA (methicillin resistant Staphylococcus aureus) 04/2010    multiple abscesses  . GERD (gastroesophageal reflux disease)     Past Surgical History  Procedure Laterality Date  .  Cholecystectomy  2009  . Closure of ileostomy/loop  08/2010  . Colectomy with diverting loop ileostomy  2011  . Appendectomy    . Esophagogastroduodenoscopy  11/11/2011    Procedure: ESOPHAGOGASTRODUODENOSCOPY (EGD);  Surgeon: Lafayette Dragon, MD;  Location: [facility];  Service: Endoscopy;  Laterality: N/A;  . Examination under anesthesia  11/12/2011    Procedure: EXAM UNDER ANESTHESIA;  Surgeon: Rolm Bookbinder, MD;  Location: [facility];  Service: General;  Laterality: N/A;  . Proctoscopy  11/12/2011    Procedure: PROCTOSCOPY;  Surgeon: Rolm Bookbinder, MD;  Location: [facility];  Service: General;  Laterality: N/A;  . Incision and drainage perirectal abscess  11/12/2011    Procedure: IRRIGATION AND DEBRIDEMENT PERIRECTAL ABSCESS;  Surgeon: Rolm Bookbinder, MD;  Location: [facility];  Service: General;  Laterality: N/A;  . Incision and drainage perirectal abscess  12/11/2011    Procedure: IRRIGATION AND DEBRIDEMENT PERIRECTAL ABSCESS;  Surgeon: [name], MD;  Location: [facility];  Service: General;  Laterality: N/A;  . Examination under anesthesia N/A 04/01/2013    Procedure: Jasmine December UNDER ANESTHESIA;  Surgeon: Stark Klein, MD;  Location: [facility];  Service: General;  Laterality: N/A;  . Incision and drainage perirectal abscess N/A 04/01/2013    Procedure: IRRIGATION AND DEBRIDEMENT PERIRECTAL ABSCESS;  Surgeon: Stark Klein, MD;  Location: [facility];  Service: General;  Laterality: N/A;  . Colonoscopy  2013  . Cesarean section N/A  08/16/2013    Procedure: Primary CESAREAN SECTION;  Surgeon: Betsy Coder, MD;  Location: [facility];  Service: Obstetrics;  Laterality: N/A;    Allergies  Allergen Reactions  . Iron Dextran Anaphylaxis    Family history and social history have been reviewed.  Review of Systems: Insomnia. Night sweats  The remainder of the 10 point ROS is negative except as outlined in the H&P  Physical Exam: General Appearance Well developed, in no distress or weight Eyes  Non icteric   HEENT  Non traumatic, normocephalic  Mouth No lesion, tongue papillated, no cheilosis Neck Supple without adenopathy, thyroid not enlarged, no carotid bruits, no JVD Lungs Clear to auscultation bilaterally COR Normal S1, normal S2, regular rhythm, no murmur, quiet precordium Abdomen soft nontender normoactive bowel sounds noted to Rectal not done Extremities  No pedal edema Skin No lesions Neurological Alert and oriented x 3 Psychological Normal mood and affect  Assessment and Plan:   Problem #59 23 year old, female with no active Crohn's disease , negative colonoscopy. 4 weeks ago. She will  stop 6-MP and continue Humira 40 mg every 2 weeks. She will decrease Uceris to 9 mg every other day. I will see her in 3 months for follow up.    [name] 05/02/2014

## 2014-05-02 NOTE — Patient Instructions (Signed)
We have sent the following medications to your pharmacy for you to pick up at your convenience: Ambien 5 mg   Please take your Uceris 79m one tablet every other day. Discontinue the 6MP. Return to care in 3 months.

## 2014-05-05 ENCOUNTER — Telehealth: Payer: Self-pay | Admitting: Internal Medicine

## 2014-05-05 MED ORDER — RANITIDINE HCL 75 MG PO TABS: 75.0000 mg | ORAL_TABLET | Freq: Every day | ORAL | Status: DC

## 2014-05-05 NOTE — Telephone Encounter (Signed)
Left message for patient to call back  

## 2014-05-05 NOTE — Telephone Encounter (Signed)
Patient needs refills of ranitidine. Rx sent to pharmacy. Patient also states that she will not be taking the Ambien recently rx'ed for her.  She states it did not help her sleep and has made her sweat profusely.

## 2014-05-08 ENCOUNTER — Telehealth: Payer: Self-pay | Admitting: Internal Medicine

## 2014-05-08 NOTE — Telephone Encounter (Signed)
Pt states she did not sleep at all over the weekend taking the med that was prescribed for sleep. Also states she tried some OTC meds for sleep and they did not help her either, just made her sweat. Pt states she would like to see a physician to help her with this and needs a referral but does not know where to start. Please advise.

## 2014-05-09 NOTE — Telephone Encounter (Signed)
Pt aware.

## 2014-05-09 NOTE — Telephone Encounter (Signed)
Her PCP will talk to her about it. ( Dr Asa Lente).

## 2014-05-15 ENCOUNTER — Encounter: Payer: Self-pay | Admitting: Internal Medicine

## 2014-09-09 ENCOUNTER — Encounter (HOSPITAL_COMMUNITY): Payer: Self-pay | Admitting: Emergency Medicine

## 2014-09-09 ENCOUNTER — Emergency Department (HOSPITAL_COMMUNITY)
Admission: EM | Admit: 2014-09-09 | Discharge: 2014-09-10 | Disposition: A | Discharge status: Home / Self Care | Payer: Medicaid Other | Attending: Emergency Medicine | Admitting: Emergency Medicine

## 2014-09-09 DIAGNOSIS — Z862 Personal history of diseases of the blood and blood-forming organs and certain disorders involving the immune mechanism: Secondary | ICD-10-CM | POA: Insufficient documentation

## 2014-09-09 DIAGNOSIS — Z8742 Personal history of other diseases of the female genital tract: Secondary | ICD-10-CM | POA: Insufficient documentation

## 2014-09-09 DIAGNOSIS — Z8614 Personal history of Methicillin resistant Staphylococcus aureus infection: Secondary | ICD-10-CM | POA: Diagnosis not present

## 2014-09-09 DIAGNOSIS — Z87448 Personal history of other diseases of urinary system: Secondary | ICD-10-CM | POA: Insufficient documentation

## 2014-09-09 DIAGNOSIS — Z9089 Acquired absence of other organs: Secondary | ICD-10-CM | POA: Insufficient documentation

## 2014-09-09 DIAGNOSIS — F419 Anxiety disorder, unspecified: Secondary | ICD-10-CM | POA: Insufficient documentation

## 2014-09-09 DIAGNOSIS — K529 Noninfective gastroenteritis and colitis, unspecified: Secondary | ICD-10-CM | POA: Diagnosis not present

## 2014-09-09 DIAGNOSIS — Z9889 Other specified postprocedural states: Secondary | ICD-10-CM | POA: Diagnosis not present

## 2014-09-09 DIAGNOSIS — Z86718 Personal history of other venous thrombosis and embolism: Secondary | ICD-10-CM | POA: Insufficient documentation

## 2014-09-09 DIAGNOSIS — R1012 Left upper quadrant pain: Secondary | ICD-10-CM | POA: Diagnosis present

## 2014-09-09 DIAGNOSIS — Z3202 Encounter for pregnancy test, result negative: Secondary | ICD-10-CM | POA: Insufficient documentation

## 2014-09-09 DIAGNOSIS — K219 Gastro-esophageal reflux disease without esophagitis: Secondary | ICD-10-CM | POA: Diagnosis not present

## 2014-09-09 DIAGNOSIS — F329 Major depressive disorder, single episode, unspecified: Secondary | ICD-10-CM | POA: Insufficient documentation

## 2014-09-09 DIAGNOSIS — Z8619 Personal history of other infectious and parasitic diseases: Secondary | ICD-10-CM | POA: Diagnosis not present

## 2014-09-09 DIAGNOSIS — I10 Essential (primary) hypertension: Secondary | ICD-10-CM | POA: Diagnosis not present

## 2014-09-09 DIAGNOSIS — Z7951 Long term (current) use of inhaled steroids: Secondary | ICD-10-CM | POA: Diagnosis not present

## 2014-09-09 LAB — BASIC METABOLIC PANEL
ANION GAP: 6 (ref 5–15)
BUN: 6 mg/dL (ref 6–23)
CO2: 24 mmol/L (ref 19–32)
Calcium: 9.1 mg/dL (ref 8.4–10.5)
Chloride: 110 mmol/L (ref 96–112)
Creatinine, Ser: 0.61 mg/dL (ref 0.50–1.10)
GFR calc Af Amer: >90 mL/min (ref >90)
GFR calc non Af Amer: >90 mL/min (ref >90)
Glucose, Bld: 96 mg/dL (ref 70–99)
POTASSIUM: 3.7 mmol/L (ref 3.5–5.1)
Sodium: 140 mmol/L (ref 135–145)

## 2014-09-09 LAB — CBC WITH DIFFERENTIAL/PLATELET
Basophils Absolute: 0 10*3/uL (ref 0.0–0.1)
Basophils Relative: 0 % (ref 0–1)
Eosinophils Absolute: 0 10*3/uL (ref 0.0–0.7)
Eosinophils Relative: 0 % (ref 0–5)
HCT: 40.2 % (ref 36.0–46.0)
Hemoglobin: 12.9 g/dL (ref 12.0–15.0)
LYMPHS ABS: 1.7 10*3/uL (ref 0.7–4.0)
LYMPHS PCT: 16 % (ref 12–46)
MCH: 27.4 pg (ref 26.0–34.0)
MCHC: 32.1 g/dL (ref 30.0–36.0)
MCV: 85.5 fL (ref 78.0–100.0)
Monocytes Absolute: 0.5 10*3/uL (ref 0.1–1.0)
Monocytes Relative: 4 % (ref 3–12)
Neutro Abs: 8.5 10*3/uL — ABNORMAL HIGH (ref 1.7–7.7)
Neutrophils Relative %: 80 % — ABNORMAL HIGH (ref 43–77)
Platelets: 404 10*3/uL — ABNORMAL HIGH (ref 150–400)
RBC: 4.7 MIL/uL (ref 3.87–5.11)
RDW: 15.8 % — ABNORMAL HIGH (ref 11.5–15.5)
WBC: 10.7 10*3/uL — ABNORMAL HIGH (ref 4.0–10.5)

## 2014-09-09 MED ORDER — ONDANSETRON 4 MG PO TBDP
4.0000 mg | ORAL_TABLET | Freq: Once | ORAL | Status: AC
  Administered 2014-09-09: 4 mg via ORAL
  Filled 2014-09-09: qty 1

## 2014-09-09 NOTE — ED Notes (Addendum)
Patient c/o L abd pain that radiates to her L flank, states she has a hx of chron's dx and SBO. Patient also reports vaginal spotting, and nausea with vomiting. Patient states "I don't think I am pregnant, I am on the pill", when reviewing sexual history patient states she has implanon. Patient also reports black tarry stools, that started a few days ago. Patient sees Dr Maurene Capes for GI.

## 2014-09-10 ENCOUNTER — Emergency Department (HOSPITAL_COMMUNITY): Payer: Medicaid Other

## 2014-09-10 LAB — URINE MICROSCOPIC-ADD ON

## 2014-09-10 LAB — URINALYSIS, ROUTINE W REFLEX MICROSCOPIC
BILIRUBIN URINE: NEGATIVE
Glucose, UA: NEGATIVE mg/dL
KETONES UR: NEGATIVE mg/dL
Leukocytes, UA: NEGATIVE
Nitrite: NEGATIVE
PH: 6.5 (ref 5.0–8.0)
Protein, ur: NEGATIVE mg/dL
Specific Gravity, Urine: 1.012 (ref 1.005–1.030)
Urobilinogen, UA: 0.2 mg/dL (ref 0.0–1.0)

## 2014-09-10 LAB — POC URINE PREG, ED: Preg Test, Ur: NEGATIVE

## 2014-09-10 MED ORDER — PROMETHAZINE HCL 25 MG/ML IJ SOLN
12.5000 mg | Freq: Once | INTRAMUSCULAR | Status: AC
  Administered 2014-09-10: 12.5 mg via INTRAVENOUS
  Filled 2014-09-10: qty 1

## 2014-09-10 MED ORDER — IOHEXOL 300 MG/ML  SOLN
100.0000 mL | Freq: Once | INTRAMUSCULAR | Status: AC | PRN
  Administered 2014-09-10: 100 mL via INTRAVENOUS

## 2014-09-10 MED ORDER — HYDROMORPHONE HCL 1 MG/ML IJ SOLN
1.0000 mg | Freq: Once | INTRAMUSCULAR | Status: AC
  Administered 2014-09-10: 1 mg via INTRAVENOUS
  Filled 2014-09-10: qty 1

## 2014-09-10 MED ORDER — ONDANSETRON HCL 4 MG/2ML IJ SOLN
4.0000 mg | Freq: Once | INTRAMUSCULAR | Status: AC
  Administered 2014-09-10: 4 mg via INTRAVENOUS
  Filled 2014-09-10: qty 2

## 2014-09-10 MED ORDER — PROMETHAZINE HCL 25 MG PO TABS: 25.0000 mg | ORAL_TABLET | Freq: Four times a day (QID) | ORAL | Status: DC | PRN

## 2014-09-10 MED ORDER — IOHEXOL 300 MG/ML  SOLN
50.0000 mL | Freq: Once | INTRAMUSCULAR | Status: AC | PRN
  Administered 2014-09-10: 50 mL via ORAL

## 2014-09-10 NOTE — ED Notes (Signed)
abd pain, 10/10 in lower abd,  She reports a hx of chrons and sts has been having diarrhea and n/v all day

## 2014-09-10 NOTE — Discharge Instructions (Signed)
Food Choices to Help Relieve Diarrhea When you have diarrhea, the foods you eat and your eating habits are very important. Choosing the right foods and drinks can help relieve diarrhea. Also, because diarrhea can last up to 7 days, you need to replace lost fluids and electrolytes (such as sodium, potassium, and chloride) in order to help prevent dehydration.  WHAT GENERAL GUIDELINES DO I NEED TO FOLLOW?  Slowly drink 1 cup (8 oz) of fluid for each episode of diarrhea. If you are getting enough fluid, your urine will be clear or pale yellow.  Eat starchy foods. Some good choices include white rice, white toast, pasta, low-fiber cereal, baked potatoes (without the skin), saltine crackers, and bagels.  Avoid large servings of any cooked vegetables.  Limit fruit to two servings per day. A serving is  cup or 1 small piece.  Choose foods with less than 2 g of fiber per serving.  Limit fats to less than 8 tsp (38 g) per day.  Avoid fried foods.  Eat foods that have probiotics in them. Probiotics can be found in certain dairy products.  Avoid foods and beverages that may increase the speed at which food moves through the stomach and intestines (gastrointestinal tract). Things to avoid include:  High-fiber foods, such as dried fruit, raw fruits and vegetables, nuts, seeds, and whole grain foods.  Spicy foods and high-fat foods.  Foods and beverages sweetened with high-fructose corn syrup, honey, or sugar alcohols such as xylitol, sorbitol, and mannitol. WHAT FOODS ARE RECOMMENDED? Grains White rice. White, [place], or pita breads (fresh or toasted), including plain rolls, buns, or bagels. White pasta. Saltine, soda, or graham crackers. Pretzels. Low-fiber cereal. Cooked cereals made with water (such as cornmeal, farina, or cream cereals). Plain muffins. Matzo. Melba toast. Zwieback.  Vegetables Potatoes (without the skin). Strained tomato and vegetable juices. Most well-cooked and canned  vegetables without seeds. Tender lettuce. Fruits Cooked or canned applesauce, apricots, cherries, fruit cocktail, grapefruit, peaches, pears, or plums. Fresh bananas, apples without skin, cherries, grapes, cantaloupe, grapefruit, peaches, oranges, or plums.  Meat and Other Protein Products Baked or boiled chicken. Eggs. Tofu. Fish. Seafood. Smooth peanut butter. Ground or well-cooked tender beef, ham, veal, lamb, pork, or poultry.  Dairy Plain yogurt, kefir, and unsweetened liquid yogurt. Lactose-free milk, buttermilk, or soy milk. Plain hard cheese. Beverages Sport drinks. Clear broths. Diluted fruit juices (except prune). Regular, caffeine-free sodas such as ginger ale. Water. Decaffeinated teas. Oral rehydration solutions. Sugar-free beverages not sweetened with sugar alcohols. Other Bouillon, broth, or soups made from recommended foods.  The items listed above may not be a complete list of recommended foods or beverages. Contact your dietitian for more options. WHAT FOODS ARE NOT RECOMMENDED? Grains Whole grain, whole wheat, bran, or rye breads, rolls, pastas, crackers, and cereals. Wild or brown rice. Cereals that contain more than 2 g of fiber per serving. Corn tortillas or taco shells. Cooked or dry oatmeal. Granola. Popcorn. Vegetables Raw vegetables. Cabbage, broccoli, Brussels sprouts, artichokes, baked beans, beet greens, corn, kale, legumes, peas, sweet potatoes, and yams. Potato skins. Cooked spinach and cabbage. Fruits Dried fruit, including raisins and dates. Raw fruits. Stewed or dried prunes. Fresh apples with skin, apricots, mangoes, pears, raspberries, and strawberries.  Meat and Other Protein Products Chunky peanut butter. Nuts and seeds. Beans and lentils. Berniece Salines.  Dairy High-fat cheeses. Milk, chocolate milk, and beverages made with milk, such as milk shakes. Cream. Ice cream. Sweets and Desserts Sweet rolls, doughnuts, and sweet breads. Pancakes  and waffles. Fats and  Oils Butter. Cream sauces. Margarine. Salad oils. Plain salad dressings. Olives. Avocados.  Beverages Caffeinated beverages (such as coffee, tea, soda, or energy drinks). Alcoholic beverages. Fruit juices with pulp. Prune juice. Soft drinks sweetened with high-fructose corn syrup or sugar alcohols. Other Coconut. Hot sauce. Chili powder. Mayonnaise. Gravy. Cream-based or milk-based soups.  The items listed above may not be a complete list of foods and beverages to avoid. Contact your dietitian for more information. WHAT SHOULD I DO IF I BECOME DEHYDRATED? Diarrhea can sometimes lead to dehydration. Signs of dehydration include dark urine and dry mouth and skin. If you think you are dehydrated, you should rehydrate with an oral rehydration solution. These solutions can be purchased at pharmacies, retail stores, or online.  Drink -1 cup (120-240 mL) of oral rehydration solution each time you have an episode of diarrhea. If drinking this amount makes your diarrhea worse, try drinking smaller amounts more often. For example, drink 1-3 tsp (5-15 mL) every 5-10 minutes.  A general rule for staying hydrated is to drink 1-2 L of fluid per day. Talk to your health care provider about the specific amount you should be drinking each day. Drink enough fluids to keep your urine clear or pale yellow. Document Released: 09/20/2003 Document Revised: 07/05/2013 Document Reviewed: 05/23/2013 [facility] Patient Information 2015 [place], [place]. This information is not intended to replace advice given to you by your health care provider. Make sure you discuss any questions you have with your health care provider. Viral Gastroenteritis Viral gastroenteritis is also known as stomach flu. This condition affects the stomach and intestinal tract. It can cause sudden diarrhea and vomiting. The illness typically lasts 3 to 8 days. Most people develop an immune response that eventually gets rid of the virus. While this natural  response develops, the virus can make you quite ill. CAUSES  Many different viruses can cause gastroenteritis, such as rotavirus or noroviruses. You can catch one of these viruses by consuming contaminated food or water. You may also catch a virus by sharing utensils or other personal items with an infected person or by touching a contaminated surface. SYMPTOMS  The most common symptoms are diarrhea and vomiting. These problems can cause a severe loss of body fluids (dehydration) and a body salt (electrolyte) imbalance. Other symptoms may include:  Fever.  Headache.  Fatigue.  Abdominal pain. DIAGNOSIS  Your caregiver can usually diagnose viral gastroenteritis based on your symptoms and a physical exam. A stool sample may also be taken to test for the presence of viruses or other infections. TREATMENT  This illness typically goes away on its own. Treatments are aimed at rehydration. The most serious cases of viral gastroenteritis involve vomiting so severely that you are not able to keep fluids down. In these cases, fluids must be given through an intravenous line (IV). HOME CARE INSTRUCTIONS   Drink enough fluids to keep your urine clear or pale yellow. Drink small amounts of fluids frequently and increase the amounts as tolerated.  Ask your caregiver for specific rehydration instructions.  Avoid:  Foods high in sugar.  Alcohol.  Carbonated drinks.  Tobacco.  Juice.  Caffeine drinks.  Extremely hot or cold fluids.  Fatty, greasy foods.  Too much intake of anything at one time.  Dairy products until 24 to 48 hours after diarrhea stops.  You may consume probiotics. Probiotics are active cultures of beneficial bacteria. They may lessen the amount and number of diarrheal stools in adults. Probiotics  can be found in yogurt with active cultures and in supplements.  Wash your hands well to avoid spreading the virus.  Only take over-the-counter or prescription medicines for  pain, discomfort, or fever as directed by your caregiver. Do not give aspirin to children. Antidiarrheal medicines are not recommended.  Ask your caregiver if you should continue to take your regular prescribed and over-the-counter medicines.  Keep all follow-up appointments as directed by your caregiver. SEEK IMMEDIATE MEDICAL CARE IF:   You are unable to keep fluids down.  You do not urinate at least once every 6 to 8 hours.  You develop shortness of breath.  You notice blood in your stool or vomit. This may look like coffee grounds.  You have abdominal pain that increases or is concentrated in one small area (localized).  You have persistent vomiting or diarrhea.  You have a fever.  The patient is a child younger than 3 months, and he or she has a fever.  The patient is a child older than 3 months, and he or she has a fever and persistent symptoms.  The patient is a child older than 3 months, and he or she has a fever and symptoms suddenly get worse.  The patient is a baby, and he or she has no tears when crying. MAKE SURE YOU:   Understand these instructions.  Will watch your condition.  Will get help right away if you are not doing well or get worse. Document Released: 06/30/2005 Document Revised: 09/22/2011 Document Reviewed: 04/16/2011 [facility] Patient Information 2015 [place], [place]. This information is not intended to replace advice given to you by your health care provider. Make sure you discuss any questions you have with your health care provider.

## 2014-09-10 NOTE — ED Provider Notes (Signed)
CSN: [id number]     Arrival date & time 09/09/14  2221 History   First MD Initiated Contact with Patient 09/10/14 0031     Chief Complaint  Patient presents with  . Abdominal Pain    "dark tarry stools"     (Consider location/radiation/quality/duration/timing/severity/associated sxs/prior Treatment) Patient is a 24 y.o. female presenting with abdominal pain. The history is provided by the patient. No language interpreter was used.  Abdominal Pain Pain location:  LUQ Associated symptoms: diarrhea, nausea and vomiting   Associated symptoms: no chills and no fever   Associated symptoms comment:  LUQ abdominal pain for the past 2-3 days associated with vomiting and diarrhea. No fever. She has a history complicated by Crohn's disease with previous bowel resection, colostomy reversal 4 months later and history of SBO. She feels current symptoms are similar. No dysuria, chest pain or SOB.   Past Medical History  Diagnosis Date  . 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 Candida esophagitis   . Ovarian cyst, left   . Anemia     iron def  . DVT (deep venous thrombosis) 08/2010    Left IJ (postop) anticoag x 3 mo  . Small bowel obstruction 2011  . Acute renal failure   . Allergy   . Blood transfusion   . Perianal fistula   . 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 Gastroparesis   . Blood transfusion without reported diagnosis   . Perirectal abscess 04/21/2013  . PONV (postoperative nausea and vomiting)   . Hypertension 2011    resolved - after 2011 surgery, no problems  . SVT (supraventricular tachycardia) 2011    resolved, after 2011 surgery, no problem  . Anxiety 2011    resolved - after 2011 surgery, no problem  . Depression 2011    resolved - after 2011 surgery, no problem  . History of blood transfusion 2011    [place]  2 units transfuses  . MRSA (methicillin resistant Staphylococcus aureus) 04/2010     multiple abscesses  . GERD (gastroesophageal reflux disease)    Past Surgical History  Procedure Laterality Date  . Cholecystectomy  2009  . Closure of ileostomy/loop  08/2010  . Colectomy with diverting loop ileostomy  2011  . Appendectomy    . Esophagogastroduodenoscopy  11/11/2011    Procedure: ESOPHAGOGASTRODUODENOSCOPY (EGD);  Surgeon: Lafayette Dragon, MD;  Location: [facility];  Service: Endoscopy;  Laterality: N/A;  . Examination under anesthesia  11/12/2011    Procedure: EXAM UNDER ANESTHESIA;  Surgeon: Rolm Bookbinder, MD;  Location: [facility];  Service: General;  Laterality: N/A;  . Proctoscopy  11/12/2011    Procedure: PROCTOSCOPY;  Surgeon: Rolm Bookbinder, MD;  Location: [facility];  Service: General;  Laterality: N/A;  . Incision and drainage perirectal abscess  11/12/2011    Procedure: IRRIGATION AND DEBRIDEMENT PERIRECTAL ABSCESS;  Surgeon: Rolm Bookbinder, MD;  Location: [facility];  Service: General;  Laterality: N/A;  . Incision and drainage perirectal abscess  12/11/2011    Procedure: IRRIGATION AND DEBRIDEMENT PERIRECTAL ABSCESS;  Surgeon: [name], MD;  Location: [facility];  Service: General;  Laterality: N/A;  . Examination under anesthesia N/A 04/01/2013    Procedure: Jasmine December UNDER ANESTHESIA;  Surgeon: Stark Klein, MD;  Location: [facility];  Service: General;  Laterality: N/A;  . Incision and drainage perirectal abscess N/A 04/01/2013    Procedure: IRRIGATION AND DEBRIDEMENT PERIRECTAL ABSCESS;  Surgeon: Stark Klein, MD;  Location: [facility];  Service: General;  Laterality: N/A;  . Colonoscopy  2013  . Cesarean section N/A 08/16/2013    Procedure: Primary CESAREAN SECTION;  Surgeon: Betsy Coder, MD;  Location: [facility];  Service: Obstetrics;  Laterality: N/A;   Family History  Problem Relation Age of Onset  . Diabetes Maternal Uncle   . Heart attack Maternal Grandmother   . Heart disease Maternal Grandmother   . Emphysema Maternal Grandmother   . Colon cancer Neg Hx   . Diabetes  Cousin   . Sickle cell trait Father    History  Substance Use Topics  . Smoking status: Never Smoker   . Smokeless tobacco: Never Used  . Alcohol Use: 3.6 oz/week    0 Glasses of wine, 0 Cans of beer, 6 Shots of liquor, 0 Standard drinks or equivalent per week     Comment: occasionally   OB History    Gravida Para Term Preterm AB TAB SAB Ectopic Multiple Living   1 1 1       1      Review of Systems  Constitutional: Negative for fever and chills.  Respiratory: Negative.   Cardiovascular: Negative.   Gastrointestinal: Positive for nausea, vomiting, abdominal pain and diarrhea.  Genitourinary: Negative.   Musculoskeletal: Negative.   Skin: Negative.   Neurological: Negative.       Allergies  Iron dextran  Home Medications   Prior to Admission medications   Medication Sig Start Date End Date Taking? Authorizing Provider  adalimumab (HUMIRA) 40 MG/0.8ML injection Inject 0.8 mLs (40 mg total) into the skin every 14 (fourteen) days. 03/23/13  Yes Lafayette Dragon, MD  Budesonide (UCERIS) 9 MG TB24 Take 1 tablet by mouth daily. Patient taking differently: Take 9 mg by mouth daily.  03/07/14  Yes Lafayette Dragon, MD  clonazePAM (KLONOPIN) 1 MG tablet Take 1 mg by mouth at bedtime as needed (sleep).  08/21/14  Yes Historical Provider, MD  etonogestrel (IMPLANON) 68 MG IMPL implant Inject 1 each into the skin once. 10/25/13  Yes Historical Provider, MD  ibuprofen (ADVIL,MOTRIN) 200 MG tablet Take 800 mg by mouth every 6 (six) hours as needed for moderate pain.   Yes Historical Provider, MD  lamoTRIgine (LAMICTAL) 25 MG tablet Take 25 mg by mouth 2 (two) times daily.  08/21/14  Yes Historical Provider, MD  mercaptopurine (PURINETHOL) 50 MG tablet Take 50 mg by mouth daily. Give on an empty stomach 1 hour before or 2 hours after meals. Caution: Chemotherapy.   Yes Historical Provider, MD  promethazine (PHENERGAN) 25 MG tablet Take 1 tablet every 4 hours prn Patient taking differently: Take 25 mg  by mouth every 4 (four) hours as needed for nausea. Take 1 tablet every 4 hours prn 03/07/14  Yes Lafayette Dragon, MD  ranitidine (ZANTAC) 75 MG tablet Take 1 tablet (75 mg total) by mouth daily. Patient not taking: Reported on 09/09/2014 05/05/14   Lafayette Dragon, MD  zolpidem (AMBIEN) 5 MG tablet Take 1 tablet (5 mg total) by mouth at bedtime as needed for sleep. Patient not taking: Reported on 09/09/2014 05/02/14   Lafayette Dragon, MD   BP 145/85 mmHg  Pulse 69  Temp(Src) 98 F (36.7 C) (Oral)  Resp 18  SpO2 100% Physical Exam  Constitutional: She is oriented to person, place, and time. She appears well-developed and well-nourished.  HENT:  Head: Normocephalic.  Neck: Normal range of motion. Neck supple.  Cardiovascular: Normal rate and regular rhythm.   No murmur heard. Pulmonary/Chest: Effort normal and breath sounds normal. She has no wheezes. She has no rales.  Abdominal: Soft. Bowel sounds are normal. She exhibits no distension. There is tenderness. There is no rebound and no guarding.  Greater tenderness in LUQ with less diffuse tenderness. Soft abdomen. Well healed surgical scars.   Musculoskeletal: Normal range of motion.  Neurological: She is alert and oriented to person, place, and time.  Skin: Skin is warm and dry. No rash noted.  Psychiatric: She has a normal mood and affect.    ED Course  Procedures (including critical care time) Labs Review Labs Reviewed  CBC WITH DIFFERENTIAL/PLATELET - Abnormal; Notable for the following:    WBC 10.7 (*)    RDW 15.8 (*)    Platelets 404 (*)    Neutrophils Relative % 80 (*)    Neutro Abs 8.5 (*)    All other components within normal limits  URINALYSIS, ROUTINE W REFLEX MICROSCOPIC - Abnormal; Notable for the following:    APPearance TURBID (*)    Hgb urine dipstick LARGE (*)    All other components within normal limits  URINE MICROSCOPIC-ADD ON - Abnormal; Notable for the following:    Squamous Epithelial / LPF MANY (*)     Bacteria, UA MANY (*)    All other components within normal limits  BASIC METABOLIC PANEL  OCCULT BLOOD X 1 CARD TO LAB, STOOL  POC URINE PREG, ED    Imaging Review Ct Abdomen Pelvis W Contrast  09/10/2014   CLINICAL DATA:  Left lower abdominal pain. Dark tarry stools. History of Crohn's disease and small bowel obstruction.  EXAM: CT ABDOMEN AND PELVIS WITH CONTRAST  TECHNIQUE: Multidetector CT imaging of the abdomen and pelvis was performed using the standard protocol following bolus administration of intravenous contrast.  CONTRAST:  169m OMNIPAQUE IOHEXOL 300 MG/ML SOLN, 577mOMNIPAQUE IOHEXOL 300 MG/ML SOLN  COMPARISON:  CT 03/02/2012  FINDINGS: The included lung bases are clear.  Clips in the gallbladder fossa from cholecystectomy. No focal hepatic lesion. There is no biliary dilatation. The spleen, pancreas, and adrenal glands are normal. Kidneys are symmetric in size without hydronephrosis or perinephric stranding.  The stomach is physiologically distended with ingested contrast. There are no dilated or thickened bowel loops. There are no signs of small bowel inflammation. Enteric sutures noted at the base of the cecum. Small volume of stool throughout the colon. There is no colonic wall thickening or or signs of colonic inflammation. Asymmetric soft tissue density in the left gluteal cleft that is only partially included in the field of view, no fluid collection or surrounding inflammatory change.  No free air, free fluid, or intra-abdominal fluid collection. Postsurgical change in the anterior abdominal wall. There is no adenopathy. Abdominal aorta is normal in caliber.  Within the pelvis the bladder is decompressed. Uterus is normal for age. Follicular 2.8 cm cyst in the left ovary. No pelvic free fluid.  There are no acute or suspicious osseous abnormalities.  IMPRESSION: 1. No acute abnormality in the abdomen/pelvis. 2. No signs of bowel inflammation or active inflammatory bowel disease. 3.  Follicular 2.8 cm cyst in the left ovary.   Electronically Signed   By: MeJeb Levering.D.   On: 09/10/2014 02:40     EKG Interpretation None      MDM   Final diagnoses:  None    1. Gastroenteritis  CT scan negative for acute process. Lab studies  are essentially normal. Symptoms of pain and nausea are controlled. VSS. She is felt stable for discharge without concern for recurrent obstruction. She has community follow up (Dr. Asa Lente). Return precautions are provided.     Dewaine Oats, PA-C 09/10/14 [id number]  Everlene Balls, MD 09/10/14 [phone]

## 2014-09-19 ENCOUNTER — Encounter: Payer: Self-pay | Admitting: Internal Medicine

## 2014-09-19 ENCOUNTER — Ambulatory Visit (INDEPENDENT_AMBULATORY_CARE_PROVIDER_SITE_OTHER): Payer: Medicaid Other | Admitting: Internal Medicine

## 2014-09-19 VITALS — BP 112/78 | HR 92 | Ht 64.0 in | Wt 216.8 lb

## 2014-09-19 DIAGNOSIS — R197 Diarrhea, unspecified: Secondary | ICD-10-CM

## 2014-09-19 DIAGNOSIS — K50913 Crohn's disease, unspecified, with fistula: Secondary | ICD-10-CM

## 2014-09-19 DIAGNOSIS — D899 Disorder involving the immune mechanism, unspecified: Secondary | ICD-10-CM

## 2014-09-19 DIAGNOSIS — D849 Immunodeficiency, unspecified: Secondary | ICD-10-CM

## 2014-09-19 MED ORDER — PREDNISONE 10 MG PO TABS: ORAL_TABLET | ORAL | Status: DC

## 2014-09-19 MED ORDER — PROMETHAZINE HCL 25 MG PO TABS: 25.0000 mg | ORAL_TABLET | Freq: Four times a day (QID) | ORAL | Status: DC | PRN

## 2014-09-19 MED ORDER — METRONIDAZOLE 250 MG PO TABS: 250.0000 mg | ORAL_TABLET | Freq: Three times a day (TID) | ORAL | Status: DC

## 2014-09-19 MED ORDER — ESOMEPRAZOLE MAGNESIUM 40 MG PO CPDR: 40.0000 mg | DELAYED_RELEASE_CAPSULE | Freq: Every day | ORAL | Status: DC

## 2014-09-19 NOTE — Patient Instructions (Addendum)
We have sent in your prescriptions to your pharmacy, phenergan faxed to [facility]. Your follow up appointment is scheduled on 10/27/2014 at 3:30pm Follow your prednisone taper Discontinue ranitidine Dr Asa Lente

## 2014-09-19 NOTE — Progress Notes (Signed)
[name] Nov 26, 1990 [id number]  Note: This dictation was prepared with Dragon digital system. Any transcriptional errors that result from this procedure are unintentional.   History of Present Illness: This is a 24 year old African-American female with complicated long-standing Crohn's disease since age 9. Last office visit October 2015. Last colonoscopy in September 2015 show no active Crohn's disease in the colon, ileocecal anastomosis or in the small bowel. She has been on budesonide 9 mg daily, Humira 40 mg every 2 weeks, she completed Flagyl course. We have discontinued 6-MP on last visit. She has been doing well until recently when she develops epigastric pain and right upper quadrant abdominal pain as well as diarrhea. She was seen in emergency room several days ago where CT scan of the abdomen show no active process. She had a cholecystectomy 2009. Colectomy with diverging ileostomy in 2011 and closure of ileostomy in February 2012. She had a rectal abscess drained in October 2014 and a recurrence last fall. Upper endoscopy in April 2013 show no evidence of Crohn's disease. She has chronic iron deficiency and is allergic to iron dextran.    Past Medical History  Diagnosis Date  . 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 Candida esophagitis   . Ovarian cyst, left   . Anemia     iron def  . DVT (deep venous thrombosis) 08/2010    Left IJ (postop) anticoag x 3 mo  . Small bowel obstruction 2011  . Acute renal failure   . Allergy   . Blood transfusion   . Perianal fistula   . 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 Gastroparesis   . Blood transfusion without reported diagnosis   . Perirectal abscess 04/21/2013  . PONV (postoperative nausea and vomiting)   . Hypertension 2011    resolved - after 2011 surgery, no problems  . SVT (supraventricular tachycardia) 2011    resolved, after 2011 surgery, no problem   . Anxiety 2011    resolved - after 2011 surgery, no problem  . Depression 2011    resolved - after 2011 surgery, no problem  . History of blood transfusion 2011    [place]  2 units transfuses  . MRSA (methicillin resistant Staphylococcus aureus) 04/2010    multiple abscesses  . GERD (gastroesophageal reflux disease)     Past Surgical History  Procedure Laterality Date  . Cholecystectomy  2009  . Closure of ileostomy/loop  08/2010  . Colectomy with diverting loop ileostomy  2011  . Appendectomy    . Esophagogastroduodenoscopy  11/11/2011    Procedure: ESOPHAGOGASTRODUODENOSCOPY (EGD);  Surgeon: Lafayette Dragon, MD;  Location: [facility];  Service: Endoscopy;  Laterality: N/A;  . Examination under anesthesia  11/12/2011    Procedure: EXAM UNDER ANESTHESIA;  Surgeon: Rolm Bookbinder, MD;  Location: [facility];  Service: General;  Laterality: N/A;  . Proctoscopy  11/12/2011    Procedure: PROCTOSCOPY;  Surgeon: Rolm Bookbinder, MD;  Location: [facility];  Service: General;  Laterality: N/A;  . Incision and drainage perirectal abscess  11/12/2011    Procedure: IRRIGATION AND DEBRIDEMENT PERIRECTAL ABSCESS;  Surgeon: Rolm Bookbinder, MD;  Location: [facility];  Service: General;  Laterality: N/A;  . Incision and drainage perirectal abscess  12/11/2011    Procedure: IRRIGATION AND DEBRIDEMENT PERIRECTAL ABSCESS;  Surgeon: [name], MD;  Location: [facility];  Service: General;  Laterality: N/A;  . Examination under anesthesia N/A  04/01/2013    Procedure: EXAM UNDER ANESTHESIA;  Surgeon: Stark Klein, MD;  Location: [facility];  Service: General;  Laterality: N/A;  . Incision and drainage perirectal abscess N/A 04/01/2013    Procedure: IRRIGATION AND DEBRIDEMENT PERIRECTAL ABSCESS;  Surgeon: Stark Klein, MD;  Location: [facility];  Service: General;  Laterality: N/A;  . Colonoscopy  2013  . Cesarean section N/A 08/16/2013    Procedure: Primary CESAREAN SECTION;  Surgeon: Betsy Coder, MD;  Location: [facility];   Service: Obstetrics;  Laterality: N/A;    Allergies  Allergen Reactions  . Iron Dextran Anaphylaxis    Family history and social history have been reviewed.  Review of Systems: Denies fever. Denies weight loss. Positive for diarrhea. No rectal bleeding  The remainder of the 10 point ROS is negative except as outlined in the H&P  Physical Exam: General Appearance Well developed, in no distress, overweight Eyes  Non icteric  HEENT  Non traumatic, normocephalic  Mouth No lesion, tongue papillated, no cheilosis Neck Supple without adenopathy, thyroid not enlarged, no carotid bruits, no JVD Lungs Clear to auscultation bilaterally COR Normal S1, normal S2, regular rhythm, no murmur, quiet precordium Abdomen soft very tender in epigastrium and along right costal margin. No palpable mass or pulsations. Lower abdomen unremarkable. Well-healed surgical scars Rectal not done Extremities  No pedal edema Skin No lesions Neurological Alert and oriented x 3 Psychological Normal mood and affect  Assessment and Plan:   24 year old African-American female with the complicated Crohn's disease on biologicals. She now has epigastric tenderness and pain which is suggestive of gastritis. We will start Nexium 40 mg daily and refill Phenergan. She would limit ibuprofen use. Because of the diarrhea we will start orally prednisone taper starting at 20 mg reducing by 5 mg once a week. She will also continue on all other medications and add Flagyl 250 mg 3 times a day for one week I will see her in 6-8 weeks    [name] 09/19/2014

## 2014-10-26 ENCOUNTER — Encounter (HOSPITAL_COMMUNITY): Payer: Self-pay | Admitting: *Deleted

## 2014-10-26 ENCOUNTER — Emergency Department (HOSPITAL_COMMUNITY)
Admission: EM | Admit: 2014-10-26 | Discharge: 2014-10-26 | Disposition: A | Discharge status: Home / Self Care | Payer: Medicaid Other | Attending: Emergency Medicine | Admitting: Emergency Medicine

## 2014-10-26 DIAGNOSIS — Z792 Long term (current) use of antibiotics: Secondary | ICD-10-CM | POA: Diagnosis not present

## 2014-10-26 DIAGNOSIS — I10 Essential (primary) hypertension: Secondary | ICD-10-CM | POA: Insufficient documentation

## 2014-10-26 DIAGNOSIS — F419 Anxiety disorder, unspecified: Secondary | ICD-10-CM | POA: Diagnosis not present

## 2014-10-26 DIAGNOSIS — Z79899 Other long term (current) drug therapy: Secondary | ICD-10-CM | POA: Diagnosis not present

## 2014-10-26 DIAGNOSIS — K5090 Crohn's disease, unspecified, without complications: Secondary | ICD-10-CM | POA: Diagnosis not present

## 2014-10-26 DIAGNOSIS — Z862 Personal history of diseases of the blood and blood-forming organs and certain disorders involving the immune mechanism: Secondary | ICD-10-CM | POA: Insufficient documentation

## 2014-10-26 DIAGNOSIS — Z86718 Personal history of other venous thrombosis and embolism: Secondary | ICD-10-CM | POA: Insufficient documentation

## 2014-10-26 DIAGNOSIS — R110 Nausea: Secondary | ICD-10-CM | POA: Diagnosis present

## 2014-10-26 DIAGNOSIS — Z8619 Personal history of other infectious and parasitic diseases: Secondary | ICD-10-CM | POA: Insufficient documentation

## 2014-10-26 DIAGNOSIS — T50905A Adverse effect of unspecified drugs, medicaments and biological substances, initial encounter: Secondary | ICD-10-CM

## 2014-10-26 DIAGNOSIS — Z7951 Long term (current) use of inhaled steroids: Secondary | ICD-10-CM | POA: Diagnosis not present

## 2014-10-26 DIAGNOSIS — F329 Major depressive disorder, single episode, unspecified: Secondary | ICD-10-CM | POA: Diagnosis not present

## 2014-10-26 DIAGNOSIS — Z8742 Personal history of other diseases of the female genital tract: Secondary | ICD-10-CM | POA: Insufficient documentation

## 2014-10-26 DIAGNOSIS — T450X5A Adverse effect of antiallergic and antiemetic drugs, initial encounter: Secondary | ICD-10-CM | POA: Diagnosis not present

## 2014-10-26 DIAGNOSIS — Z8614 Personal history of Methicillin resistant Staphylococcus aureus infection: Secondary | ICD-10-CM | POA: Insufficient documentation

## 2014-10-26 DIAGNOSIS — Z3202 Encounter for pregnancy test, result negative: Secondary | ICD-10-CM | POA: Diagnosis not present

## 2014-10-26 DIAGNOSIS — K219 Gastro-esophageal reflux disease without esophagitis: Secondary | ICD-10-CM | POA: Insufficient documentation

## 2014-10-26 LAB — CBC WITH DIFFERENTIAL/PLATELET
Basophils Absolute: 0 10*3/uL (ref 0.0–0.1)
Basophils Relative: 0 % (ref 0–1)
EOS PCT: 0 % (ref 0–5)
Eosinophils Absolute: 0 10*3/uL (ref 0.0–0.7)
HCT: 39.9 % (ref 36.0–46.0)
Hemoglobin: 13.1 g/dL (ref 12.0–15.0)
LYMPHS PCT: 16 % (ref 12–46)
Lymphs Abs: 1.5 10*3/uL (ref 0.7–4.0)
MCH: 27.8 pg (ref 26.0–34.0)
MCHC: 32.8 g/dL (ref 30.0–36.0)
MCV: 84.5 fL (ref 78.0–100.0)
MONO ABS: 0.5 10*3/uL (ref 0.1–1.0)
MONOS PCT: 6 % (ref 3–12)
NEUTROS ABS: 7.1 10*3/uL (ref 1.7–7.7)
Neutrophils Relative %: 78 % — ABNORMAL HIGH (ref 43–77)
Platelets: 360 10*3/uL (ref 150–400)
RBC: 4.72 MIL/uL (ref 3.87–5.11)
RDW: 15.3 % (ref 11.5–15.5)
WBC: 9.2 10*3/uL (ref 4.0–10.5)

## 2014-10-26 LAB — COMPREHENSIVE METABOLIC PANEL
ALBUMIN: 4.3 g/dL (ref 3.5–5.2)
ALT: 18 U/L (ref 0–35)
ANION GAP: 7 (ref 5–15)
AST: 20 U/L (ref 0–37)
Alkaline Phosphatase: 61 U/L (ref 39–117)
BUN: 8 mg/dL (ref 6–23)
CO2: 23 mmol/L (ref 19–32)
Calcium: 9.1 mg/dL (ref 8.4–10.5)
Chloride: 106 mmol/L (ref 96–112)
Creatinine, Ser: 0.6 mg/dL (ref 0.50–1.10)
GFR calc Af Amer: >90 mL/min (ref >90)
GFR calc non Af Amer: >90 mL/min (ref >90)
GLUCOSE: 105 mg/dL — AB (ref 70–99)
Potassium: 2.9 mmol/L — ABNORMAL LOW (ref 3.5–5.1)
Sodium: 136 mmol/L (ref 135–145)
TOTAL PROTEIN: 7.9 g/dL (ref 6.0–8.3)
Total Bilirubin: 0.4 mg/dL (ref 0.3–1.2)

## 2014-10-26 LAB — SEDIMENTATION RATE: Sed Rate: 29 mm/hr — ABNORMAL HIGH (ref 0–22)

## 2014-10-26 LAB — URINALYSIS, ROUTINE W REFLEX MICROSCOPIC
BILIRUBIN URINE: NEGATIVE
Glucose, UA: NEGATIVE mg/dL
Ketones, ur: NEGATIVE mg/dL
NITRITE: NEGATIVE
Protein, ur: 30 mg/dL — AB
SPECIFIC GRAVITY, URINE: 1.025 (ref 1.005–1.030)
Urobilinogen, UA: 0.2 mg/dL (ref 0.0–1.0)
pH: 6.5 (ref 5.0–8.0)

## 2014-10-26 LAB — URINE MICROSCOPIC-ADD ON

## 2014-10-26 LAB — PREGNANCY, URINE: Preg Test, Ur: NEGATIVE

## 2014-10-26 LAB — I-STAT BETA HCG BLOOD, ED (MC, WL, AP ONLY): I-stat hCG, quantitative: <5 m[IU]/mL (ref <5)

## 2014-10-26 LAB — LIPASE, BLOOD: Lipase: 16 U/L (ref 11–59)

## 2014-10-26 LAB — HCG, SERUM, QUALITATIVE: PREG SERUM: NEGATIVE

## 2014-10-26 MED ORDER — PROMETHAZINE HCL 25 MG PO TABS: 25.0000 mg | ORAL_TABLET | Freq: Four times a day (QID) | ORAL | Status: DC | PRN

## 2014-10-26 MED ORDER — ONDANSETRON HCL 4 MG/2ML IJ SOLN
4.0000 mg | Freq: Once | INTRAMUSCULAR | Status: AC
  Administered 2014-10-26: 4 mg via INTRAVENOUS
  Filled 2014-10-26: qty 2

## 2014-10-26 MED ORDER — POTASSIUM CHLORIDE 10 MEQ/100ML IV SOLN
10.0000 meq | Freq: Once | INTRAVENOUS | Status: AC
  Administered 2014-10-26: 10 meq via INTRAVENOUS
  Filled 2014-10-26: qty 100

## 2014-10-26 MED ORDER — DIPHENHYDRAMINE HCL 50 MG/ML IJ SOLN
50.0000 mg | Freq: Once | INTRAMUSCULAR | Status: AC
  Administered 2014-10-26: 50 mg via INTRAVENOUS

## 2014-10-26 MED ORDER — SODIUM CHLORIDE 0.9 % IV SOLN
1000.0000 mL | Freq: Once | INTRAVENOUS | Status: AC
  Administered 2014-10-26: 1000 mL via INTRAVENOUS

## 2014-10-26 MED ORDER — SODIUM CHLORIDE 0.9 % IV SOLN
1000.0000 mL | INTRAVENOUS | Status: DC
  Administered 2014-10-26: 1000 mL via INTRAVENOUS

## 2014-10-26 MED ORDER — METOCLOPRAMIDE HCL 5 MG/ML IJ SOLN
10.0000 mg | Freq: Once | INTRAMUSCULAR | Status: AC
  Administered 2014-10-26: 10 mg via INTRAVENOUS
  Filled 2014-10-26: qty 2

## 2014-10-26 MED ORDER — LORAZEPAM 2 MG/ML IJ SOLN: 1.0000 mg | Freq: Once | INTRAMUSCULAR | Status: DC

## 2014-10-26 MED ORDER — SCOPOLAMINE 1 MG/3DAYS TD PT72
1.0000 | MEDICATED_PATCH | TRANSDERMAL | Status: DC
  Administered 2014-10-26: 1.5 mg via TRANSDERMAL
  Filled 2014-10-26: qty 1

## 2014-10-26 MED ORDER — DIPHENHYDRAMINE HCL 50 MG/ML IJ SOLN
INTRAMUSCULAR | Status: AC
  Filled 2014-10-26: qty 1

## 2014-10-26 MED ORDER — PROMETHAZINE HCL 25 MG/ML IJ SOLN
12.5000 mg | Freq: Once | INTRAMUSCULAR | Status: AC
  Administered 2014-10-26: 12.5 mg via INTRAVENOUS
  Filled 2014-10-26: qty 1

## 2014-10-26 MED ORDER — SODIUM CHLORIDE 0.9 % IV BOLUS (SEPSIS)
1000.0000 mL | Freq: Once | INTRAVENOUS | Status: AC
  Administered 2014-10-26: 1000 mL via INTRAVENOUS

## 2014-10-26 NOTE — ED Notes (Signed)
Pt reports severe nausea over the last couple of months; pt states that she has forced herself to vomit to see if that would help with no improvement; pt tearful and crying about the nausea; pt states that nothing seems to make it better or worse

## 2014-10-26 NOTE — Discharge Instructions (Signed)

## 2014-10-26 NOTE — ED Notes (Signed)
Pt states that zofran doesn't work for her

## 2014-10-26 NOTE — ED Provider Notes (Signed)
CSN: [id number]     Arrival date & time 10/26/14  1324 History   First MD Initiated Contact with Patient 10/26/14 [phone]     Chief Complaint  Patient presents with  . Nausea      HPI  Presents violation of nausea. Has a long-standing history of Crohn's disease. Previous resection with temporary diverting colostomy, reanastomosis. She is on biological Humira now. Follows with Dr. [name]. Has appointment with Dr. [name] tomorrow. Nausea for the last several months. Intermittently relieved with medications at home. Presents today with worsening of her nausea. Several episodes of self-induced emesis this morning. Does not feel better.  Past Medical History  Diagnosis Date  . 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 Candida esophagitis   . Ovarian cyst, left   . Anemia     iron def  . DVT (deep venous thrombosis) 08/2010    Left IJ (postop) anticoag x 3 mo  . Small bowel obstruction 2011  . Acute renal failure   . Allergy   . Blood transfusion   . Perianal fistula   . 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 Gastroparesis   . Blood transfusion without reported diagnosis   . Perirectal abscess 04/21/2013  . PONV (postoperative nausea and vomiting)   . Hypertension 2011    resolved - after 2011 surgery, no problems  . SVT (supraventricular tachycardia) 2011    resolved, after 2011 surgery, no problem  . Anxiety 2011    resolved - after 2011 surgery, no problem  . Depression 2011    resolved - after 2011 surgery, no problem  . History of blood transfusion 2011    [place]  2 units transfuses  . MRSA (methicillin resistant Staphylococcus aureus) 04/2010    multiple abscesses  . GERD (gastroesophageal reflux disease)    Past Surgical History  Procedure Laterality Date  . Cholecystectomy  2009  . Closure of ileostomy/loop  08/2010  . Colectomy with diverting loop ileostomy  2011  . Appendectomy    .  Esophagogastroduodenoscopy  11/11/2011    Procedure: ESOPHAGOGASTRODUODENOSCOPY (EGD);  Surgeon: Lafayette Dragon, MD;  Location: [facility];  Service: Endoscopy;  Laterality: N/A;  . Examination under anesthesia  11/12/2011    Procedure: EXAM UNDER ANESTHESIA;  Surgeon: Rolm Bookbinder, MD;  Location: [facility];  Service: General;  Laterality: N/A;  . Proctoscopy  11/12/2011    Procedure: PROCTOSCOPY;  Surgeon: Rolm Bookbinder, MD;  Location: [facility];  Service: General;  Laterality: N/A;  . Incision and drainage perirectal abscess  11/12/2011    Procedure: IRRIGATION AND DEBRIDEMENT PERIRECTAL ABSCESS;  Surgeon: Rolm Bookbinder, MD;  Location: [facility];  Service: General;  Laterality: N/A;  . Incision and drainage perirectal abscess  12/11/2011    Procedure: IRRIGATION AND DEBRIDEMENT PERIRECTAL ABSCESS;  Surgeon: [name], MD;  Location: [facility];  Service: General;  Laterality: N/A;  . Examination under anesthesia N/A 04/01/2013    Procedure: Jasmine December UNDER ANESTHESIA;  Surgeon: Stark Klein, MD;  Location: [facility];  Service: General;  Laterality: N/A;  . Incision and drainage perirectal abscess N/A 04/01/2013    Procedure: IRRIGATION AND DEBRIDEMENT PERIRECTAL ABSCESS;  Surgeon: Stark Klein, MD;  Location: [facility];  Service: General;  Laterality: N/A;  . Colonoscopy  2013  . Cesarean section N/A 08/16/2013    Procedure: Primary CESAREAN SECTION;  Surgeon: Betsy Coder, MD;  Location: [facility];  Service: Obstetrics;  Laterality: N/A;   Family History  Problem Relation Age of Onset  . Diabetes Maternal Uncle   . Heart attack Maternal Grandmother   . Heart disease Maternal Grandmother   . Emphysema Maternal Grandmother   . Colon cancer Neg Hx   . Diabetes Cousin   . Sickle cell trait Father    History  Substance Use Topics  . Smoking status: Never Smoker   . Smokeless tobacco: Never Used  . Alcohol Use: 3.6 oz/week    0 Glasses of wine, 0 Cans of beer, 6 Shots of liquor, 0 Standard drinks or  equivalent per week     Comment: occasionally   OB History    Gravida Para Term Preterm AB TAB SAB Ectopic Multiple Living   1 1 1       1      Review of Systems  Constitutional: Negative for fever, chills, diaphoresis, appetite change and fatigue.  HENT: Negative for mouth sores, sore throat and trouble swallowing.   Eyes: Negative for visual disturbance.  Respiratory: Negative for cough, chest tightness, shortness of breath and wheezing.   Cardiovascular: Negative for chest pain.  Gastrointestinal: Positive for nausea and abdominal pain. Negative for vomiting, diarrhea and abdominal distention.  Endocrine: Negative for polydipsia, polyphagia and polyuria.  Genitourinary: Negative for dysuria, frequency and hematuria.  Musculoskeletal: Negative for gait problem.  Skin: Negative for color change, pallor and rash.  Neurological: Negative for dizziness, syncope, light-headedness and headaches.  Hematological: Does not bruise/bleed easily.  Psychiatric/Behavioral: Negative for behavioral problems and confusion.      Allergies  Iron dextran; Ciprocinonide; and Reglan  Home Medications   Prior to Admission medications   Medication Sig Start Date End Date Taking? Authorizing Provider  Chlorphen-Pseudoephed-APAP (THERAFLU FLU/COLD PO) Take 1 packet by mouth every 4 (four) hours as needed (cold symptoms).   Yes Historical Provider, MD  clonazePAM (KLONOPIN) 1 MG tablet Take 1 mg by mouth at bedtime as needed (sleep).  08/21/14  Yes Historical Provider, MD  esomeprazole (NEXIUM) 40 MG capsule Take 1 capsule (40 mg total) by mouth daily at 12 noon. One daily 09/19/14  Yes Lafayette Dragon, MD  ibuprofen (ADVIL,MOTRIN) 200 MG tablet Take 800 mg by mouth every 6 (six) hours as needed for moderate pain.   Yes Historical Provider, MD  lamoTRIgine (LAMICTAL) 25 MG tablet Take 25 mg by mouth 2 (two) times daily.  08/21/14  Yes Historical Provider, MD  adalimumab (HUMIRA) 40 MG/0.8ML injection Inject 0.8  mLs (40 mg total) into the skin every 14 (fourteen) days. 03/23/13   Lafayette Dragon, MD  Budesonide (UCERIS) 9 MG TB24 Take 1 tablet by mouth daily. Patient not taking: Reported on 10/26/2014 03/07/14   Lafayette Dragon, MD  etonogestrel (IMPLANON) 68 MG IMPL implant Inject 1 each into the skin once. 10/25/13   Historical Provider, MD  metroNIDAZOLE (FLAGYL) 250 MG tablet Take 1 tablet (250 mg total) by mouth 3 (three) times daily. Patient not taking: Reported on 10/26/2014 09/19/14   Lafayette Dragon, MD  predniSONE (DELTASONE) 10 MG tablet Take 3m for 1 week Take 169mfor 1 week Take 106mor 1 week Take 5 mg for 1 week  Then discontinue Patient not taking: Reported on 10/26/2014 09/19/14   DorLafayette DragonD  promethazine (PHENERGAN) 25 MG tablet Take 1 tablet (25 mg total) by mouth every 6 (six) hours as needed for nausea or vomiting. 10/26/14   MarTanna FurryD  ranitidine (  ZANTAC) 75 MG tablet Take 1 tablet (75 mg total) by mouth daily. Patient not taking: Reported on 10/26/2014 05/05/14   Lafayette Dragon, MD   BP 151/81 mmHg  Pulse 75  Temp(Src) 98.3 F (36.8 C) (Oral)  Resp 16  SpO2 100% Physical Exam  Constitutional: She is oriented to person, place, and time. She appears well-developed and well-nourished. No distress.  HENT:  Head: Normocephalic.  Eyes: Conjunctivae are normal. Pupils are equal, round, and reactive to light. No scleral icterus.  Neck: Normal range of motion. Neck supple. No thyromegaly present.  Cardiovascular: Normal rate and regular rhythm.  Exam reveals no gallop and no friction rub.   No murmur heard. Pulmonary/Chest: Effort normal and breath sounds normal. No respiratory distress. She has no wheezes. She has no rales.  Abdominal: Soft. Bowel sounds are normal. She exhibits no distension. There is no tenderness. There is no rebound.  Plan abdomen. Normal active bowel sounds. Well-healed incisions. No palpable anterior abdominal wall hernias. Distally no peritoneal irritation  examined.  Musculoskeletal: Normal range of motion.  Neurological: She is alert and oriented to person, place, and time.  Skin: Skin is warm and dry. No rash noted.  Psychiatric: She has a normal mood and affect. Her behavior is normal.    ED Course  Procedures (including critical care time) Labs Review Labs Reviewed  CBC WITH DIFFERENTIAL/PLATELET - Abnormal; Notable for the following:    Neutrophils Relative % 78 (*)    All other components within normal limits  COMPREHENSIVE METABOLIC PANEL - Abnormal; Notable for the following:    Potassium 2.9 (*)    Glucose, Bld 105 (*)    All other components within normal limits  URINALYSIS, ROUTINE W REFLEX MICROSCOPIC - Abnormal; Notable for the following:    APPearance CLOUDY (*)    Hgb urine dipstick TRACE (*)    Protein, ur 30 (*)    Leukocytes, UA MODERATE (*)    All other components within normal limits  URINE MICROSCOPIC-ADD ON - Abnormal; Notable for the following:    Squamous Epithelial / LPF FEW (*)    Bacteria, UA FEW (*)    All other components within normal limits  SEDIMENTATION RATE - Abnormal; Notable for the following:    Sed Rate 29 (*)    All other components within normal limits  LIPASE, BLOOD  PREGNANCY, URINE  HCG, SERUM, QUALITATIVE  I-STAT BETA HCG BLOOD, ED ([facility], [facility], [facility] ONLY)    Imaging Review No results found.   EKG Interpretation None      MDM   Final diagnoses:  Nausea  Crohn's disease, without complications  Medication reaction, initial encounter    Patient had a marked akathisia reaction to Reglan. Resolved with Benadryl. Nausea overall was improved. She was able take by mouth. Potassium supplemented IV here. She has an appointment with her [name] her GI doctor tomorrow. A very encouraged her to continue small frequent liquids today. GI follow-up tomorrow.  Mild elevation of her sedimentation rate. Consistent with her past. Abdomen is benign to exam and. Do not feel she needs imaging at this  time. Clinically not obstructed. No peritoneal irritation otherwise benign exam to her abdomen.    Tanna Furry, MD 10/26/14 [phone]

## 2014-10-27 ENCOUNTER — Ambulatory Visit (INDEPENDENT_AMBULATORY_CARE_PROVIDER_SITE_OTHER): Payer: Medicaid Other | Admitting: Internal Medicine

## 2014-10-27 ENCOUNTER — Encounter: Payer: Self-pay | Admitting: Internal Medicine

## 2014-10-27 VITALS — BP 128/82 | HR 72 | Ht 64.0 in | Wt 215.1 lb

## 2014-10-27 DIAGNOSIS — D849 Immunodeficiency, unspecified: Secondary | ICD-10-CM

## 2014-10-27 DIAGNOSIS — K50913 Crohn's disease, unspecified, with fistula: Secondary | ICD-10-CM | POA: Diagnosis not present

## 2014-10-27 DIAGNOSIS — R112 Nausea with vomiting, unspecified: Secondary | ICD-10-CM

## 2014-10-27 DIAGNOSIS — D899 Disorder involving the immune mechanism, unspecified: Principal | ICD-10-CM

## 2014-10-27 MED ORDER — METRONIDAZOLE 250 MG PO TABS: 250.0000 mg | ORAL_TABLET | Freq: Two times a day (BID) | ORAL | Status: DC

## 2014-10-27 MED ORDER — SCOPOLAMINE 1 MG/3DAYS TD PT72: 1.0000 | MEDICATED_PATCH | TRANSDERMAL | Status: DC

## 2014-10-27 MED ORDER — PROMETHAZINE HCL 25 MG PO TABS: 25.0000 mg | ORAL_TABLET | Freq: Four times a day (QID) | ORAL | Status: DC | PRN

## 2014-10-27 NOTE — Patient Instructions (Addendum)
We have sent  medications to your pharmacy for you to pick up at your convenience. Stop budesonide.  Dr Asa Lente

## 2014-10-27 NOTE — Progress Notes (Signed)
[name] 1991-02-16 [id number]  Note: This dictation was prepared with Dragon digital system. Any transcriptional errors that result from this procedure are unintentional.   History of Present Illness: This is a 24 year old African-American female with complicated Crohn's disease since age 35. Initially involving duodenum and later on terminal ileum and colon. She had a terminal ileum resection and ileostomy takedown in February 2012. She had rectal abscess drained in October 2014 and again in the fall  2015. As of last endoscopy in April 2013 there was no evidence of Crohn's disease in her duodenum. She has been on Humira 40 mg every 2 weeks. Entocort 3 mg daily. Discontinued  6-MP. She is allergic to iron dextran. She takes Nexium 40 mg daily. Phenergan. She has finished prednisone taper. She takes Flagyl 250 mg 3 times a day when necessaryif increased  rectal drainage.  Today she is complaining of nausea. She was in the emergency room last night with nausea but no vomiting. She has lost about 15 pounds    Past Medical History  Diagnosis Date  . 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 Candida esophagitis   . Ovarian cyst, left   . Anemia     iron def  . DVT (deep venous thrombosis) 08/2010    Left IJ (postop) anticoag x 3 mo  . Small bowel obstruction 2011  . Acute renal failure   . Allergy   . Blood transfusion   . Perianal fistula   . 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 Gastroparesis   . Blood transfusion without reported diagnosis   . Perirectal abscess 04/21/2013  . PONV (postoperative nausea and vomiting)   . Hypertension 2011    resolved - after 2011 surgery, no problems  . SVT (supraventricular tachycardia) 2011    resolved, after 2011 surgery, no problem  . Anxiety 2011    resolved - after 2011 surgery, no problem  . Depression 2011    resolved - after 2011 surgery, no problem  . History of  blood transfusion 2011    [place]  2 units transfuses  . MRSA (methicillin resistant Staphylococcus aureus) 04/2010    multiple abscesses  . GERD (gastroesophageal reflux disease)     Past Surgical History  Procedure Laterality Date  . Cholecystectomy  2009  . Closure of ileostomy/loop  08/2010  . Colectomy with diverting loop ileostomy  2011  . Appendectomy    . Esophagogastroduodenoscopy  11/11/2011    Procedure: ESOPHAGOGASTRODUODENOSCOPY (EGD);  Surgeon: Lafayette Dragon, MD;  Location: [facility];  Service: Endoscopy;  Laterality: N/A;  . Examination under anesthesia  11/12/2011    Procedure: EXAM UNDER ANESTHESIA;  Surgeon: Rolm Bookbinder, MD;  Location: [facility];  Service: General;  Laterality: N/A;  . Proctoscopy  11/12/2011    Procedure: PROCTOSCOPY;  Surgeon: Rolm Bookbinder, MD;  Location: [facility];  Service: General;  Laterality: N/A;  . Incision and drainage perirectal abscess  11/12/2011    Procedure: IRRIGATION AND DEBRIDEMENT PERIRECTAL ABSCESS;  Surgeon: Rolm Bookbinder, MD;  Location: [facility];  Service: General;  Laterality: N/A;  . Incision and drainage perirectal abscess  12/11/2011    Procedure: IRRIGATION AND DEBRIDEMENT PERIRECTAL ABSCESS;  Surgeon: [name], MD;  Location: [facility];  Service: General;  Laterality: N/A;  . Examination under anesthesia N/A 04/01/2013    Procedure: Jasmine December UNDER ANESTHESIA;  Surgeon: Stark Klein, MD;  Location: [facility];  Service: General;  Laterality: N/A;  . Incision and drainage perirectal abscess N/A 04/01/2013    Procedure: IRRIGATION AND DEBRIDEMENT PERIRECTAL ABSCESS;  Surgeon: Stark Klein, MD;  Location: [facility];  Service: General;  Laterality: N/A;  . Colonoscopy  2013  . Cesarean section N/A 08/16/2013    Procedure: Primary CESAREAN SECTION;  Surgeon: Betsy Coder, MD;  Location: [facility];  Service: Obstetrics;  Laterality: N/A;    Allergies  Allergen Reactions  . Iron Dextran Anaphylaxis  . Ciprocinonide [Fluocinolone]   .  Reglan [Metoclopramide] Other (See Comments)    Family history and social history have been reviewed.  Review of Systems: Occasional nausea. No vomiting. Denies diarrhea or rectal bleeding she denies abdominal pain  The remainder of the 10 point ROS is negative except as outlined in the H&P  Physical Exam: General Appearance Well developed, in no distress, overweight Eyes  Non icteric  HEENT  Non traumatic, normocephalic  Mouth No lesion, tongue papillated, no cheilosis Neck Supple without adenopathy, thyroid not enlarged, no carotid bruits, no JVD Lungs Clear to auscultation bilaterally COR Normal S1, normal S2, regular rhythm, no murmur, quiet precordium Abdomen soft nontender abdomen with normoactive bowel sounds. No distention  Rectal not done   Extremities  No pedal edema Skin No lesions Neurological Alert and oriented x 3 Psychological Normal mood and affect  Assessment and Plan:   24 year old African-American female with Crohn's disease currently in remission. Her nausea may be related to steroids. We will discontinue budesonide. She will remain on Humira. Refill Phenergan and Flagyl. Office visit 3 months, also scopolamine patch 1 mg per 72 hours    [name] 10/27/2014

## 2014-11-08 ENCOUNTER — Encounter: Payer: Self-pay | Admitting: Internal Medicine

## 2014-12-28 ENCOUNTER — Encounter: Payer: Self-pay | Admitting: Internal Medicine

## 2014-12-28 NOTE — Telephone Encounter (Signed)
Error

## 2015-01-18 ENCOUNTER — Telehealth: Payer: Self-pay | Admitting: *Deleted

## 2015-01-18 DIAGNOSIS — K50119 Crohn's disease of large intestine with unspecified complications: Secondary | ICD-10-CM

## 2015-01-18 NOTE — Telephone Encounter (Signed)
Spoke with patient and scheduled OV on 02/13/15. She will get lab at that time.

## 2015-01-25 ENCOUNTER — Encounter: Payer: Self-pay | Admitting: Neurology

## 2015-01-25 ENCOUNTER — Ambulatory Visit (INDEPENDENT_AMBULATORY_CARE_PROVIDER_SITE_OTHER): Payer: Medicaid Other | Admitting: Neurology

## 2015-01-25 VITALS — BP 122/82 | HR 78 | Resp 16 | Ht 64.0 in | Wt 190.0 lb

## 2015-01-25 DIAGNOSIS — R279 Unspecified lack of coordination: Secondary | ICD-10-CM

## 2015-01-25 DIAGNOSIS — R569 Unspecified convulsions: Secondary | ICD-10-CM

## 2015-01-25 DIAGNOSIS — G249 Dystonia, unspecified: Secondary | ICD-10-CM

## 2015-01-25 NOTE — Patient Instructions (Signed)
Overall you are doing well but I do want to suggest a few things today:   Remember to drink plenty of fluid, eat healthy meals and do not skip any meals. Try to eat protein with a every meal and eat a healthy snack such as fruit or nuts in between meals. Try to keep a regular sleep-wake schedule and try to exercise daily, particularly in the form of walking, 20-30 minutes a day, if you can.   As far as diagnostic testing: labs, mri of the brain, eeg  I would like to see you back in 3 months, sooner if we need to. Please call us with any interim questions, concerns, problems, updates or refill requests.   Please also call us for any test results so we can go over those with you on the phone.  My clinical assistant and will answer any of your questions and relay your messages to me and also relay most of my messages to you.   Our phone number is [phone]. We also have an after hours call service for urgent matters and there is a physician on-call for urgent questions. For any emergencies you know to call 911 or go to the nearest emergency room

## 2015-01-25 NOTE — Progress Notes (Signed)
[facility]    Provider:  Dr Jaynee Eagles Referring Provider: Rowe Clack, MD Primary Care Physician:  Barrie Lyme, FNP  CC:  Episodic right-sided involuntary shaking, stiffening and muscle spasms.  HPI:  [name] is a 24 y.o. female here as a referral from Dr. Asa Lente for episodes of right-sided involuntary stiffening and muscle spasms. Past medical history of depression, anxiety, Crohn's disease  Symptoms started in 2011. Over time she has had episodes of right arm and right leg shaking. Her joints will feel sore afterwards. It is involuntary and she can't stop it. She puts pressure on her arm when it starts. It is her arm more than her leg. When she puts pressure on it, it continues but it hurts like there is pain in the joints. The right leg hurts too. Lasts a minute to a few minutes. There is never any altered consciousness. Starts at the fingers and then goes up to the elbows and feels like her arm is going to break up to her elbow. It really hurts. The right leg shakes and is involved  but not as much as the right arm. She also has twitching with the arm. Episodes are uncontrollable. Her fingers will lock up and twist and clench and it will hard for her to pop or straighten  them back out. Episodes involve tightness and shaking and possibly posturing. It happens at least once a day. She is in tears when it happens because it hurts so much. Not associated with exercise, not associated with food or temperature or sleeping. No family history of movement disorders or seizures. Always the right side. Massage and trying manipulate her arm during the episode might help. Nothing triggers it. No other focal neurologic complaints.   Reviewed notes, labs and imaging from outside physicians, which showed: MRI of the brain was essentially normal in March 2012. Showed a small focus of gliosis and incidental tiny intrasellar pituitary cyst. Personally reviewed images. Sed  rate 29, cbc and cmp unremarkable   Review of Systems: Patient complains of symptoms per HPI as well as the following symptoms: Fatigue, joint pain, aching muscles, allergies, skin sensitivity, headache, numbness, weakness, tremor, depression, anxiety, not no sleep, insomnia, sleepiness, restless legs. Pertinent negatives per HPI. All others negative.   History   Social History  . Marital Status: Single    Spouse Name: N/A  . Number of Children: 1  . Years of Education: 15   Occupational History  . Student    Social History Main Topics  . Smoking status: Never Smoker   . Smokeless tobacco: Never Used  . Alcohol Use: 3.6 oz/week    0 Glasses of wine, 0 Cans of beer, 6 Shots of liquor, 0 Standard drinks or equivalent per week     Comment: occasionally  . Drug Use: No  . Sexual Activity: Yes    Birth Control/ Protection: Implant   Other Topics Concern  . Not on file   Social History Narrative   Single, lives mother.   Occasionally drinks a soda.     Family History  Problem Relation Age of Onset  . Diabetes Maternal Uncle   . Heart attack Maternal Grandmother   . Heart disease Maternal Grandmother   . Emphysema Maternal Grandmother   . Colon cancer Neg Hx   . Seizures Neg Hx   . Diabetes Cousin   . Sickle cell trait Father     Past Medical History  Diagnosis Date  . Crohn's  disease dx 2004    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.nausea  . Candida esophagitis   . Ovarian cyst, left   . Anemia     iron def  . DVT (deep venous thrombosis) 08/2010    Left IJ (postop) anticoag x 3 mo  . Small bowel obstruction 2011  . Acute renal failure   . Allergy   . Blood transfusion   . Perianal fistula   . 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 Gastroparesis   . Blood transfusion without reported diagnosis   . Perirectal abscess 04/21/2013  . PONV (postoperative nausea and vomiting)   . Hypertension 2011    resolved - after 2011 surgery,  no problems  . SVT (supraventricular tachycardia) 2011    resolved, after 2011 surgery, no problem  . Anxiety 2011    resolved - after 2011 surgery, no problem  . Depression 2011    resolved - after 2011 surgery, no problem  . History of blood transfusion 2011    [place]  2 units transfuses  . MRSA (methicillin resistant Staphylococcus aureus) 04/2010    multiple abscesses  . GERD (gastroesophageal reflux disease)   . Tremor, anxiety related     Past Surgical History  Procedure Laterality Date  . Cholecystectomy  2009  . Closure of ileostomy/loop  08/2010  . Colectomy with diverting loop ileostomy  2011  . Appendectomy    . Esophagogastroduodenoscopy  11/11/2011    Procedure: ESOPHAGOGASTRODUODENOSCOPY (EGD);  Surgeon: Lafayette Dragon, MD;  Location: [facility];  Service: Endoscopy;  Laterality: N/A;  . Examination under anesthesia  11/12/2011    Procedure: EXAM UNDER ANESTHESIA;  Surgeon: Rolm Bookbinder, MD;  Location: [facility];  Service: General;  Laterality: N/A;  . Proctoscopy  11/12/2011    Procedure: PROCTOSCOPY;  Surgeon: Rolm Bookbinder, MD;  Location: [facility];  Service: General;  Laterality: N/A;  . Incision and drainage perirectal abscess  11/12/2011    Procedure: IRRIGATION AND DEBRIDEMENT PERIRECTAL ABSCESS;  Surgeon: Rolm Bookbinder, MD;  Location: [facility];  Service: General;  Laterality: N/A;  . Incision and drainage perirectal abscess  12/11/2011    Procedure: IRRIGATION AND DEBRIDEMENT PERIRECTAL ABSCESS;  Surgeon: [name], MD;  Location: [facility];  Service: General;  Laterality: N/A;  . Examination under anesthesia N/A 04/01/2013    Procedure: Jasmine December UNDER ANESTHESIA;  Surgeon: Stark Klein, MD;  Location: [facility];  Service: General;  Laterality: N/A;  . Incision and drainage perirectal abscess N/A 04/01/2013    Procedure: IRRIGATION AND DEBRIDEMENT PERIRECTAL ABSCESS;  Surgeon: Stark Klein, MD;  Location: [facility];  Service: General;  Laterality: N/A;  . Colonoscopy   2013  . Cesarean section N/A 08/16/2013    Procedure: Primary CESAREAN SECTION;  Surgeon: Betsy Coder, MD;  Location: [facility];  Service: Obstetrics;  Laterality: N/A;    Current Outpatient Prescriptions  Medication Sig Dispense Refill  . adalimumab (HUMIRA) 40 MG/0.8ML injection Inject 0.8 mLs (40 mg total) into the skin every 14 (fourteen) days. 6 each 1  . busPIRone (BUSPAR) 15 MG tablet Take 1/2 to 1 tablet PO Q8H PRN    . escitalopram (LEXAPRO) 20 MG tablet START WITH 1/2 TABLET AT BEDTIME. MAY INCREASE TO 1 TABLET AT BEDTIME AFTER 3 WEEKS  0  . esomeprazole (NEXIUM) 40 MG capsule Take 1 capsule (40 mg total) by mouth daily at 12 noon. One daily 30 capsule 6  . etonogestrel (IMPLANON) 68  MG IMPL implant Inject 1 each into the skin once.    . mercaptopurine (PURINETHOL) 50 MG tablet Take 50 mg by mouth daily. Give on an empty stomach 1 hour before or 2 hours after meals. Caution: Chemotherapy.    . promethazine (PHENERGAN) 25 MG tablet Take 1 tablet (25 mg total) by mouth every 6 (six) hours as needed for nausea or vomiting. 30 tablet 6  . scopolamine (TRANSDERM-SCOP, 1.5 MG,) 1 MG/3DAYS Place 1 patch (1.5 mg total) onto the skin every 3 (three) days. 10 patch 6   No current facility-administered medications for this visit.    Allergies as of 01/25/2015 - Review Complete 10/27/2014  Allergen Reaction Noted  . Iron dextran Anaphylaxis 08/19/2010  . Ciprocinonide [fluocinolone]  10/26/2014  . Reglan [metoclopramide] Other (See Comments) 10/26/2014    Vitals: BP 122/82 mmHg  Pulse 78  Resp 16  Ht 5' 4"  (1.626 m)  Wt 190 lb (86.183 kg)  BMI 32.60 kg/m2 Last Weight:  Wt Readings from Last 1 Encounters:  01/25/15 190 lb (86.183 kg)   Last Height:   Ht Readings from Last 1 Encounters:  01/25/15 5' 4"  (1.626 m)    Physical exam: Exam: Gen: NAD, conversant, well nourised, obese, well groomed                     CV: RRR, no MRG. No Carotid Bruits. No peripheral edema, warm,  nontender Eyes: Conjunctivae clear without exudates or hemorrhage  Neuro: Detailed Neurologic Exam  Speech:    Speech is normal; fluent and spontaneous with normal comprehension.  Cognition:    The patient is oriented to person, place, and time;     recent and remote memory intact;     language fluent;     normal attention, concentration,     fund of knowledge Cranial Nerves:    The pupils are equal, round, and reactive to light. The fundi are normal and spontaneous venous pulsations are present. Visual fields are full to finger confrontation. Extraocular movements are intact. Trigeminal sensation is intact and the muscles of mastication are normal. The face is symmetric. The palate elevates in the midline. Hearing intact. Voice is normal. Shoulder shrug is normal. The tongue has normal motion without fasciculations.   Coordination:    Normal finger to nose and heel to shin. Normal rapid alternating movements.   Gait:    Heel-toe and tandem gait are normal.   Motor Observation:    No asymmetry, no atrophy, and no involuntary movements noted. Tone:    Normal muscle tone.    Posture:    Posture is normal. normal erect    Strength:    Strength is V/V in the upper and lower limbs.      Sensation: intact to LT     Reflex Exam:  DTR's:    Deep tendon reflexes in the upper and lower extremities are normal bilaterally.   Toes:    The toes are downgoing bilaterally.   Clonus:    Clonus is absent.      Assessment/Plan:  24 year old female with a PMHX Crohn's disease who is here for involuntary episodes of right sided arm > leg shaking, severe muscles tightening, posturing which is extremely painful. Differential could be motor seizure vs paroxysmal dyskinesia/dystonia. Patient will videotape the next episodes. Happens daily. Will order eeg and MRI of the brain. If all are non-revealing, will try and capture on long-term monitoring. Will order labs, including ck.  Sarina Ill, MD  [facility] [address] [place], [place] [phone]  Phone [phone] Fax [phone]

## 2015-01-26 ENCOUNTER — Telehealth: Payer: Self-pay

## 2015-01-26 LAB — COMPREHENSIVE METABOLIC PANEL
ALK PHOS: 63 IU/L (ref 39–117)
ALT: 12 IU/L (ref 0–32)
AST: 14 IU/L (ref 0–40)
Albumin/Globulin Ratio: 1.6 (ref 1.1–2.5)
Albumin: 4.2 g/dL (ref 3.5–5.5)
BUN/Creatinine Ratio: 10 (ref 8–20)
BUN: 7 mg/dL (ref 6–20)
Bilirubin Total: <0.2 mg/dL (ref 0.0–1.2)
CO2: 22 mmol/L (ref 18–29)
Calcium: 9.3 mg/dL (ref 8.7–10.2)
Chloride: 105 mmol/L (ref 97–108)
Creatinine, Ser: 0.69 mg/dL (ref 0.57–1.00)
GFR calc Af Amer: 140 mL/min/{1.73_m2} (ref >59)
GFR, EST NON AFRICAN AMERICAN: 121 mL/min/{1.73_m2} (ref >59)
GLUCOSE: 80 mg/dL (ref 65–99)
Globulin, Total: 2.6 g/dL (ref 1.5–4.5)
POTASSIUM: 3.5 mmol/L (ref 3.5–5.2)
Sodium: 142 mmol/L (ref 134–144)
TOTAL PROTEIN: 6.8 g/dL (ref 6.0–8.5)

## 2015-01-26 LAB — CK: Total CK: 164 U/L (ref 24–173)

## 2015-01-26 LAB — MAGNESIUM: Magnesium: 1.9 mg/dL (ref 1.6–2.3)

## 2015-01-26 NOTE — Telephone Encounter (Signed)
I spoke to [place] and she is aware of lab results.

## 2015-01-26 NOTE — Telephone Encounter (Signed)
-----   Message from Melvenia Beam, MD sent at 01/26/2015 10:35 AM EDT ----- Let patient know all he rlabs were normal thanks

## 2015-01-28 ENCOUNTER — Encounter: Payer: Self-pay | Admitting: Neurology

## 2015-01-28 NOTE — Addendum Note (Signed)
Addended by: Sarina Ill B on: 01/28/2015 04:10 PM   Modules accepted: Level of Service

## 2015-01-31 ENCOUNTER — Other Ambulatory Visit: Payer: Medicaid Other

## 2015-02-01 ENCOUNTER — Encounter: Payer: Self-pay | Admitting: Neurology

## 2015-02-13 ENCOUNTER — Ambulatory Visit (INDEPENDENT_AMBULATORY_CARE_PROVIDER_SITE_OTHER): Payer: Medicaid Other | Admitting: Internal Medicine

## 2015-02-13 ENCOUNTER — Other Ambulatory Visit (INDEPENDENT_AMBULATORY_CARE_PROVIDER_SITE_OTHER): Payer: Medicaid Other

## 2015-02-13 ENCOUNTER — Encounter: Payer: Self-pay | Admitting: Internal Medicine

## 2015-02-13 VITALS — BP 110/80 | HR 72 | Ht 64.0 in | Wt 194.0 lb

## 2015-02-13 DIAGNOSIS — K50919 Crohn's disease, unspecified, with unspecified complications: Secondary | ICD-10-CM

## 2015-02-13 LAB — COMPREHENSIVE METABOLIC PANEL
ALBUMIN: 4.4 g/dL (ref 3.5–5.2)
ALK PHOS: 60 U/L (ref 39–117)
ALT: 11 U/L (ref 0–35)
AST: 15 U/L (ref 0–37)
BUN: 12 mg/dL (ref 6–23)
CALCIUM: 9.7 mg/dL (ref 8.4–10.5)
CO2: 28 meq/L (ref 19–32)
CREATININE: 0.7 mg/dL (ref 0.40–1.20)
Chloride: 104 mEq/L (ref 96–112)
GFR: 130.06 mL/min (ref >60.00)
GLUCOSE: 88 mg/dL (ref 70–99)
POTASSIUM: 3.8 meq/L (ref 3.5–5.1)
SODIUM: 138 meq/L (ref 135–145)
TOTAL PROTEIN: 7.9 g/dL (ref 6.0–8.3)
Total Bilirubin: 0.3 mg/dL (ref 0.2–1.2)

## 2015-02-13 LAB — CBC WITH DIFFERENTIAL/PLATELET
BASOS ABS: 0 10*3/uL (ref 0.0–0.1)
Basophils Relative: 0.5 % (ref 0.0–3.0)
EOS PCT: 0.4 % (ref 0.0–5.0)
Eosinophils Absolute: 0 10*3/uL (ref 0.0–0.7)
HCT: 41.7 % (ref 36.0–46.0)
Hemoglobin: 13.8 g/dL (ref 12.0–15.0)
LYMPHS ABS: 2.2 10*3/uL (ref 0.7–4.0)
Lymphocytes Relative: 27.6 % (ref 12.0–46.0)
MCHC: 33.2 g/dL (ref 30.0–36.0)
MCV: 85.4 fl (ref 78.0–100.0)
MONOS PCT: 4.3 % (ref 3.0–12.0)
Monocytes Absolute: 0.3 10*3/uL (ref 0.1–1.0)
NEUTROS ABS: 5.3 10*3/uL (ref 1.4–7.7)
Neutrophils Relative %: 67.2 % (ref 43.0–77.0)
PLATELETS: 413 10*3/uL — AB (ref 150.0–400.0)
RBC: 4.88 Mil/uL (ref 3.87–5.11)
RDW: 16.5 % — ABNORMAL HIGH (ref 11.5–15.5)
WBC: 7.9 10*3/uL (ref 4.0–10.5)

## 2015-02-13 LAB — SEDIMENTATION RATE: Sed Rate: 36 mm/hr — ABNORMAL HIGH (ref 0–22)

## 2015-02-13 LAB — IBC PANEL
IRON: 25 ug/dL — AB (ref 42–145)
SATURATION RATIOS: 4.7 % — AB (ref 20.0–50.0)
Transferrin: 384 mg/dL — ABNORMAL HIGH (ref 212.0–360.0)

## 2015-02-13 LAB — FOLATE: Folate: 7.8 ng/mL (ref >5.9)

## 2015-02-13 LAB — VITAMIN B12: Vitamin B-12: 288 pg/mL (ref 211–911)

## 2015-02-13 LAB — FERRITIN: Ferritin: 17.2 ng/mL (ref 10.0–291.0)

## 2015-02-13 MED ORDER — PROMETHAZINE HCL 25 MG PO TABS: 25.0000 mg | ORAL_TABLET | Freq: Four times a day (QID) | ORAL | Status: DC | PRN

## 2015-02-13 NOTE — Progress Notes (Addendum)
[name] 11-Aug-1990 [id number]  Note: This dictation was prepared with Dragon digital system. Any transcriptional errors that result from this procedure are unintentional.   History of Present Illness: This is a 24 year old African-American female with complicated Crohn's disease since age 56. Initially found in the duodenum when she presented with iron deficiency anemia. She is allergic to iron dextran. She underwent terminal ileal resection , ileostomy and takedown of ileostomy several years ago. She has a history of rectal abscess which was drained in October 2014 in  4 /2015. She is currently on Humira 40 mg every 2 weeks and 6 MP 50 mg daily. She has discontinued steroids and Entocort. Upper endoscopy in April 2013 showed no evidence of duodenal Crohn's disease. She takes Nexium on when necessary basis ,she is doing very well. She disease denies abdominal pain, nausea, rectal bleeding or diarrhea. She has intentionally lost about 10 pounds     Past Medical History  Diagnosis Date  . 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 Candida esophagitis   . Ovarian cyst, left   . Anemia     iron def  . DVT (deep venous thrombosis) 08/2010    Left IJ (postop) anticoag x 3 mo  . Small bowel obstruction 2011  . Acute renal failure   . Allergy   . Blood transfusion   . Perianal fistula   . 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 Gastroparesis   . Blood transfusion without reported diagnosis   . Perirectal abscess 04/21/2013  . PONV (postoperative nausea and vomiting)   . Hypertension 2011    resolved - after 2011 surgery, no problems  . SVT (supraventricular tachycardia) 2011    resolved, after 2011 surgery, no problem  . Anxiety 2011    resolved - after 2011 surgery, no problem  . Depression 2011    resolved - after 2011 surgery, no problem  . History of blood transfusion 2011    [place]  2 units transfuses  .  MRSA (methicillin resistant Staphylococcus aureus) 04/2010    multiple abscesses  . GERD (gastroesophageal reflux disease)   . Tremor, anxiety related     Past Surgical History  Procedure Laterality Date  . Cholecystectomy  2009  . Closure of ileostomy/loop  08/2010  . Colectomy with diverting loop ileostomy  2011  . Appendectomy    . Esophagogastroduodenoscopy  11/11/2011    Procedure: ESOPHAGOGASTRODUODENOSCOPY (EGD);  Surgeon: Lafayette Dragon, MD;  Location: [facility];  Service: Endoscopy;  Laterality: N/A;  . Examination under anesthesia  11/12/2011    Procedure: EXAM UNDER ANESTHESIA;  Surgeon: Rolm Bookbinder, MD;  Location: [facility];  Service: General;  Laterality: N/A;  . Proctoscopy  11/12/2011    Procedure: PROCTOSCOPY;  Surgeon: Rolm Bookbinder, MD;  Location: [facility];  Service: General;  Laterality: N/A;  . Incision and drainage perirectal abscess  11/12/2011    Procedure: IRRIGATION AND DEBRIDEMENT PERIRECTAL ABSCESS;  Surgeon: Rolm Bookbinder, MD;  Location: [facility];  Service: General;  Laterality: N/A;  . Incision and drainage perirectal abscess  12/11/2011    Procedure: IRRIGATION AND DEBRIDEMENT PERIRECTAL ABSCESS;  Surgeon: [name], MD;  Location: [facility];  Service: General;  Laterality: N/A;  . Examination under anesthesia N/A 04/01/2013    Procedure: Jasmine December UNDER ANESTHESIA;  Surgeon: Stark Klein, MD;  Location: [facility];  Service: General;  Laterality: N/A;  . Incision and  drainage perirectal abscess N/A 04/01/2013    Procedure: IRRIGATION AND DEBRIDEMENT PERIRECTAL ABSCESS;  Surgeon: Stark Klein, MD;  Location: [facility];  Service: General;  Laterality: N/A;  . Colonoscopy  2013  . Cesarean section N/A 08/16/2013    Procedure: Primary CESAREAN SECTION;  Surgeon: Betsy Coder, MD;  Location: [facility];  Service: Obstetrics;  Laterality: N/A;    Allergies  Allergen Reactions  . Iron Dextran Anaphylaxis  . Ciprocinonide [Fluocinolone]   . Reglan [Metoclopramide] Other (See  Comments)    Family history and social history have been reviewed.  Review of Systems:  Negative for rectal bleeding diarrhea or abdominal pain  The remainder of the 10 point ROS is negative except as outlined in the H&P  Physical Exam: General Appearance Well developed, in no distress Eyes  Non icteric  HEENT  Non traumatic, normocephalic  Mouth No lesion, tongue papillated, no cheilosis Neck Supple without adenopathy, thyroid not enlarged, no carotid bruits, no JVD Lungs Clear to auscultation bilaterally COR Normal S1, normal S2, regular rhythm, no murmur, quiet precordium Abdomen  Soft with minimal tenderness in left upper quadrant. Around the old ileostomy site. No rebound. Normoactive bowel sounds. Liver edge at costal margin Rectal  Noted done Extremities  No pedal edema Skin No lesions Neurological Alert and oriented x 3 Psychological Normal mood and affect  Assessment and Plan:    24 year old African-American female with Crohn's disease of the rectum ,colon and ileum as well as duodenum, currently in remission. She is to continue Humira 40 mg every 2 weeks and 6-MP 50 mg daily. We will check her blood count , sedimentation rate, metabolic panel, T24 and iron studies. We will refill Phenergan.  She will be followed by Dr.Nandigam.  She needs excuse for work to be able to return on 02/14/2015.  She is due for Quanteferon TB    [name] 02/13/2015

## 2015-02-13 NOTE — Patient Instructions (Addendum)
Your physician has requested that you go to the basement for lab work before leaving today. Please take a prenatal vitamin that has iron daily. We have given you a note for work today. We have sent the following medications to your pharmacy for you to pick up at your convenience:  Phenergan  Dr Gwenlyn Perking, [place]

## 2015-02-15 LAB — QUANTIFERON TB GOLD ASSAY (BLOOD)
Interferon Gamma Release Assay: NEGATIVE
Mitogen value: >10 IU/mL
QUANTIFERON NIL VALUE: 0.09 [IU]/mL
Quantiferon Tb Ag Minus Nil Value: 0.02 IU/mL
TB Ag value: 0.11 IU/mL

## 2015-03-22 ENCOUNTER — Telehealth: Payer: Self-pay | Admitting: *Deleted

## 2015-03-22 NOTE — Telephone Encounter (Signed)
Spoke with patient and scheduled OV with Dr. Silverio Decamp on 05/25/15.

## 2015-05-25 ENCOUNTER — Ambulatory Visit: Payer: Medicaid Other | Admitting: Gastroenterology

## 2015-07-27 ENCOUNTER — Other Ambulatory Visit (INDEPENDENT_AMBULATORY_CARE_PROVIDER_SITE_OTHER): Payer: Medicaid Other

## 2015-07-27 ENCOUNTER — Encounter: Payer: Self-pay | Admitting: Gastroenterology

## 2015-07-27 ENCOUNTER — Ambulatory Visit (INDEPENDENT_AMBULATORY_CARE_PROVIDER_SITE_OTHER): Payer: Medicaid Other | Admitting: Gastroenterology

## 2015-07-27 VITALS — BP 118/80 | HR 84 | Ht 64.0 in | Wt 210.8 lb

## 2015-07-27 DIAGNOSIS — K5000 Crohn's disease of small intestine without complications: Secondary | ICD-10-CM

## 2015-07-27 DIAGNOSIS — K50119 Crohn's disease of large intestine with unspecified complications: Secondary | ICD-10-CM

## 2015-07-27 DIAGNOSIS — K5090 Crohn's disease, unspecified, without complications: Secondary | ICD-10-CM

## 2015-07-27 LAB — FOLATE: FOLATE: 15.4 ng/mL (ref >5.9)

## 2015-07-27 LAB — BASIC METABOLIC PANEL
BUN: 8 mg/dL (ref 6–23)
CHLORIDE: 107 meq/L (ref 96–112)
CO2: 27 mEq/L (ref 19–32)
Calcium: 9.6 mg/dL (ref 8.4–10.5)
Creatinine, Ser: 0.69 mg/dL (ref 0.40–1.20)
GFR: 131.78 mL/min (ref >60.00)
Glucose, Bld: 82 mg/dL (ref 70–99)
POTASSIUM: 4 meq/L (ref 3.5–5.1)
SODIUM: 140 meq/L (ref 135–145)

## 2015-07-27 LAB — CBC WITH DIFFERENTIAL/PLATELET
Basophils Absolute: 0 10*3/uL (ref 0.0–0.1)
Basophils Relative: 0.3 % (ref 0.0–3.0)
EOS PCT: 0.6 % (ref 0.0–5.0)
Eosinophils Absolute: 0 10*3/uL (ref 0.0–0.7)
HEMATOCRIT: 40.3 % (ref 36.0–46.0)
HEMOGLOBIN: 13.2 g/dL (ref 12.0–15.0)
LYMPHS PCT: 25.1 % (ref 12.0–46.0)
Lymphs Abs: 1.6 10*3/uL (ref 0.7–4.0)
MCHC: 32.6 g/dL (ref 30.0–36.0)
MCV: 86.9 fl (ref 78.0–100.0)
Monocytes Absolute: 0.4 10*3/uL (ref 0.1–1.0)
Monocytes Relative: 6.6 % (ref 3.0–12.0)
NEUTROS ABS: 4.3 10*3/uL (ref 1.4–7.7)
Neutrophils Relative %: 67.4 % (ref 43.0–77.0)
PLATELETS: 326 10*3/uL (ref 150.0–400.0)
RBC: 4.64 Mil/uL (ref 3.87–5.11)
RDW: 15.3 % (ref 11.5–15.5)
WBC: 6.3 10*3/uL (ref 4.0–10.5)

## 2015-07-27 LAB — FERRITIN: FERRITIN: 16.4 ng/mL (ref 10.0–291.0)

## 2015-07-27 LAB — HEPATIC FUNCTION PANEL
ALBUMIN: 4 g/dL (ref 3.5–5.2)
ALT: 10 U/L (ref 0–35)
AST: 15 U/L (ref 0–37)
Alkaline Phosphatase: 49 U/L (ref 39–117)
Bilirubin, Direct: 0 mg/dL (ref 0.0–0.3)
TOTAL PROTEIN: 7.3 g/dL (ref 6.0–8.3)
Total Bilirubin: 0.4 mg/dL (ref 0.2–1.2)

## 2015-07-27 LAB — SEDIMENTATION RATE: SED RATE: 23 mm/h — AB (ref 0–22)

## 2015-07-27 LAB — VITAMIN B12: Vitamin B-12: 324 pg/mL (ref 211–911)

## 2015-07-27 NOTE — Patient Instructions (Signed)
Your physician has requested that you go to the basement for lab work before leaving today. Follow up in 3 months with Dr Silverio Decamp.

## 2015-07-27 NOTE — Progress Notes (Signed)
[name]    [id number]    1991-05-17  Primary Care Physician:BAILEY, Holli Humbles, FNP  Referring Physician: Barrie Lyme, [address] [place], [place] [address]  Chief complaint: Crohn's disease  HPI: 25 year old African-American female with complicated Crohn's disease since age 51. Initially found in the duodenum when she presented with iron deficiency anemia. She is allergic to iron dextran. She underwent terminal ileal resection , ileostomy and takedown of ileostomy several years ago. She has a history of rectal abscess which was drained in October 2014 in 4 /2015. She is currently on Humira 40 mg every 2 weeks and 6 MP 50 mg daily.  Upper endoscopy in April 2013 showed no evidence of duodenal Crohn's disease. She takes Nexium on when necessary basis ,she is doing very well. She disease denies abdominal pain, nausea, rectal bleeding or diarrhea. She had 3 episodes of nausea, vomiting and diarrhea in the past 2-3 months, her family had similar symptoms probably was a stomach bug, she currently has no vomiting or diarrhea but still has some intermittent nausea. Her weight is stable.  Outpatient Encounter Prescriptions as of 07/27/2015  Medication Sig  . adalimumab (HUMIRA) 40 MG/0.8ML injection Inject 0.8 mLs (40 mg total) into the skin every 14 (fourteen) days.  . busPIRone (BUSPAR) 15 MG tablet Take 1/2 to 1 tablet PO Q8H PRN  . escitalopram (LEXAPRO) 20 MG tablet START WITH 1/2 TABLET AT BEDTIME. MAY INCREASE TO 1 TABLET AT BEDTIME AFTER 3 WEEKS  . esomeprazole (NEXIUM) 40 MG capsule Take 1 capsule (40 mg total) by mouth daily at 12 noon. One daily  . etonogestrel (IMPLANON) 68 MG IMPL implant Inject 1 each into the skin once.  . mercaptopurine (PURINETHOL) 50 MG tablet Take 50 mg by mouth daily. Give on an empty stomach 1 hour before or 2 hours after meals. Caution: Chemotherapy.  . promethazine (PHENERGAN) 25 MG tablet Take 1 tablet (25 mg total)  by mouth every 6 (six) hours as needed for nausea or vomiting.  . [DISCONTINUED] promethazine (PHENERGAN) 25 MG tablet Take 1 tablet (25 mg total) by mouth every 6 (six) hours as needed for nausea or vomiting.  . [DISCONTINUED] scopolamine (TRANSDERM-SCOP, 1.5 MG,) 1 MG/3DAYS Place 1 patch (1.5 mg total) onto the skin every 3 (three) days. (Patient not taking: Reported on 02/13/2015)   No facility-administered encounter medications on file as of 07/27/2015.    Allergies as of 07/27/2015 - Review Complete 07/27/2015  Allergen Reaction Noted  . Iron dextran Anaphylaxis 08/19/2010  . Ciprocinonide [fluocinolone]  10/26/2014  . Reglan [metoclopramide] Other (See Comments) 10/26/2014    Past Medical History  Diagnosis Date  . 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 Candida esophagitis ([facility])   . Ovarian cyst, left   . Anemia     iron def  . DVT (deep venous thrombosis) ([facility]) 08/2010    Left IJ (postop) anticoag x 3 mo  . Small bowel obstruction ([facility]) 2011  . Acute renal failure ([facility])   . Allergy   . Blood transfusion   . Perianal fistula   . 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 Gastroparesis   . Blood transfusion without reported diagnosis   . Perirectal abscess 04/21/2013  . PONV (postoperative nausea and vomiting)   . Hypertension 2011    resolved - after 2011  surgery, no problems  . SVT (supraventricular tachycardia) ([facility]) 2011    resolved, after 2011 surgery, no problem  . Anxiety 2011    resolved - after 2011 surgery, no problem  . Depression 2011    resolved - after 2011 surgery, no problem  . History of blood transfusion 2011    [place]  2 units transfuses  . MRSA (methicillin resistant Staphylococcus aureus) 04/2010    multiple abscesses  . GERD (gastroesophageal reflux disease)   . Tremor, anxiety related     Past Surgical History  Procedure Laterality Date  . Cholecystectomy  2009  .  Closure of ileostomy/loop  08/2010  . Colectomy with diverting loop ileostomy  2011  . Appendectomy    . Esophagogastroduodenoscopy  11/11/2011    Procedure: ESOPHAGOGASTRODUODENOSCOPY (EGD);  Surgeon: Lafayette Dragon, MD;  Location: [facility];  Service: Endoscopy;  Laterality: N/A;  . Examination under anesthesia  11/12/2011    Procedure: EXAM UNDER ANESTHESIA;  Surgeon: Rolm Bookbinder, MD;  Location: [facility];  Service: General;  Laterality: N/A;  . Proctoscopy  11/12/2011    Procedure: PROCTOSCOPY;  Surgeon: Rolm Bookbinder, MD;  Location: [facility];  Service: General;  Laterality: N/A;  . Incision and drainage perirectal abscess  11/12/2011    Procedure: IRRIGATION AND DEBRIDEMENT PERIRECTAL ABSCESS;  Surgeon: Rolm Bookbinder, MD;  Location: [facility];  Service: General;  Laterality: N/A;  . Incision and drainage perirectal abscess  12/11/2011    Procedure: IRRIGATION AND DEBRIDEMENT PERIRECTAL ABSCESS;  Surgeon: [name], MD;  Location: [facility];  Service: General;  Laterality: N/A;  . Examination under anesthesia N/A 04/01/2013    Procedure: Jasmine December UNDER ANESTHESIA;  Surgeon: Stark Klein, MD;  Location: [facility];  Service: General;  Laterality: N/A;  . Incision and drainage perirectal abscess N/A 04/01/2013    Procedure: IRRIGATION AND DEBRIDEMENT PERIRECTAL ABSCESS;  Surgeon: Stark Klein, MD;  Location: [facility];  Service: General;  Laterality: N/A;  . Colonoscopy  2013  . Cesarean section N/A 08/16/2013    Procedure: Primary CESAREAN SECTION;  Surgeon: Betsy Coder, MD;  Location: [facility];  Service: Obstetrics;  Laterality: N/A;    Family History  Problem Relation Age of Onset  . Diabetes Maternal Uncle   . Heart attack Maternal Grandmother   . Heart disease Maternal Grandmother   . Emphysema Maternal Grandmother   . Colon cancer Neg Hx   . Seizures Neg Hx   . Diabetes Cousin   . Sickle cell trait Father     Social History   Social History  . Marital Status: Single    Spouse Name:  N/A  . Number of Children: 1  . Years of Education: 15   Occupational History  . Student    Social History Main Topics  . Smoking status: Never Smoker   . Smokeless tobacco: Never Used  . Alcohol Use: 3.6 oz/week    0 Glasses of wine, 0 Cans of beer, 6 Shots of liquor, 0 Standard drinks or equivalent per week     Comment: occasionally  . Drug Use: No  . Sexual Activity: Yes    Birth Control/ Protection: Implant   Other Topics Concern  . Not on file   Social History Narrative   Single, lives mother.   Occasionally drinks a soda.       Review of systems: Review of Systems  Constitutional: Negative for fever and chills.  HENT: Negative.   Eyes: Negative for blurred vision.  Respiratory:  Negative for cough, shortness of breath and wheezing.   Cardiovascular: Negative for chest pain and palpitations.  Gastrointestinal: as per HPI Genitourinary: Negative for dysuria, urgency, frequency and hematuria.  Musculoskeletal: Negative for myalgias, back pain and joint pain.  Skin: Negative for itching and rash.  Neurological: Negative for dizziness, tremors, focal weakness, seizures and loss of consciousness.  Endo/Heme/Allergies: Negative for environmental allergies.  Psychiatric/Behavioral: Negative for depression, suicidal ideas and hallucinations.  All other systems reviewed and are negative.   Physical Exam: Filed Vitals:   07/27/15 0835  BP: 118/80  Pulse: 84   Gen:      No acute distress HEENT:  EOMI, sclera anicteric Neck:     No masses; no thyromegaly Lungs:    Clear to auscultation bilaterally; normal respiratory effort CV:         Regular rate and rhythm; no murmurs Abd:      + bowel sounds; soft, non-tender; no palpable masses, no distension Ext:    No edema; adequate peripheral perfusion Skin:      Warm and dry; no rash Neuro: alert and oriented x 3 Psych: normal mood and affect  Data Reviewed:  Colonoscopy September 2015 There was evidence of a prior  ileocolonic surgical anastomosis in the terminal ileum; multiple biopsies were performed , from the terminal ileum anastomosis, and colon Healed rectal abscess and rectovaginal fistula No evidence of active Crohn's disease  1. Surgical [P], terminal ileum, biopsy - BENIGN ILEAL MUCOSA. NO ACTIVE INFLAMMATION OR GRANULOMAS. 2. Surgical [P], anastomosis, biopsy - BENIGN COLONIC MUCOSA. NO ACTIVE INFLAMMATION OR GRANULOMAS. 3. Surgical [P], descending, biopsy - BENIGN COLONIC MUCOSA. NO ACTIVE INFLAMMATION OR GRANULOMAS.   Assessment and Plan/Recommendations:  25 year old female with history of Crohn's disease (upper and lower GI tract) diagnosed at age 79 status post ilial resection currently on Humira and Imuran 50 mg daily is here for follow-up visit Patient's doing well overall except intermittent nausea Advise patient to start probiotic given 3 episodes of possible gastroenteritis in the past 3 months If she has further episodes advised patient to call and be seen in our office We'll check CBC, BMP, LFT, B12, folate and ferritin level Discussed with patient to take B12, folate, iron and multivitamins Return in 3 months  K. Denzil Magnuson , MD [phone] Mon-Fri 8a-5p [phone] after 5p, weekends, holidays

## 2015-12-19 ENCOUNTER — Other Ambulatory Visit: Payer: Self-pay | Admitting: *Deleted

## 2015-12-19 MED ORDER — PROMETHAZINE HCL 25 MG PO TABS: 25.0000 mg | ORAL_TABLET | Freq: Four times a day (QID) | ORAL | Status: DC | PRN

## 2015-12-19 NOTE — Telephone Encounter (Signed)
Promethazine refilled electronically per fax request from pharmacy

## 2016-06-30 ENCOUNTER — Telehealth: Payer: Self-pay | Admitting: Oncology

## 2016-06-30 ENCOUNTER — Encounter: Payer: Self-pay | Admitting: Oncology

## 2016-06-30 NOTE — Telephone Encounter (Signed)
Pt returned my call to schedule an appt. Appt scheduled w/ Shadad on 08/15/16 at 11am. Pt aware to arrive 30 minutes early. Demographics verified.

## 2016-08-08 ENCOUNTER — Telehealth: Payer: Self-pay | Admitting: Oncology

## 2016-08-08 NOTE — Telephone Encounter (Signed)
Lt vm regarding rescheduling 02/02/ appointment

## 2016-08-15 ENCOUNTER — Encounter: Payer: Managed Care, Other (non HMO) | Admitting: Hematology

## 2016-08-15 ENCOUNTER — Encounter: Payer: Managed Care, Other (non HMO) | Admitting: Oncology

## 2016-12-11 ENCOUNTER — Encounter (HOSPITAL_COMMUNITY): Payer: Self-pay | Admitting: Emergency Medicine

## 2016-12-11 ENCOUNTER — Emergency Department (HOSPITAL_COMMUNITY)
Admission: EM | Admit: 2016-12-11 | Discharge: 2016-12-11 | Disposition: A | Discharge status: Home / Self Care | Payer: Managed Care, Other (non HMO) | Attending: Emergency Medicine | Admitting: Emergency Medicine

## 2016-12-11 ENCOUNTER — Emergency Department (HOSPITAL_COMMUNITY): Payer: Managed Care, Other (non HMO)

## 2016-12-11 DIAGNOSIS — Z79899 Other long term (current) drug therapy: Secondary | ICD-10-CM | POA: Diagnosis not present

## 2016-12-11 DIAGNOSIS — I10 Essential (primary) hypertension: Secondary | ICD-10-CM | POA: Diagnosis not present

## 2016-12-11 DIAGNOSIS — J45909 Unspecified asthma, uncomplicated: Secondary | ICD-10-CM | POA: Diagnosis not present

## 2016-12-11 DIAGNOSIS — K50011 Crohn's disease of small intestine with rectal bleeding: Secondary | ICD-10-CM | POA: Diagnosis not present

## 2016-12-11 DIAGNOSIS — R1013 Epigastric pain: Secondary | ICD-10-CM | POA: Diagnosis present

## 2016-12-11 LAB — CBC
HCT: 42.4 % (ref 36.0–46.0)
Hemoglobin: 14.2 g/dL (ref 12.0–15.0)
MCH: 30.1 pg (ref 26.0–34.0)
MCHC: 33.5 g/dL (ref 30.0–36.0)
MCV: 90 fL (ref 78.0–100.0)
PLATELETS: 347 10*3/uL (ref 150–400)
RBC: 4.71 MIL/uL (ref 3.87–5.11)
RDW: 13.6 % (ref 11.5–15.5)
WBC: 7 10*3/uL (ref 4.0–10.5)

## 2016-12-11 LAB — COMPREHENSIVE METABOLIC PANEL
ALBUMIN: 3.8 g/dL (ref 3.5–5.0)
ALK PHOS: 51 U/L (ref 38–126)
ALT: 15 U/L (ref 14–54)
AST: 22 U/L (ref 15–41)
Anion gap: 8 (ref 5–15)
BUN: 7 mg/dL (ref 6–20)
CO2: 26 mmol/L (ref 22–32)
CREATININE: 0.79 mg/dL (ref 0.44–1.00)
Calcium: 9 mg/dL (ref 8.9–10.3)
Chloride: 105 mmol/L (ref 101–111)
GFR calc Af Amer: >60 mL/min (ref >60)
GFR calc non Af Amer: >60 mL/min (ref >60)
GLUCOSE: 97 mg/dL (ref 65–99)
Potassium: 3.5 mmol/L (ref 3.5–5.1)
SODIUM: 139 mmol/L (ref 135–145)
Total Bilirubin: 0.8 mg/dL (ref 0.3–1.2)
Total Protein: 7.7 g/dL (ref 6.5–8.1)

## 2016-12-11 LAB — URINALYSIS, ROUTINE W REFLEX MICROSCOPIC
Bilirubin Urine: NEGATIVE
GLUCOSE, UA: NEGATIVE mg/dL
Ketones, ur: 5 mg/dL — AB
Leukocytes, UA: NEGATIVE
Nitrite: NEGATIVE
PH: 6 (ref 5.0–8.0)
PROTEIN: 30 mg/dL — AB
Specific Gravity, Urine: 1.032 — ABNORMAL HIGH (ref 1.005–1.030)

## 2016-12-11 LAB — I-STAT BETA HCG BLOOD, ED (MC, WL, AP ONLY): I-stat hCG, quantitative: <5 m[IU]/mL (ref <5)

## 2016-12-11 LAB — LIPASE, BLOOD: Lipase: 18 U/L (ref 11–51)

## 2016-12-11 MED ORDER — ONDANSETRON 4 MG PO TBDP: 4.0000 mg | ORAL_TABLET | Freq: Three times a day (TID) | ORAL | 0 refills | Status: AC | PRN

## 2016-12-11 MED ORDER — IOPAMIDOL (ISOVUE-300) INJECTION 61%
INTRAVENOUS | Status: AC
  Filled 2016-12-11: qty 100

## 2016-12-11 MED ORDER — HYDROCODONE-ACETAMINOPHEN 2.5-325 MG PO TABS: 5.0000 mg | ORAL_TABLET | Freq: Three times a day (TID) | ORAL | 0 refills | Status: AC | PRN

## 2016-12-11 MED ORDER — MORPHINE SULFATE (PF) 2 MG/ML IV SOLN
4.0000 mg | Freq: Once | INTRAVENOUS | Status: AC
  Administered 2016-12-11: 4 mg via INTRAVENOUS
  Filled 2016-12-11: qty 2

## 2016-12-11 MED ORDER — ONDANSETRON HCL 4 MG/2ML IJ SOLN
4.0000 mg | Freq: Once | INTRAMUSCULAR | Status: AC
Start: 2016-12-11 — End: 2016-12-11
  Administered 2016-12-11: 4 mg via INTRAVENOUS
  Filled 2016-12-11: qty 2

## 2016-12-11 MED ORDER — IOPAMIDOL (ISOVUE-300) INJECTION 61%
100.0000 mL | Freq: Once | INTRAVENOUS | Status: AC | PRN
  Administered 2016-12-11: 100 mL via INTRAVENOUS

## 2016-12-11 MED ORDER — FAMOTIDINE IN NACL 20-0.9 MG/50ML-% IV SOLN
20.0000 mg | Freq: Once | INTRAVENOUS | Status: AC
Start: 2016-12-11 — End: 2016-12-11
  Administered 2016-12-11: 20 mg via INTRAVENOUS
  Filled 2016-12-11: qty 50

## 2016-12-11 MED ORDER — PREDNISONE 20 MG PO TABS
60.0000 mg | ORAL_TABLET | Freq: Once | ORAL | Status: AC
  Administered 2016-12-11: 60 mg via ORAL
  Filled 2016-12-11: qty 3

## 2016-12-11 MED ORDER — SODIUM CHLORIDE 0.9 % IV BOLUS (SEPSIS)
1000.0000 mL | Freq: Once | INTRAVENOUS | Status: AC
  Administered 2016-12-11: 1000 mL via INTRAVENOUS

## 2016-12-11 MED ORDER — PREDNISOLONE SODIUM PHOSPHATE 25 MG/5ML PO SOLN: ORAL | 0 refills | Status: DC

## 2016-12-11 MED ORDER — DICYCLOMINE HCL 20 MG PO TABS: 20.0000 mg | ORAL_TABLET | Freq: Four times a day (QID) | ORAL | 0 refills | Status: DC

## 2016-12-11 NOTE — ED Triage Notes (Signed)
Pt from home with complaints of central abdominal pain the began last night. Pt states she had 1 episode of emesis. Pt states pain is cramping in nature. Pt has hx of crohn's disease and SBO. Pt has hypoactive bowel sounds. Pt's LBM was yesterday.

## 2016-12-11 NOTE — ED Notes (Signed)
Assisted patient to the restroom.  

## 2016-12-11 NOTE — ED Notes (Signed)
Pt stated she cannot urinate.

## 2016-12-11 NOTE — ED Provider Notes (Signed)
[facility] DEPT Provider Note   CSN: [id number] Arrival date & time: 12/11/16  1247     History   Chief Complaint Chief Complaint  Patient presents with  . Abdominal Pain    HPI [name] is a 26 y.o. female.  The history is provided by the patient.  Abdominal Pain   This is a recurrent problem. The current episode started 6 to 12 hours ago. The problem occurs constantly. Progression since onset: fluctuating. The pain is located in the epigastric region and periumbilical region. The pain is moderate. Associated symptoms include melena, nausea and vomiting. Pertinent negatives include fever, diarrhea and dysuria. Nothing aggravates the symptoms. Nothing relieves the symptoms. Her past medical history is significant for PUD and Crohn's disease.    Past Medical History:  Diagnosis Date  . Acute renal failure ([place])   . Allergy   . Anemia    iron def  . Anxiety 2011   resolved - after 2011 surgery, no problem  . Blood transfusion   . Blood transfusion without reported diagnosis   . Candida esophagitis ([place])   . 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 Depression 2011   resolved - after 2011 surgery, no problem  . DVT (deep venous thrombosis) ([facility]) 08/2010   Left IJ (postop) anticoag x 3 mo  . Gastroparesis   . GERD (gastroesophageal reflux disease)   . 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 History of blood transfusion 2011   Sunflower  2 units transfuses  . Hypertension 2011   resolved - after 2011 surgery, no problems  . MRSA (methicillin resistant Staphylococcus aureus) 04/2010   multiple abscesses  . Ovarian cyst, left   . Perianal fistula   . Perirectal abscess 04/21/2013  . PONV (postoperative nausea and vomiting)   . Small bowel obstruction ([place]) 2011  . SVT (supraventricular tachycardia) ([place]) 2011   resolved, after 2011 surgery, no problem  . Tremor, anxiety related      Patient Active Problem List   Diagnosis Date Noted  . Cesarean delivery delivered 08/16/2013  . B12 deficiency 07/04/2013  . Perirectal abscess 04/21/2013  . Patient currently pregnant 04/21/2013  . Fever 07/23/2012  . Upper abdominal pain 03/12/2012  . Partial small bowel obstruction ([place]) 03/12/2012  . Nausea and vomiting 03/02/2012  . Unspecified asthma(493.90) 03/02/2012  . GERD (gastroesophageal reflux disease) 03/02/2012  . Vaginal candida 02/18/2012  . Abdominal pain 02/17/2012  . Abscess of anal and rectal regions 12/05/2011  . Acute renal failure ([place]) 11/16/2011  . SBO (small bowel obstruction) ([place]) 11/08/2011  . Hypokalemia 11/08/2011  . Anxiety 11/08/2011  . Cellulitis 11/08/2011  . Acute sinus infection 06/27/2011  . Acute bronchitis 06/27/2011  . NAUSEA, CHRONIC 09/06/2010  . ACUTE VENOUS EMBO & THROMB INTRL JUGULAR VEINS 08/31/2010  . GENITAL HERPES 12/19/2008  . UTI'S, RECURRENT 01/25/2008  . ANEMIA, IRON DEFICIENCY, CHRONIC 09/17/2007  . MENORRHAGIA 09/17/2007  . CROHN'S DISEASE 02/29/2004  . ACUT DUOD ULCER W/O MENTION HEMORR PERF/OBST 10/18/2003    Past Surgical History:  Procedure Laterality Date  . APPENDECTOMY    . CESAREAN SECTION N/A 08/16/2013   Procedure: Primary CESAREAN SECTION;  Surgeon: Betsy Coder, MD;  Location: [facility];  Service: Obstetrics;  Laterality: N/A;  . CHOLECYSTECTOMY  2009  . closure of ileostomy/loop  08/2010  . colectomy with diverting loop ileostomy  2011  . COLONOSCOPY  2013  .  ESOPHAGOGASTRODUODENOSCOPY  11/11/2011   Procedure: ESOPHAGOGASTRODUODENOSCOPY (EGD);  Surgeon: Lafayette Dragon, MD;  Location: [facility];  Service: Endoscopy;  Laterality: N/A;  . EXAMINATION UNDER ANESTHESIA  11/12/2011   Procedure: EXAM UNDER ANESTHESIA;  Surgeon: Rolm Bookbinder, MD;  Location: [facility];  Service: General;  Laterality: N/A;  . EXAMINATION UNDER ANESTHESIA N/A 04/01/2013   Procedure: Jasmine December UNDER ANESTHESIA;  Surgeon: Stark Klein, MD;  Location: [facility];  Service: General;  Laterality: N/A;  . INCISION AND DRAINAGE PERIRECTAL ABSCESS  11/12/2011   Procedure: IRRIGATION AND DEBRIDEMENT PERIRECTAL ABSCESS;  Surgeon: Rolm Bookbinder, MD;  Location: [facility];  Service: General;  Laterality: N/A;  . INCISION AND DRAINAGE PERIRECTAL ABSCESS  12/11/2011   Procedure: IRRIGATION AND DEBRIDEMENT PERIRECTAL ABSCESS;  Surgeon: [name], MD;  Location: [facility];  Service: General;  Laterality: N/A;  . INCISION AND DRAINAGE PERIRECTAL ABSCESS N/A 04/01/2013   Procedure: IRRIGATION AND DEBRIDEMENT PERIRECTAL ABSCESS;  Surgeon: Stark Klein, MD;  Location: [facility];  Service: General;  Laterality: N/A;  . PROCTOSCOPY  11/12/2011   Procedure: PROCTOSCOPY;  Surgeon: Rolm Bookbinder, MD;  Location: [facility];  Service: General;  Laterality: N/A;    OB History    Gravida Para Term Preterm AB Living   1 1 1     1    SAB TAB Ectopic Multiple Live Births           1       Home Medications    Prior to Admission medications   Medication Sig Start Date End Date Taking? Authorizing Provider  Budesonide (UCERIS) 9 MG TB24 Take 1 tablet by mouth daily.   Yes [provider]  mercaptopurine (PURINETHOL) 50 MG tablet Take 50 mg by mouth daily. Give on an empty stomach 1 hour before or 2 hours after meals. Caution: Chemotherapy.   Yes [provider]  promethazine (PHENERGAN) 25 MG tablet Take 1 tablet (25 mg total) by mouth every 6 (six) hours as needed for nausea or vomiting. 12/19/15  Yes Nandigam, Venia Minks, MD  adalimumab (HUMIRA) 40 MG/0.8ML injection Inject 0.8 mLs (40 mg total) into the skin every 14 (fourteen) days. Patient not taking: Reported on 12/11/2016 03/23/13   Lafayette Dragon, MD  dicyclomine (BENTYL) 20 MG tablet Take 1 tablet (20 mg total) by mouth every 6 (six) hours. 12/11/16 12/18/16  Fatima Blank, MD  esomeprazole (NEXIUM) 40 MG capsule Take 1 capsule (40 mg total) by mouth daily at 12 noon. One  daily Patient not taking: Reported on 12/11/2016 09/19/14   Lafayette Dragon, MD  Hydrocodone-Acetaminophen 2.5-325 MG TABS Take 5 mg by mouth 3 (three) times daily as needed. 12/11/16 12/16/16  Fatima Blank, MD  ondansetron (ZOFRAN ODT) 4 MG disintegrating tablet Take 1 tablet (4 mg total) by mouth every 8 (eight) hours as needed for nausea or vomiting. 12/11/16 12/14/16  Cardama, Grayce Sessions, MD  PrednisoLONE Sodium Phosphate 25 MG/5ML SOLN 50 mg for 3 days. 57m (8 ml) for 3 days. 30 mg (6 ml) for 3 days. 23m(4 ml) for 3 days. 1091m2 ml) for 3 days. 5mg27m ml) for 3 days. 12/11/16   CardFatima Blank    Family History Family History  Problem Relation Age of Onset  . Heart attack Maternal Grandmother   . Heart disease Maternal Grandmother   . Emphysema Maternal Grandmother   . Sickle cell trait Father   . Diabetes Maternal Uncle   . Diabetes Cousin   .  Colon cancer Neg Hx   . Seizures Neg Hx     Social History Social History  Substance Use Topics  . Smoking status: Never Smoker  . Smokeless tobacco: Never Used  . Alcohol use 3.6 oz/week    6 Shots of liquor per week     Comment: occasionally     Allergies   Iron dextran; Ciprocinonide [fluocinolone]; and Reglan [metoclopramide]   Review of Systems Review of Systems  Constitutional: Negative for fever.  Gastrointestinal: Positive for abdominal pain, melena, nausea and vomiting. Negative for diarrhea.  Genitourinary: Negative for dysuria.   All other systems are reviewed and are negative for acute change except as noted in the HPI   Physical Exam Updated Vital Signs BP (!) 148/101 (BP Location: Left Arm)   Pulse 78   Temp 98.9 F (37.2 C) (Oral)   Resp 16   Ht 5' 4"  (1.626 m)   Wt 93 kg (205 lb)   SpO2 98%   BMI 35.19 kg/m   Physical Exam  Constitutional: She is oriented to person, place, and time. She appears well-developed and well-nourished. No distress.  HENT:  Head: Normocephalic and  atraumatic.  Nose: Nose normal.  Eyes: Conjunctivae and EOM are normal. Pupils are equal, round, and reactive to light. Right eye exhibits no discharge. Left eye exhibits no discharge. No scleral icterus.  Neck: Normal range of motion. Neck supple.  Cardiovascular: Normal rate and regular rhythm.  Exam reveals no gallop and no friction rub.   No murmur heard. Pulmonary/Chest: Effort normal and breath sounds normal. No stridor. No respiratory distress. She has no rales.  Abdominal: Soft. She exhibits no distension. There is tenderness in the right upper quadrant and epigastric area. There is no rebound, no guarding and negative Murphy's sign.  Musculoskeletal: She exhibits no edema or tenderness.  Neurological: She is alert and oriented to person, place, and time.  Skin: Skin is warm and dry. No rash noted. She is not diaphoretic. No erythema.  Psychiatric: She has a normal mood and affect.  Vitals reviewed.    ED Treatments / Results  Labs (all labs ordered are listed, but only abnormal results are displayed) Labs Reviewed  URINALYSIS, ROUTINE W REFLEX MICROSCOPIC - Abnormal; Notable for the following:       Result Value   Specific Gravity, Urine 1.032 (*)    Hgb urine dipstick LARGE (*)    Ketones, ur 5 (*)    Protein, ur 30 (*)    Bacteria, UA RARE (*)    Squamous Epithelial / LPF 0-5 (*)    All other components within normal limits  LIPASE, BLOOD  COMPREHENSIVE METABOLIC PANEL  CBC  I-STAT BETA HCG BLOOD, ED ([facility], [facility], [facility] ONLY)    EKG  EKG Interpretation None       Radiology Ct Abdomen Pelvis W Contrast  Result Date: 12/11/2016 CLINICAL DATA:  Central abdominal pain beginning last evenin. One episode of emesis. History of Crohn disease and small-bowel obstruction. Last bowel movement was yesterday. EXAM: CT ABDOMEN AND PELVIS WITH CONTRAST TECHNIQUE: Multidetector CT imaging of the abdomen and pelvis was performed using the standard protocol following bolus  administration of intravenous contrast. CONTRAST:  161m ISOVUE-300 IOPAMIDOL (ISOVUE-300) INJECTION 61% COMPARISON:  09/10/2014 FINDINGS: Lower chest: Normal size cardiac chambers.  Clear lung bases. Hepatobiliary: Cholecystectomy with mild reservoir effect accounting for the mild intrahepatic ductal dilatation. This is stable in appearance. No space-occupying mass of the liver. Pancreas: Normal Spleen: Normal Adrenals/Urinary Tract: Normal  Stomach/Bowel: Mild fluid-filled distention of distal jejunum and ileum which may reflect a mild enteritis. There is a short segment of luminal narrowing involving the distal ileum, series 5, image 57 which could potentially represent a short segment stricture given history of Crohn disease however no inflammatory change is apparent. This may simply reflect peristalsis as there are fluid-filled segments of bowel distal to this. Intact suture lines involving the base of cecum and small bowel in the left lower quadrant. No acute bowel inflammation is identified. Vascular/Lymphatic: No significant vascular findings are present. No enlarged abdominal or pelvic lymph nodes. Reproductive: Uterus and left ovary appear unremarkable. There appears to be a right adnexal/ovarian cyst measuring 4.4 x 3.1 x 3.4 cm. Other: Postoperative scarring in the left upper quadrant. Small umbilical fat containing hernia. No abdominopelvic ascites. Musculoskeletal: No acute or significant osseous findings. IMPRESSION: 1. Mild fluid-filled distention of ileal and distal jejunal loops suspicious for an enteritis or possibly mild small bowel ileus. There is a short segment of luminal narrowing without definite inflammation involving the distal ileum which could simply reflect peristaltic change or sequela of Crohn disease. This does not appear to be causing definite mechanical obstruction as there is fluid and mild distention of small bowel distal to this segment. 2. Right adnexal/ovarian simple appearing  cyst measuring 4.4 x 3.1 x 3.4 cm versus partial volume averaging of a fluid-filled segment of bowel. This can be correlated with nonemergent ultrasound. Electronically Signed   By: Ashley Royalty M.D.   On: 12/11/2016 17:22    Procedures Procedures (including critical care time)  Medications Ordered in ED Medications  sodium chloride 0.9 % bolus 1,000 mL (0 mLs Intravenous Stopped 12/11/16 1820)  famotidine (PEPCID) IVPB 20 mg premix (0 mg Intravenous Stopped 12/11/16 1658)  ondansetron (ZOFRAN) injection 4 mg (4 mg Intravenous Given 12/11/16 1602)  morphine 2 MG/ML injection 4 mg (4 mg Intravenous Given 12/11/16 1602)  iopamidol (ISOVUE-300) 61 % injection 100 mL (100 mLs Intravenous Contrast Given 12/11/16 1657)  predniSONE (DELTASONE) tablet 60 mg (60 mg Oral Given 12/11/16 1820)     Initial Impression / Assessment and Plan / ED Course  I have reviewed the triage vital signs and the nursing notes.  Pertinent labs & imaging results that were available during my care of the patient were reviewed by me and considered in my medical decision making (see chart for details).     Work up consistent with early Crohn's flare. Pt endorsing melena, but Hb stable. No obstruction or superimposed infection. Will treat with steroid burst, nausea and pain meds. PCP and GI follow up. The patient is safe for discharge with strict return precautions.   Final Clinical Impressions(s) / ED Diagnoses   Final diagnoses:  Crohn's disease of small intestine with rectal bleeding ([place])   Disposition: Discharge  Condition: Good  I have discussed the results, Dx and Tx plan with the patient who expressed understanding and agree(s) with the plan. Discharge instructions discussed at great length. The patient was given strict return precautions who verbalized understanding of the instructions. No further questions at time of discharge.    Discharge Medication List as of 12/11/2016  5:46 PM    START taking these  medications   Details  dicyclomine (BENTYL) 20 MG tablet Take 1 tablet (20 mg total) by mouth every 6 (six) hours., Starting Thu 12/11/2016, Until Thu 12/18/2016, Print    Hydrocodone-Acetaminophen 2.5-325 MG TABS Take 5 mg by mouth 3 (three) times daily as needed., Starting  Thu 12/11/2016, Until Tue 12/16/2016, Print    ondansetron (ZOFRAN ODT) 4 MG disintegrating tablet Take 1 tablet (4 mg total) by mouth every 8 (eight) hours as needed for nausea or vomiting., Starting Thu 12/11/2016, Until Sun 12/14/2016, Print    PrednisoLONE Sodium Phosphate 25 MG/5ML SOLN 50 mg for 3 days. 56m (8 ml) for 3 days. 30 mg (6 ml) for 3 days. 272m(4 ml) for 3 days. 1072m2 ml) for 3 days. 5mg50m ml) for 3 days., Print        Follow Up: [facility]rie LymeP [facility] [phone] 3-5 days, For close follow up to assess for Crohn's flare      Cardama, PedrGrayce Sessions 12/12/16 [phone]

## 2016-12-16 ENCOUNTER — Other Ambulatory Visit (INDEPENDENT_AMBULATORY_CARE_PROVIDER_SITE_OTHER): Payer: Managed Care, Other (non HMO)

## 2016-12-16 ENCOUNTER — Encounter: Payer: Self-pay | Admitting: Physician Assistant

## 2016-12-16 ENCOUNTER — Ambulatory Visit (INDEPENDENT_AMBULATORY_CARE_PROVIDER_SITE_OTHER): Payer: Managed Care, Other (non HMO) | Admitting: Physician Assistant

## 2016-12-16 VITALS — BP 134/100 | HR 72 | Ht 64.0 in | Wt 216.1 lb

## 2016-12-16 DIAGNOSIS — R935 Abnormal findings on diagnostic imaging of other abdominal regions, including retroperitoneum: Secondary | ICD-10-CM

## 2016-12-16 DIAGNOSIS — R109 Unspecified abdominal pain: Secondary | ICD-10-CM

## 2016-12-16 DIAGNOSIS — K5080 Crohn's disease of both small and large intestine without complications: Secondary | ICD-10-CM

## 2016-12-16 LAB — SEDIMENTATION RATE: Sed Rate: 10 mm/hr (ref 0–20)

## 2016-12-16 LAB — HIGH SENSITIVITY CRP: CRP HIGH SENSITIVITY: 1.68 mg/L (ref 0.000–5.000)

## 2016-12-16 MED ORDER — MERCAPTOPURINE 50 MG PO TABS: ORAL_TABLET | ORAL | 6 refills | Status: DC

## 2016-12-16 MED ORDER — PREDNISONE 20 MG PO TABS: ORAL_TABLET | ORAL | 0 refills | Status: DC

## 2016-12-16 MED ORDER — DICYCLOMINE HCL 20 MG PO TABS: ORAL_TABLET | ORAL | 2 refills | Status: DC

## 2016-12-16 MED ORDER — PROMETHAZINE HCL 25 MG PO TABS: ORAL_TABLET | ORAL | 3 refills | Status: DC

## 2016-12-16 NOTE — Progress Notes (Signed)
Subjective:    Patient ID: [name], female    DOB: May 04, 1991, 26 y.o.   MRN: [id number]  HPI [name] is a pleasant 26 year old African-American female, former patient of Dr. Sydell Axon Brodieestablished with Dr. Silverio Decamp with long history of complicated Crohn's disease diagnosed at age 35. She was initially found to have disease in her duodenum. She is status post remote terminal ilial resection and ileostomy with subsequent reversal. She had a rectal abscess which required I&D in 2014. She has been on biologic therapy with Humira over the past several years in addition to 6-MP at 50 mg daily. She had been doing well over the past few years. Last EGD in 2013 was negative with no evidence of active Crohn's. 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 Patient had an ER visit on 12/11/2016 after she developed acute abdominal pain. She says she had been feeling fine recently and after eating dinner that day developed abdominal cramping and pain which persisted through the night is associated with nausea and vomiting 1. When the pain continued to following day she went on to the emergency room. Labs at that time with CBC and CMET and beta-hCG all unremarkable. CT of the abdomen and pelvis was done showing her to be status post cholecystectomy and with mild fluid-filled distention of the distal jejunum and ileum which may reflect mild enteritis there is a short segment of luminal narrowing involving the distal ileum not rule out short segment stricture though no inflammatory change apparent. Patient states that she stopped taking Humira in October 2017 after her insurance changed and she was unable to afford it as the co-pay had gone up to $90 per month. She has continued on 6-MP 50 mg by mouth daily. She was given a rapid steroid taper at the time of the ER visit A prednisolone solution. She says  she has continued to have some mid abdominal pain and cramping though not as severe as it was on the day of the ER visit. She's not had any further nausea and vomiting, has been keeping by mouth's down. She has had some mild constipation, no bleeding  Review of Systems  Pertinent positive and negative review of systems were noted in the above HPI section.  All other review of systems was otherwise negative.  Outpatient Encounter Prescriptions as of 12/16/2016  Medication Sig  . adalimumab (HUMIRA) 40 MG/0.8ML injection Inject 0.8 mLs (40 mg total) into the skin every 14 (fourteen) days.  Marland Kitchen dicyclomine (BENTYL) 20 MG tablet Take 1 tab by mouth every 6-8 houirs as needed for pain  . Hydrocodone-Acetaminophen 2.5-325 MG TABS Take 5 mg by mouth 3 (three) times daily as needed.  . mercaptopurine (PURINETHOL) 50 MG tablet Take 1 tab every morning.  . PrednisoLONE Sodium Phosphate 25 MG/5ML SOLN 50 mg for 3 days. 34m (8 ml) for 3 days. 30 mg (6 ml) for 3 days. 251m(4 ml) for 3 days. 1035m2 ml) for 3 days. 5mg21m ml) for 3 days.  . promethazine (PHENERGAN) 25 MG tablet Take 1 tab every 6 hours as needed for nausea.  . [DISCONTINUED] dicyclomine (BENTYL) 20 MG tablet Take 1 tablet (20 mg total) by mouth every 6 (six) hours.  . [DISCONTINUED] mercaptopurine (PURINETHOL) 50 MG tablet Take 50 mg by mouth daily. Give on an empty stomach 1 hour before or 2 hours after meals. Caution: Chemotherapy.  . [  DISCONTINUED] promethazine (PHENERGAN) 25 MG tablet Take 1 tablet (25 mg total) by mouth every 6 (six) hours as needed for nausea or vomiting.  . predniSONE (DELTASONE) 20 MG tablet Take 2 tab ( 40 mg) by mouth daily x 14 days, then 1 1/2 tab (30 mg) daily x 14 days, then 1 tab  ( 20 mg) x 14 days, 3/4 tab (15 mg) x 14 days, then 1/2 tab ( 10 mg ) x 14 days.  . [DISCONTINUED] Budesonide (UCERIS) 9 MG TB24 Take 1 tablet by mouth daily.  . [DISCONTINUED] esomeprazole (NEXIUM) 40 MG capsule Take 1 capsule (40 mg  total) by mouth daily at 12 noon. One daily (Patient not taking: Reported on 12/11/2016)   No facility-administered encounter medications on file as of 12/16/2016.    Allergies  Allergen Reactions  . Iron Dextran Anaphylaxis  . Ciprocinonide [Fluocinolone]   . Reglan [Metoclopramide] Other (See Comments)   Patient Active Problem List   Diagnosis Date Noted  . Cesarean delivery delivered 08/16/2013  . B12 deficiency 07/04/2013  . Perirectal abscess 04/21/2013  . Patient currently pregnant 04/21/2013  . Fever 07/23/2012  . Upper abdominal pain 03/12/2012  . Partial small bowel obstruction ([place]) 03/12/2012  . Nausea and vomiting 03/02/2012  . Unspecified asthma(493.90) 03/02/2012  . GERD (gastroesophageal reflux disease) 03/02/2012  . Vaginal candida 02/18/2012  . Abdominal pain 02/17/2012  . Abscess of anal and rectal regions 12/05/2011  . Acute renal failure ([place]) 11/16/2011  . SBO (small bowel obstruction) ([place]) 11/08/2011  . Hypokalemia 11/08/2011  . Anxiety 11/08/2011  . Cellulitis 11/08/2011  . Acute sinus infection 06/27/2011  . Acute bronchitis 06/27/2011  . NAUSEA, CHRONIC 09/06/2010  . ACUTE VENOUS EMBO & THROMB INTRL JUGULAR VEINS 08/31/2010  . GENITAL HERPES 12/19/2008  . UTI'S, RECURRENT 01/25/2008  . ANEMIA, IRON DEFICIENCY, CHRONIC 09/17/2007  . MENORRHAGIA 09/17/2007  . CROHN'S DISEASE 02/29/2004  . ACUT DUOD ULCER W/O MENTION HEMORR PERF/OBST 10/18/2003   Social History   Social History  . Marital status: Single    Spouse name: N/A  . Number of children: 1  . Years of education: 44   Occupational History  . Student Student   Social History Main Topics  . Smoking status: Never Smoker  . Smokeless tobacco: Never Used  . Alcohol use 3.6 oz/week    6 Shots of liquor per week     Comment: occasionally  . Drug use: No  . Sexual activity: Yes    Birth control/ protection: Implant   Other Topics Concern  . Not on file   Social History Narrative    Single, lives mother.   Occasionally drinks a soda.     Ms. Dolder family history includes Diabetes in her cousin and maternal uncle; Emphysema in her maternal grandmother; Heart attack in her maternal grandmother; Heart disease in her maternal grandmother; Sickle cell trait in her father.      Objective:    Vitals:   12/16/16 1032  BP: (!) 134/100  Pulse: 72    Physical Exam  well-developed pleasant young African-American female in no acute distress, blood pressure 134/100 pulse 72, height 5 foot 4 weight 216, BMI 37.1. HEENT; nontraumatic normocephalic EOMI PERRLA sclera anicteric, Cardiovascular; regular rate and rhythm with S1-S2 no murmur or gallop, Pulmonary ;clear bilaterally, Abdomen ;soft, she has some mild tenderness in the lower mid abdomen there is no palpable mass or hepatosplenomegaly no guarding or rebound she does have midline incisional scar and previous ileostomy scar,  Rectal ;exam not done, Extremities ;no clubbing cyanosis or edema skin warm and dry, Neuropsych; mood and affect appropriate       Assessment & Plan:   #84 26 year old African-American female with long-standing complicated Crohn's disease diagnosed at age 38. She has had previous duodenal involvement though none at time of last EGD in 2013 Patient is status post previous terminal ileal resection with ileostomy and ileostomy reversal and is had prior rectal abscesses and rectovaginal fistula. She had been doing well for several years on Humira and 6-MP with no evidence of active Crohn's at last colonoscopy in 2015. Now with acute onset of midabdominal pain cramping nausea and vomiting about a week ago after she had been off of Humira since October 2017. CT scan done during ER visit was without oral contrast and raise possibility of short segment narrowing in the distal ileum. I suspect she is having exacerbation of Crohn's involving the small bowel.  #2 noncompliance with medication due to cost #3  borderline hypertension #4 obesity  Plan; check sedimentation rate and CRP We'll change to prednisone 40 mg by mouth daily 2 weeks then 40 mg by mouth daily 2 weeks, then 20 mg by mouth daily 2 weeks then 15 mg by mouth daily 2 weeks then 10 mg by mouth daily 2 weeks Continue 6-MP at 50 mg by mouth daily Resume Humira 40 mg subcutaneous every 14 days-new prescription will be sent. Emphasized importance of continuing a biologic with patient. 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 Patient will follow up with Dr. Silverio Decamp in one month. She's also advised to call in the interim should she have any worsening of pain or worsening of symptoms as she is tapering steroids.   [name] S Anita Vana PA-C 12/16/2016   Cc: Barrie Lyme, FNP

## 2016-12-16 NOTE — Patient Instructions (Addendum)
Please go to the basement level to have your labs drawn.   We have sent the following medications to your pharmacy for you to pick up at your [facility].   1. Prednisone 20 mg.  2. Bentyl 10 mg 3. 6 MP 4. Phenergan 25 mg 5. Humira will be sent to your mail order.   Prenisone 20 mg Take 2 tablets ( 40 mg ) x 14 days Take 1 12 tab ( 30 mg ) x 14 days Take 1 tab ( 20 mg) x 14 days Take 3/4 tab ( 15 mg ) x 14 days Take 1/2 tab ( 10 mg ) x 14 days.

## 2016-12-17 ENCOUNTER — Telehealth: Payer: Self-pay

## 2016-12-17 NOTE — Telephone Encounter (Signed)
I need the pharmacy benefits information for her Humira.I do not know the pharmacy either. It is usually [facility], [facility].

## 2016-12-20 NOTE — Progress Notes (Signed)
Reviewed and agree with documentation and assessment and plan. K. Veena Nandigam , MD   

## 2016-12-22 NOTE — Telephone Encounter (Signed)
Spoke with the patient. She will fax a copy of her insurance card to me. She has [employer]. She does not have [place] Medicaid.

## 2016-12-30 NOTE — Telephone Encounter (Signed)
Patient says she will bring a copy of her insurance card in the morning.

## 2018-11-01 ENCOUNTER — Encounter: Payer: Self-pay | Admitting: Gastroenterology

## 2018-11-01 ENCOUNTER — Ambulatory Visit (INDEPENDENT_AMBULATORY_CARE_PROVIDER_SITE_OTHER): Payer: Managed Care, Other (non HMO) | Admitting: Gastroenterology

## 2018-11-01 ENCOUNTER — Other Ambulatory Visit: Payer: Self-pay

## 2018-11-01 VITALS — BP 114/86 | HR 97 | Ht 64.0 in | Wt 214.0 lb

## 2018-11-01 DIAGNOSIS — K5080 Crohn's disease of both small and large intestine without complications: Secondary | ICD-10-CM

## 2018-11-01 NOTE — Patient Instructions (Addendum)
If you are age 30 or older, your body mass index should be between 23-30. Your Body mass index is 36.73 kg/m. If this is out of the aforementioned range listed, please consider follow up with your Primary Care Provider.  If you are age 46 or younger, your body mass index should be between 19-25. Your Body mass index is 36.73 kg/m. If this is out of the aformentioned range listed, please consider follow up with your Primary Care Provider.   Your provider has requested that you go to the basement level for lab work. Press "B" on the elevator. The lab is located at the first door on the left as you exit the elevator. PLEASE GO TO LAB THIS WEEK.  CBC, CMP, B12, folate, CRP, iron panel with ferritin.  Recall colonoscopy September 2020 at [facility].  You will be placed on recall for this procedure.  Follow-up office visit in 6 to 12 months.  You will be placed on recall for a follow up appointment with me.  Thank you for choosing me and [facility].   Harl Bowie, MD

## 2018-11-01 NOTE — Progress Notes (Signed)
[name]    [id number]    [birth year]  Primary Care Physician:Bailey, Holli Humbles, FNP  Referring Physician: Barrie Lyme, FNP [address] [place], [place] [address]  This service was provided via audio and video telemedicine (D[employer] video [place]) due to [place] 19 pandemic.  Patient location: Home Provider location: Office Used 2 patient identifiers to confirm the correct person. Explained the limitations in evaluation and management via telemedicine. Patient is aware of potential medical charges for this visit.  Patient consented to this virtual visit.  The persons participating in this telemedicine service were myself and the patient    Chief complaint: Crohn's disease  HPI: 28 year old African-American female with history of Crohn's disease diagnosed at age 68. She discontinued 6-MP and Humira more than a year ago as she was asymptomatic and was also struggling with severe depression and anxiety at the time.  Denies any flare of Crohn's disease in the past few years. She is having regular bowel movement once every day or every other day. Denies any nausea, vomiting, abdominal pain, melena or bright red blood per rectum No loss of appetite or weight loss.  CT abdomen pelvis May 2018 mild fluid-filled distention of distal jejunum and ileum, short segment of luminal narrowing involving the distal ileum which could potentially represent a short segment stricture.  Right adnexal ovarian cyst 4 X 3 cm.  Small umbilical fat-containing hernia   Relevant GI history: Complicated Crohn's disease since age 89. Initially found in the duodenum when she presented with iron deficiency anemia. She is allergic to iron dextran. She underwent terminal ileal resection , ileostomy and takedown of ileostomy several years ago. She has a history of rectal abscess which was drained in October 2014 and 4 /2015. She was on Humira 40 mg every 2 weeks and 6 MP 50 mg  daily.  Upper endoscopy in April 2013 showed no evidence of duodenal Crohn's disease. 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 Surgical [P], terminal ileum, biopsy - BENIGN ILEAL MUCOSA. NO ACTIVE INFLAMMATION OR GRANULOMAS. 2. Surgical [P], anastomosis, biopsy - BENIGN COLONIC MUCOSA. NO ACTIVE INFLAMMATION OR GRANULOMAS. 3. Surgical [P], descending, biopsy - BENIGN COLONIC MUCOSA. NO ACTIVE INFLAMMATION OR GRANULOMAS.  Outpatient Encounter Medications as of 11/01/2018  Medication Sig   custirsen (OGX-011) 160 MG SOLN Inject into the vein.   sertraline (ZOLOFT) 100 MG tablet Take 100 mg by mouth daily.   adalimumab (HUMIRA) 40 MG/0.8ML injection Inject 0.8 mLs (40 mg total) into the skin every 14 (fourteen) days.   dicyclomine (BENTYL) 20 MG tablet Take 1 tab by mouth every 6-8 houirs as needed for pain   mercaptopurine (PURINETHOL) 50 MG tablet Take 1 tab every morning.   PrednisoLONE Sodium Phosphate 25 MG/5ML SOLN 50 mg for 3 days. 29m (8 ml) for 3 days. 30 mg (6 ml) for 3 days. 229m(4 ml) for 3 days. 105m2 ml) for 3 days. 5mg77m ml) for 3 days.   predniSONE (DELTASONE) 20 MG tablet Take 2 tab ( 40 mg) by mouth daily x 14 days, then 1 1/2 tab (30 mg) daily x 14 days, then 1 tab  ( 20 mg) x 14 days, 3/4 tab (15 mg) x 14 days, then  1/2 tab ( 10 mg ) x 14 days.   promethazine (PHENERGAN) 25 MG tablet Take 1 tab every 6 hours as needed for nausea.   No facility-administered encounter medications on file as of 11/01/2018.     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.nausea   Depression 2011   resolved - after 2011 surgery, no problem   DVT (deep venous thrombosis) ([facility]) 08/2010   Left IJ (postop) anticoag x 3 mo   Gastroparesis    GERD (gastroesophageal reflux disease)    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   History of blood transfusion 2011   [place]  2 units transfuses   Hypertension 2011   resolved - after 2011 surgery, no problems   MRSA (methicillin resistant Staphylococcus aureus) 04/2010   multiple abscesses   Ovarian cyst, left    Perianal fistula    Perirectal abscess 04/21/2013   PONV (postoperative nausea and vomiting)    Small bowel obstruction ([place]) 2011   SVT (supraventricular tachycardia) ([facility]) 2011   resolved, after 2011 surgery, no problem   Tremor, anxiety related     Past Surgical History:  Procedure Laterality Date   APPENDECTOMY     CESAREAN SECTION N/A 08/16/2013   Procedure: Primary CESAREAN SECTION;  Surgeon: Betsy Coder, MD;  Location: [facility];  Service: Obstetrics;  Laterality: N/A;   CHOLECYSTECTOMY  2009   closure of ileostomy/loop  08/2010   colectomy with diverting loop ileostomy  2011   COLONOSCOPY  2013   ESOPHAGOGASTRODUODENOSCOPY  11/11/2011   Procedure: ESOPHAGOGASTRODUODENOSCOPY (EGD);  Surgeon: Lafayette Dragon, MD;  Location: [facility];  Service: Endoscopy;  Laterality: N/A;   EXAMINATION UNDER ANESTHESIA  11/12/2011   Procedure: EXAM UNDER ANESTHESIA;  Surgeon: Rolm Bookbinder, MD;  Location: [facility];  Service: General;  Laterality: N/A;   EXAMINATION UNDER ANESTHESIA N/A 04/01/2013    Procedure: EXAM UNDER ANESTHESIA;  Surgeon: Stark Klein, MD;  Location: [facility];  Service: General;  Laterality: N/A;   INCISION AND DRAINAGE PERIRECTAL ABSCESS  11/12/2011   Procedure: IRRIGATION AND DEBRIDEMENT PERIRECTAL ABSCESS;  Surgeon: Rolm Bookbinder, MD;  Location: [facility];  Service: General;  Laterality: N/A;   INCISION AND DRAINAGE PERIRECTAL ABSCESS  12/11/2011   Procedure: IRRIGATION AND DEBRIDEMENT PERIRECTAL ABSCESS;  Surgeon: [name], MD;  Location: [facility];  Service: General;  Laterality: N/A;   INCISION AND DRAINAGE PERIRECTAL ABSCESS N/A 04/01/2013   Procedure: IRRIGATION AND DEBRIDEMENT PERIRECTAL ABSCESS;  Surgeon: Stark Klein, MD;  Location: [facility];  Service: General;  Laterality: N/A;   PROCTOSCOPY  11/12/2011   Procedure: PROCTOSCOPY;  Surgeon: Rolm Bookbinder, MD;  Location: [facility];  Service: General;  Laterality: N/A;    Family History  Problem Relation Age of Onset   Heart attack Maternal Grandmother    Heart disease Maternal Grandmother    Emphysema Maternal Grandmother  Sickle cell trait Father    Diabetes Maternal Uncle    Diabetes Cousin    Colon cancer Neg Hx    Seizures Neg Hx     Social History   Socioeconomic History   Marital status: Single    Spouse name: Not on file   Number of children: 1   Years of education: 15   Highest education level: Not on file  Occupational History   Occupation: Lexicographer: [employer] resource strain: Not on file   Food insecurity:    Worry: Not on file    Inability: Not on file   Transportation needs:    Medical: Not on file    Non-medical: Not on file  Tobacco Use   Smoking status: Never Smoker   Smokeless tobacco: Never Used  Substance and Sexual Activity   Alcohol use: Yes    Alcohol/week: 6.0 standard drinks    Types: 6 Shots of liquor per week    Comment: occasionally   Drug use: No   Sexual activity: Yes    Birth control/protection:  Implant  Lifestyle   Physical activity:    Days per week: Not on file    Minutes per session: Not on file   Stress: Not on file  Relationships   Social connections:    Talks on phone: Not on file    Gets together: Not on file    Attends religious service: Not on file    Active member of club or organization: Not on file    Attends meetings of clubs or organizations: Not on file    Relationship status: Not on file   Intimate partner violence:    Fear of current or ex partner: Not on file    Emotionally abused: Not on file    Physically abused: Not on file    Forced sexual activity: Not on file  Other Topics Concern   Not on file  Social History Narrative   Single, lives mother.   Occasionally drinks a soda.       Review of systems: Review of Systems as per HPI All other systems reviewed and are negative.   Physical Exam: Vitals were not taken and physical exam was not performed during this virtual visit.  Data Reviewed:  Reviewed labs, radiology imaging, old records and pertinent past GI work up   Assessment and Plan/Recommendations:  28 year old female with Crohn's disease diagnosed at age 29 with involvement of duodenum and distal ileum status post terminal ileal resection with ileostomy, subsequent takedown of ileostomy with ileocolonic anastomosis. Previously on Humira and 6-MP, self discontinued in 2019 Currently asymptomatic, will hold off restarting immunosuppressive therapy for now. Check CBC, CMP, folate, B12, CRP and iron panel with ferritin Due for surveillance colonoscopy September 2020 Follow-up office visit in 6 to 12 months or sooner if needed    K. Denzil Magnuson , MD   CC: Barrie Lyme, FNP

## 2018-11-05 ENCOUNTER — Other Ambulatory Visit (INDEPENDENT_AMBULATORY_CARE_PROVIDER_SITE_OTHER): Payer: Managed Care, Other (non HMO)

## 2018-11-05 DIAGNOSIS — K5080 Crohn's disease of both small and large intestine without complications: Secondary | ICD-10-CM

## 2018-11-05 LAB — CBC WITH DIFFERENTIAL/PLATELET
Basophils Absolute: 0 10*3/uL (ref 0.0–0.1)
Basophils Relative: 0.4 % (ref 0.0–3.0)
Eosinophils Absolute: 0.1 10*3/uL (ref 0.0–0.7)
Eosinophils Relative: 1.9 % (ref 0.0–5.0)
HCT: 37.3 % (ref 36.0–46.0)
Hemoglobin: 12.4 g/dL (ref 12.0–15.0)
Lymphocytes Relative: 38.9 % (ref 12.0–46.0)
Lymphs Abs: 2.6 10*3/uL (ref 0.7–4.0)
MCHC: 33.1 g/dL (ref 30.0–36.0)
MCV: 89 fl (ref 78.0–100.0)
Monocytes Absolute: 0.5 10*3/uL (ref 0.1–1.0)
Monocytes Relative: 7.1 % (ref 3.0–12.0)
Neutro Abs: 3.4 10*3/uL (ref 1.4–7.7)
Neutrophils Relative %: 51.7 % (ref 43.0–77.0)
Platelets: 321 10*3/uL (ref 150.0–400.0)
RBC: 4.2 Mil/uL (ref 3.87–5.11)
RDW: 14.9 % (ref 11.5–15.5)
WBC: 6.6 10*3/uL (ref 4.0–10.5)

## 2018-11-05 LAB — COMPREHENSIVE METABOLIC PANEL
ALT: 12 U/L (ref 0–35)
AST: 17 U/L (ref 0–37)
Albumin: 3.9 g/dL (ref 3.5–5.2)
Alkaline Phosphatase: 53 U/L (ref 39–117)
BUN: 11 mg/dL (ref 6–23)
CO2: 29 mEq/L (ref 19–32)
Calcium: 9 mg/dL (ref 8.4–10.5)
Chloride: 104 mEq/L (ref 96–112)
Creatinine, Ser: 0.76 mg/dL (ref 0.40–1.20)
GFR: 108.3 mL/min (ref >60.00)
Glucose, Bld: 112 mg/dL — ABNORMAL HIGH (ref 70–99)
Potassium: 3.5 mEq/L (ref 3.5–5.1)
Sodium: 139 mEq/L (ref 135–145)
Total Bilirubin: 0.3 mg/dL (ref 0.2–1.2)
Total Protein: 7 g/dL (ref 6.0–8.3)

## 2018-11-05 LAB — IBC + FERRITIN
Ferritin: 13.3 ng/mL (ref 10.0–291.0)
Iron: 31 ug/dL — ABNORMAL LOW (ref 42–145)
Saturation Ratios: 6.2 % — ABNORMAL LOW (ref 20.0–50.0)
Transferrin: 359 mg/dL (ref 212.0–360.0)

## 2018-11-05 LAB — HIGH SENSITIVITY CRP: CRP, High Sensitivity: 6.42 mg/L — ABNORMAL HIGH (ref 0.000–5.000)

## 2018-11-05 LAB — FOLATE: Folate: 12.7 ng/mL (ref >5.9)

## 2018-11-05 LAB — VITAMIN B12: Vitamin B-12: 411 pg/mL (ref 211–911)

## 2018-11-23 ENCOUNTER — Other Ambulatory Visit: Payer: Self-pay

## 2018-11-23 ENCOUNTER — Encounter: Payer: Self-pay | Admitting: Gastroenterology

## 2018-11-23 ENCOUNTER — Telehealth: Payer: Self-pay | Admitting: *Deleted

## 2018-11-23 ENCOUNTER — Ambulatory Visit: Payer: Managed Care, Other (non HMO) | Admitting: *Deleted

## 2018-11-23 VITALS — Ht 64.0 in | Wt 214.0 lb

## 2018-11-23 DIAGNOSIS — K5080 Crohn's disease of both small and large intestine without complications: Secondary | ICD-10-CM

## 2018-11-23 MED ORDER — NA SULFATE-K SULFATE-MG SULF 17.5-3.13-1.6 GM/177ML PO SOLN: 1.0000 | Freq: Once | ORAL | 0 refills | Status: AC

## 2018-11-23 NOTE — Telephone Encounter (Signed)
Dr Silverio Decamp,  I did a PV with [name] this afternoon. She is scheduled for a colon with you 5-19 for her Crohn's. She is requesting promethazine to have for her prep. Is that something I can send in for her?  Please advise, Thanks, Lelan Pons pV

## 2018-11-23 NOTE — Progress Notes (Signed)
No egg or soy allergy known to patient  ssues with past sedation with any surgeries  or procedures of PONV and waking during procedures  no intubation problems per pt. No diet pills per patient No home 02 use per patient  No blood thinners per patient  Pt denies issues with constipation  No A fib or A flutter  EMMI video sent to pt's e mail   Pt mailed instruction packet to included paper to complete and mail back to [facility] with addressed and stamped envelope, Emmi video, copy of consent form to read and not return, and instructions. Suprep $15  coupon mailed in packet. PV completed over the phone. Pt encouraged to call with questions or issues    TE to Dr Silverio Decamp about possible promethazine for colon prep per pt. request

## 2018-11-24 NOTE — Telephone Encounter (Signed)
Okay to send prescription for Phenergan 25 mg every 8 hours as needed X 10 tablets. Thanks

## 2018-11-25 MED ORDER — PROMETHAZINE HCL 25 MG PO TABS: 25.0000 mg | ORAL_TABLET | Freq: Three times a day (TID) | ORAL | 0 refills | Status: DC | PRN

## 2018-11-25 NOTE — Telephone Encounter (Signed)
Pt called and made aware medicine was sent to pharmacy and pt given directions to use- instructed her to take one tablet 30-60 minutes before beginning her prep and Q 8 hours as needed - Pt verbalized understanding- Lelan Pons PV

## 2018-11-25 NOTE — Telephone Encounter (Signed)
Phenergan order sent to pharmacy as per Dr Doneen Poisson pV

## 2018-11-28 ENCOUNTER — Telehealth: Payer: Self-pay | Admitting: *Deleted

## 2018-11-28 NOTE — Telephone Encounter (Signed)
Attempted Covid screen.  Left VM

## 2018-11-29 ENCOUNTER — Telehealth: Payer: Self-pay | Admitting: *Deleted

## 2018-11-29 NOTE — Telephone Encounter (Signed)
Covid-19 travel screening questions  Have you traveled in the last 14 days?no If yes where?  Do you now or have you had a fever in the last 14 days?no  Do you have any respiratory symptoms of shortness of breath or cough now or in the last 14 days?no  Do you have a medical history of Congestive Heart Failure?  Do you have a medical history of lung disease?  Do you have any family members or close contacts with diagnosed or suspected Covid-19?no  Pt made aware of care partner policy and was told to bring a mask with her if she has one. SM

## 2018-11-30 ENCOUNTER — Encounter: Payer: Self-pay | Admitting: Gastroenterology

## 2018-11-30 ENCOUNTER — Ambulatory Visit (AMBULATORY_SURGERY_CENTER): Payer: Managed Care, Other (non HMO) | Admitting: Gastroenterology

## 2018-11-30 ENCOUNTER — Other Ambulatory Visit: Payer: Self-pay

## 2018-11-30 VITALS — BP 103/52 | HR 61 | Temp 99.3°F | Resp 24 | Ht 64.0 in | Wt 214.0 lb

## 2018-11-30 DIAGNOSIS — K635 Polyp of colon: Secondary | ICD-10-CM | POA: Diagnosis not present

## 2018-11-30 DIAGNOSIS — K5080 Crohn's disease of both small and large intestine without complications: Secondary | ICD-10-CM | POA: Diagnosis present

## 2018-11-30 DIAGNOSIS — K633 Ulcer of intestine: Secondary | ICD-10-CM

## 2018-11-30 DIAGNOSIS — K648 Other hemorrhoids: Secondary | ICD-10-CM

## 2018-11-30 DIAGNOSIS — D122 Benign neoplasm of ascending colon: Secondary | ICD-10-CM

## 2018-11-30 MED ORDER — SODIUM CHLORIDE 0.9 % IV SOLN: 500.0000 mL | INTRAVENOUS | Status: DC

## 2018-11-30 NOTE — Op Note (Signed)
[facility] Patient Name: [name] Procedure Date: 11/30/2018 12:41 PM MRN: [id number] Endoscopist: Mauri Pole , MD Age: 28 Referring MD:  Date of Birth: 26-Apr-1991 Gender: Female Account #: [id number] Procedure:                Colonoscopy Indications:              Disease activity assessment of Crohn's disease of                            the small bowel and colon. Crohn's flare on                            prednisone taper. Medicines:                Monitored Anesthesia Care Procedure:                Pre-Anesthesia Assessment:                           - Prior to the procedure, a History and Physical                            was performed, and patient medications and                            allergies were reviewed. The patient's tolerance of                            previous anesthesia was also reviewed. The risks                            and benefits of the procedure and the sedation                            options and risks were discussed with the patient.                            All questions were answered, and informed consent                            was obtained. Prior Anticoagulants: The patient has                            taken no previous anticoagulant or antiplatelet                            agents. ASA Grade Assessment: II - A patient with                            mild systemic disease. After reviewing the risks                            and benefits, the patient was deemed in  satisfactory condition to undergo the procedure.                           After obtaining informed consent, the colonoscope                            was passed under direct vision. Throughout the                            procedure, the patient's blood pressure, pulse, and                            oxygen saturations were monitored continuously. The                            Colonoscope was introduced through the  anus and                            advanced to the the ileocolonic anastomosis. The                            colonoscopy was performed without difficulty. The                            patient tolerated the procedure well. The quality                            of the bowel preparation was excellent. The                            terminal ileum, ileocecal valve, appendiceal                            orifice, and rectum were photographed. Scope In: 12:45:36 PM Scope Out: 1:03:15 PM Scope Withdrawal Time: 0 hours 13 minutes 53 seconds  Total Procedure Duration: 0 hours 17 minutes 39 seconds  Findings:                 The perianal and digital rectal examinations were                            normal.                           A 2 mm polyp was found in the ascending colon. The                            polyp was sessile. The polyp was removed with a                            cold biopsy forceps. Resection and retrieval were                            complete.  The distal ileum contained a single (solitary) <52m                            ulcer. No bleeding was present. Biopsies were taken                            with a cold forceps for histology.                           Normal mucosa was found in the left colon and in                            the right colon. Biopsies were taken with a cold                            forceps for histology.                           Non-bleeding internal hemorrhoids were found during                            retroflexion. The hemorrhoids were small. Complications:            No immediate complications. Estimated Blood Loss:     Estimated blood loss was minimal. Impression:               - One 2 mm polyp in the ascending colon, removed                            with a cold biopsy forceps. Resected and retrieved.                           - A single (solitary) ulcer in the distal ileum.                             Biopsied.                           - Normal mucosa in the left colon and in the right                            colon. Biopsied.                           - Non-bleeding internal hemorrhoids. Recommendation:           - Patient has a contact number available for                            emergencies. The signs and symptoms of potential                            delayed complications were discussed with the  patient. Return to normal activities tomorrow.                            Written discharge instructions were provided to the                            patient.                           - Resume previous diet.                           - Continue present medications.                           - Await pathology results.                           - Repeat colonoscopy is recommended. The                            colonoscopy date will be determined after pathology                            results from today's exam become available for                            review.                           - Continue prednisone taper                           - Return to my office (Tele visit) in 2 weeks. Mauri Pole, MD 11/30/2018 1:13:48 PM This report has been signed electronically.

## 2018-11-30 NOTE — Progress Notes (Signed)
PT taken to PACU. Monitors in place. VSS. Report given to RN. 

## 2018-11-30 NOTE — Progress Notes (Signed)
Called to room to assist during endoscopic procedure.  Patient ID and intended procedure confirmed with present staff. Received instructions for my participation in the procedure from the performing physician.  

## 2018-11-30 NOTE — Patient Instructions (Signed)
Handouts given for hemorrhoids and polyps   YOU HAD AN ENDOSCOPIC PROCEDURE TODAY AT [facility]:   Refer to the procedure report that was given to you for any specific questions about what was found during the examination.  If the procedure report does not answer your questions, please call your gastroenterologist to clarify.  If you requested that your care partner not be given the details of your procedure findings, then the procedure report has been included in a sealed envelope for you to review at your convenience later.  YOU SHOULD EXPECT: Some feelings of bloating in the abdomen. Passage of more gas than usual.  Walking can help get rid of the air that was put into your GI tract during the procedure and reduce the bloating. If you had a lower endoscopy (such as a colonoscopy or flexible sigmoidoscopy) you may notice spotting of blood in your stool or on the toilet paper. If you underwent a bowel prep for your procedure, you may not have a normal bowel movement for a few days.  Please Note:  You might notice some irritation and congestion in your nose or some drainage.  This is from the oxygen used during your procedure.  There is no need for concern and it should clear up in a day or so.  SYMPTOMS TO REPORT IMMEDIATELY:   Following lower endoscopy (colonoscopy or flexible sigmoidoscopy):  Excessive amounts of blood in the stool  Significant tenderness or worsening of abdominal pains  Swelling of the abdomen that is new, acute  Fever of 100F or higher  For urgent or emergent issues, a gastroenterologist can be reached at any hour by calling [phone].   DIET:  We do recommend a small meal at first, but then you may proceed to your regular diet.  Drink plenty of fluids but you should avoid alcoholic beverages for 24 hours.  ACTIVITY:  You should plan to take it easy for the rest of today and you should NOT DRIVE or use heavy machinery until tomorrow (because of the  sedation medicines used during the test).    FOLLOW UP: Our staff will call the number listed on your records 48-72 hours following your procedure to check on you and address any questions or concerns that you may have regarding the information given to you following your procedure. If we do not reach you, we will leave a message.  We will attempt to reach you two times.  During this call, we will ask if you have developed any symptoms of COVID 19. If you develop any symptoms (for example fever, flu-like symptoms, shortness of breath, cough etc.) before then, please call [phone].  If any biopsies were taken you will be contacted by phone or by letter within the next 1-3 weeks.  Please call us at [phone] if you have not heard about the biopsies in 3 weeks.    SIGNATURES/CONFIDENTIALITY: You and/or your care partner have signed paperwork which will be entered into your electronic medical record.  These signatures attest to the fact that that the information above on your After Visit Summary has been reviewed and is understood.  Full responsibility of the confidentiality of this discharge information lies with you and/or your care-partner.

## 2018-12-02 ENCOUNTER — Telehealth: Payer: Self-pay | Admitting: *Deleted

## 2018-12-02 NOTE — Telephone Encounter (Signed)
  Follow up Call-  Call back number 11/30/2018  Post procedure Call Back phone  # [phone]  Permission to leave phone message Yes  Some recent data might be hidden     Patient questions:  Do you have a fever, pain , or abdominal swelling? No. Pain Score  0 *  Have you tolerated food without any problems? Yes.    Have you been able to return to your normal activities? Yes.    Do you have any questions about your discharge instructions: Diet   No. Medications  No. Follow up visit  No.  Do you have questions or concerns about your Care? No.  Actions: * If pain score is 4 or above: No action needed, pain <4.   1. Have you developed a fever since your procedure? no  2.   Have you had an respiratory symptoms (SOB or cough) since your procedure? no  3.   Have you tested positive for COVID 19 since your procedure no  4.   Have you had any family members/close contacts diagnosed with the COVID 19 since your procedure?  no   If any of these questions are a yes, please inquire if patient has been seen by family doctor and route this note to Joylene John, Therapist, sports.

## 2018-12-07 ENCOUNTER — Encounter: Payer: Self-pay | Admitting: Gastroenterology

## 2019-12-02 ENCOUNTER — Encounter: Payer: Self-pay | Admitting: Gastroenterology

## 2020-04-10 ENCOUNTER — Ambulatory Visit: Payer: Managed Care, Other (non HMO) | Admitting: Physician Assistant

## 2020-04-11 ENCOUNTER — Other Ambulatory Visit (INDEPENDENT_AMBULATORY_CARE_PROVIDER_SITE_OTHER): Payer: Managed Care, Other (non HMO)

## 2020-04-11 ENCOUNTER — Ambulatory Visit (INDEPENDENT_AMBULATORY_CARE_PROVIDER_SITE_OTHER): Payer: Managed Care, Other (non HMO) | Admitting: Physician Assistant

## 2020-04-11 ENCOUNTER — Encounter: Payer: Self-pay | Admitting: Physician Assistant

## 2020-04-11 VITALS — BP 120/84 | HR 100 | Ht 64.0 in | Wt 216.6 lb

## 2020-04-11 DIAGNOSIS — Z8719 Personal history of other diseases of the digestive system: Secondary | ICD-10-CM

## 2020-04-11 LAB — COMPREHENSIVE METABOLIC PANEL
ALT: 10 U/L (ref 0–35)
AST: 15 U/L (ref 0–37)
Albumin: 4 g/dL (ref 3.5–5.2)
Alkaline Phosphatase: 50 U/L (ref 39–117)
BUN: 7 mg/dL (ref 6–23)
CO2: 29 mEq/L (ref 19–32)
Calcium: 9.2 mg/dL (ref 8.4–10.5)
Chloride: 103 mEq/L (ref 96–112)
Creatinine, Ser: 0.71 mg/dL (ref 0.40–1.20)
GFR: 116.04 mL/min (ref >60.00)
Glucose, Bld: 85 mg/dL (ref 70–99)
Potassium: 4 mEq/L (ref 3.5–5.1)
Sodium: 137 mEq/L (ref 135–145)
Total Bilirubin: 0.3 mg/dL (ref 0.2–1.2)
Total Protein: 7.3 g/dL (ref 6.0–8.3)

## 2020-04-11 LAB — CBC WITH DIFFERENTIAL/PLATELET
Basophils Absolute: 0 10*3/uL (ref 0.0–0.1)
Basophils Relative: 0.4 % (ref 0.0–3.0)
Eosinophils Absolute: 0.1 10*3/uL (ref 0.0–0.7)
Eosinophils Relative: 0.9 % (ref 0.0–5.0)
HCT: 38.2 % (ref 36.0–46.0)
Hemoglobin: 12.7 g/dL (ref 12.0–15.0)
Lymphocytes Relative: 28.5 % (ref 12.0–46.0)
Lymphs Abs: 1.7 10*3/uL (ref 0.7–4.0)
MCHC: 33.3 g/dL (ref 30.0–36.0)
MCV: 89.7 fl (ref 78.0–100.0)
Monocytes Absolute: 0.6 10*3/uL (ref 0.1–1.0)
Monocytes Relative: 10.6 % (ref 3.0–12.0)
Neutro Abs: 3.5 10*3/uL (ref 1.4–7.7)
Neutrophils Relative %: 59.6 % (ref 43.0–77.0)
Platelets: 337 10*3/uL (ref 150.0–400.0)
RBC: 4.26 Mil/uL (ref 3.87–5.11)
RDW: 14.6 % (ref 11.5–15.5)
WBC: 5.9 10*3/uL (ref 4.0–10.5)

## 2020-04-11 LAB — SEDIMENTATION RATE: Sed Rate: 60 mm/hr — ABNORMAL HIGH (ref 0–20)

## 2020-04-11 LAB — C-REACTIVE PROTEIN: CRP: <1 mg/dL (ref 0.5–20.0)

## 2020-04-11 NOTE — Patient Instructions (Signed)
If you are age 31 or older, your body mass index should be between 23-30. Your Body mass index is 37.18 kg/m. If this is out of the aforementioned range listed, please consider follow up with your Primary Care Provider.  If you are age 55 or younger, your body mass index should be between 19-25. Your Body mass index is 37.18 kg/m. If this is out of the aformentioned range listed, please consider follow up with your Primary Care Provider.   Your provider has requested that you go to the basement level for lab work before leaving today. Press "B" on the elevator. The lab is located at the first door on the left as you exit the elevator.  Follow a low gas diet.  Call the office to schedule a follow up with Dr. Silverio Decamp in 6 months.

## 2020-04-11 NOTE — Progress Notes (Signed)
Subjective:    Patient ID: [name], female    DOB: 04-11-1991, 29 y.o.   MRN: [id number]  HPI  [name] is a pleasant 29 year old African-American female, established with Dr. Judithann Graves who comes in today for scheduled follow-up.  She has history of Crohn's ileocolitis initially diagnosed at age 39 and is status post remote terminal ileal resection and ileostomy which was eventually reversed.  She did have duodenal involvement at the time of diagnosis.  She has also had prior history of perirectal abscess, iron deficiency anemia and GERD. She was last seen in the office in April 2020 and underwent colonoscopy in May 2020 which showed a single 1 mm ulcer in the distal ileum, remainder of the colon appeared unremarkable, also noted internal hemorrhoids.  Patient had stopped both 6-MP and Humira in 2019 and has not been on any medication over the past couple of years. She says she has been doing well and having normal bowel movements.  Has not noted any melena or hematochezia.  She does have some issues with increased gassiness and some discomfort from trapped gas at times.  She denies any abdominal pain.  Appetite has been good, weight has been stable.  Energy level has been good.  She says she is not sure if something that she has been eating has been causing increased gas.  She does believe she is lactose intolerant, usually uses Lactaid milk but will eat cheese etc.   Review of Systems Pertinent positive and negative review of systems were noted in the above HPI section.  All other review of systems was otherwise negative.  Outpatient Encounter Medications as of 04/11/2020  Medication Sig  . [DISCONTINUED] dicyclomine (BENTYL) 20 MG tablet Take 1 tab by mouth every 6-8 houirs as needed for pain (Patient not taking: Reported on 11/23/2018)  . [DISCONTINUED] hydrOXYzine (ATARAX/VISTARIL) 25 MG tablet TK 1/2 TO 2 TS PO QHS PRF ITCHING OR ANXIETY  . [DISCONTINUED] mercaptopurine (PURINETHOL) 50  MG tablet Take 1 tab every morning. (Patient not taking: Reported on 11/23/2018)  . [DISCONTINUED] PrednisoLONE Sodium Phosphate 25 MG/5ML SOLN 50 mg for 3 days. 47m (8 ml) for 3 days. 30 mg (6 ml) for 3 days. 263m(4 ml) for 3 days. 1091m2 ml) for 3 days. 5mg27m ml) for 3 days.  . [DISCONTINUED] predniSONE (DELTASONE) 20 MG tablet Take 2 tab ( 40 mg) by mouth daily x 14 days, then 1 1/2 tab (30 mg) daily x 14 days, then 1 tab  ( 20 mg) x 14 days, 3/4 tab (15 mg) x 14 days, then 1/2 tab ( 10 mg ) x 14 days. (Patient not taking: Reported on 11/23/2018)  . [DISCONTINUED] promethazine (PHENERGAN) 25 MG tablet Take 1 tab every 6 hours as needed for nausea. (Patient not taking: Reported on 11/23/2018)  . [DISCONTINUED] promethazine (PHENERGAN) 25 MG tablet Take 1 tablet (25 mg total) by mouth every 8 (eight) hours as needed for nausea or vomiting.  . [DISCONTINUED] sertraline (ZOLOFT) 100 MG tablet Take 100 mg by mouth daily.  . [DISCONTINUED] sertraline (ZOLOFT) 50 MG tablet    No facility-administered encounter medications on file as of 04/11/2020.   Allergies  Allergen Reactions  . Iron Dextran Anaphylaxis  . Ciprocinonide [Fluocinolone]   . Reglan [Metoclopramide] Other (See Comments)   Patient Active Problem List   Diagnosis Date Noted  . Cesarean delivery delivered 08/16/2013  . B12 deficiency 07/04/2013  . Perirectal abscess 04/21/2013  . Patient currently pregnant  04/21/2013  . Fever 07/23/2012  . Upper abdominal pain 03/12/2012  . Partial small bowel obstruction ([place]) 03/12/2012  . Nausea and vomiting 03/02/2012  . Unspecified asthma(493.90) 03/02/2012  . GERD (gastroesophageal reflux disease) 03/02/2012  . Vaginal candida 02/18/2012  . Abdominal pain 02/17/2012  . Abscess of anal and rectal regions 12/05/2011  . Acute renal failure ([place]) 11/16/2011  . SBO (small bowel obstruction) ([place]) 11/08/2011  . Hypokalemia 11/08/2011  . Anxiety 11/08/2011  . Cellulitis 11/08/2011  . Acute  sinus infection 06/27/2011  . Acute bronchitis 06/27/2011  . NAUSEA, CHRONIC 09/06/2010  . ACUTE VENOUS EMBO & THROMB INTRL JUGULAR VEINS 08/31/2010  . GENITAL HERPES 12/19/2008  . UTI'S, RECURRENT 01/25/2008  . ANEMIA, IRON DEFICIENCY, CHRONIC 09/17/2007  . MENORRHAGIA 09/17/2007  . CROHN'S DISEASE 02/29/2004  . ACUT DUOD ULCER W/O MENTION HEMORR PERF/OBST 10/18/2003   Social History   Socioeconomic History  . Marital status: Single    Spouse name: Not on file  . Number of children: 1  . Years of education: 42  . Highest education level: Not on file  Occupational History  . Occupation: Lexicographer: STUDENT  Tobacco Use  . Smoking status: Never Smoker  . Smokeless tobacco: Never Used  Substance and Sexual Activity  . Alcohol use: Yes    Alcohol/week: 6.0 standard drinks    Types: 6 Shots of liquor per week    Comment: occasionally  . Drug use: No  . Sexual activity: Yes    Birth control/protection: Implant  Other Topics Concern  . Not on file  Social History Narrative   Single, lives mother.   Occasionally drinks a soda.    Social Determinants of Health   Financial Resource Strain:   . Difficulty of Paying Living Expenses: Not on file  Food Insecurity:   . Worried About Charity fundraiser in the Last Year: Not on file  . Ran Out of Food in the Last Year: Not on file  Transportation Needs:   . Lack of Transportation (Medical): Not on file  . Lack of Transportation (Non-Medical): Not on file  Physical Activity:   . Days of Exercise per Week: Not on file  . Minutes of Exercise per Session: Not on file  Stress:   . Feeling of Stress : Not on file  Social Connections:   . Frequency of Communication with Friends and Family: Not on file  . Frequency of Social Gatherings with Friends and Family: Not on file  . Attends Religious Services: Not on file  . Active Member of Clubs or Organizations: Not on file  . Attends Archivist Meetings: Not on file   . Marital Status: Not on file  Intimate Partner Violence:   . Fear of Current or Ex-Partner: Not on file  . Emotionally Abused: Not on file  . Physically Abused: Not on file  . Sexually Abused: Not on file    [name]'s family history includes Diabetes in her cousin and maternal uncle; Emphysema in her maternal grandmother; Heart attack in her maternal grandmother; Heart disease in her maternal grandmother; Sickle cell trait in her father.      Objective:    Vitals:   04/11/20 0836  BP: 120/84  Pulse: 100    Physical Exam Well-developed well-nourished young AA female   in no acute distress.  Height, Weight,216 BMI 37.18  HEENT; nontraumatic normocephalic, EOMI, PER R LA, sclera anicteric. Oropharynx;not done Neck; supple, no JVD Cardiovascular; regular rate  and rhythm with S1-S2, no murmur rub or gallop Pulmonary; Clear bilaterally Abdomen; soft, basically nontender nondistended, no palpable mass or hepatosplenomegaly, bowel sounds are active Midline incisional scar Rectal;not done Skin; benign exam, no jaundice rash or appreciable lesions Extremities; no clubbing cyanosis or edema skin warm and dry Neuro/Psych; alert and oriented x4, grossly nonfocal mood and affect appropriate       Assessment & Plan:   #11 29 year old African-American female with long history of Crohn's ileocolitis initially diagnosed at age 69.  She had duodenal involvement at the time of diagnosis, she is status post remote terminal ileal resection and ileostomy which was eventually reversed. Patient has been relatively asymptomatic over the past couple of years and has not been on any medication since 2019. Most recent colonoscopy May 2020 with finding of only 1 tiny ulcer in the distal ileum. She continues to do well though has noted some increased gassiness recently.  No current complaints of abdominal pain or cramping, no diarrhea or changes in bowel habits  #2 GERD stable, not requiring any  medication  Plan; CBC with differential, c-Met, sed rate, CRP Low gas diet, patient also advised to minimize lactose intake. Continue observation for now, she will follow-up with Dr. Silverio Decamp.  She was asked to monitor for any progression of gassiness bloating lower abdominal pain etc. to suggest active disease.  She knows to call should she develop any issues in the next few months.   S  PA-C 04/11/2020   Cc: Barrie Lyme, FNP

## 2021-01-15 ENCOUNTER — Other Ambulatory Visit: Payer: Self-pay | Admitting: Obstetrics and Gynecology

## 2021-01-15 DIAGNOSIS — N979 Female infertility, unspecified: Secondary | ICD-10-CM

## 2021-01-21 ENCOUNTER — Other Ambulatory Visit: Payer: Managed Care, Other (non HMO)

## 2021-01-24 ENCOUNTER — Ambulatory Visit
Admission: RE | Admit: 2021-01-24 | Discharge: 2021-01-24 | Disposition: A | Discharge status: Home / Self Care | Payer: BC Managed Care – PPO | Source: Ambulatory Visit | Attending: Obstetrics and Gynecology | Admitting: Obstetrics and Gynecology

## 2021-01-24 ENCOUNTER — Other Ambulatory Visit: Payer: Self-pay

## 2021-01-24 DIAGNOSIS — N979 Female infertility, unspecified: Secondary | ICD-10-CM

## 2021-03-13 ENCOUNTER — Emergency Department (HOSPITAL_COMMUNITY): Payer: BC Managed Care – PPO

## 2021-03-13 ENCOUNTER — Encounter (HOSPITAL_COMMUNITY): Payer: Self-pay | Admitting: *Deleted

## 2021-03-13 ENCOUNTER — Other Ambulatory Visit: Payer: Self-pay

## 2021-03-13 ENCOUNTER — Emergency Department (HOSPITAL_COMMUNITY)
Admission: EM | Admit: 2021-03-13 | Discharge: 2021-03-13 | Disposition: A | Discharge status: Home / Self Care | Payer: BC Managed Care – PPO | Attending: Emergency Medicine | Admitting: Emergency Medicine

## 2021-03-13 DIAGNOSIS — K219 Gastro-esophageal reflux disease without esophagitis: Secondary | ICD-10-CM | POA: Insufficient documentation

## 2021-03-13 DIAGNOSIS — I10 Essential (primary) hypertension: Secondary | ICD-10-CM | POA: Insufficient documentation

## 2021-03-13 DIAGNOSIS — R110 Nausea: Secondary | ICD-10-CM

## 2021-03-13 DIAGNOSIS — N9489 Other specified conditions associated with female genital organs and menstrual cycle: Secondary | ICD-10-CM | POA: Diagnosis not present

## 2021-03-13 DIAGNOSIS — R1033 Periumbilical pain: Secondary | ICD-10-CM | POA: Diagnosis present

## 2021-03-13 DIAGNOSIS — R1013 Epigastric pain: Secondary | ICD-10-CM

## 2021-03-13 DIAGNOSIS — R933 Abnormal findings on diagnostic imaging of other parts of digestive tract: Secondary | ICD-10-CM | POA: Diagnosis not present

## 2021-03-13 DIAGNOSIS — K5000 Crohn's disease of small intestine without complications: Secondary | ICD-10-CM | POA: Diagnosis not present

## 2021-03-13 LAB — URINALYSIS, ROUTINE W REFLEX MICROSCOPIC
Bilirubin Urine: NEGATIVE
Glucose, UA: NEGATIVE mg/dL
Ketones, ur: NEGATIVE mg/dL
Leukocytes,Ua: NEGATIVE
Nitrite: NEGATIVE
Protein, ur: NEGATIVE mg/dL
Specific Gravity, Urine: 1.015 (ref 1.005–1.030)
pH: 6 (ref 5.0–8.0)

## 2021-03-13 LAB — CBC
HCT: 40.6 % (ref 36.0–46.0)
Hemoglobin: 13 g/dL (ref 12.0–15.0)
MCH: 28.3 pg (ref 26.0–34.0)
MCHC: 32 g/dL (ref 30.0–36.0)
MCV: 88.5 fL (ref 80.0–100.0)
Platelets: 400 10*3/uL (ref 150–400)
RBC: 4.59 MIL/uL (ref 3.87–5.11)
RDW: 15.2 % (ref 11.5–15.5)
WBC: 7.9 10*3/uL (ref 4.0–10.5)
nRBC: 0 % (ref 0.0–0.2)

## 2021-03-13 LAB — COMPREHENSIVE METABOLIC PANEL
ALT: 13 U/L (ref 0–44)
AST: 19 U/L (ref 15–41)
Albumin: 3.6 g/dL (ref 3.5–5.0)
Alkaline Phosphatase: 45 U/L (ref 38–126)
Anion gap: 7 (ref 5–15)
BUN: 6 mg/dL (ref 6–20)
CO2: 25 mmol/L (ref 22–32)
Calcium: 9.3 mg/dL (ref 8.9–10.3)
Chloride: 111 mmol/L (ref 98–111)
Creatinine, Ser: 0.62 mg/dL (ref 0.44–1.00)
GFR, Estimated: >60 mL/min (ref >60)
Glucose, Bld: 97 mg/dL (ref 70–99)
Potassium: 3.6 mmol/L (ref 3.5–5.1)
Sodium: 143 mmol/L (ref 135–145)
Total Bilirubin: 0.3 mg/dL (ref 0.3–1.2)
Total Protein: 7.2 g/dL (ref 6.5–8.1)

## 2021-03-13 LAB — I-STAT BETA HCG BLOOD, ED (MC, WL, AP ONLY): I-stat hCG, quantitative: <5 m[IU]/mL (ref <5)

## 2021-03-13 LAB — LIPASE, BLOOD: Lipase: 24 U/L (ref 11–51)

## 2021-03-13 MED ORDER — METHYLPREDNISOLONE 4 MG PO TBPK: ORAL_TABLET | ORAL | 0 refills | Status: DC

## 2021-03-13 MED ORDER — PANTOPRAZOLE SODIUM 40 MG PO TBEC: 40.0000 mg | DELAYED_RELEASE_TABLET | Freq: Every day | ORAL | 0 refills | Status: DC

## 2021-03-13 MED ORDER — METHYLPREDNISOLONE SODIUM SUCC 125 MG IJ SOLR
125.0000 mg | Freq: Once | INTRAMUSCULAR | Status: AC
  Administered 2021-03-13: 125 mg via INTRAVENOUS
  Filled 2021-03-13: qty 2

## 2021-03-13 MED ORDER — PROMETHAZINE HCL 25 MG PO TABS
25.0000 mg | ORAL_TABLET | Freq: Once | ORAL | Status: AC
  Administered 2021-03-13: 25 mg via ORAL
  Filled 2021-03-13: qty 1

## 2021-03-13 MED ORDER — ONDANSETRON HCL 4 MG/2ML IJ SOLN
4.0000 mg | Freq: Once | INTRAMUSCULAR | Status: AC
  Administered 2021-03-13: 4 mg via INTRAVENOUS
  Filled 2021-03-13: qty 2

## 2021-03-13 MED ORDER — IOHEXOL 350 MG/ML SOLN
100.0000 mL | Freq: Once | INTRAVENOUS | Status: AC | PRN
  Administered 2021-03-13: 80 mL via INTRAVENOUS

## 2021-03-13 MED ORDER — MORPHINE SULFATE (PF) 4 MG/ML IV SOLN
4.0000 mg | Freq: Once | INTRAVENOUS | Status: AC
  Administered 2021-03-13: 4 mg via INTRAVENOUS
  Filled 2021-03-13: qty 1

## 2021-03-13 MED ORDER — OXYCODONE-ACETAMINOPHEN 5-325 MG PO TABS: 1.0000 | ORAL_TABLET | Freq: Four times a day (QID) | ORAL | 0 refills | Status: DC | PRN

## 2021-03-13 MED ORDER — MORPHINE SULFATE (PF) 2 MG/ML IV SOLN
2.0000 mg | Freq: Once | INTRAVENOUS | Status: AC
  Administered 2021-03-13: 2 mg via INTRAVENOUS
  Filled 2021-03-13: qty 1

## 2021-03-13 MED ORDER — SODIUM CHLORIDE 0.9 % IV BOLUS
1000.0000 mL | Freq: Once | INTRAVENOUS | Status: AC
  Administered 2021-03-13: 1000 mL via INTRAVENOUS

## 2021-03-13 MED ORDER — SODIUM CHLORIDE 0.9 % IV SOLN
12.5000 mg | Freq: Four times a day (QID) | INTRAVENOUS | Status: DC | PRN
  Administered 2021-03-13: 12.5 mg via INTRAVENOUS
  Filled 2021-03-13: qty 12.5

## 2021-03-13 NOTE — ED Provider Notes (Signed)
[facility] DEPT Provider Note   CSN: [id number] Arrival date & time: 03/13/21  7001     History Chief Complaint  Patient presents with   Abdominal Pain    [name] is a 30 y.o. female.  30 y.o female with a PMH of ARF,Chrons, SVT presents to the ED with a chief complaint of abdominal pain x yesterday.  Patient described the sensation as periumbilical pain stabbing in nature which comes and goes.  Somewhat exacerbated with movement.  Had an episode this morning, where she felt like she was unable to even take a step as the pain was debilitating.  Does have a prior history of bowel obstruction, approximately 10 years ago.  She is currently followed for her history of Crohn's by Dr. Silverio Decamp of [facility], last visit with the group approximately a year ago.  Only not on any daily regimen for her Crohn's disease.  Last oral intake was yesterday.  She is also endorsing some nausea but no vomiting.  Last bowel movement was this morning and normal. LMP 03/09/2021 . She denies any vomiting, fever, diarrhea. No vaginal bleeding, vaginal discharge, urinary symptoms.      The history is provided by the patient and medical records.  Abdominal Pain Pain location:  Periumbilical Pain quality: stabbing   Pain radiates to:  Does not radiate Pain severity:  Severe Onset quality:  Gradual Duration:  1 day Timing:  Intermittent Chronicity:  New Context: alcohol use   Context: not laxative use, not recent illness and not sick contacts   Relieved by:  Nothing Worsened by:  Movement Ineffective treatments:  Acetaminophen and NSAIDs Associated symptoms: nausea   Associated symptoms: no chest pain, no chills, no diarrhea, no fever, no shortness of breath, no sore throat and no vomiting       Past Medical History:  Diagnosis Date   Acute renal failure (HCC)    Allergy    Anemia    iron def   Anxiety 2011   resolved - after 2011 surgery, no problem    Blood transfusion    Blood transfusion without reported diagnosis    Candida esophagitis ([place])    Clotting disorder ([place])    hx DVT    Crohn's disease ([place]) dx 2004   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.nausea   Depression 2011   resolved - after 2011 surgery, no problem   DVT (deep venous thrombosis) (HCC) 08/2010   Left IJ (postop) anticoag x 3 mo   Gastroparesis    GERD (gastroesophageal reflux disease)    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   History of blood transfusion 2011   Leach  2 units transfuses   Hypertension 2011   resolved - after 2011 surgery, no problems   MRSA (methicillin resistant Staphylococcus aureus) 04/2010   multiple abscesses   Ovarian cyst, left    Perianal fistula    Perirectal abscess 04/21/2013   PONV (postoperative nausea and vomiting)    Small bowel obstruction ([place]) 2011   SVT (supraventricular tachycardia) ([place]) 2011   resolved, after 2011 surgery, no problem   Tremor, anxiety related     Patient Active Problem List   Diagnosis Date Noted   Cesarean delivery delivered 08/16/2013   B12 deficiency 07/04/2013   Perirectal abscess 04/21/2013   Patient currently pregnant 04/21/2013   Fever 07/23/2012   Upper abdominal pain 03/12/2012   Partial small bowel obstruction ([place]) 03/12/2012  Nausea and vomiting 03/02/2012   Unspecified asthma(493.90) 03/02/2012   GERD (gastroesophageal reflux disease) 03/02/2012   Vaginal candida 02/18/2012   Abdominal pain 02/17/2012   Abscess of anal and rectal regions 12/05/2011   Acute renal failure ([place]) 11/16/2011   SBO (small bowel obstruction) ([place]) 11/08/2011   Hypokalemia 11/08/2011   Anxiety 11/08/2011   Cellulitis 11/08/2011   Acute sinus infection 06/27/2011   Acute bronchitis 06/27/2011   NAUSEA, CHRONIC 09/06/2010   ACUTE VENOUS EMBO & THROMB INTRL JUGULAR VEINS 08/31/2010   GENITAL HERPES 12/19/2008   UTI'S, RECURRENT 01/25/2008   ANEMIA, IRON  DEFICIENCY, CHRONIC 09/17/2007   MENORRHAGIA 09/17/2007   CROHN'S DISEASE 02/29/2004   ACUT DUOD ULCER W/O MENTION HEMORR PERF/OBST 10/18/2003    Past Surgical History:  Procedure Laterality Date   APPENDECTOMY     CESAREAN SECTION N/A 08/16/2013   Procedure: Primary CESAREAN SECTION;  Surgeon: Betsy Coder, MD;  Location: [facility];  Service: Obstetrics;  Laterality: N/A;   CHOLECYSTECTOMY  2009   closure of ileostomy/loop  08/2010   colectomy with diverting loop ileostomy  2011   COLONOSCOPY  2013   ESOPHAGOGASTRODUODENOSCOPY  11/11/2011   Procedure: ESOPHAGOGASTRODUODENOSCOPY (EGD);  Surgeon: Lafayette Dragon, MD;  Location: [facility];  Service: Endoscopy;  Laterality: N/A;   EXAMINATION UNDER ANESTHESIA  11/12/2011   Procedure: EXAM UNDER ANESTHESIA;  Surgeon: Rolm Bookbinder, MD;  Location: [facility];  Service: General;  Laterality: N/A;   EXAMINATION UNDER ANESTHESIA N/A 04/01/2013   Procedure: EXAM UNDER ANESTHESIA;  Surgeon: Stark Klein, MD;  Location: [facility];  Service: General;  Laterality: N/A;   INCISION AND DRAINAGE PERIRECTAL ABSCESS  11/12/2011   Procedure: IRRIGATION AND DEBRIDEMENT PERIRECTAL ABSCESS;  Surgeon: Rolm Bookbinder, MD;  Location: [facility];  Service: General;  Laterality: N/A;   INCISION AND DRAINAGE PERIRECTAL ABSCESS  12/11/2011   Procedure: IRRIGATION AND DEBRIDEMENT PERIRECTAL ABSCESS;  Surgeon: [name], MD;  Location: [facility];  Service: General;  Laterality: N/A;   INCISION AND DRAINAGE PERIRECTAL ABSCESS N/A 04/01/2013   Procedure: IRRIGATION AND DEBRIDEMENT PERIRECTAL ABSCESS;  Surgeon: Stark Klein, MD;  Location: [facility];  Service: General;  Laterality: N/A;   PROCTOSCOPY  11/12/2011   Procedure: PROCTOSCOPY;  Surgeon: Rolm Bookbinder, MD;  Location: [facility];  Service: General;  Laterality: N/A;   UPPER GASTROINTESTINAL ENDOSCOPY       OB History     Gravida  1   Para  1   Term  1   Preterm      AB      Living  1      SAB      IAB       Ectopic      Multiple      Live Births  1           Family History  Problem Relation Age of Onset   Heart attack Maternal Grandmother    Heart disease Maternal Grandmother    Emphysema Maternal Grandmother    Sickle cell trait Father    Diabetes Maternal Uncle    Diabetes Cousin    Colon cancer Neg Hx    Seizures Neg Hx    Colon polyps Neg Hx    Esophageal cancer Neg Hx    Rectal cancer Neg Hx    Stomach cancer Neg Hx     Social History   Tobacco Use   Smoking status: Never   Smokeless tobacco: Never  Substance Use Topics  Alcohol use: Yes    Alcohol/week: 6.0 standard drinks    Types: 6 Shots of liquor per week    Comment: occasionally   Drug use: No    Home Medications Prior to Admission medications   Medication Sig Start Date End Date Taking? Authorizing Provider  acetaminophen (TYLENOL) 500 MG tablet Take 1,000 mg by mouth every 6 (six) hours as needed for mild pain or headache.   Yes [provider]  ELDERBERRY PO Take 1 tablet by mouth daily.   Yes [provider]  ibuprofen (ADVIL) 200 MG tablet Take 400 mg by mouth every 6 (six) hours as needed for mild pain.   Yes [provider]  Multiple Vitamin (MULTIVITAMIN) tablet Take 1 tablet by mouth daily.   Yes [provider]  Multiple Vitamins-Minerals (AIRBORNE PO) Take 1 tablet by mouth daily.   Yes [provider]    Allergies    Iron dextran, Ciprocinonide [fluocinolone], and Reglan [metoclopramide]  Review of Systems   Review of Systems  Constitutional:  Negative for chills and fever.  HENT:  Negative for sore throat.   Respiratory:  Negative for shortness of breath.   Cardiovascular:  Negative for chest pain.  Gastrointestinal:  Positive for abdominal pain and nausea. Negative for diarrhea and vomiting.  Genitourinary:  Negative for flank pain.  Musculoskeletal:  Negative for back pain.  Skin:  Negative for pallor and wound.  Neurological:  Negative  for light-headedness and headaches.  All other systems reviewed and are negative.  Physical Exam Updated Vital Signs BP (!) 138/97   Pulse (!) 57   Temp 98 F (36.7 C) (Oral)   Resp 16   Ht 5' 4"  (1.626 m)   Wt 95.3 kg   LMP 03/09/2021   SpO2 97%   BMI 36.05 kg/m   Physical Exam Vitals and nursing note reviewed.  Constitutional:      Appearance: She is well-developed. She is obese. She is not ill-appearing.  HENT:     Head: Normocephalic and atraumatic.     Mouth/Throat:     Mouth: Mucous membranes are moist.  Cardiovascular:     Rate and Rhythm: Normal rate.  Pulmonary:     Effort: Pulmonary effort is normal.     Breath sounds: No wheezing.  Abdominal:     General: Abdomen is flat. A surgical scar is present. Bowel sounds are decreased. There is distension.     Palpations: Abdomen is soft. There is no shifting dullness.     Tenderness: There is abdominal tenderness in the periumbilical area. There is guarding. There is no right CVA tenderness or left CVA tenderness.  Skin:    General: Skin is warm and dry.  Neurological:     Mental Status: She is alert.    ED Results / Procedures / Treatments   Labs (all labs ordered are listed, but only abnormal results are displayed) Labs Reviewed  URINALYSIS, ROUTINE W REFLEX MICROSCOPIC - Abnormal; Notable for the following components:      Result Value   Hgb urine dipstick LARGE (*)    Bacteria, UA RARE (*)    All other components within normal limits  CBC  COMPREHENSIVE METABOLIC PANEL  LIPASE, BLOOD  I-STAT BETA HCG BLOOD, ED ([facility], [facility], [facility] ONLY)    EKG None  Radiology CT ABDOMEN PELVIS W CONTRAST  Result Date: 03/13/2021 CLINICAL DATA:  Mid abdominal pain, intermittent, history of Crohn's disease, suspect small bowel obstruction EXAM: CT ABDOMEN AND PELVIS WITH  CONTRAST TECHNIQUE: Multidetector CT imaging of the abdomen and pelvis was performed using the standard protocol following bolus administration of intravenous  contrast. CONTRAST:  51m OMNIPAQUE IOHEXOL 350 MG/ML SOLN COMPARISON:  12/11/2016 FINDINGS: Lower chest: No acute abnormality. Hepatobiliary: No focal liver abnormality is seen. Status post cholecystectomy. No biliary dilatation. Pancreas: Unremarkable. No pancreatic ductal dilatation or surrounding inflammatory changes. Spleen: Normal in size without significant abnormality. Adrenals/Urinary Tract: Adrenal glands are unremarkable. Kidneys are normal, without renal calculi, solid lesion, or hydronephrosis. Bladder is unremarkable. Stomach/Bowel: Stomach is within normal limits. Status post mid small bowel resection and terminal ileocolectomy and ileocolic reanastomosis. There is diffuse inflammatory wall thickening of the colon, most conspicuously involving the ascending and transverse colon (series 2, image 30). Vascular/Lymphatic: No significant vascular findings are present. No enlarged abdominal or pelvic lymph nodes. Reproductive: No mass or other significant abnormality. Other: No abdominal wall hernia or abnormality. No abdominopelvic ascites. Musculoskeletal: No acute or significant osseous findings. IMPRESSION: 1. Diffuse inflammatory wall thickening of the colon, most conspicuously involving the ascending and transverse colon, findings consistent with infectious or inflammatory colitis, and in keeping with history of Crohn's. 2. Status post mid small bowel resection and terminal ileocolectomy. 3. No evidence of bowel obstruction at this time. Electronically Signed   By: AEddie CandleM.D.   On: 03/13/2021 13:02    Procedures Procedures   Medications Ordered in ED Medications  sodium chloride 0.9 % bolus 1,000 mL (1,000 mLs Intravenous Bolus 03/13/21 0826)  morphine 2 MG/ML injection 2 mg (2 mg Intravenous Given 03/13/21 0826)  ondansetron (ZOFRAN) injection 4 mg (4 mg Intravenous Given 03/13/21 0825)  morphine 4 MG/ML injection 4 mg (4 mg Intravenous Given 03/13/21 1025)  iohexol (OMNIPAQUE) 350  MG/ML injection 100 mL (80 mLs Intravenous Contrast Given 03/13/21 1205)  morphine 4 MG/ML injection 4 mg (4 mg Intravenous Given 03/13/21 1320)  ondansetron (ZOFRAN) injection 4 mg (4 mg Intravenous Given 03/13/21 1508)    ED Course  I have reviewed the triage vital signs and the nursing notes.  Pertinent labs & imaging results that were available during my care of the patient were reviewed by me and considered in my medical decision making (see chart for details).    MDM Rules/Calculators/A&P  Patient with a past medical history of Crohn's presents to the ED with a chief complaint of periumbilical abdominal pain that began yesterday.  This waxes and wanes, described as sharp sensations to her umbilicus.  Similar symptomology with a prior episode of bowel obstruction, approximately 10 years ago.  [facility], last visit with them about a year ago.  During primary evaluation patient is nontoxic-appearing, abdomen is tender to palpation, she is teary-eyed on exam with some guarding present and decreased bowel sounds.  No CVA tenderness, lungs are clear to auscultation the rest of her exam is benign.  She denies any vaginal discharge, vaginal bleeding, urinary symptoms.  Provided with morphine, Zofran, bolus to help with symptomatic control.  She is currently on no medication for her Crohn's at this time, she did have a prior history of colostomy, which was then reversed.  Normal bowel movement this morning without any blood in her stool.  CT Abdomen and pelvis showed: 1. Diffuse inflammatory wall thickening of the colon, most  conspicuously involving the ascending and transverse colon, findings  consistent with infectious or inflammatory colitis, and in keeping  with history of Crohn's.  2. Status post mid small bowel resection  and terminal ileocolectomy.  3. No evidence of bowel obstruction at this time.      A copy of these results were given to patient.   She does have an appointment scheduled with her gastroenterologist in the upcoming month.  She did try to obtain a sooner appointment.  I will personally call [facility] on-call in order to obtain further recommendations.  2:01 PM Spoke to [facility] gastroenterology who will review patient's chart along with evaluate patient while in the ED.   Patient was informed on being evaluated by [facility].  She received Zofran.  Patient care signed out to incoming provider for further management.    Portions of this note were generated with [employer]. Dictation errors may occur despite best attempts at proofreading.  Final Clinical Impression(s) / ED Diagnoses Final diagnoses:  Periumbilical abdominal pain    Rx / DC Orders ED Discharge Orders     None        Janeece Fitting, PA-C 03/13/21 1518    Regan Lemming, MD 03/13/21 1719

## 2021-03-13 NOTE — ED Triage Notes (Signed)
Pt complains of midabdominal pain , on and off for past month, worse since yesterday. Hx of crohn's. She feels like she has small bowel obstruction, which she had 10 years ago. No urinary symptoms.

## 2021-03-13 NOTE — Consult Note (Signed)
[facility] Consult Note   History AMELIANA BRASHEAR MRN # [id number]  Date of Admission: 03/13/2021 Date of Consultation: 03/13/2021 Referring physician: Dr. Rayne Du att. providers found Primary Care Provider: Barrie Lyme, FNP Primary Gastroenterologist: Dr. Silverio Decamp   Reason for Consultation/Chief Complaint: Abdominal pain and nausea  Subjective  HPI:  This is a 30 year old woman known to Dr. Silverio Decamp for abdominal pain, nausea and history of Crohn's disease.  She has longstanding Crohn's disease with prior surgeries, and last seen in our office September 2021.  Minimal Crohn's activity on last colonoscopy in 2020, and when last seen in 2021 seem to have no active symptoms.  She has been on no treatment since 2019, no treatment resumed at last visit with [place]. She came to the ED today because for the last few weeks she has had intermittent nausea and epigastric pain.  It is no clear triggers or relieving factors.  Symptoms were worse today, she called our office and was unable to get an immediate appointment and thus came to the ED. She had some Zofran earlier without much improvement.  She typically has several BMs per day but they are not loose and has been no change in that recently and no lower GI bleeding. Johniya feels as if today's symptoms are similar to when she has had Crohn's flares in the past, particularly when she had bowel obstruction. Recent COVID illness, and she initially thought the symptoms were secondary to that, but they have persisted intermittently over the last few weeks and worse today.  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.nausea   Depression 2011   resolved - after 2011 surgery, no problem   DVT (deep venous thrombosis) (HCC) 08/2010   Left IJ (postop) anticoag x 3 mo   Gastroparesis    GERD (gastroesophageal reflux disease)    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   History of blood transfusion 2011   [place]  2 units transfuses   Hypertension 2011   resolved - after 2011 surgery, no problems   MRSA (methicillin resistant Staphylococcus aureus) 04/2010   multiple abscesses   Ovarian cyst, left    Perianal fistula    Perirectal abscess 04/21/2013   PONV (postoperative nausea and vomiting)    Small bowel obstruction ([place]) 2011   SVT (supraventricular tachycardia) ([place]) 2011   resolved, after 2011 surgery, no problem   Tremor, anxiety related     Past Surgical History Past Surgical History:  Procedure Laterality Date   APPENDECTOMY     CESAREAN SECTION N/A 08/16/2013   Procedure: Primary CESAREAN SECTION;  Surgeon: Betsy Coder, MD;  Location: [facility];  Service: Obstetrics;  Laterality: N/A;   CHOLECYSTECTOMY  2009   closure of ileostomy/loop  08/2010   colectomy with diverting loop ileostomy  2011  COLONOSCOPY  2013   ESOPHAGOGASTRODUODENOSCOPY  11/11/2011   Procedure: ESOPHAGOGASTRODUODENOSCOPY (EGD);  Surgeon: Lafayette Dragon, MD;  Location: [facility];  Service: Endoscopy;  Laterality: N/A;   EXAMINATION UNDER ANESTHESIA  11/12/2011   Procedure: EXAM UNDER ANESTHESIA;  Surgeon: Rolm Bookbinder, MD;  Location: [facility];  Service: General;  Laterality: N/A;   EXAMINATION UNDER ANESTHESIA N/A 04/01/2013   Procedure: EXAM UNDER ANESTHESIA;  Surgeon: Stark Klein, MD;  Location: [facility];  Service: General;  Laterality: N/A;   INCISION AND DRAINAGE PERIRECTAL  ABSCESS  11/12/2011   Procedure: IRRIGATION AND DEBRIDEMENT PERIRECTAL ABSCESS;  Surgeon: Rolm Bookbinder, MD;  Location: [facility];  Service: General;  Laterality: N/A;   INCISION AND DRAINAGE PERIRECTAL ABSCESS  12/11/2011   Procedure: IRRIGATION AND DEBRIDEMENT PERIRECTAL ABSCESS;  Surgeon: [name], MD;  Location: [facility];  Service: General;  Laterality: N/A;   INCISION AND DRAINAGE PERIRECTAL ABSCESS N/A 04/01/2013   Procedure: IRRIGATION AND DEBRIDEMENT PERIRECTAL ABSCESS;  Surgeon: Stark Klein, MD;  Location: [facility];  Service: General;  Laterality: N/A;   PROCTOSCOPY  11/12/2011   Procedure: PROCTOSCOPY;  Surgeon: Rolm Bookbinder, MD;  Location: [facility];  Service: General;  Laterality: N/A;   UPPER GASTROINTESTINAL ENDOSCOPY      Family History Family History  Problem Relation Age of Onset   Heart attack Maternal Grandmother    Heart disease Maternal Grandmother    Emphysema Maternal Grandmother    Sickle cell trait Father    Diabetes Maternal Uncle    Diabetes Cousin    Colon cancer Neg Hx    Seizures Neg Hx    Colon polyps Neg Hx    Esophageal cancer Neg Hx    Rectal cancer Neg Hx    Stomach cancer Neg Hx     Social History Social History   Socioeconomic History   Marital status: Single    Spouse name: Not on file   Number of children: 1   Years of education: 15   Highest education level: Not on file  Occupational History   Occupation: Lexicographer: STUDENT  Tobacco Use   Smoking status: Never   Smokeless tobacco: Never  Substance and Sexual Activity   Alcohol use: Yes    Alcohol/week: 6.0 standard drinks    Types: 6 Shots of liquor per week    Comment: occasionally   Drug use: No   Sexual activity: Yes    Birth control/protection: Implant  Other Topics Concern   Not on file  Social History Narrative   Single, lives mother.   Occasionally drinks a soda.    Social Determinants of Health   Financial Resource Strain: Not on file  Food  Insecurity: Not on file  Transportation Needs: Not on file  Physical Activity: Not on file  Stress: Not on file  Social Connections: Not on file    Allergies Allergies  Allergen Reactions   Iron Dextran Anaphylaxis   Ciprocinonide [Fluocinolone]    Reglan [Metoclopramide] Other (See Comments)    Outpatient Meds Home medications from the H+P and/or nursing med reconciliation reviewed.  Inpatient med list reviewed  _____________________________________________________________________ Objective   Exam:  Current vital signs  Patient Vitals for the past 8 hrs:  BP Pulse Resp SpO2  03/13/21 1500 (!) 147/72 (!) 56 16 99 %  03/13/21 1430 (!) 138/97 (!) 57 16 97 %  03/13/21 1330 (!) 185/149 60 18 99 %  03/13/21 1230 (!) 146/95 (!) 55 18 100 %  03/13/21 1130 (!) 176/93 (!) 58 18 100 %  03/13/21 1030 (!) 178/69 64 18 100 %  03/13/21 0930 119/80 69 16 97 %  03/13/21 0915 117/66 68 18 99 %  03/13/21 0830 (!) 145/99 71 16 100 %   No intake or output data in the 24 hours ending 03/13/21 1617  Physical Exam:  Laying comfortably on stretcher, alert and conversational. Complains of nausea Eyes: sclera anicteric, no redness ENT: oral mucosa moist without lesions, no cervical or supraclavicular lymphadenopathy CV: RRR without murmur, S1/S2, no JVD,, no peripheral edema Resp: clear to auscultation bilaterally, normal RR and effort noted GI: soft, mild epigastric tenderness, with active bowel sounds. No guarding or palpable organomegaly noted.  Midline vertical scar without hernia Skin; warm and dry, no rash or jaundice noted Neuro: awake, alert and oriented x 3. Normal gross motor function and fluent speech.  Labs:  CBC Latest Ref Rng & Units 03/13/2021 04/11/2020 11/05/2018  WBC 4.0 - 10.5 K/uL 7.9 5.9 6.6  Hemoglobin 12.0 - 15.0 g/dL 13.0 12.7 12.4  Hematocrit 36.0 - 46.0 % 40.6 38.2 37.3  Platelets 150 - 400 K/uL 400 337.0 321.0    CMP Latest Ref Rng & Units 03/13/2021  04/11/2020 11/05/2018  Glucose 70 - 99 mg/dL 97 85 112(H)  BUN 6 - 20 mg/dL 6 7 11   Creatinine 0.44 - 1.00 mg/dL 0.62 0.71 0.76  Sodium 135 - 145 mmol/L 143 137 139  Potassium 3.5 - 5.1 mmol/L 3.6 4.0 3.5  Chloride 98 - 111 mmol/L 111 103 104  CO2 22 - 32 mmol/L 25 29 29   Calcium 8.9 - 10.3 mg/dL 9.3 9.2 9.0  Total Protein 6.5 - 8.1 g/dL 7.2 7.3 7.0  Total Bilirubin 0.3 - 1.2 mg/dL 0.3 0.3 0.3  Alkaline Phos 38 - 126 U/L 45 50 53  AST 15 - 41 U/L 19 15 17   ALT 0 - 44 U/L 13 10 12     No results for input(s): INR in the last 168 hours. _________________________________________________________ Radiologic studies:   ______________________________________________________ Other studies:   _______________________________________________________ Assessment & Plan  Impression: Epigastric pain Nausea without vomiting History of small bowel Crohn's disease, minimally active on last colonoscopy in 2020, symptomatically under control and last seen in office nearly a year ago.  Now with a few weeks of the above symptoms, cause unclear.  Sounds more like something upper digestive than Crohn's activity.  I personally reviewed the CT scan, and there does seem to be convincing inflammatory thickening of the right colon compared to the remainder of the colon, though she does not have diarrhea. She is afebrile, blood work all normal and reassuring, no bowel obstruction on CT scan.  I think she can be given symptomatic treatment and discharged home from the ED for close follow-up in our office.  She has an appointment scheduled for about 3 weeks from now, we will work on trying to get her one sooner.  Plan: Single dose of Solu-Medrol 40 mg IV once in ED Medrol Dosepak Pantoprazole 40 mg once daily Small supply of opioid analgesic A dose of metoclopramide while in the ED for better control of nausea, then discharged home with ondansetron.  Events to be communicated to Dr. Silverio Decamp.  Thank you  for the courtesy of this consult.  Please contact me with any questions or concerns.  Nelida Meuse III Office: [phone]

## 2021-03-13 NOTE — ED Notes (Signed)
To CT via stretcher

## 2021-03-13 NOTE — ED Notes (Signed)
Patient vomited after phenergan admin. MD notified

## 2021-03-13 NOTE — Discharge Instructions (Addendum)
We discussed the results of your CT scan on today's visit, you were provided with a copy of this for your records.

## 2021-03-13 NOTE — ED Provider Notes (Signed)
Patient received in sign out from J. Irene Pap, PA-C awaiting consult from gastroenterology.   Patient seen for abdominal pain. History of Crohn's, followed by Nandigam at [facility].  Results for orders placed or performed during the hospital encounter of 03/13/21  CBC  Result Value Ref Range   WBC 7.9 4.0 - 10.5 K/uL   RBC 4.59 3.87 - 5.11 MIL/uL   Hemoglobin 13.0 12.0 - 15.0 g/dL   HCT 40.6 36.0 - 46.0 %   MCV 88.5 80.0 - 100.0 fL   MCH 28.3 26.0 - 34.0 pg   MCHC 32.0 30.0 - 36.0 g/dL   RDW 15.2 11.5 - 15.5 %   Platelets 400 150 - 400 K/uL   nRBC 0.0 0.0 - 0.2 %  Urinalysis, Routine w reflex microscopic  Result Value Ref Range   Color, Urine YELLOW YELLOW   APPearance CLEAR CLEAR   Specific Gravity, Urine 1.015 1.005 - 1.030   pH 6.0 5.0 - 8.0   Glucose, UA NEGATIVE NEGATIVE mg/dL   Hgb urine dipstick LARGE (A) NEGATIVE   Bilirubin Urine NEGATIVE NEGATIVE   Ketones, ur NEGATIVE NEGATIVE mg/dL   Protein, ur NEGATIVE NEGATIVE mg/dL   Nitrite NEGATIVE NEGATIVE   Leukocytes,Ua NEGATIVE NEGATIVE   RBC / HPF 0-5 0 - 5 RBC/hpf   WBC, UA 0-5 0 - 5 WBC/hpf   Bacteria, UA RARE (A) NONE SEEN   Squamous Epithelial / LPF 0-5 0 - 5   Mucus PRESENT   Comprehensive metabolic panel  Result Value Ref Range   Sodium 143 135 - 145 mmol/L   Potassium 3.6 3.5 - 5.1 mmol/L   Chloride 111 98 - 111 mmol/L   CO2 25 22 - 32 mmol/L   Glucose, Bld 97 70 - 99 mg/dL   BUN 6 6 - 20 mg/dL   Creatinine, Ser 0.62 0.44 - 1.00 mg/dL   Calcium 9.3 8.9 - 10.3 mg/dL   Total Protein 7.2 6.5 - 8.1 g/dL   Albumin 3.6 3.5 - 5.0 g/dL   AST 19 15 - 41 U/L   ALT 13 0 - 44 U/L   Alkaline Phosphatase 45 38 - 126 U/L   Total Bilirubin 0.3 0.3 - 1.2 mg/dL   GFR, Estimated >60 >60 mL/min   Anion gap 7 5 - 15  Lipase, blood  Result Value Ref Range   Lipase 24 11 - 51 U/L  I-Stat beta hCG blood, ED  Result Value Ref Range   I-stat hCG, quantitative <5.0 <5 mIU/mL   Comment 3           CT ABDOMEN PELVIS W  CONTRAST  Result Date: 03/13/2021 CLINICAL DATA:  Mid abdominal pain, intermittent, history of Crohn's disease, suspect small bowel obstruction EXAM: CT ABDOMEN AND PELVIS WITH CONTRAST TECHNIQUE: Multidetector CT imaging of the abdomen and pelvis was performed using the standard protocol following bolus administration of intravenous contrast. CONTRAST:  29m OMNIPAQUE IOHEXOL 350 MG/ML SOLN COMPARISON:  12/11/2016 FINDINGS: Lower chest: No acute abnormality. Hepatobiliary: No focal liver abnormality is seen. Status post cholecystectomy. No biliary dilatation. Pancreas: Unremarkable. No pancreatic ductal dilatation or surrounding inflammatory changes. Spleen: Normal in size without significant abnormality. Adrenals/Urinary Tract: Adrenal glands are unremarkable. Kidneys are normal, without renal calculi, solid lesion, or hydronephrosis. Bladder is unremarkable. Stomach/Bowel: Stomach is within normal limits. Status post mid small bowel resection and terminal ileocolectomy and ileocolic reanastomosis. There is diffuse inflammatory wall thickening of the colon, most conspicuously involving the ascending and transverse colon (series 2,  image 30). Vascular/Lymphatic: No significant vascular findings are present. No enlarged abdominal or pelvic lymph nodes. Reproductive: No mass or other significant abnormality. Other: No abdominal wall hernia or abnormality. No abdominopelvic ascites. Musculoskeletal: No acute or significant osseous findings. IMPRESSION: 1. Diffuse inflammatory wall thickening of the colon, most conspicuously involving the ascending and transverse colon, findings consistent with infectious or inflammatory colitis, and in keeping with history of Crohn's. 2. Status post mid small bowel resection and terminal ileocolectomy. 3. No evidence of bowel obstruction at this time. Electronically Signed   By: Eddie Candle M.D.   On: 03/13/2021 13:02       Patient seen by [facility] ([facility]) in the ED with the  following plan:   Single dose of Solu-Medrol 40 mg IV once in ED Medrol Dosepak Pantoprazole 40 mg once daily Small supply of opioid analgesic A dose of metoclopramide while in the ED for better control of nausea, then discharged home with ondansetron.  Patient received dose of phenergan in the ED prior to GI recommendation of reglan with good results. On reassessment, nausea has abated and patient is tolerating po fluids without difficulty.   Patient is nontoxic, nonseptic appearing, in no apparent distress.  Patient's pain and other symptoms adequately managed in emergency department.  Fluid bolus given.  Labs, imaging and vitals reviewed.  Patient does not meet the SIRS or Sepsis criteria.  On repeat exam patient does not have a surgical abdomen and there are no peritoneal signs.  No indication of appendicitis, bowel obstruction, bowel perforation, cholecystitis, diverticulitis.  Patient discharged home with treatment recommendations provided by gastroenterology. Patient to follow-up with GI.  I have also discussed reasons to return immediately to the ER.  Patient expresses understanding and agrees with plan.    Patient appears safe for discharge at this time.    Etta Quill, NP 03/13/21 1842    Lorelle Gibbs, DO 03/13/21 2017

## 2021-04-05 ENCOUNTER — Encounter: Payer: Self-pay | Admitting: Gastroenterology

## 2021-04-05 ENCOUNTER — Ambulatory Visit (INDEPENDENT_AMBULATORY_CARE_PROVIDER_SITE_OTHER): Payer: BC Managed Care – PPO | Admitting: Gastroenterology

## 2021-04-05 VITALS — BP 130/90 | Ht 64.0 in | Wt 217.8 lb

## 2021-04-05 DIAGNOSIS — R1013 Epigastric pain: Secondary | ICD-10-CM | POA: Diagnosis not present

## 2021-04-05 DIAGNOSIS — K50818 Crohn's disease of both small and large intestine with other complication: Secondary | ICD-10-CM

## 2021-04-05 MED ORDER — BUDESONIDE 3 MG PO CPEP: 9.0000 mg | ORAL_CAPSULE | Freq: Every day | ORAL | 3 refills | Status: AC

## 2021-04-05 NOTE — Patient Instructions (Signed)
You have been scheduled for an endoscopy. Please follow written instructions given to you at your visit today. If you use inhalers (even only as needed), please bring them with you on the day of your procedure.   Your provider has requested that you go to the basement level for lab work before leaving today. Press "B" on the elevator. The lab is located at the first door on the left as you exit the elevator.   We will send Budesonide 9 mg to your pharmacy  Due to recent changes in healthcare laws, you may see the results of your imaging and laboratory studies on MyChart before your provider has had a chance to review them.  We understand that in some cases there may be results that are confusing or concerning to you. Not all laboratory results come back in the same time frame and the provider may be waiting for multiple results in order to interpret others.  Please give [place] 48 hours in order for your provider to thoroughly review all the results before contacting the office for clarification of your results.    If you are age 62 or older, your body mass index should be between 23-30. Your Body mass index is 37.39 kg/m. If this is out of the aforementioned range listed, please consider follow up with your Primary Care Provider.  If you are age 69 or younger, your body mass index should be between 19-25. Your Body mass index is 37.39 kg/m. If this is out of the aformentioned range listed, please consider follow up with your Primary Care Provider.   __________________________________________________________  The [facility] providers would like to encourage you to use [facility] to communicate with providers for non-urgent requests or questions.  Due to long hold times on the telephone, sending your provider a message by [facility] may be a faster and more efficient way to get a response.  Please allow 48 business hours for a response.  Please remember that this is for non-urgent requests.    Thank you for  choosing [facility]  Karleen Hampshire Nandigam,MD

## 2021-04-05 NOTE — Progress Notes (Signed)
[name]    [id number]    03-15-1991  Primary Care Physician:Bailey, Holli Humbles, FNP  Referring Physician: Barrie Lyme, [place] [address] [place],  [place] [address]   Chief complaint: Epigastric abdominal pain  HPI:  30 year old African-American female with history of Crohn's disease diagnosed at age 48 is here with complaints of epigastric abdominal pain  She is currently not on any immunosuppressive therapy. She is having intermittent abdominal discomfort and bloating, is worried about Crohn's flare No rectal bleeding or melena  She discontinued 6-MP and Humira around 2018 as she was asymptomatic and was also struggling with severe depression and anxiety at the time.    She is having regular bowel movement once every day or every other day. Denies any nausea, vomiting,  loss of appetite or weight loss.  Colonoscopy Nov 30, 2018: - One 2 mm polyp in the ascending colon, removed with a cold biopsy forceps. Resected and retrieved. - A single (solitary) ulcer in the distal ileum. Biopsied. - Normal mucosa in the left colon and in the right colon. Biopsied.    CT abdomen pelvis May 2018 mild fluid-filled distention of distal jejunum and ileum, short segment of luminal narrowing involving the distal ileum which could potentially represent a short segment stricture.  Right adnexal ovarian cyst 4 X 3 cm.  Small umbilical fat-containing hernia     Relevant GI history: Complicated Crohn's disease since age 64. Initially found in the duodenum when she presented with iron deficiency anemia. She is allergic to iron dextran. She underwent terminal ileal resection , ileostomy and takedown of ileostomy several years ago. She has a history of rectal abscess which was drained in October 2014 and  4 /2015. She was on Humira 40 mg every 2 weeks and 6 MP 50 mg daily.  Upper endoscopy in April 2013 showed no evidence of duodenal Crohn's disease. 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 Surgical [P], terminal ileum, biopsy - BENIGN ILEAL MUCOSA. NO ACTIVE INFLAMMATION OR GRANULOMAS. 2. Surgical [P], anastomosis, biopsy - BENIGN COLONIC MUCOSA. NO ACTIVE INFLAMMATION OR GRANULOMAS. 3. Surgical [P], descending, biopsy - BENIGN COLONIC MUCOSA. NO ACTIVE INFLAMMATION OR GRANULOMAS.   Outpatient Encounter Medications as of 04/05/2021  Medication Sig   acetaminophen (TYLENOL) 500 MG tablet Take 1,000 mg by mouth every 6 (six) hours as needed for mild pain or headache.   ELDERBERRY PO Take 1 tablet by mouth daily.   ibuprofen (ADVIL) 200 MG tablet Take 400 mg by mouth every 6 (six) hours as needed for mild pain.   methylPREDNISolone (MEDROL DOSEPAK) 4 MG TBPK tablet Take as directed on dose pack.   Multiple Vitamin (MULTIVITAMIN) tablet Take 1 tablet by mouth daily.   Multiple Vitamins-Minerals (AIRBORNE PO) Take 1 tablet by mouth daily.   [DISCONTINUED] oxyCODONE-acetaminophen (PERCOCET/ROXICET) 5-325 MG tablet Take 1 tablet by mouth every 6 (six) hours as needed for severe pain. (Patient not taking: Reported on 04/05/2021)   [DISCONTINUED] pantoprazole (PROTONIX) 40 MG tablet Take 1 tablet (40 mg total) by mouth daily. (Patient not taking: Reported on 04/05/2021)   No facility-administered encounter medications on file as of 04/05/2021.    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.nausea   Depression 2011   resolved - after 2011 surgery, no problem   DVT (deep venous thrombosis) ([facility]) 08/2010   Left IJ (postop) anticoag x 3 mo   Gastroparesis    GERD (gastroesophageal reflux disease)    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   History of blood transfusion 2011   [place]  2 units transfuses   Hypertension 2011   resolved - after 2011 surgery, no problems   MRSA (methicillin resistant Staphylococcus aureus) 04/2010   multiple abscesses   Ovarian cyst, left    Perianal fistula    Perirectal abscess 04/21/2013   PONV (postoperative nausea and vomiting)    Small bowel obstruction ([place]) 2011   SVT (supraventricular tachycardia) ([place]) 2011   resolved, after 2011 surgery, no problem   Tremor, anxiety related     Past Surgical History:  Procedure Laterality Date   APPENDECTOMY     CESAREAN SECTION N/A 08/16/2013   Procedure: Primary CESAREAN SECTION;  Surgeon: Betsy Coder, MD;  Location: [facility];  Service: Obstetrics;  Laterality: N/A;   CHOLECYSTECTOMY  2009   closure of ileostomy/loop  08/2010   colectomy with diverting loop ileostomy  2011   COLONOSCOPY  2013   ESOPHAGOGASTRODUODENOSCOPY  11/11/2011   Procedure: ESOPHAGOGASTRODUODENOSCOPY (EGD);  Surgeon: Lafayette Dragon, MD;  Location: [facility];  Service: Endoscopy;  Laterality: N/A;   EXAMINATION UNDER ANESTHESIA  11/12/2011   Procedure: EXAM UNDER ANESTHESIA;  Surgeon: Rolm Bookbinder, MD;  Location: [facility];  Service: General;  Laterality: N/A;   EXAMINATION UNDER ANESTHESIA N/A 04/01/2013   Procedure: EXAM UNDER ANESTHESIA;  Surgeon: Stark Klein, MD;  Location: [facility];  Service: General;  Laterality: N/A;   INCISION AND DRAINAGE PERIRECTAL  ABSCESS  11/12/2011   Procedure: IRRIGATION AND DEBRIDEMENT PERIRECTAL ABSCESS;  Surgeon: Rolm Bookbinder, MD;  Location: [facility];  Service: General;  Laterality: N/A;   INCISION AND DRAINAGE PERIRECTAL ABSCESS  12/11/2011   Procedure: IRRIGATION AND DEBRIDEMENT PERIRECTAL ABSCESS;  Surgeon: [name], MD;  Location: [facility];  Service: General;  Laterality: N/A;   INCISION AND DRAINAGE PERIRECTAL ABSCESS N/A 04/01/2013   Procedure: IRRIGATION AND DEBRIDEMENT PERIRECTAL ABSCESS;  Surgeon: Stark Klein, MD;  Location: [facility];  Service: General;  Laterality: N/A;   PROCTOSCOPY  11/12/2011   Procedure: PROCTOSCOPY;  Surgeon: Rolm Bookbinder, MD;  Location: [facility];  Service: General;  Laterality: N/A;   UPPER GASTROINTESTINAL ENDOSCOPY      Family History  Problem Relation Age of Onset   Heart attack Maternal Grandmother    Heart disease Maternal Grandmother    Emphysema Maternal Grandmother    Sickle cell trait Father    Diabetes Maternal Uncle    Diabetes Cousin    Colon cancer Neg Hx    Seizures Neg Hx    Colon polyps  Neg Hx    Esophageal cancer Neg Hx    Rectal cancer Neg Hx    Stomach cancer Neg Hx     Social History   Socioeconomic History   Marital status: Single    Spouse name: Not on file   Number of children: 1   Years of education: 15   Highest education level: Not on file  Occupational History   Occupation: Lexicographer: STUDENT  Tobacco Use   Smoking status: Never   Smokeless tobacco: Never  Substance and Sexual Activity   Alcohol use: Yes    Alcohol/week: 6.0 standard drinks    Types: 6 Shots of liquor per week    Comment: occasionally   Drug use: No   Sexual activity: Yes    Birth control/protection: Implant  Other Topics Concern   Not on file  Social History Narrative   Single, lives mother.   Occasionally drinks a soda.    Social Determinants of Health   Financial Resource Strain: Not on file  Food Insecurity: Not on file  Transportation  Needs: Not on file  Physical Activity: Not on file  Stress: Not on file  Social Connections: Not on file  Intimate Partner Violence: Not on file      Review of systems: All other review of systems negative except as mentioned in the HPI.   Physical Exam: Vitals:   04/05/21 1107  BP: 130/90   Body mass index is 37.39 kg/m. Gen:      No acute distress HEENT:  sclera anicteric Abd:      soft, non-tender; no palpable masses, no distension Ext:    No edema Neuro: alert and oriented x 3 Psych: normal mood and affect  Data Reviewed:  Reviewed labs, radiology imaging, old records and pertinent past GI work up   Assessment and Plan/Recommendations:  30 year old very pleasant female with history of Crohn's disease involving duodenum and distal ileum s/p terminal ileal resection with ileostomy and subsequent takedown of ileostomy with ileocolonic anastomosis  Complains of epigastric abdominal pain: We will need to exclude recurrent Crohn's flare Start budesonide 9 mg daily Schedule for EGD for further evaluation and obtain biopsies if needed  She is currently not on immunosuppressive therapy, self discontinued around 2018 Follow-up CBC, CMP, CRP, iron panel, B12, folate and ESR for IBD health maintenance  Further recommendations based on EGD findings The risks and benefits as well as alternatives of endoscopic procedure(s) have been discussed and reviewed. All questions answered. The patient agrees to proceed.   The patient was provided an opportunity to ask questions and all were answered. The patient agreed with the plan and demonstrated an understanding of the instructions.  Damaris Hippo , MD    CC: Barrie Lyme, FNP

## 2021-04-08 ENCOUNTER — Encounter: Payer: Self-pay | Admitting: Gastroenterology

## 2021-04-12 ENCOUNTER — Ambulatory Visit (AMBULATORY_SURGERY_CENTER): Payer: BC Managed Care – PPO | Admitting: Gastroenterology

## 2021-04-12 ENCOUNTER — Encounter: Payer: Self-pay | Admitting: Gastroenterology

## 2021-04-12 ENCOUNTER — Other Ambulatory Visit: Payer: Self-pay

## 2021-04-12 VITALS — BP 118/100 | HR 64 | Temp 98.9°F | Resp 16 | Ht 64.0 in | Wt 217.0 lb

## 2021-04-12 DIAGNOSIS — R1013 Epigastric pain: Secondary | ICD-10-CM

## 2021-04-12 DIAGNOSIS — K2950 Unspecified chronic gastritis without bleeding: Secondary | ICD-10-CM | POA: Diagnosis not present

## 2021-04-12 DIAGNOSIS — K5000 Crohn's disease of small intestine without complications: Secondary | ICD-10-CM

## 2021-04-12 DIAGNOSIS — K2970 Gastritis, unspecified, without bleeding: Secondary | ICD-10-CM

## 2021-04-12 MED ORDER — SODIUM CHLORIDE 0.9 % IV SOLN: 4.0000 mg | Freq: Once | INTRAVENOUS | Status: AC

## 2021-04-12 MED ORDER — SODIUM CHLORIDE 0.9 % IV SOLN: 500.0000 mL | Freq: Once | INTRAVENOUS | Status: DC

## 2021-04-12 NOTE — Progress Notes (Signed)
Called to room to assist during endoscopic procedure.  Patient ID and intended procedure confirmed with present staff. Received instructions for my participation in the procedure from the performing physician.  

## 2021-04-12 NOTE — Progress Notes (Signed)
Pt in recovery with monitors in place, VSS. Report given to receiving RN. Bite guard was placed with pt awake to ensure comfort. No dental or soft tissue damage noted.

## 2021-04-12 NOTE — Progress Notes (Signed)
Pt. Feeling markedly improved.  Color normal, skin warm and dry, nausea subsided.  Pt. Alert.  Pt. Reports that BP cuffs give her anxiety when they inflate, and felt the elevated reading was to be attributed to that.

## 2021-04-12 NOTE — Progress Notes (Signed)
Please refer to office visit note 04/05/21. No additional changes in H&P Patient is appropriate for planned procedure(s) and anesthesia in an ambulatory setting  K. Denzil Magnuson , MD [phone]

## 2021-04-12 NOTE — Patient Instructions (Signed)
Impression/Recommendations:  Gastritis handout given to patient.  Resume previous diet. Continue present medications. Continue Budesonide. Await pathology results.  CT abdomen and pelvis enterography with contrast to exclude small bowel ulcer/fistula, to be scheduled.  YOU HAD AN ENDOSCOPIC PROCEDURE TODAY AT [facility]:   Refer to the procedure report that was given to you for any specific questions about what was found during the examination.  If the procedure report does not answer your questions, please call your gastroenterologist to clarify.  If you requested that your care partner not be given the details of your procedure findings, then the procedure report has been included in a sealed envelope for you to review at your convenience later.  YOU SHOULD EXPECT: Some feelings of bloating in the abdomen. Passage of more gas than usual.  Walking can help get rid of the air that was put into your GI tract during the procedure and reduce the bloating. If you had a lower endoscopy (such as a colonoscopy or flexible sigmoidoscopy) you may notice spotting of blood in your stool or on the toilet paper. If you underwent a bowel prep for your procedure, you may not have a normal bowel movement for a few days.  Please Note:  You might notice some irritation and congestion in your nose or some drainage.  This is from the oxygen used during your procedure.  There is no need for concern and it should clear up in a day or so.  SYMPTOMS TO REPORT IMMEDIATELY:  Following upper endoscopy (EGD)  Vomiting of blood or coffee ground material  New chest pain or pain under the shoulder blades  Painful or persistently difficult swallowing  New shortness of breath  Fever of 100F or higher  Black, tarry-looking stools  For urgent or emergent issues, a gastroenterologist can be reached at any hour by calling [phone]. Do not use MyChart messaging for urgent concerns.    DIET:  We do  recommend a small meal at first, but then you may proceed to your regular diet.  Drink plenty of fluids but you should avoid alcoholic beverages for 24 hours.  ACTIVITY:  You should plan to take it easy for the rest of today and you should NOT DRIVE or use heavy machinery until tomorrow (because of the sedation medicines used during the test).    FOLLOW UP: Our staff will call the number listed on your records 48-72 hours following your procedure to check on you and address any questions or concerns that you may have regarding the information given to you following your procedure. If we do not reach you, we will leave a message.  We will attempt to reach you two times.  During this call, we will ask if you have developed any symptoms of COVID 19. If you develop any symptoms (ie: fever, flu-like symptoms, shortness of breath, cough etc.) before then, please call [phone].  If you test positive for Covid 19 in the 2 weeks post procedure, please call and report this information to [place].    If any biopsies were taken you will be contacted by phone or by letter within the next 1-3 weeks.  Please call us at [phone] if you have not heard about the biopsies in 3 weeks.    SIGNATURES/CONFIDENTIALITY: You and/or your care partner have signed paperwork which will be entered into your electronic medical record.  These signatures attest to the fact that that the information above on your After Visit Summary has  been reviewed and is understood.  Full responsibility of the confidentiality of this discharge information lies with you and/or your care-partner.

## 2021-04-12 NOTE — Progress Notes (Signed)
Pt's states no medical or surgical changes since previsit or office visit. VS assessed by D.T 

## 2021-04-12 NOTE — Progress Notes (Signed)
Pt. Received .04 mg. IV in 10 mL of saline at 1634.

## 2021-04-12 NOTE — Op Note (Signed)
[facility] Patient Name: [name] Procedure Date: 04/12/2021 3:56 PM MRN: [id number] Endoscopist: Mauri Pole , MD Age: 30 Referring MD:  Date of Birth: June 05, 1991 Gender: Female Account #: [id number] Procedure:                Upper GI endoscopy Indications:              Epigastric abdominal pain, Crohn's disease Medicines:                Monitored Anesthesia Care Procedure:                Pre-Anesthesia Assessment:                           - Prior to the procedure, a History and Physical                            was performed, and patient medications and                            allergies were reviewed. The patient's tolerance of                            previous anesthesia was also reviewed. The risks                            and benefits of the procedure and the sedation                            options and risks were discussed with the patient.                            All questions were answered, and informed consent                            was obtained. Prior Anticoagulants: The patient has                            taken no previous anticoagulant or antiplatelet                            agents. ASA Grade Assessment: III - A patient with                            severe systemic disease. After reviewing the risks                            and benefits, the patient was deemed in                            satisfactory condition to undergo the procedure.                           After obtaining informed consent, the endoscope was  passed under direct vision. Throughout the                            procedure, the patient's blood pressure, pulse, and                            oxygen saturations were monitored continuously. The                            GIF [id number] #[id number] was introduced through the                            mouth, and advanced to the second part of duodenum.                            The  upper GI endoscopy was accomplished without                            difficulty. The patient tolerated the procedure                            well. Scope In: Scope Out: Findings:                 No gross lesions were noted in the entire esophagus.                           The Z-line was regular and was found 35 cm from the                            incisors.                           Patchy mild inflammation characterized by                            congestion (edema) and erythema was found in the                            entire examined stomach. Biopsies were taken with a                            cold forceps for histology.                           Patchy mildly erythematous mucosa without active                            bleeding was found in the first portion of the                            duodenum and in the second portion of the duodenum.                            Biopsies were taken  with a cold forceps for                            histology. Complications:            No immediate complications. Estimated Blood Loss:     Estimated blood loss was minimal. Impression:               - No gross lesions in esophagus.                           - Z-line regular, 35 cm from the incisors.                           - Gastritis. Biopsied.                           - Erythematous duodenopathy. Biopsied. Recommendation:           - Resume previous diet.                           - Continue present medications.                           - Await pathology results.                           - Continue Budesonide                           - CT abd & pelvis enterography with contrast to                            exclude small bowel ulcer/fistula, to be scheduled Mauri Pole, MD 04/12/2021 4:26:05 PM This report has been signed electronically.

## 2021-04-15 ENCOUNTER — Other Ambulatory Visit: Payer: Self-pay

## 2021-04-15 ENCOUNTER — Telehealth: Payer: Self-pay

## 2021-04-15 DIAGNOSIS — K5229 Other allergic and dietetic gastroenteritis and colitis: Secondary | ICD-10-CM

## 2021-04-15 DIAGNOSIS — R1013 Epigastric pain: Secondary | ICD-10-CM

## 2021-04-15 DIAGNOSIS — K50818 Crohn's disease of both small and large intestine with other complication: Secondary | ICD-10-CM

## 2021-04-15 NOTE — Telephone Encounter (Signed)
Order for the CT enterography abd/pelvis placed. Message to the radiology schedulers to contact patient. Message left on the patient's voicemail advising her to expect the call.

## 2021-04-16 ENCOUNTER — Telehealth: Payer: Self-pay | Admitting: *Deleted

## 2021-04-16 NOTE — Telephone Encounter (Signed)
  Follow up Call-  Call back number 04/12/2021 11/30/2018  Post procedure Call Back phone  # [phone]  Permission to leave phone message Yes Yes  Some recent data might be hidden     Patient questions:  Do you have a fever, pain , or abdominal swelling? No. Pain Score  0 *  Have you tolerated food without any problems? Yes.    Have you been able to return to your normal activities? Yes.    Do you have any questions about your discharge instructions: Diet   No. Medications  No. Follow up visit  No.  Do you have questions or concerns about your Care? No.  Actions: * If pain score is 4 or above: No action needed, pain <4.  Have you developed a fever since your procedure? no  2.   Have you had an respiratory symptoms (SOB or cough) since your procedure? no  3.   Have you tested positive for COVID 19 since your procedure no  4.   Have you had any family members/close contacts diagnosed with the COVID 19 since your procedure?  no   If yes to any of these questions please route to Joylene John, RN and Joella Prince, RN

## 2021-04-23 ENCOUNTER — Encounter: Payer: Self-pay | Admitting: Gastroenterology

## 2021-04-23 ENCOUNTER — Other Ambulatory Visit (INDEPENDENT_AMBULATORY_CARE_PROVIDER_SITE_OTHER): Payer: BC Managed Care – PPO

## 2021-04-23 DIAGNOSIS — K50818 Crohn's disease of both small and large intestine with other complication: Secondary | ICD-10-CM | POA: Diagnosis not present

## 2021-04-23 DIAGNOSIS — R1013 Epigastric pain: Secondary | ICD-10-CM

## 2021-04-23 LAB — IBC + FERRITIN
Ferritin: 7.3 ng/mL — ABNORMAL LOW (ref 10.0–291.0)
Iron: 43 ug/dL (ref 42–145)
Saturation Ratios: 7.9 % — ABNORMAL LOW (ref 20.0–50.0)
TIBC: 546 ug/dL — ABNORMAL HIGH (ref 250.0–450.0)
Transferrin: 390 mg/dL — ABNORMAL HIGH (ref 212.0–360.0)

## 2021-04-23 LAB — CBC WITH DIFFERENTIAL/PLATELET
Basophils Absolute: 0 10*3/uL (ref 0.0–0.1)
Basophils Relative: 0.3 % (ref 0.0–3.0)
Eosinophils Absolute: 0 10*3/uL (ref 0.0–0.7)
Eosinophils Relative: 0.4 % (ref 0.0–5.0)
HCT: 38.3 % (ref 36.0–46.0)
Hemoglobin: 12.4 g/dL (ref 12.0–15.0)
Lymphocytes Relative: 31.9 % (ref 12.0–46.0)
Lymphs Abs: 2.5 10*3/uL (ref 0.7–4.0)
MCHC: 32.5 g/dL (ref 30.0–36.0)
MCV: 88.6 fl (ref 78.0–100.0)
Monocytes Absolute: 0.6 10*3/uL (ref 0.1–1.0)
Monocytes Relative: 7.5 % (ref 3.0–12.0)
Neutro Abs: 4.7 10*3/uL (ref 1.4–7.7)
Neutrophils Relative %: 59.9 % (ref 43.0–77.0)
Platelets: 367 10*3/uL (ref 150.0–400.0)
RBC: 4.32 Mil/uL (ref 3.87–5.11)
RDW: 16.2 % — ABNORMAL HIGH (ref 11.5–15.5)
WBC: 7.9 10*3/uL (ref 4.0–10.5)

## 2021-04-23 LAB — COMPREHENSIVE METABOLIC PANEL
ALT: 7 U/L (ref 0–35)
AST: 15 U/L (ref 0–37)
Albumin: 4.1 g/dL (ref 3.5–5.2)
Alkaline Phosphatase: 45 U/L (ref 39–117)
BUN: 11 mg/dL (ref 6–23)
CO2: 24 mEq/L (ref 19–32)
Calcium: 9.4 mg/dL (ref 8.4–10.5)
Chloride: 103 mEq/L (ref 96–112)
Creatinine, Ser: 0.72 mg/dL (ref 0.40–1.20)
GFR: 110.79 mL/min (ref >60.00)
Glucose, Bld: 90 mg/dL (ref 70–99)
Potassium: 3.9 mEq/L (ref 3.5–5.1)
Sodium: 136 mEq/L (ref 135–145)
Total Bilirubin: 0.2 mg/dL (ref 0.2–1.2)
Total Protein: 7.3 g/dL (ref 6.0–8.3)

## 2021-04-23 LAB — B12 AND FOLATE PANEL
Folate: 9.3 ng/mL (ref >5.9)
Vitamin B-12: 280 pg/mL (ref 211–911)

## 2021-04-23 LAB — HIGH SENSITIVITY CRP: CRP, High Sensitivity: 2.31 mg/L (ref 0.000–5.000)

## 2021-04-23 LAB — SEDIMENTATION RATE: Sed Rate: 58 mm/hr — ABNORMAL HIGH (ref 0–20)

## 2021-04-27 LAB — QUANTIFERON-TB GOLD PLUS
Mitogen-NIL: >10 IU/mL
NIL: 0.08 IU/mL
QuantiFERON-TB Gold Plus: NEGATIVE
TB1-NIL: <0 IU/mL
TB2-NIL: 0 IU/mL

## 2021-05-28 ENCOUNTER — Encounter: Payer: Self-pay | Admitting: Gastroenterology

## 2021-05-28 ENCOUNTER — Other Ambulatory Visit (INDEPENDENT_AMBULATORY_CARE_PROVIDER_SITE_OTHER): Payer: BC Managed Care – PPO

## 2021-05-28 ENCOUNTER — Ambulatory Visit (INDEPENDENT_AMBULATORY_CARE_PROVIDER_SITE_OTHER): Payer: BC Managed Care – PPO | Admitting: Gastroenterology

## 2021-05-28 VITALS — BP 128/78 | HR 72 | Ht 64.0 in | Wt 229.8 lb

## 2021-05-28 DIAGNOSIS — R1013 Epigastric pain: Secondary | ICD-10-CM

## 2021-05-28 DIAGNOSIS — K5000 Crohn's disease of small intestine without complications: Secondary | ICD-10-CM | POA: Diagnosis not present

## 2021-05-28 DIAGNOSIS — D509 Iron deficiency anemia, unspecified: Secondary | ICD-10-CM | POA: Diagnosis not present

## 2021-05-28 LAB — BUN: BUN: 8 mg/dL (ref 6–23)

## 2021-05-28 LAB — CREATININE, SERUM: Creatinine, Ser: 0.67 mg/dL (ref 0.40–1.20)

## 2021-05-28 NOTE — Progress Notes (Signed)
[name]    [id number]    1990-12-27  Primary Care Physician:Bailey, Holli Humbles, FNP  Referring Physician: Barrie Lyme, FNP [address] [place],  [place] [address]   Chief complaint:  Crohn's disease  HPI: 30 year old African-American female with history of Crohn's disease diagnosed at age 32 is here with complaints of epigastric abdominal pain   She is currently not on any immunosuppressive therapy. She is having intermittent abdominal discomfort and bloating, is worried about Crohn's flare. Symptoms are some what improved on daily Budesonide No rectal bleeding or melena   She discontinued 6-MP and Humira around 2018 as she was asymptomatic and was also struggling with severe depression and anxiety at the time.     She is having regular bowel movement once every day or every other day. Denies any nausea, vomiting,  loss of appetite or weight loss.   Colonoscopy Nov 30, 2018: - One 2 mm polyp in the ascending colon, removed with a cold biopsy forceps. Resected and retrieved. - A single (solitary) ulcer in the distal ileum. Biopsied. - Normal mucosa in the left colon and in the right colon. Biopsied.     CT abdomen pelvis May 2018 mild fluid-filled distention of distal jejunum and ileum, short segment of luminal narrowing involving the distal ileum which could potentially represent a short segment stricture.  Right adnexal ovarian cyst 4 X 3 cm.  Small umbilical fat-containing hernia  EGD April 12, 2021 - No gross lesions in esophagus. - Z-line regular, 35 cm from the incisors. - Gastritis. Biopsied. - Erythematous duodenopathy. Biopsied.  Relevant GI history: Complicated Crohn's disease since age 54. Initially found in the duodenum when she presented with iron deficiency anemia. She is allergic to iron dextran. She underwent terminal ileal resection , ileostomy and takedown of ileostomy several years ago. She has a history of  rectal abscess which was drained in October 2014 and  4 /2015. She was on Humira 40 mg every 2 weeks and 6 MP 50 mg daily.  Upper endoscopy in April 2013 showed no evidence of duodenal Crohn's disease. 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 Surgical [P], terminal ileum, biopsy - BENIGN ILEAL MUCOSA. NO ACTIVE INFLAMMATION OR GRANULOMAS. 2. Surgical [P], anastomosis, biopsy - BENIGN COLONIC MUCOSA. NO ACTIVE INFLAMMATION OR GRANULOMAS. 3. Surgical [P], descending, biopsy - BENIGN COLONIC MUCOSA. NO ACTIVE INFLAMMATION OR GRANULOMAS.  Outpatient Encounter Medications as of 05/28/2021  Medication Sig   acetaminophen (TYLENOL) 500 MG tablet Take 1,000 mg by mouth every 6 (six) hours as needed for mild pain or headache.   budesonide (ENTOCORT EC) 3 MG 24 hr capsule Take 3 capsules (9 mg total) by mouth daily.   ELDERBERRY PO Take 1 tablet by mouth daily.   ibuprofen (ADVIL) 200 MG tablet Take 400 mg by mouth every 6 (six) hours as needed for mild pain.   methylPREDNISolone (MEDROL DOSEPAK) 4 MG TBPK tablet Take as directed on dose pack. (Patient not taking: Reported on 04/12/2021)   Multiple Vitamin (MULTIVITAMIN) tablet Take 1 tablet by mouth daily.   Multiple Vitamins-Minerals (AIRBORNE PO) Take 1 tablet by mouth daily. (Patient not taking: Reported on 04/12/2021)   Facility-Administered Encounter Medications as of 05/28/2021  Medication  ondansetron (ZOFRAN) 4 mg in sodium chloride 0.9 % 50 mL IVPB    Allergies as of 05/28/2021 - Review Complete 04/12/2021  Allergen Reaction Noted   Iron dextran Anaphylaxis 08/19/2010   Ciprocinonide [fluocinolone]  10/26/2014   Reglan [metoclopramide] Other (See Comments) 10/26/2014    Past Medical History:   Diagnosis Date   Acute renal failure ([place])    Allergy    Anemia    iron def   Anxiety 2011   resolved - after 2011 surgery, no problem   Blood transfusion    Blood transfusion without reported diagnosis    Candida esophagitis ([facility])    Clotting disorder ([facility])    hx DVT    Crohn's disease ([place]) dx 2004   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.nausea   Depression 2011   resolved - after 2011 surgery, no problem   DVT (deep venous thrombosis) ([facility]) 08/2010   Left IJ (postop) anticoag x 3 mo   Gastroparesis    GERD (gastroesophageal reflux disease)    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   History of blood transfusion 2011   [place]  2 units transfuses   Hypertension 2011   resolved - after 2011 surgery, no problems   MRSA (methicillin resistant Staphylococcus aureus) 04/2010   multiple abscesses   Ovarian cyst, left    Perianal fistula    Perirectal abscess 04/21/2013   PONV (postoperative nausea and vomiting)    Small bowel obstruction ([place]) 2011   SVT (supraventricular tachycardia) ([facility]) 2011   resolved, after 2011 surgery, no problem   Tremor, anxiety related     Past Surgical History:  Procedure Laterality Date   APPENDECTOMY     CESAREAN SECTION N/A 08/16/2013   Procedure: Primary CESAREAN SECTION;  Surgeon: Betsy Coder, MD;  Location: [facility];  Service: Obstetrics;  Laterality: N/A;   CHOLECYSTECTOMY  2009   closure of ileostomy/loop  08/2010   colectomy with diverting loop ileostomy  2011   COLONOSCOPY  2013   ESOPHAGOGASTRODUODENOSCOPY  11/11/2011   Procedure: ESOPHAGOGASTRODUODENOSCOPY (EGD);  Surgeon: Lafayette Dragon, MD;  Location: [facility];  Service: Endoscopy;  Laterality: N/A;   EXAMINATION UNDER ANESTHESIA  11/12/2011   Procedure: EXAM UNDER ANESTHESIA;  Surgeon: Rolm Bookbinder, MD;  Location: [facility];  Service: General;  Laterality: N/A;   EXAMINATION UNDER ANESTHESIA N/A 04/01/2013   Procedure: EXAM UNDER ANESTHESIA;   Surgeon: Stark Klein, MD;  Location: [facility];  Service: General;  Laterality: N/A;   INCISION AND DRAINAGE PERIRECTAL ABSCESS  11/12/2011   Procedure: IRRIGATION AND DEBRIDEMENT PERIRECTAL ABSCESS;  Surgeon: Rolm Bookbinder, MD;  Location: [facility];  Service: General;  Laterality: N/A;   INCISION AND DRAINAGE PERIRECTAL ABSCESS  12/11/2011   Procedure: IRRIGATION AND DEBRIDEMENT PERIRECTAL ABSCESS;  Surgeon: [name], MD;  Location: [facility];  Service: General;  Laterality: N/A;   INCISION AND DRAINAGE PERIRECTAL ABSCESS N/A 04/01/2013   Procedure: IRRIGATION AND DEBRIDEMENT PERIRECTAL ABSCESS;  Surgeon: Stark Klein, MD;  Location: [facility];  Service: General;  Laterality: N/A;   PROCTOSCOPY  11/12/2011   Procedure: PROCTOSCOPY;  Surgeon: Rolm Bookbinder, MD;  Location: [facility];  Service: General;  Laterality: N/A;   UPPER GASTROINTESTINAL ENDOSCOPY      Family History  Problem Relation Age of Onset   Heart attack Maternal Grandmother    Heart disease Maternal Grandmother    Emphysema Maternal Grandmother    Sickle cell trait Father  Diabetes Maternal Uncle    Diabetes Cousin    Colon cancer Neg Hx    Seizures Neg Hx    Colon polyps Neg Hx    Esophageal cancer Neg Hx    Rectal cancer Neg Hx    Stomach cancer Neg Hx     Social History   Socioeconomic History   Marital status: Single    Spouse name: Not on file   Number of children: 1   Years of education: 15   Highest education level: Not on file  Occupational History   Occupation: Lexicographer: STUDENT  Tobacco Use   Smoking status: Never   Smokeless tobacco: Never  Substance and Sexual Activity   Alcohol use: Yes    Alcohol/week: 6.0 standard drinks    Types: 6 Shots of liquor per week    Comment: occasionally   Drug use: No   Sexual activity: Yes    Birth control/protection: Implant  Other Topics Concern   Not on file  Social History Narrative   Single, lives mother.   Occasionally drinks a soda.     Social Determinants of Health   Financial Resource Strain: Not on file  Food Insecurity: Not on file  Transportation Needs: Not on file  Physical Activity: Not on file  Stress: Not on file  Social Connections: Not on file  Intimate Partner Violence: Not on file      Review of systems: All other review of systems negative except as mentioned in the HPI.   Physical Exam: Vitals:   05/28/21 0958  BP: 128/78  Pulse: 72   Body mass index is 39.45 kg/m. Gen:      No acute distress HEENT:  sclera anicteric Abd:      soft, non-tender; no palpable masses, no distension Ext:    No edema Neuro: alert and oriented x 3 Psych: normal mood and affect  Data Reviewed:  Reviewed labs, radiology imaging, old records and pertinent past GI work up   Assessment and Plan/Recommendations:  30 year old very pleasant female with history of Crohn's disease involving duodenum and distal ileum s/p terminal ileal resection with ileostomy and subsequent takedown of ileostomy with ileocolonic anastomosis   Complains of epigastric abdominal pain: We will need to exclude recurrent Crohn's flare, involvement of small bowel Follow up CT enterography abd & pelvis Continue Budesonide Start budesonide 9 mg daily X 30 additonal days, followed by taper dose 6 mg daily 2 weeks and 3 mg daily X 2 weeks   Iron deficiency anemia: continue oral iron, advised patient to use chelated iron capsule daily  Return in 3 months  The patient was provided an opportunity to ask questions and all were answered. The patient agreed with the plan and demonstrated an understanding of the instructions.  Damaris Hippo , MD    CC: Barrie Lyme, FNP

## 2021-05-28 NOTE — Patient Instructions (Signed)
You have been scheduled for a CT scan of the abdomen and pelvis at [facility], 1st floor Radiology. You are scheduled on 06/04/2021  at [place] should arrive 15 minutes prior to your appointment time for registration.  Nothing to eat or drink after midnight   If you have any questions regarding your exam or if you need to reschedule, you may call [facility] at [phone] between the hours of 8:00 am and 5:00 pm, Monday-Friday.      Budesonide Taper: 3m daily for 30 days then decrease to 6 mg for 2 weeks then decrease to 3 mg daily for 2 weeks  Start an Iron Supplement chelated iron capsule 18 mg daily   Follow up in 3 months  Your provider has requested that you go to the basement level for lab work before leaving today. Press "B" on the elevator. The lab is located at the first door on the left as you exit the elevator.   If you are age 8073or older, your body mass index should be between 23-30. Your Body mass index is 39.45 kg/m. If this is out of the aforementioned range listed, please consider follow up with your Primary Care Provider.  If you are age 8046or younger, your body mass index should be between 19-25. Your Body mass index is 39.45 kg/m. If this is out of the aformentioned range listed, please consider follow up with your Primary Care Provider.   ________________________________________________________  The [facility] providers would like to encourage you to use [facility] communicate with providers for non-urgent requests or questions.  Due to long hold times on the telephone, sending your provider a message by [facility] be a faster and more efficient way to get a response.  Please allow 48 business hours for a response.  Please remember that this is for non-urgent requests.  _______________________________________________________   Due to recent changes in healthcare laws, you may see the results of your imaging and laboratory studies on MyChart  before your provider has had a chance to review them.  We understand that in some cases there may be results that are confusing or concerning to you. Not all laboratory results come back in the same time frame and the provider may be waiting for multiple results in order to interpret others.  Please give [id number] hours in order for your provider to thoroughly review all the results before contacting the office for clarification of your results.    I appreciate the  opportunity to care for you  Thank You   KHarl Bowie, MD

## 2021-06-04 ENCOUNTER — Other Ambulatory Visit: Payer: Self-pay

## 2021-06-04 ENCOUNTER — Encounter (HOSPITAL_COMMUNITY): Payer: Self-pay

## 2021-06-04 ENCOUNTER — Ambulatory Visit (HOSPITAL_COMMUNITY)
Admission: RE | Admit: 2021-06-04 | Discharge: 2021-06-04 | Disposition: A | Discharge status: Home / Self Care | Payer: BC Managed Care – PPO | Source: Ambulatory Visit | Attending: Gastroenterology | Admitting: Gastroenterology

## 2021-06-04 DIAGNOSIS — K50818 Crohn's disease of both small and large intestine with other complication: Secondary | ICD-10-CM | POA: Diagnosis present

## 2021-06-04 DIAGNOSIS — K5229 Other allergic and dietetic gastroenteritis and colitis: Secondary | ICD-10-CM

## 2021-06-04 DIAGNOSIS — R1013 Epigastric pain: Secondary | ICD-10-CM | POA: Diagnosis present

## 2021-06-04 MED ORDER — BARIUM SULFATE 0.1 % PO SUSP: 450.0000 mL | Freq: Once | ORAL | Status: AC

## 2021-06-04 MED ORDER — IOHEXOL 350 MG/ML SOLN
75.0000 mL | Freq: Once | INTRAVENOUS | Status: AC | PRN
  Administered 2021-06-04: 75 mL via INTRAVENOUS

## 2021-06-04 MED ORDER — SODIUM CHLORIDE (PF) 0.9 % IJ SOLN
INTRAMUSCULAR | Status: AC
  Filled 2021-06-04: qty 50

## 2021-06-04 MED ORDER — BARIUM SULFATE 0.1 % PO SUSP
450.0000 mL | Freq: Once | ORAL | Status: AC
  Administered 2021-06-04: 450 mL via ORAL

## 2021-06-04 MED ORDER — BARIUM SULFATE 0.1 % PO SUSP
ORAL | Status: AC
  Administered 2021-06-04: 450 mL via ORAL
  Filled 2021-06-04: qty 2

## 2021-06-04 MED ORDER — BARIUM SULFATE 0.1 % PO SUSP
ORAL | Status: AC
  Administered 2021-06-04: 450 mL via ORAL
  Filled 2021-06-04: qty 1

## 2021-06-12 ENCOUNTER — Encounter: Payer: Self-pay | Admitting: Gastroenterology

## 2024-05-06 ENCOUNTER — Other Ambulatory Visit: Payer: Self-pay

## 2024-05-06 ENCOUNTER — Encounter (HOSPITAL_BASED_OUTPATIENT_CLINIC_OR_DEPARTMENT_OTHER): Payer: Self-pay | Admitting: Emergency Medicine

## 2024-05-06 ENCOUNTER — Emergency Department (HOSPITAL_BASED_OUTPATIENT_CLINIC_OR_DEPARTMENT_OTHER)
Admission: EM | Admit: 2024-05-06 | Discharge: 2024-05-06 | Disposition: A | Discharge status: Home / Self Care | Attending: Emergency Medicine | Admitting: Emergency Medicine

## 2024-05-06 DIAGNOSIS — R519 Headache, unspecified: Secondary | ICD-10-CM | POA: Diagnosis not present

## 2024-05-06 DIAGNOSIS — J45909 Unspecified asthma, uncomplicated: Secondary | ICD-10-CM | POA: Diagnosis not present

## 2024-05-06 DIAGNOSIS — L989 Disorder of the skin and subcutaneous tissue, unspecified: Secondary | ICD-10-CM | POA: Diagnosis present

## 2024-05-06 LAB — PREGNANCY, URINE: Preg Test, Ur: NEGATIVE

## 2024-05-06 MED ORDER — KETOROLAC TROMETHAMINE 15 MG/ML IJ SOLN
15.0000 mg | Freq: Once | INTRAMUSCULAR | Status: AC
  Administered 2024-05-06: 15 mg via INTRAMUSCULAR
  Filled 2024-05-06: qty 1

## 2024-05-06 MED ORDER — SULFAMETHOXAZOLE-TRIMETHOPRIM 800-160 MG PO TABS
1.0000 | ORAL_TABLET | Freq: Two times a day (BID) | ORAL | 0 refills | Status: AC
Start: 2024-05-06 — End: 2024-05-11

## 2024-05-06 MED ORDER — CEPHALEXIN 500 MG PO CAPS: 500.0000 mg | ORAL_CAPSULE | Freq: Four times a day (QID) | ORAL | 0 refills | Status: AC

## 2024-05-06 NOTE — ED Provider Notes (Signed)
 [facility] Provider Note   CSN: [id number] Arrival date & time: 05/06/24  1736     Patient presents with: Wound Check   [name] is a 33 y.o. female.   Wound Check Associated symptoms include headaches.  Patient is a 33 year old female present in the ED today for concerns for lesion to inferior aspect of left periorbital region.  Noted to have had it initially been a black dot that then began to become painful acutely 2 days ago.  Noting that the black dot had been there for approximately 1 month prior.  Endorses mild pulsatile right-sided headache.  Previous medical history of asthma, Crohn's disease, anemia requiring transfusions, esophagitis, HSV, SBO.  Denies fever, blurry vision, vertigo, tinnitus, eye pain, decreased EOM, pain with EOM, nausea, vomiting, unilateral weakness.     Prior to Admission medications   Medication Sig Start Date End Date Taking? Authorizing Provider  cephALEXin  (KEFLEX ) 500 MG capsule Take 1 capsule (500 mg total) by mouth 4 (four) times daily. 05/06/24  Yes Zidane Renner S, PA-C  sulfamethoxazole -trimethoprim  (BACTRIM  DS) 800-160 MG tablet Take 1 tablet by mouth 2 (two) times daily for 5 days. 05/06/24 05/11/24 Yes Athalia Setterlund S, PA-C  acetaminophen  (TYLENOL ) 500 MG tablet Take 1,000 mg by mouth every 6 (six) hours as needed for mild pain or headache.    [provider]  budesonide  (ENTOCORT EC ) 3 MG 24 hr capsule Take 3 capsules (9 mg total) by mouth daily. 04/05/21   Nandigam, Kavitha V, MD  ELDERBERRY PO Take 1 tablet by mouth daily.    [provider]  ibuprofen  (ADVIL ) 200 MG tablet Take 400 mg by mouth every 6 (six) hours as needed for mild pain.    [provider]  Multiple Vitamins-Minerals (AIRBORNE PO) Take 1 tablet by mouth daily. Patient not taking: Reported on 04/12/2021    [provider]    Allergies: Iron dextran, Ciprocinonide  [fluocinolone], Ciprofloxacin , and Reglan  [metoclopramide ]    Review of Systems  Skin:        Periorbital lesion  Neurological:  Positive for headaches.  All other systems reviewed and are negative.   Updated Vital Signs BP (!) 143/104 (BP Location: Left Arm)   Pulse (!) 104   Temp 98.1 F (36.7 C)   Resp 16   Ht 5' 4 (1.626 m)   Wt 99.8 kg   SpO2 99%   BMI 37.76 kg/m   Physical Exam Vitals and nursing note reviewed.  Constitutional:      General: She is not in acute distress.    Appearance: Normal appearance. She is not ill-appearing or diaphoretic.  HENT:     Head: Atraumatic.     Mouth/Throat:     Mouth: Mucous membranes are moist.     Pharynx: Oropharynx is clear.  Eyes:     General: No scleral icterus.       Right eye: No discharge.        Left eye: No discharge.     Extraocular Movements: Extraocular movements intact.     Conjunctiva/sclera: Conjunctivae normal.     Pupils: Pupils are equal, round, and reactive to light.  Cardiovascular:     Rate and Rhythm: Normal rate and regular rhythm.     Pulses: Normal pulses.     Heart sounds: Normal heart sounds. No murmur heard.    No friction rub. No gallop.  Pulmonary:     Effort: Pulmonary effort is normal.  No respiratory distress.     Breath sounds: No stridor. No wheezing, rhonchi or rales.  Chest:     Chest wall: No tenderness.  Abdominal:     General: Abdomen is flat. There is no distension.     Palpations: Abdomen is soft.     Tenderness: There is no abdominal tenderness. There is no right CVA tenderness, left CVA tenderness, guarding or rebound.  Musculoskeletal:        General: No swelling, deformity or signs of injury.     Cervical back: Normal range of motion. No rigidity.     Right lower leg: No edema.     Left lower leg: No edema.  Skin:    General: Skin is warm and dry.     Findings: Lesion present. No bruising or erythema.     Comments: Lesion noted to periorbital region on right eye  inferiorly, with picture noted in chart  Neurological:     General: No focal deficit present.     Mental Status: She is alert and oriented to person, place, and time. Mental status is at baseline.     Sensory: No sensory deficit.     Motor: No weakness.  Psychiatric:        Mood and Affect: Mood normal.     (all labs ordered are listed, but only abnormal results are displayed) Labs Reviewed  PREGNANCY, URINE    EKG: None  Radiology: No results found.   Procedures   Medications Ordered in the ED  ketorolac  (TORADOL ) 15 MG/ML injection 15 mg (15 mg Intramuscular Given 05/06/24 2037)   Medical Decision Making Amount and/or Complexity of Data Reviewed Labs: ordered.  Risk Prescription drug management.   This patient is a 33 year old female who presents to the ED for concern of lesion to right lower periorbital region that presented acutely 2 days ago, noting to have been a black spot 1 month prior before becoming acutely inflamed.  On physical exam, patient is in no acute distress, afebrile, alert and orient x 4, speaking in full sentences, nontachypneic, nontachycardic.  LCTAB, RRR, no murmur, EOM intact, no injection, no chemosis, no proptosis.  PERRL.  Normal neuroexam.  No light sensitivity  Spoke with attending who wished for her to be followed up with ENT and dermatology for excision.  As well as putting her on Keflex  and Bactrim   Provided Toradol  for pain and headache, with relief of symptoms.  Will have her continue to follow-up with both dermatology and ENT for whoever can see her first for biopsy/excision of the lesion.  Patient is requesting to go home at this time.  Provide 2 antibiotics for her to take.  Patient vital signs have remained stable throughout the course of patient's time in the ED. Low suspicion for any other emergent pathology at this time. I believe this patient is safe to be discharged. Provided strict return to ER precautions. Patient  expressed agreement and understanding of plan. All questions were answered.  Differential diagnoses prior to evaluation: The emergent differential diagnosis includes, but is not limited to, abscess, cancer, periorbital cellulitis, orbital cellulitis, dacryocystitis HSV. This is not an exhaustive differential.   Past Medical History / Co-morbidities / Social History: asthma, Crohn's disease, anemia requiring transfusions, esophagitis, HSV, SBO.  Additional history: Chart reviewed. Pertinent results include: Last seen by PCP on 01/13/2024.  Lab Tests/Imaging studies: I personally interpreted labs/imaging and the pertinent results include:    Urine pregnancy test negative.    Medications: I ordered  medication including TMP-SMX, flex.  I have reviewed the patients home medicines and have made adjustments as needed.  Critical Interventions: None  Social Determinants of Health: None  Disposition: After consideration of the diagnostic results and the patients response to treatment, I feel that the patient would benefit from discharge and treatment as above.   emergency department workup does not suggest an emergent condition requiring admission or immediate intervention beyond what has been performed at this time. The plan is: Follow-up with dermatology or ENT, antibiotics at home, return to the ER for new or worsening symptoms. The patient is safe for discharge and has been instructed to return immediately for worsening symptoms, change in symptoms or any other concerns.  Final diagnoses:  Skin lesion    ED Discharge Orders          Ordered    sulfamethoxazole -trimethoprim  (BACTRIM  DS) 800-160 MG tablet  2 times daily        05/06/24 2031    cephALEXin  (KEFLEX ) 500 MG capsule  4 times daily        05/06/24 2031               Beola Terrall RAMAN, [place] 05/06/24 2048    Lenor Hollering, MD 05/06/24 [phone]

## 2024-05-06 NOTE — ED Triage Notes (Signed)
 Pt with small possible abscess under RT eye x 2d; sts a tiny blackhead was there x 1 mo prior; some periorbital edema present

## 2024-05-06 NOTE — Discharge Instructions (Addendum)
 You were seen today for lesion noted underneath your right eye.  Suspicious that this may be infected, would recommend you continue to follow-up with ENT and/or dermatology for whoever is able to see you soonest for evaluation.  As this will likely need to be excised.  Will have you take antibiotics at this time.  I am prescribing 2 antibiotics, you will need to finish full courses of both.  Recommend you continue taking a probiotic as this can cause stomach upset as well as taking this with food to help prevent upset.  Return to the ER for any new or worsening symptoms which include fever, worsening vision, pain when you move your eye, worsening swelling, uncontrollable headache.

## 2024-06-11 ENCOUNTER — Encounter: Payer: Self-pay | Admitting: Gastroenterology
